# Patient Record
Sex: Female | Born: 1935 | Race: White | Hispanic: No | Smoking: Former smoker
Health system: Southern US, Community
[De-identification: ages and names within clinical notes are randomized; demographics above are authoritative.]

## PROBLEM LIST (undated history)

## (undated) DIAGNOSIS — C569 Malignant neoplasm of unspecified ovary: Secondary | ICD-10-CM

## (undated) DIAGNOSIS — Z87442 Personal history of urinary calculi: Secondary | ICD-10-CM

## (undated) DIAGNOSIS — R011 Cardiac murmur, unspecified: Secondary | ICD-10-CM

## (undated) DIAGNOSIS — I2699 Other pulmonary embolism without acute cor pulmonale: Secondary | ICD-10-CM

## (undated) DIAGNOSIS — F419 Anxiety disorder, unspecified: Secondary | ICD-10-CM

## (undated) DIAGNOSIS — Z950 Presence of cardiac pacemaker: Secondary | ICD-10-CM

## (undated) DIAGNOSIS — Z952 Presence of prosthetic heart valve: Secondary | ICD-10-CM

## (undated) DIAGNOSIS — M1990 Unspecified osteoarthritis, unspecified site: Secondary | ICD-10-CM

## (undated) DIAGNOSIS — I050 Rheumatic mitral stenosis: Secondary | ICD-10-CM

## (undated) DIAGNOSIS — Z9221 Personal history of antineoplastic chemotherapy: Secondary | ICD-10-CM

## (undated) DIAGNOSIS — I10 Essential (primary) hypertension: Secondary | ICD-10-CM

## (undated) DIAGNOSIS — M4800 Spinal stenosis, site unspecified: Secondary | ICD-10-CM

## (undated) DIAGNOSIS — I350 Nonrheumatic aortic (valve) stenosis: Secondary | ICD-10-CM

## (undated) DIAGNOSIS — C50919 Malignant neoplasm of unspecified site of unspecified female breast: Secondary | ICD-10-CM

## (undated) DIAGNOSIS — R0600 Dyspnea, unspecified: Secondary | ICD-10-CM

## (undated) HISTORY — PX: LITHOTRIPSY: SUR834

## (undated) HISTORY — PX: CARDIAC CATHETERIZATION: SHX172

## (undated) HISTORY — DX: Unspecified osteoarthritis, unspecified site: M19.90

## (undated) HISTORY — PX: INSERT / REPLACE / REMOVE PACEMAKER: SUR710

## (undated) HISTORY — PX: TONSILLECTOMY: SUR1361

## (undated) HISTORY — DX: Essential (primary) hypertension: I10

## (undated) HISTORY — DX: Rheumatic mitral stenosis: I05.0

## (undated) HISTORY — DX: Malignant neoplasm of unspecified site of unspecified female breast: C50.919

## (undated) HISTORY — DX: Nonrheumatic aortic (valve) stenosis: I35.0

---

## 1998-12-24 ENCOUNTER — Encounter: Admission: RE | Admit: 1998-12-24 | Discharge: 1998-12-24 | Payer: Self-pay | Admitting: *Deleted

## 1998-12-24 ENCOUNTER — Encounter: Payer: Self-pay | Admitting: *Deleted

## 1999-12-26 ENCOUNTER — Encounter: Admission: RE | Admit: 1999-12-26 | Discharge: 1999-12-26 | Payer: Self-pay | Admitting: *Deleted

## 1999-12-26 ENCOUNTER — Encounter: Payer: Self-pay | Admitting: *Deleted

## 2000-12-28 ENCOUNTER — Encounter: Payer: Self-pay | Admitting: *Deleted

## 2000-12-28 ENCOUNTER — Encounter: Admission: RE | Admit: 2000-12-28 | Discharge: 2000-12-28 | Payer: Self-pay | Admitting: *Deleted

## 2002-01-07 ENCOUNTER — Encounter: Admission: RE | Admit: 2002-01-07 | Discharge: 2002-01-07 | Payer: Self-pay | Admitting: *Deleted

## 2002-01-07 ENCOUNTER — Encounter: Payer: Self-pay | Admitting: *Deleted

## 2003-03-25 ENCOUNTER — Encounter: Admission: RE | Admit: 2003-03-25 | Discharge: 2003-03-25 | Payer: Self-pay | Admitting: *Deleted

## 2003-12-18 ENCOUNTER — Ambulatory Visit (HOSPITAL_BASED_OUTPATIENT_CLINIC_OR_DEPARTMENT_OTHER): Admission: RE | Admit: 2003-12-18 | Discharge: 2003-12-18 | Payer: Self-pay | Admitting: Urology

## 2004-01-11 ENCOUNTER — Encounter: Admission: RE | Admit: 2004-01-11 | Discharge: 2004-02-10 | Payer: Self-pay | Admitting: Internal Medicine

## 2004-01-21 ENCOUNTER — Other Ambulatory Visit: Admission: RE | Admit: 2004-01-21 | Discharge: 2004-01-21 | Payer: Self-pay | Admitting: *Deleted

## 2004-01-25 ENCOUNTER — Encounter: Admission: RE | Admit: 2004-01-25 | Discharge: 2004-01-25 | Payer: Self-pay | Admitting: *Deleted

## 2004-01-25 ENCOUNTER — Encounter (INDEPENDENT_AMBULATORY_CARE_PROVIDER_SITE_OTHER): Payer: Self-pay | Admitting: *Deleted

## 2004-02-04 ENCOUNTER — Encounter: Admission: RE | Admit: 2004-02-04 | Discharge: 2004-02-04 | Payer: Self-pay | Admitting: *Deleted

## 2004-02-05 ENCOUNTER — Encounter: Admission: RE | Admit: 2004-02-05 | Discharge: 2004-02-05 | Payer: Self-pay | Admitting: General Surgery

## 2004-02-18 ENCOUNTER — Encounter: Admission: RE | Admit: 2004-02-18 | Discharge: 2004-02-18 | Payer: Self-pay | Admitting: General Surgery

## 2004-03-06 HISTORY — PX: BREAST SURGERY: SHX581

## 2004-03-22 ENCOUNTER — Encounter: Admission: RE | Admit: 2004-03-22 | Discharge: 2004-03-22 | Payer: Self-pay | Admitting: General Surgery

## 2004-03-24 ENCOUNTER — Ambulatory Visit (HOSPITAL_COMMUNITY): Admission: RE | Admit: 2004-03-24 | Discharge: 2004-03-24 | Payer: Self-pay | Admitting: General Surgery

## 2004-03-24 ENCOUNTER — Encounter (INDEPENDENT_AMBULATORY_CARE_PROVIDER_SITE_OTHER): Payer: Self-pay | Admitting: *Deleted

## 2004-03-24 ENCOUNTER — Ambulatory Visit (HOSPITAL_BASED_OUTPATIENT_CLINIC_OR_DEPARTMENT_OTHER): Admission: RE | Admit: 2004-03-24 | Discharge: 2004-03-24 | Payer: Self-pay | Admitting: General Surgery

## 2004-04-04 ENCOUNTER — Ambulatory Visit: Payer: Self-pay | Admitting: Oncology

## 2004-04-14 ENCOUNTER — Ambulatory Visit (HOSPITAL_COMMUNITY): Admission: RE | Admit: 2004-04-14 | Discharge: 2004-04-14 | Payer: Self-pay | Admitting: Oncology

## 2004-05-03 ENCOUNTER — Ambulatory Visit (HOSPITAL_COMMUNITY): Admission: RE | Admit: 2004-05-03 | Discharge: 2004-05-03 | Payer: Self-pay | Admitting: Gastroenterology

## 2004-05-03 ENCOUNTER — Encounter (INDEPENDENT_AMBULATORY_CARE_PROVIDER_SITE_OTHER): Payer: Self-pay | Admitting: *Deleted

## 2004-05-03 ENCOUNTER — Ambulatory Visit: Admission: RE | Admit: 2004-05-03 | Discharge: 2004-07-03 | Payer: Self-pay | Admitting: Radiation Oncology

## 2004-05-05 ENCOUNTER — Encounter: Admission: RE | Admit: 2004-05-05 | Discharge: 2004-08-03 | Payer: Self-pay | Admitting: Internal Medicine

## 2004-05-12 ENCOUNTER — Ambulatory Visit (HOSPITAL_COMMUNITY): Admission: RE | Admit: 2004-05-12 | Discharge: 2004-05-12 | Payer: Self-pay | Admitting: Urology

## 2004-06-23 ENCOUNTER — Ambulatory Visit: Payer: Self-pay | Admitting: Oncology

## 2004-06-30 ENCOUNTER — Ambulatory Visit (HOSPITAL_COMMUNITY): Admission: RE | Admit: 2004-06-30 | Discharge: 2004-06-30 | Payer: Self-pay | Admitting: Urology

## 2004-07-22 ENCOUNTER — Ambulatory Visit (HOSPITAL_COMMUNITY): Admission: RE | Admit: 2004-07-22 | Discharge: 2004-07-22 | Payer: Self-pay | Admitting: Urology

## 2004-07-22 ENCOUNTER — Ambulatory Visit (HOSPITAL_BASED_OUTPATIENT_CLINIC_OR_DEPARTMENT_OTHER): Admission: RE | Admit: 2004-07-22 | Discharge: 2004-07-22 | Payer: Self-pay | Admitting: Urology

## 2004-07-27 ENCOUNTER — Ambulatory Visit: Admission: RE | Admit: 2004-07-27 | Discharge: 2004-07-27 | Payer: Self-pay | Admitting: Radiation Oncology

## 2004-09-16 ENCOUNTER — Ambulatory Visit: Payer: Self-pay | Admitting: Oncology

## 2004-10-03 ENCOUNTER — Ambulatory Visit (HOSPITAL_COMMUNITY): Admission: RE | Admit: 2004-10-03 | Discharge: 2004-10-03 | Payer: Self-pay | Admitting: Urology

## 2004-10-03 ENCOUNTER — Ambulatory Visit (HOSPITAL_BASED_OUTPATIENT_CLINIC_OR_DEPARTMENT_OTHER): Admission: RE | Admit: 2004-10-03 | Discharge: 2004-10-03 | Payer: Self-pay | Admitting: Urology

## 2004-12-13 ENCOUNTER — Encounter: Admission: RE | Admit: 2004-12-13 | Discharge: 2004-12-13 | Payer: Self-pay | Admitting: Oncology

## 2005-01-13 ENCOUNTER — Ambulatory Visit: Payer: Self-pay | Admitting: Oncology

## 2005-02-23 ENCOUNTER — Other Ambulatory Visit: Admission: RE | Admit: 2005-02-23 | Discharge: 2005-02-23 | Payer: Self-pay | Admitting: *Deleted

## 2005-05-17 ENCOUNTER — Ambulatory Visit: Payer: Self-pay | Admitting: Oncology

## 2005-06-10 ENCOUNTER — Emergency Department (HOSPITAL_COMMUNITY): Admission: EM | Admit: 2005-06-10 | Discharge: 2005-06-10 | Payer: Self-pay | Admitting: Emergency Medicine

## 2005-07-04 ENCOUNTER — Ambulatory Visit: Payer: Self-pay | Admitting: Oncology

## 2005-08-30 ENCOUNTER — Ambulatory Visit: Payer: Self-pay | Admitting: Oncology

## 2005-09-07 LAB — CBC WITH DIFFERENTIAL/PLATELET
Eosinophils Absolute: 0.2 10*3/uL (ref 0.0–0.5)
LYMPH%: 34.9 % (ref 14.0–48.0)
MCH: 31.1 pg (ref 26.0–34.0)
MCHC: 34.1 g/dL (ref 32.0–36.0)
MCV: 91 fL (ref 81.0–101.0)
MONO%: 10.9 % (ref 0.0–13.0)
NEUT#: 3 10*3/uL (ref 1.5–6.5)
Platelets: 220 10*3/uL (ref 145–400)
RBC: 4.19 10*6/uL (ref 3.70–5.32)

## 2005-09-07 LAB — COMPREHENSIVE METABOLIC PANEL
ALT: 21 U/L (ref 0–40)
BUN: 21 mg/dL (ref 6–23)
CO2: 23 mEq/L (ref 19–32)
Calcium: 9.3 mg/dL (ref 8.4–10.5)
Chloride: 107 mEq/L (ref 96–112)
Creatinine, Ser: 0.71 mg/dL (ref 0.40–1.20)
Total Bilirubin: 0.4 mg/dL (ref 0.3–1.2)

## 2005-09-07 LAB — CANCER ANTIGEN 27.29: CA 27.29: 23 U/mL (ref 0–39)

## 2005-12-14 ENCOUNTER — Encounter: Admission: RE | Admit: 2005-12-14 | Discharge: 2005-12-14 | Payer: Self-pay | Admitting: Oncology

## 2006-01-17 ENCOUNTER — Ambulatory Visit: Payer: Self-pay | Admitting: Oncology

## 2006-01-19 LAB — COMPREHENSIVE METABOLIC PANEL
ALT: 29 U/L (ref 0–35)
AST: 23 U/L (ref 0–37)
Albumin: 4.5 g/dL (ref 3.5–5.2)
Calcium: 9.6 mg/dL (ref 8.4–10.5)
Chloride: 105 mEq/L (ref 96–112)
Potassium: 3.8 mEq/L (ref 3.5–5.3)
Sodium: 140 mEq/L (ref 135–145)
Total Protein: 7 g/dL (ref 6.0–8.3)

## 2006-01-19 LAB — CBC WITH DIFFERENTIAL/PLATELET
BASO%: 0.4 % (ref 0.0–2.0)
EOS%: 3.5 % (ref 0.0–7.0)
HGB: 13.4 g/dL (ref 11.6–15.9)
MCH: 31.8 pg (ref 26.0–34.0)
MCHC: 34.5 g/dL (ref 32.0–36.0)
MONO#: 0.6 10*3/uL (ref 0.1–0.9)
RDW: 13.1 % (ref 11.3–14.5)
WBC: 5.6 10*3/uL (ref 3.9–10.0)
lymph#: 1.9 10*3/uL (ref 0.9–3.3)

## 2006-04-13 ENCOUNTER — Other Ambulatory Visit: Admission: RE | Admit: 2006-04-13 | Discharge: 2006-04-13 | Payer: Self-pay | Admitting: *Deleted

## 2006-08-01 ENCOUNTER — Ambulatory Visit: Payer: Self-pay | Admitting: Oncology

## 2006-08-03 LAB — COMPREHENSIVE METABOLIC PANEL
Albumin: 4.2 g/dL (ref 3.5–5.2)
Alkaline Phosphatase: 75 U/L (ref 39–117)
BUN: 18 mg/dL (ref 6–23)
CO2: 25 mEq/L (ref 19–32)
Calcium: 9.7 mg/dL (ref 8.4–10.5)
Chloride: 103 mEq/L (ref 96–112)
Glucose, Bld: 169 mg/dL — ABNORMAL HIGH (ref 70–99)
Potassium: 3.9 mEq/L (ref 3.5–5.3)
Sodium: 141 mEq/L (ref 135–145)
Total Protein: 6.8 g/dL (ref 6.0–8.3)

## 2006-08-03 LAB — CBC WITH DIFFERENTIAL/PLATELET
Basophils Absolute: 0 10*3/uL (ref 0.0–0.1)
Eosinophils Absolute: 0.2 10*3/uL (ref 0.0–0.5)
HGB: 13.4 g/dL (ref 11.6–15.9)
MCV: 91.4 fL (ref 81.0–101.0)
MONO#: 0.8 10*3/uL (ref 0.1–0.9)
MONO%: 10.9 % (ref 0.0–13.0)
NEUT#: 3.8 10*3/uL (ref 1.5–6.5)
RBC: 4.19 10*6/uL (ref 3.70–5.32)
RDW: 13.5 % (ref 11.3–14.5)
WBC: 7.3 10*3/uL (ref 3.9–10.0)
lymph#: 2.4 10*3/uL (ref 0.9–3.3)

## 2006-08-03 LAB — LACTATE DEHYDROGENASE: LDH: 209 U/L (ref 94–250)

## 2006-12-17 ENCOUNTER — Encounter: Admission: RE | Admit: 2006-12-17 | Discharge: 2006-12-17 | Payer: Self-pay | Admitting: Oncology

## 2006-12-21 ENCOUNTER — Encounter: Admission: RE | Admit: 2006-12-21 | Discharge: 2006-12-21 | Payer: Self-pay | Admitting: Oncology

## 2007-01-28 ENCOUNTER — Ambulatory Visit: Payer: Self-pay | Admitting: Oncology

## 2007-02-01 LAB — COMPREHENSIVE METABOLIC PANEL
Albumin: 4.1 g/dL (ref 3.5–5.2)
CO2: 25 mEq/L (ref 19–32)
Chloride: 106 mEq/L (ref 96–112)
Glucose, Bld: 202 mg/dL — ABNORMAL HIGH (ref 70–99)
Potassium: 4.4 mEq/L (ref 3.5–5.3)
Sodium: 142 mEq/L (ref 135–145)
Total Protein: 6.3 g/dL (ref 6.0–8.3)

## 2007-02-01 LAB — CBC WITH DIFFERENTIAL/PLATELET
Eosinophils Absolute: 0.4 10*3/uL (ref 0.0–0.5)
LYMPH%: 32.8 % (ref 14.0–48.0)
MONO#: 1 10*3/uL — ABNORMAL HIGH (ref 0.1–0.9)
NEUT#: 3.8 10*3/uL (ref 1.5–6.5)
Platelets: 238 10*3/uL (ref 145–400)
RBC: 4.18 10*6/uL (ref 3.70–5.32)
RDW: 13.1 % (ref 11.3–14.5)
WBC: 7.7 10*3/uL (ref 3.9–10.0)
lymph#: 2.5 10*3/uL (ref 0.9–3.3)

## 2007-02-01 LAB — CANCER ANTIGEN 27.29: CA 27.29: 15 U/mL (ref 0–39)

## 2007-02-01 LAB — LACTATE DEHYDROGENASE: LDH: 198 U/L (ref 94–250)

## 2007-04-18 ENCOUNTER — Other Ambulatory Visit: Admission: RE | Admit: 2007-04-18 | Discharge: 2007-04-18 | Payer: Self-pay | Admitting: Family Medicine

## 2007-07-31 ENCOUNTER — Ambulatory Visit: Payer: Self-pay | Admitting: Oncology

## 2007-12-18 ENCOUNTER — Encounter: Admission: RE | Admit: 2007-12-18 | Discharge: 2007-12-18 | Payer: Self-pay | Admitting: Oncology

## 2007-12-28 ENCOUNTER — Observation Stay (HOSPITAL_COMMUNITY): Admission: EM | Admit: 2007-12-28 | Discharge: 2007-12-29 | Payer: Self-pay | Admitting: Emergency Medicine

## 2008-01-15 ENCOUNTER — Ambulatory Visit (HOSPITAL_COMMUNITY): Admission: RE | Admit: 2008-01-15 | Discharge: 2008-01-16 | Payer: Self-pay | Admitting: Urology

## 2008-01-27 ENCOUNTER — Ambulatory Visit (HOSPITAL_COMMUNITY): Admission: RE | Admit: 2008-01-27 | Discharge: 2008-01-27 | Payer: Self-pay | Admitting: Urology

## 2008-02-12 ENCOUNTER — Ambulatory Visit (HOSPITAL_COMMUNITY): Admission: RE | Admit: 2008-02-12 | Discharge: 2008-02-13 | Payer: Self-pay | Admitting: Urology

## 2008-02-14 ENCOUNTER — Ambulatory Visit: Payer: Self-pay | Admitting: Oncology

## 2008-02-18 LAB — CBC WITH DIFFERENTIAL/PLATELET
Basophils Absolute: 0 10*3/uL (ref 0.0–0.1)
EOS%: 6.9 % (ref 0.0–7.0)
HCT: 32.5 % — ABNORMAL LOW (ref 34.8–46.6)
HGB: 11.1 g/dL — ABNORMAL LOW (ref 11.6–15.9)
LYMPH%: 34.6 % (ref 14.0–48.0)
MCH: 30.4 pg (ref 26.0–34.0)
MCV: 89.2 fL (ref 81.0–101.0)
MONO%: 11.7 % (ref 0.0–13.0)
NEUT%: 46.1 % (ref 39.6–76.8)
Platelets: 271 10*3/uL (ref 145–400)
RDW: 13.6 % (ref 11.3–14.5)

## 2008-02-19 LAB — CANCER ANTIGEN 27.29: CA 27.29: 14 U/mL (ref 0–39)

## 2008-02-19 LAB — COMPREHENSIVE METABOLIC PANEL
AST: 17 U/L (ref 0–37)
Alkaline Phosphatase: 61 U/L (ref 39–117)
BUN: 13 mg/dL (ref 6–23)
Creatinine, Ser: 0.81 mg/dL (ref 0.40–1.20)
Total Bilirubin: 0.9 mg/dL (ref 0.3–1.2)

## 2008-02-19 LAB — VITAMIN D 25 HYDROXY (VIT D DEFICIENCY, FRACTURES): Vit D, 25-Hydroxy: 32 ng/mL (ref 30–89)

## 2008-03-09 ENCOUNTER — Ambulatory Visit (HOSPITAL_COMMUNITY): Admission: RE | Admit: 2008-03-09 | Discharge: 2008-03-09 | Payer: Self-pay | Admitting: Urology

## 2008-09-15 ENCOUNTER — Ambulatory Visit: Payer: Self-pay | Admitting: Oncology

## 2008-09-17 LAB — CBC WITH DIFFERENTIAL/PLATELET
BASO%: 0.5 % (ref 0.0–2.0)
EOS%: 3.2 % (ref 0.0–7.0)
HCT: 38.3 % (ref 34.8–46.6)
MCH: 32.2 pg (ref 25.1–34.0)
MCHC: 34.8 g/dL (ref 31.5–36.0)
NEUT%: 43.1 % (ref 38.4–76.8)
RBC: 4.14 10*6/uL (ref 3.70–5.45)
lymph#: 2.8 10*3/uL (ref 0.9–3.3)

## 2008-09-18 LAB — COMPREHENSIVE METABOLIC PANEL
AST: 38 U/L — ABNORMAL HIGH (ref 0–37)
Albumin: 4.2 g/dL (ref 3.5–5.2)
Alkaline Phosphatase: 84 U/L (ref 39–117)
BUN: 13 mg/dL (ref 6–23)
Creatinine, Ser: 0.77 mg/dL (ref 0.40–1.20)
Potassium: 4 mEq/L (ref 3.5–5.3)

## 2008-12-18 ENCOUNTER — Encounter: Admission: RE | Admit: 2008-12-18 | Discharge: 2008-12-18 | Payer: Self-pay | Admitting: Oncology

## 2009-03-19 ENCOUNTER — Ambulatory Visit: Payer: Self-pay | Admitting: Oncology

## 2009-03-23 LAB — CBC WITH DIFFERENTIAL/PLATELET
Basophils Absolute: 0 10*3/uL (ref 0.0–0.1)
EOS%: 2.8 % (ref 0.0–7.0)
Eosinophils Absolute: 0.2 10*3/uL (ref 0.0–0.5)
HCT: 41.7 % (ref 34.8–46.6)
HGB: 14.2 g/dL (ref 11.6–15.9)
MCH: 31.9 pg (ref 25.1–34.0)
MCHC: 34 g/dL (ref 31.5–36.0)
MCV: 93.8 fL (ref 79.5–101.0)
MONO%: 11.2 % (ref 0.0–14.0)
RBC: 4.45 10*6/uL (ref 3.70–5.45)
RDW: 13.5 % (ref 11.2–14.5)

## 2009-03-23 LAB — VITAMIN D 25 HYDROXY (VIT D DEFICIENCY, FRACTURES): Vit D, 25-Hydroxy: 36 ng/mL (ref 30–89)

## 2009-03-23 LAB — COMPREHENSIVE METABOLIC PANEL
Albumin: 4.6 g/dL (ref 3.5–5.2)
Alkaline Phosphatase: 90 U/L (ref 39–117)
BUN: 17 mg/dL (ref 6–23)
Calcium: 10.1 mg/dL (ref 8.4–10.5)
Creatinine, Ser: 0.89 mg/dL (ref 0.40–1.20)
Total Bilirubin: 0.7 mg/dL (ref 0.3–1.2)
Total Protein: 7.3 g/dL (ref 6.0–8.3)

## 2009-05-20 ENCOUNTER — Other Ambulatory Visit: Admission: RE | Admit: 2009-05-20 | Discharge: 2009-05-20 | Payer: Self-pay | Admitting: Obstetrics and Gynecology

## 2009-11-29 ENCOUNTER — Encounter
Admission: RE | Admit: 2009-11-29 | Discharge: 2010-02-27 | Payer: Self-pay | Source: Home / Self Care | Attending: Family Medicine | Admitting: Family Medicine

## 2009-12-20 ENCOUNTER — Encounter: Admission: RE | Admit: 2009-12-20 | Discharge: 2009-12-20 | Payer: Self-pay | Admitting: Oncology

## 2010-03-21 ENCOUNTER — Ambulatory Visit: Payer: Self-pay | Admitting: Oncology

## 2010-03-23 LAB — CBC WITH DIFFERENTIAL/PLATELET
BASO%: 0.3 % (ref 0.0–2.0)
Basophils Absolute: 0 10*3/uL (ref 0.0–0.1)
EOS%: 4 % (ref 0.0–7.0)
Eosinophils Absolute: 0.3 10*3/uL (ref 0.0–0.5)
HCT: 39.5 % (ref 34.8–46.6)
HGB: 13.5 g/dL (ref 11.6–15.9)
LYMPH%: 36.5 % (ref 14.0–49.7)
MCH: 32.1 pg (ref 25.1–34.0)
MCHC: 34.2 g/dL (ref 31.5–36.0)
MCV: 93.9 fL (ref 79.5–101.0)
MONO#: 0.9 10*3/uL (ref 0.1–0.9)
MONO%: 11.5 % (ref 0.0–14.0)
NEUT#: 3.7 10*3/uL (ref 1.5–6.5)
NEUT%: 47.7 % (ref 38.4–76.8)
Platelets: 195 10*3/uL (ref 145–400)
RBC: 4.21 10*6/uL (ref 3.70–5.45)
RDW: 13.7 % (ref 11.2–14.5)
WBC: 7.7 10*3/uL (ref 3.9–10.3)
lymph#: 2.8 10*3/uL (ref 0.9–3.3)

## 2010-03-24 LAB — COMPREHENSIVE METABOLIC PANEL
ALT: 29 U/L (ref 0–35)
AST: 28 U/L (ref 0–37)
Albumin: 4.3 g/dL (ref 3.5–5.2)
Alkaline Phosphatase: 79 U/L (ref 39–117)
BUN: 15 mg/dL (ref 6–23)
CO2: 27 mEq/L (ref 19–32)
Calcium: 9.5 mg/dL (ref 8.4–10.5)
Chloride: 106 mEq/L (ref 96–112)
Creatinine, Ser: 0.8 mg/dL (ref 0.40–1.20)
Glucose, Bld: 134 mg/dL — ABNORMAL HIGH (ref 70–99)
Potassium: 3.9 mEq/L (ref 3.5–5.3)
Sodium: 143 mEq/L (ref 135–145)
Total Bilirubin: 0.7 mg/dL (ref 0.3–1.2)
Total Protein: 6.7 g/dL (ref 6.0–8.3)

## 2010-03-24 LAB — LACTATE DEHYDROGENASE: LDH: 189 U/L (ref 94–250)

## 2010-03-24 LAB — VITAMIN D 25 HYDROXY (VIT D DEFICIENCY, FRACTURES): Vit D, 25-Hydroxy: 38 ng/mL (ref 30–89)

## 2010-03-24 LAB — CANCER ANTIGEN 27.29: CA 27.29: 17 U/mL (ref 0–39)

## 2010-03-26 ENCOUNTER — Encounter: Payer: Self-pay | Admitting: *Deleted

## 2010-04-11 ENCOUNTER — Encounter (HOSPITAL_BASED_OUTPATIENT_CLINIC_OR_DEPARTMENT_OTHER): Payer: Self-pay | Admitting: Oncology

## 2010-04-11 ENCOUNTER — Other Ambulatory Visit: Payer: Self-pay | Admitting: Oncology

## 2010-04-11 DIAGNOSIS — E559 Vitamin D deficiency, unspecified: Secondary | ICD-10-CM

## 2010-04-11 DIAGNOSIS — Z170 Estrogen receptor positive status [ER+]: Secondary | ICD-10-CM

## 2010-04-11 DIAGNOSIS — C50219 Malignant neoplasm of upper-inner quadrant of unspecified female breast: Secondary | ICD-10-CM

## 2010-04-11 DIAGNOSIS — Z9889 Other specified postprocedural states: Secondary | ICD-10-CM

## 2010-04-11 DIAGNOSIS — Z853 Personal history of malignant neoplasm of breast: Secondary | ICD-10-CM

## 2010-06-20 LAB — GLUCOSE, CAPILLARY: Glucose-Capillary: 197 mg/dL — ABNORMAL HIGH (ref 70–99)

## 2010-07-19 NOTE — Op Note (Signed)
[name], [name]               ACCOUNT NO.:  [id number]   MEDICAL RECORD NO.:  [id number]          PATIENT TYPE:  OBV   LOCATION:  0098                         FACILITY:  [facility]   PHYSICIAN:  Excell Seltzer. Annabell Howells, M.D.    DATE OF BIRTH:  04/30/1935   DATE OF PROCEDURE:  12/28/2007  DATE OF DISCHARGE:                               OPERATIVE REPORT   PROCEDURE:  Cystoscopy, right retrograde pyelogram with interpretation,  insertion of right double-J stent.   PREOPERATIVE DIAGNOSIS:  Right ureteropelvic junction stone with urinary  tract infection.   POSTOPERATIVE DIAGNOSIS:  Right ureteropelvic junction stone with  urinary tract infection with pyelonephrosis.   ANESTHESIA:  General.   DRAINS:  6-French x 26 cm double-J stent.   COMPLICATIONS:  None.   INDICATIONS:  [name] is a 75 year old white female, patient of Dr.  Vernie Ammons, with a history of stones who presented to the emergency room  today with the onset yesterday of severe right flank pain.  CT scan in  the emergency room revealed a 2.3 cm right UPJ stone with some  obstruction and several additional stones in the right kidney that have  shown significant interval increase since November 2008.  There were  also two stones in the left kidney.  Her urine was nitrite positive  consistent with urinary tract infection and it was felt that cystoscopy  with stent placement was indicated to decompress the kidney.   FINDINGS AND PROCEDURE:  The patient was taken to the operating room  where a general anesthetic was induced.  She had been given Cipro in the  emergency room.  She was placed in the lithotomy position.  Her perineum  and genitalia were prepped with Betadine solution.  She was draped in  the usual sterile fashion.  Cystoscopy was performed using the 22-French  scope and 12 and 70 degrees lenses.  Examination revealed a normal  urethra.  The bladder wall had mild trabeculation.  The ureteral  orifices were unremarkable.   No tumors or stones were seen.  There was  some suggestion of chronic inflammation.   The right ureteral orifice was cannulated with a 5-French open-end  catheter and contrast was instilled.   The retrograde pyelogram demonstrated a normal ureter to the UPJ where  there was a large filling defect consistent with a stone seen on CT.  Contrast did flow into the kidney and exhibited mild hydronephrosis.     After completion of the retrograde pyelogram a censored dual core  guidewire was passed through the kidney through the open-end catheter  without difficulty.  The open-end catheter was removed and a 6-French 26  cm double-J stent was placed under fluoroscopic guidance to the kidney.  The wire was removed leaving a good coil in the kidney, a good coil in  the bladder.  Upon removal of the wire there was the efflux of bloody  turbid urine consistent  with infection from the stent.  The bladder was then drained, the  cystoscope was removed.  The patient was taken down from the lithotomy  position.  The anesthetic was  reversed.  She was moved to the recovery  room in stable condition.  There were no complications.      Excell Seltzer. Annabell Howells, M.D.  Electronically Signed     JJW/MEDQ  D:  12/28/2007  T:  12/28/2007  Job:  [id number]   cc:   Veverly Fells. Vernie Ammons, M.D.  Fax: [phone]   Dossie Der, MD  Fax: [phone]

## 2010-07-19 NOTE — Op Note (Signed)
[name], [name]                  ACCOUNT NO.:  [id number]   MEDICAL RECORD NO.:  [id number]          PATIENT TYPE:  OIB   LOCATION:  1419                         FACILITY:  [facility]   PHYSICIAN:  Mark C. Vernie Ammons, M.D.  DATE OF BIRTH:  02-09-1936   DATE OF PROCEDURE:  02/12/2008  DATE OF DISCHARGE:                               OPERATIVE REPORT   PREOPERATIVE DIAGNOSIS:  Right renal and ureteral calculi.   POSTOPERATIVE DIAGNOSIS:  Right renal and ureteral calculi.   PROCEDURES:  1. Cystoscopy with double-J stent removal.  2. Right nephroscopy.  3. Nephroscopic stone extraction.  4. Antegrade pyelogram with interpretation.  5. Right ureteroscopy.  6. Ureteroscopic stone extraction.  7. Right double-J stent placement (no string).   SURGEON:  Mark C. Vernie Ammons, M.D.   ANESTHESIA:  General.   SPECIMENS:  None.   BLOOD LOSS:  Approximately 100 mL.   DRAINS:  A 7-French 24-cm double-J stent in the right ureter and a 20-  French catheter as a nephrostomy tube.   COMPLICATIONS:  None.   INDICATIONS:  The patient is a 75 year old white female who had an  obstructing stone requiring a stent due to severe pain.  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  There was some stone in a parallel  calix and this was treated with lithotripsy, with excellent results, and  there were remaining fragments present within the collecting system, so  she is brought back to the operating room for repeat nephroscopy and  removal of those stones.  On her preop KUB I saw stones in the  collecting system as well as some stones along the course along the  stent in the ureter.   DESCRIPTION OF OPERATION:  After informed consent, the patient was taken  to the major OR and placed on the table, administered general anesthesia  and then moved to the prone position.  I removed her nephrostomy tube  and then prepped her flank.   Sterile drapes were applied and I then  performed nephroscopy.   The 17-French flexible cystoscope was used and passed down through the  nephrostomy tract into the kidney, where I was able to identify several  stones and grasped these with the nitinol basket and extracted each  individually.  I was able to then negotiate my way into what appeared to  be the renal pelvis or lower pole calix and found more stone.  As I  tried to grasp the stone fragments, they crushed and could not be easily  extracted.  Because they were so soft, I could not maintain them in the  basket.  I therefore fragmented the stone into sand grain size pieces by  grasping it with the nitinol basket and actually crushing the stone in  that fashion.  I then performed an antegrade nephrostogram.   Full-strength contrast was injected through the cystoscope and I noted  there was a slight amount of extravasation.  I could see the stent in  the lower portion of the renal  pelvis near the UPJ.   I removed the flexible scope and inserted the 22-French rigid cystoscope  and through that I was able to grasp more stone using a three-pronged  grasper.  After I had gotten all the large portions of stone completely  removed,  I removed that and inserted the 20-French Foley catheter under  direct fluoroscopy and filled the balloon with about 2 mL of contrast.  This was then taped into position and my feeling is it may need to be  left for some time in the recovery room in order to tamponade any  bleeding.   The patient was then moved back to the supine position and then moved to  dorsal lithotomy position and her genitalia was sterilely prepped and  draped.  I initially inserted a 22-French rigid scope and identified her  stent, grasped it with the alligator forceps and brought it out through  the urethral meatus.  Under direct fluoroscopy I then passed a guidewire  through the double-J stent and into the renal pelvis and  removed the  double-J stent, leaving the stent as a safety guidewire.  Next, the 6-  French rigid ureteroscope was then passed under direct vision into the  right orifice and alongside the guidewire up the ureter, where multiple  stone fragments were noted.  These were grasped and removed with a  nitinol basket.  I noted also there were multiple stone fragments seen  on the floor of the bladder as well.  I then removed the rigid  ureteroscope and as I did this, I passed a second guidewire and then  over the guidewire passed the flexible ureteroscope.  With the flexible  ureteroscope I could then inspect the kidney and I could not find any  large stones.  There was a lot of gravel-sized/sand grain-sized stone  too small to warrant attempted extraction.  Because there was still a  lot of sand-like material though after I removed a few more ureteral  fragments, I removed the flexible ureteroscope and elected to put in a  new double-J stent.   The safety guidewire was then back loaded on the cystoscope and the 7-  [place] double-J stent was passed over the guidewire into the area of the  renal pelvis, which was confirmed fluoroscopically.  I then removed the  guidewire, with good curl being noted in the renal pelvis and in the  bladder.  The bladder was then drained and the patient was awakened and  taken to the recovery room in stable satisfactory condition.  She  tolerated the procedure well.  There were no intraoperative  complications.  She will be monitored in the recovery room and I will  consider removal of her nephrostomy tube prior to her discharge.  She  will be maintained on Cipro and also has a prescription for pain  medicine and will return to my office in 2 weeks.      Mark C. Vernie Ammons, M.D.  Electronically Signed     MCO/MEDQ  D:  02/12/2008  T:  02/12/2008  Job:  [id number]

## 2010-07-19 NOTE — Consult Note (Signed)
[name], [name]               ACCOUNT NO.:  [id number]   MEDICAL RECORD NO.:  [id number]          PATIENT TYPE:  OBV   LOCATION:  0098                         FACILITY:  [facility]   PHYSICIAN:  Excell Seltzer. Annabell Howells, M.D.    DATE OF BIRTH:  January 05, 1936   DATE OF CONSULTATION:  DATE OF DISCHARGE:                                 CONSULTATION   Patient of Dr. Ihor Gully.   CHIEF COMPLAINT:  Right flank pain.   HISTORY:  [name] is a 75 year old white female patient of Dr. Ihor Gully with a history of stones who was last seen in our office in  November of 2008 at which point she had a single residual right mid  renal stone.  She had the onset last night of severe right flank pain  with nausea and hot flashes without documented fever.  Four to five  weeks ago she was noted to have an elevated glucose, a urine was  positive and she received antibiotics for a week.  She has no dysuria or  malodorous urine.  She denies hematuria or pain, is moderately relieved  with medications in the emergency room.  She had had no prior pain  episodes before last night but had been anxious for the past week.  In  the emergency room a CT scan revealed marked increase in the number of  renal stones with a 2.3 cm UPJ stone and evidence of a forming staghorn  calculus.  There are also some left renal stones noted as well.  Her  urinalysis is nitrite positive with too numerous to count white cells,  zero to two red cells and few bacteria.  White count is 8000 with a left  shift.  Chemistries are unremarkable.   On review of her past history, SHE HAS NO DRUG ALLERGIES.   CURRENT MEDICATIONS:  1. Benicar 40 mg daily.  2. Lipitor 20 mg daily.  3. Metformin 1500 mg in the morning and 1000 mg the evening.  4. Metoprolol 25 mg b.i.d.  5. Arimidex 1 mg q.a.m.  6. Fluoxetine 40 mg q.a.m.  7. Ambien 5 mg as needed.  8. Bayer aspirin 81 mg q.a.m.  9. Potassium chloride that daily.   MEDICAL HISTORY:  1.  Diabetes.  2. Hypertension.  3. History of breast cancer, treated with lumpectomy and radiation.  4. Arthritis.  5. Depression.  6. History of stones.   SURGICAL HISTORY:  1. Lumpectomy.  2. Cystoscopy with ureteroscopy on the right.  3. Lithotripsy x2.   SOCIAL HISTORY:  Negative for tobacco or alcohol.   FAMILY HISTORY:  Noncontributory.   A complete consistent review of systems was performed.  She denies fever  or chills but does have hot flushing.  She denies chest pain but did  have some shortness of breath with the pain.  She denies edema.  She  denies diarrhea or constipation.  She has had some mixed incontinence  with primary urge component.  She has chronic sinus congestion with  headache.  She denies weight loss.  She does have a history of diabetic  peripheral neuropathy with some decreased sensation in her fingers and  feet.  She has joint pain with her arthritis.  She is slightly  diaphoretic today.   EXAM:  Her blood pressure is 161/72, heart rate 95, respirations 20,  temperature 99.4.  GENERAL:  She is a well-developed, well-nourished, obese white female in  no acute distress, alert and oriented x3.  HEAD AND FACE:  Normocephalic/atraumatic.  NECK:  Supple without masses, she does have a buffalo hump.  LUNGS:  Clear with normal effort.  HEART:  Regular rate and rhythm.  ABDOMEN:  Soft, obese, with right CVA tenderness and right upper  quadrant tenderness without mass or hepatosplenomegaly, no hernias are  noted.  She has no inguinal adenopathy.  GU/RECTAL:  Deferred.  EXTREMITIES:  Have full range of motion without edema.  SKIN:  Warm and moist, she is sweating.  NEUROLOGICALLY:  She has no focal deficits.   I have reviewed her CT films and report, I have also reviewed her ER  labs as noted above.  She does have a gallstone.  I have reviewed her  notes and prior KUBs from my office records from November 2008.   IMPRESSION:  A 2.4 cm obstructing right  ureteropelvic junction stone  with marked increase in stone volume bilaterally and an active urinary  tract infection.   PLAN:  At this point I am going to admit her for IV antibiotics and  placement of a right ureteral stent.  She will eventually need probable  percutaneous nephrolithotomy but since she is on aspirin I do not feel  we can safely pass the perc tube today.  She has received a dose of  Cipro and a urine culture was done in the emergency room.  The risks of  the cystoscopy and stent insertion were explained in detail.      Excell Seltzer. Annabell Howells, M.D.  Electronically Signed     JJW/MEDQ  D:  12/28/2007  T:  12/28/2007  Job:  [id number]   cc:   Juluis Rainier, MD

## 2010-07-19 NOTE — Op Note (Signed)
[name], [name]                  ACCOUNT NO.:  [id number]   MEDICAL RECORD NO.:  [id number]          PATIENT TYPE:  AMB   LOCATION:  DAY                          FACILITY:  [facility]   PHYSICIAN:  Mark C. Vernie Ammons, M.D.  DATE OF BIRTH:  01-09-1936   DATE OF PROCEDURE:  01/15/2008  DATE OF DISCHARGE:                               OPERATIVE REPORT   PREOPERATIVE DIAGNOSIS:  Right staghorn calculus (>7 cm. total).   POSTOPERATIVE DIAGNOSIS:  Right staghorn calculus (>7 cm. total).   PROCEDURE:  Right percutaneous nephrostolithotomy (>2.5 cm.).   SURGEON:  Mark C. Vernie Ammons, M.D.   RADIOLOGIST:  Art A. Hoss, M.D.   ANESTHESIA:  General.   SPECIMENS:  Stone fragment to outside lab.   DRAINS:  A 20-French nephrostomy tube and a 60-French Foley catheter.   BLOOD LOSS:  Approximately 200 mL.   COMPLICATIONS:  None.   INDICATIONS:  The patient is a 75 year old white female who was recently  admitted to the hospital with severe right flank pain and a 2.4 cm.  obstructing right UPJ stone.  In addition, she was found to have a  partial staghorn calculus occupying most of the upper pole measuring  several cm., as well as a large fragment in the renal pelvis and small  fragment in the lower pole.  She has a known history of stones in the  past however, this stone had developed quite abruptly.  She did have  culture proven UTI due to Proteus that was sensitive to ceftriaxone and  Cipro.  She has been on Cipro continuously since her admission.  We  discussed percutaneous nephrostolithotomy, as well as its risks,  complications, alternatives and limitations.  She understands and has  elected to proceed.   DESCRIPTION OF OPERATION:  After informed consent, the patient was  brought to the major OR, placed on the table, administered general  anesthesia then moved to the prone position.  Her flank was sterilely  prepped and draped including the nephrostomy tube that had been placed  by the  radiologist earlier in the day.  He an official time-out was then  performed and Dr. Bonnielee Haff obtained access through the percutaneous  nephrostomy tube, dilated that and placed a access sheath into the  kidney, all under direct fluoroscopy and all dictated by him separately.   Through the nephrostomy sheath a 28-French rigid nephroscope was then  passed I was able to visualize the stone in the renal pelvis.  The stone  was very soft and I attempted to grasp it, but it broke up.  I was able  to retrieve a piece and then I used the Swiss lithoclast to fragment the  stone and extract it simultaneously.  It turned out I just needed to use  the ultrasound and not the hydraulic lithoclast portion of the device  because the stone was so soft.  I was able to completely rid the entire  renal pelvis of stone, as well as what appeared to be the lower pole.  I  then was able to direct the rigid scope cephalad and  could see a portion  of the stone that was in the upper pole, but was unable to grasp it.  I  therefore used the ultrasound to fragment as much of this as I could  gain access to.   Dr. Bonnielee Haff felt that he could obtain access to the upper pole through a  upper pole calix and attempted this.  He was unfortunately unsuccessful  due to what was felt to be some scarring of the infundibulum and the  near complete filling of the upper pole collecting system with stone.  I  therefore directed my attention back to the previous nephrostomy tract  and under direct visualization noted the stent in place which I left  indwelling and removed the safety guidewire.  Then over the working  guidewire and through the nephrostomy sheath a 20-French Councill-tip  catheter was passed into the renal pelvis.  This was confirmed by  injecting half-strength contrast under direct fluoroscopy.  I then  partially filled the balloon in the renal pelvis and left this in place  while removing the nephrostomy sheath and  cutting it along its length to  remove it from the Foley catheter.  The catheter was secured to the skin  with a figure-of-eight 2-0 silk suture and a sterile occlusive dressing  was applied to the skin, as well as a Tegaderm.  The patient was  awakened and taken to the recovery room in stable satisfactory  condition.  She tolerated the procedure well and there were no  intraoperative complications.   She will be observed overnight and maintained on Cipro and Rocephin and  be discharged home in the morning with the nephrostomy tube in.  She  will remain on antibiotics and it would appear that lithotripsy would be  the best way to address the remaining stone especially, out in the  peripheral upper pole calix.  A second nephrostomy will probably be  required as well.      Mark C. Vernie Ammons, M.D.  Electronically Signed     MCO/MEDQ  D:  01/15/2008  T:  01/15/2008  Job:  [id number]

## 2010-07-22 NOTE — Op Note (Signed)
[name], [name]               ACCOUNT NO.:  [id number]   MEDICAL RECORD NO.:  [id number]          PATIENT TYPE:  AMB   LOCATION:  [facility]                         [facility]ACILITY:  [facility]   PHYSICIAN:  Mark C. Vernie Ammons, M.D.  DATE OF BIRTH:  November 26, 1935   DATE OF PROCEDURE:  07/22/2004  DATE OF DISCHARGE:                                 OPERATIVE REPORT   PREOPERATIVE DIAGNOSES:  Left ureteral calculus.   POSTOPERATIVE DIAGNOSES:  Left ureteral calculus.   PROCEDURE:  Cystoscopy, left ureteroscopy, in situ  laser lithotripsy with  stone extraction and double-J stent placement.   SURGEON:  Mark C. Vernie Ammons, M.D.   ANESTHESIA:  General.   DRAINS:  6-French 24 cm double-J stent in the left ureter (no string).   SPECIMENS:  Stone given to the patient.   BLOOD LOSS:  None.   COMPLICATIONS:  None.   INDICATIONS:  This patient is a 75 year old white female who was found on CT  scan to have incidental left hydronephrosis with a stone in the distal  ureter that was previously seen in the left kidney. She never has had any  significant pain. We have elected to proceed with lithotripsy and after the  first treatment there appeared to be some fragmentation, but the  fragmentation appeared incomplete. A repeat lithotripsy was performed still  without significant fragmentation. She therefore is brought to the OR for a  laser lithotripsy. The risks, complications and alternatives have been  discussed. She understands and elects to proceed.   DESCRIPTION OF OPERATION:  After informed consent, the patient was brought  to the major OR, placed on the table, administered general anesthesia and  then moved to the dorsal lithotomy position. Her genitalia was sterilely  prepped and draped and initially a 6-French short rigid ureteroscope was  introduced in the bladder. The left orifice was identified and the scope was  easily passed into the left orifice and the stone was visualized and  photographed in  the distal ureter. A 350 micron laser fiber was then passed  through the scope and used to fragment the stone. I then passed a nitinol  basket through the scope, grasped the stone fragments and extracted these.  . This required multiple passes until the ureter was essentially free of  stone.   A 0.038 inch floppy- tip guidewire was then passed through the ureteroscope  under direct visualization using fluoroscopic control as well and into the  area left renal pelvis. I then removed the ureteroscope, back loaded the  cystoscope and passed a 6-French stent over the guidewire into the renal  pelvis removing the guidewire with good curl being noted in the area of the  renal pelvis and in the bladder. I then fully inspected the bladder.   Cystoscopy revealed the bladder to be free of any tumor, stones or  inflammatory lesions. There were some stone fragments on the floor of the  bladder that were flushed out. I then drained the bladder, removed the  cystoscope and the patient received a B&O suppository as well as intravenous  Toradol and was awakened and taken  to recovery room. She tolerated the  procedure well. There were no intraoperative complications.   She will be given a prescription for Vicodin HP #24 and peridium plus number  36 and follow-up in  my office next week for stent removal.      MCO/MEDQ  D:  07/22/2004  T:  07/22/2004  Job:  [id number]   cc:   Pierce Crane, M.D.  [address] [facility]  [place]  [place] [address]  Fax: [phone]

## 2010-07-22 NOTE — Op Note (Signed)
[name], [name]                  ACCOUNT NO.:  [id number]   MEDICAL RECORD NO.:  [id number]          PATIENT TYPE:  AMB   LOCATION:  ENDO                         FACILITY:  [facility]   PHYSICIAN:  James L. Malon Kindle., M.D.DATE OF BIRTH:  1935/08/21   DATE OF PROCEDURE:  05/03/2004  DATE OF DISCHARGE:                                 OPERATIVE REPORT   PROCEDURE:  Colonoscopy with polypectomy.   MEDICATIONS:  Fentanyl 75 mcg, Versed 7 mg IV.   INDICATIONS FOR PROCEDURE:  Patient with strong family history of colon  cancer.  Sister died of colon cancer at age 79.  The patient herself has  breast cancer and is due to begin chemotherapy following a lumpectomy.   DESCRIPTION OF PROCEDURE:  The procedure had been explained to the patient  and consent obtained.  With the patient in the left lateral decubitus  position, the Olympus scope was inserted and advanced.  The prep was  excellent.  We were able to reach the cecum without difficulty.  The  ileocecal valve and appendiceal orifice were seen.  The scope was withdrawn  in the cecum.  The ascending colon, transverse colon, splenic flexure, and  descending colon were seen well.  In the mid-descending colon, a 0.5-cm  sessile polyp was removed with the snare and sucked through the scope.  No  other polyps were seen in the descending or sigmoid colon.  The scope was  withdrawn.  The patient tolerated the procedure well.  The rectum and  sigmoid were free of polyps, diverticulosis, or any other lesions.   ASSESSMENT:  1.  Descending colon polyp, removed (211.3).  2.  Strong family history of colon cancer (B16.0).   PLAN:  Routine post-polypectomy instructions.  Recommend repeating  colonoscopy in five years with yearly Hemoccults.      JLE/MEDQ  D:  05/03/2004  T:  05/03/2004  Job:  [id number]   cc:   Al Decant. Janey Greaser, MD  [address]  [place]  [place] [address]  Fax: [phone]   Pierce Crane, M.D.  [address] [facility]  [place]  [place] [address]  Fax: [phone]

## 2010-07-22 NOTE — Op Note (Signed)
NAMESUTTON, PLAKE                  ACCOUNT NO.:  [id number]   MEDICAL RECORD NO.:  [id number]          PATIENT TYPE:  AMB   LOCATION:  NESC                         FACILITY:  [facility]   PHYSICIAN:  Mark C. Vernie Ammons, M.D.  DATE OF BIRTH:  08/30/1935   DATE OF PROCEDURE:  12/18/2003  DATE OF DISCHARGE:                                 OPERATIVE REPORT   PREOPERATIVE DIAGNOSIS:  Left distal ureteral calculus.   POSTOPERATIVE DIAGNOSIS:  Same.   PROCEDURE:  Cystoscopy, left ureteroscopy, with laser in situ lithotripsy,  and double-J stent placement.   SURGEON:  Mark C. Vernie Ammons, M.D.   Threasa HeadsVear Clock.   ANESTHESIA:  General.   DRAINS:  A 6 French 24 cm double-J stent in the left ureter (no string).   BLOOD LOSS:  Les than 5 cc.   SPECIMENS:  Stone, given to patient.   COMPLICATIONS:  None.   INDICATIONS:  The patient is a 75 year old white female whom I saw yesterday  in the office with severe left flank pain that had just developed several  days before and persisted.  She had a lot of irritative voiding symptoms.  She was found to have a large left distal ureteral calculus right near the  ureterovesical junction.  It appeared to be too large to pass, and she is  therefore brought to the O.R. today for surgical therapy.  I discussed the  risks, complications, alternatives, and limitations with her.   DESCRIPTION OF OPERATION:  After informed consent, the patient was brought  to the major O.R. and placed on the table, administered general anesthesia,  and moved to the dorsal lithotomy position.  Her genitalia were sterilely  prepped and draped, and initially the cystoscope was introduced into the  bladder.  The bladder was noted to be free of any tumor, stones, or  inflammatory lesions.  There was rather extensive squamous trigonal  metaplasia extending near the right ureteral orifice of no consequence.  Otherwise, there were no tumors or stones within the bladder.  The  left  orifice was noted to be markedly edematous and bulging, likely secondary to  the underlying stone.   The 0.038 inch floppy tip guide wire was then passed into the left ureter  and up the left ureter under fluoroscopic control.  Next the 6 French rigid  ureteroscope was passed into the bladder and into the left orifice.  The  stone was easily visualized, but I thought that I could possibly extract it  with dilatation of the ureteral orifice, so I removed the ureteroscope and  placed a dilating balloon over the guide wire and dilated the ureteral  orifice.  I then reinserted the ureteroscope and attempted to engage the  stone in a basket to extract it, but it was too large.   Using the 350 micron Holmium laser fiber, the stone was then fragmented, and  all the portions of the stone were extracted.  The small bits of stone  remaining in the bladder were removed using the Toomey syringe, and the  cystoscope backloaded over the guide wire.  The double-J stent was then  passed into the area of the renal pelvis and the guide wire removed, with  good curl being noted in the renal pelvis and the bladder.  The bladder was  then drained.  The cystoscope was removed.  The patient received a B & O  suppository, and she was taken to the recovery room in stable satisfactory  condition.  She tolerated the procedure well, with no intraoperative  complications.   She will be given a prescription for 36 Vicodin ES, 24 Pyridium Plus, and  has Enablex 15 mg at home for bladder irritation.  She will follow up in my  office in six to seven days for stent removal.  At that time, she will bring  her stone with her so it can be sent for stone analysis.      MCO/MEDQ  D:  12/18/2003  T:  12/18/2003  Job:  [id number]

## 2010-07-22 NOTE — Op Note (Signed)
[name], [name]                  ACCOUNT NO.:  [id number]   MEDICAL RECORD NO.:  [id number]          PATIENT TYPE:  AMB   LOCATION:  DSC                          FACILITY:  [facility]   PHYSICIAN:  Adolph Pollack, M.D.DATE OF BIRTH:  April 16, 1935   DATE OF PROCEDURE:  03/24/2004  DATE OF DISCHARGE:                                 OPERATIVE REPORT   PREOPERATIVE DIAGNOSIS:  Left breast cancer.   POSTOPERATIVE DIAGNOSIS:  Left breast cancer.   PROCEDURES:  1.  Lymphatic mapping and injection of blue dye into periareolar area of      left breast.  2.  Left axillary sentinel lymph node biopsy (three lymph nodes).  3.  Left partial mastectomy.   SURGEON:  Adolph Pollack, M.D.   ANESTHESIA:  General.   INDICATIONS FOR PROCEDURE:  This 75 year old female was noted to have a left  breast mass by Dr. Janey Greaser on physical examination.  A large core needle  biopsy demonstrated invasive mammary carcinoma.  She now presents for the  above procedure.   DESCRIPTION OF PROCEDURE:  She was seen in the holding area and the left  chest wall marked with my initials.  She was then brought to the operating  room, placed supine on the operating table and a general anesthetic was  administered.  The periareolar area was cleaned with alcohol and then 1.5 mL  of blue dye were injected into the 1 o'clock, 3 o'clock, 6 o'clock and 9  o'clock areas.  This was massaged for five minutes.  Using the Neoprobe, an  area of increased counts was noted in the left axilla and marked.  The left  breast and axilla were then sterilely prepped and draped.   A left axillary incision was made through the skin and subcutaneous tissue  and the axillary area was entered.  The Neoprobe was used to identify an  area of increased counts and a lymph node was identified with I counts.  It  was excised.  The Neoprobe continued to show high counts in the axillary  area and another lymph node was identified.  This node was a  little blue and  it was excised as well.  When evaluating the axillary area with the  Neoprobe, it still demonstrated high counts in a certain region and a third  lymph node was noted and removed.  Following this the counts were baseline.  These three lymph nodes were sent to pathology and Touch prep was negative  for metastatic disease.   This wound was then closed with an interrupted 3-0 Vicryl suture for the  subcutaneous tissue after hemostasis was adequate and a running 4-0 Monocryl  subcuticular stitch.   Following this a curvilinear incision was made in the upper inner quadrant  of the left breast and then turned into an elliptical incision so skin could  be used as an anterior margin.  Flaps were raised in all directions with  electrocautery.  The dissection was taken down to the chest wall in all  directions and the area was excised.  The margins were  marked and sent to  pathology and Touch prep margins were negative.   Following this the breast wound was explored.  Hemostasis was obtained with  cautery and interrupted Vicryl sutures.  The wound was irrigated.  Once  hemostasis was adequate, the subcutaneous tissue was loosely approximated  with interrupted 3-0 Vicryl sutures and the skin was closed with a running 4-  0 Monocryl subcuticular stitch.  Steri-Strips were then applied to both  wounds followed by sterile dressings.   She tolerated the procedure well without any apparent complications and was  taken to the recovery room in satisfactory condition.  She will be given  discharge instructions and an oral analgesic prescription.      TJR/MEDQ  D:  03/24/2004  T:  03/24/2004  Job:  [id number]   cc:   Al Decant. Janey Greaser, MD  [address]  [place]  [place] [address]  Fax: [phone]

## 2010-07-22 NOTE — Op Note (Signed)
[name], [name]               ACCOUNT NO.:  [id number]   MEDICAL RECORD NO.:  [id number]          PATIENT TYPE:  AMB   LOCATION:  NESC                         FACILITY:  [facility]   PHYSICIAN:  Mark C. Vernie Ammons, M.D.  DATE OF BIRTH:  Sep 14, 1935   DATE OF PROCEDURE:  10/03/2004  DATE OF DISCHARGE:                                 OPERATIVE REPORT   PREOPERATIVE DIAGNOSIS:  Right ureteral calculus.   POSTOPERATIVE DIAGNOSIS:  Right ureteral calculus.   PROCEDURE:  Cystoscopy, right retrograde pyelogram with interpretation, and  right ureteroscopy.   SURGEON:  [name]   ANESTHESIA:  General.   BLOOD LOSS:  Zero   SPECIMENS:  None.   DRAINS:  None.   COMPLICATIONS:  None.   INDICATIONS:  The patient is a 75 year old white female, who had a distal  right ureteral calculus.  I had previously treated a left ureteral calculus  on her and had difficulty treating that stone because it was so hard.  She  underwent lithotripsy without success, and I ended up treating it  ureteroscopically.  Because of the difficulty we had, the patient elected to  proceed with ureteroscopy and extraction of her right distal ureteral  calculus.  She has been having some pain recently.  The preop KUB did not  definitively identify the stone that I had previously seen, but I thought it  could be overlying bone due to the angle that the film was taken.  The  patient had not seen the stone pass.  She therefore has elected to proceed  with further evaluation.   DESCRIPTION OF OPERATION:  After informed consent, the patient brought to  the major OR, placed on the table, administered general anesthesia, then  moved to the dorsal lithotomy position.  Genitalia was sterilely prepped and  draped and initially, a 21-French cystoscope was introduced in the bladder.  The bladder was noted to be free of any tumor, stones, or inflammatory  lesions.  Ureteral orifices normal in configuration and position,  however,  the right orifice appeared slightly irritated with some slight increased  erythema and some mild edema.   A 6-French open-ended ureteral catheter was then placed in the opening of  the right ureteral orifice, and a retrograde pyelogram was performed under  direct fluoroscopy.  This revealed a normal caliber ureter throughout, no  hydronephrosis.  No filling defects were seen, although there was an area of  slight narrowing in the distal ureter.  It was not pathologic but appeared  that there could have been a stone previously located there.  The remainder  of the system was noted to be normal.   A 6-French rigid ureteroscope was then easily passed into the right ureteral  orifice and up the right ureter.  I did find a very small submillimeter  stone in the ureter, but no ureteral calculi were identified up to  approximately the mid ureteral region.  I therefore revisualized the ureter  as I removed the ureteroscope; no stone was seen, but some slight irritation  at the ureteral orifice was again noted.  I therefore  removed the  ureteroscope.  The patient was awakened and taken to recovery room in stable  satisfactory condition.  She tolerated the procedure well.  There no  intraoperative complications.   She will follow up with me in the office for a KUB in 6 months and sooner  should she have any difficulty.       MCO/MEDQ  D:  10/03/2004  T:  10/03/2004  Job:  [id number]

## 2010-07-27 ENCOUNTER — Encounter (INDEPENDENT_AMBULATORY_CARE_PROVIDER_SITE_OTHER): Payer: Self-pay | Admitting: General Surgery

## 2010-10-05 ENCOUNTER — Other Ambulatory Visit: Payer: Self-pay | Admitting: Gastroenterology

## 2010-11-28 ENCOUNTER — Other Ambulatory Visit: Payer: Self-pay | Admitting: Obstetrics and Gynecology

## 2010-12-05 LAB — URINE MICROSCOPIC-ADD ON

## 2010-12-05 LAB — URINE CULTURE: Colony Count: >=100000

## 2010-12-05 LAB — BASIC METABOLIC PANEL
BUN: 11
Calcium: 8.8
GFR calc non Af Amer: >60
Glucose, Bld: 210 — ABNORMAL HIGH
Potassium: 4.3
Sodium: 140

## 2010-12-05 LAB — URINALYSIS, ROUTINE W REFLEX MICROSCOPIC
Bilirubin Urine: NEGATIVE
Nitrite: POSITIVE — AB
Specific Gravity, Urine: 1.018
pH: 6.5

## 2010-12-05 LAB — POCT I-STAT, CHEM 8
Chloride: 105
HCT: 40
Hemoglobin: 13.6
Potassium: 3.3 — ABNORMAL LOW
Sodium: 141

## 2010-12-05 LAB — CBC
MCHC: 33.2
Platelets: 172
RBC: 4.36
RDW: 13.4

## 2010-12-05 LAB — DIFFERENTIAL
Basophils Absolute: 0
Basophils Relative: 1
Eosinophils Absolute: 0.1
Neutro Abs: 7
Neutrophils Relative %: 87 — ABNORMAL HIGH

## 2010-12-05 LAB — GLUCOSE, CAPILLARY
Glucose-Capillary: 144 — ABNORMAL HIGH
Glucose-Capillary: 168 — ABNORMAL HIGH
Glucose-Capillary: 240 — ABNORMAL HIGH

## 2010-12-05 LAB — COMPREHENSIVE METABOLIC PANEL
ALT: 28
CO2: 29
Calcium: 8.7
Creatinine, Ser: 0.86
GFR calc Af Amer: >60
GFR calc non Af Amer: >60
Glucose, Bld: 212 — ABNORMAL HIGH
Sodium: 140
Total Protein: 6.1

## 2010-12-05 LAB — PTH, INTACT AND CALCIUM: PTH: 26.8

## 2010-12-06 LAB — BASIC METABOLIC PANEL
BUN: 12
CO2: 28
Calcium: 9.5
Chloride: 109
Creatinine, Ser: 0.77
GFR calc Af Amer: >60
GFR calc non Af Amer: >60
Glucose, Bld: 134 — ABNORMAL HIGH
Potassium: 3.3 — ABNORMAL LOW
Sodium: 146 — ABNORMAL HIGH

## 2010-12-06 LAB — APTT: aPTT: 35

## 2010-12-06 LAB — CBC
HCT: 38.3
Hemoglobin: 13
MCHC: 34
MCV: 93.4
Platelets: 258
RBC: 4.09
RDW: 13.2
WBC: 6.6

## 2010-12-06 LAB — GLUCOSE, CAPILLARY
Glucose-Capillary: 133 — ABNORMAL HIGH
Glucose-Capillary: 137 — ABNORMAL HIGH
Glucose-Capillary: 154 — ABNORMAL HIGH
Glucose-Capillary: 158 — ABNORMAL HIGH
Glucose-Capillary: 167 — ABNORMAL HIGH
Glucose-Capillary: 219 — ABNORMAL HIGH

## 2010-12-06 LAB — PROTIME-INR
INR: 1.2
Prothrombin Time: 15.5 — ABNORMAL HIGH

## 2010-12-06 LAB — HEMOGLOBIN AND HEMATOCRIT, BLOOD
HCT: 32.5 — ABNORMAL LOW
Hemoglobin: 11 — ABNORMAL LOW

## 2010-12-09 LAB — HEMOGLOBIN AND HEMATOCRIT, BLOOD
HCT: 38.8 % (ref 36.0–46.0)
Hemoglobin: 12.7 g/dL (ref 12.0–15.0)

## 2010-12-09 LAB — BASIC METABOLIC PANEL
BUN: 10 mg/dL (ref 6–23)
BUN: 11 mg/dL (ref 6–23)
CO2: 25 mEq/L (ref 19–32)
CO2: 27 mEq/L (ref 19–32)
Calcium: 8.3 mg/dL — ABNORMAL LOW (ref 8.4–10.5)
Calcium: 9.2 mg/dL (ref 8.4–10.5)
Chloride: 107 mEq/L (ref 96–112)
Chloride: 111 mEq/L (ref 96–112)
Creatinine, Ser: 0.74 mg/dL (ref 0.4–1.2)
Creatinine, Ser: 0.75 mg/dL (ref 0.4–1.2)
GFR calc Af Amer: >60 mL/min (ref >60)
GFR calc Af Amer: >60 mL/min (ref >60)
GFR calc non Af Amer: >60 mL/min (ref >60)
GFR calc non Af Amer: >60 mL/min (ref >60)
Glucose, Bld: 152 mg/dL — ABNORMAL HIGH (ref 70–99)
Glucose, Bld: 183 mg/dL — ABNORMAL HIGH (ref 70–99)
Potassium: 3.4 mEq/L — ABNORMAL LOW (ref 3.5–5.1)
Potassium: 3.6 mEq/L (ref 3.5–5.1)
Sodium: 142 mEq/L (ref 135–145)
Sodium: 143 mEq/L (ref 135–145)

## 2010-12-09 LAB — GLUCOSE, CAPILLARY
Glucose-Capillary: 144 mg/dL — ABNORMAL HIGH (ref 70–99)
Glucose-Capillary: 174 mg/dL — ABNORMAL HIGH (ref 70–99)
Glucose-Capillary: 187 mg/dL — ABNORMAL HIGH (ref 70–99)
Glucose-Capillary: 227 mg/dL — ABNORMAL HIGH (ref 70–99)

## 2010-12-09 LAB — GENTAMICIN LEVEL, RANDOM: Gentamicin Rm: 2.3 ug/mL

## 2010-12-23 ENCOUNTER — Encounter (INDEPENDENT_AMBULATORY_CARE_PROVIDER_SITE_OTHER): Payer: Self-pay

## 2010-12-26 ENCOUNTER — Ambulatory Visit
Admission: RE | Admit: 2010-12-26 | Discharge: 2010-12-26 | Disposition: A | Discharge status: Home / Self Care | Payer: Medicare Other | Source: Ambulatory Visit | Attending: Oncology | Admitting: Oncology

## 2010-12-26 DIAGNOSIS — Z853 Personal history of malignant neoplasm of breast: Secondary | ICD-10-CM

## 2010-12-27 ENCOUNTER — Encounter (INDEPENDENT_AMBULATORY_CARE_PROVIDER_SITE_OTHER): Payer: Self-pay | Admitting: General Surgery

## 2010-12-27 ENCOUNTER — Ambulatory Visit (INDEPENDENT_AMBULATORY_CARE_PROVIDER_SITE_OTHER): Payer: Medicare Other | Admitting: General Surgery

## 2010-12-27 VITALS — BP 136/70 | HR 64 | Temp 96.7°F | Resp 20 | Ht 67.5 in | Wt 295.0 lb

## 2010-12-27 DIAGNOSIS — Z853 Personal history of malignant neoplasm of breast: Secondary | ICD-10-CM | POA: Insufficient documentation

## 2010-12-27 DIAGNOSIS — C50919 Malignant neoplasm of unspecified site of unspecified female breast: Secondary | ICD-10-CM

## 2010-12-27 NOTE — Progress Notes (Signed)
Operation: Left partial mastectomy and sentinel lymph node biopsy  Date: January 2006  Stage:  T2N0  Hormone receptor status:  Positive.  HPI:  [name] is here for long-term followup of her left breast cancer. She is doing well overall. She denies any new masses in her breasts. No nipple discharge or adenopathy. Mammogram was done yesterday demonstrates no radiographic evidence for malignancy.  PE: General-old white female no acute distress.  Right breast-soft, no dominant mass or suspicious skin change or nipple discharge present.  Left breast-well-healed superior scar was some indentation. No palpable mass.  Lymph nodes-no palpable axillary, supraclavicular, or cervical adenopathy.  Assessment: Left breast cancer-no clinical or radiographic evidence of recurrent  Plan: Term long-term followup over to Dr. Donnie Coffin and see her back p.r.n.

## 2011-03-07 DIAGNOSIS — I2699 Other pulmonary embolism without acute cor pulmonale: Secondary | ICD-10-CM

## 2011-03-07 HISTORY — DX: Other pulmonary embolism without acute cor pulmonale: I26.99

## 2011-03-07 HISTORY — PX: ABDOMINAL HYSTERECTOMY: SHX81

## 2011-03-09 DIAGNOSIS — Z5111 Encounter for antineoplastic chemotherapy: Secondary | ICD-10-CM | POA: Diagnosis not present

## 2011-03-09 DIAGNOSIS — C569 Malignant neoplasm of unspecified ovary: Secondary | ICD-10-CM | POA: Diagnosis not present

## 2011-03-16 DIAGNOSIS — Z794 Long term (current) use of insulin: Secondary | ICD-10-CM | POA: Diagnosis not present

## 2011-03-16 DIAGNOSIS — Z806 Family history of leukemia: Secondary | ICD-10-CM | POA: Diagnosis not present

## 2011-03-16 DIAGNOSIS — Z87891 Personal history of nicotine dependence: Secondary | ICD-10-CM | POA: Diagnosis not present

## 2011-03-16 DIAGNOSIS — Z8052 Family history of malignant neoplasm of bladder: Secondary | ICD-10-CM | POA: Diagnosis not present

## 2011-03-16 DIAGNOSIS — E785 Hyperlipidemia, unspecified: Secondary | ICD-10-CM | POA: Diagnosis not present

## 2011-03-16 DIAGNOSIS — F39 Unspecified mood [affective] disorder: Secondary | ICD-10-CM | POA: Diagnosis not present

## 2011-03-16 DIAGNOSIS — E1142 Type 2 diabetes mellitus with diabetic polyneuropathy: Secondary | ICD-10-CM | POA: Diagnosis not present

## 2011-03-16 DIAGNOSIS — Z7982 Long term (current) use of aspirin: Secondary | ICD-10-CM | POA: Diagnosis not present

## 2011-03-16 DIAGNOSIS — Z800 Family history of malignant neoplasm of digestive organs: Secondary | ICD-10-CM | POA: Diagnosis not present

## 2011-03-16 DIAGNOSIS — C569 Malignant neoplasm of unspecified ovary: Secondary | ICD-10-CM | POA: Diagnosis not present

## 2011-03-16 DIAGNOSIS — E1149 Type 2 diabetes mellitus with other diabetic neurological complication: Secondary | ICD-10-CM | POA: Diagnosis not present

## 2011-03-21 ENCOUNTER — Encounter: Payer: Self-pay | Admitting: Oncology

## 2011-03-23 DIAGNOSIS — E785 Hyperlipidemia, unspecified: Secondary | ICD-10-CM | POA: Diagnosis not present

## 2011-03-23 DIAGNOSIS — Z8052 Family history of malignant neoplasm of bladder: Secondary | ICD-10-CM | POA: Diagnosis not present

## 2011-03-23 DIAGNOSIS — C569 Malignant neoplasm of unspecified ovary: Secondary | ICD-10-CM | POA: Diagnosis not present

## 2011-03-23 DIAGNOSIS — E1149 Type 2 diabetes mellitus with other diabetic neurological complication: Secondary | ICD-10-CM | POA: Diagnosis not present

## 2011-03-23 DIAGNOSIS — F39 Unspecified mood [affective] disorder: Secondary | ICD-10-CM | POA: Diagnosis not present

## 2011-03-23 DIAGNOSIS — E1142 Type 2 diabetes mellitus with diabetic polyneuropathy: Secondary | ICD-10-CM | POA: Diagnosis not present

## 2011-03-25 ENCOUNTER — Telehealth: Payer: Self-pay | Admitting: Oncology

## 2011-03-25 NOTE — Telephone Encounter (Signed)
called pt and provided appts for feb2013 °

## 2011-04-03 ENCOUNTER — Other Ambulatory Visit: Payer: Medicare Other | Admitting: Lab

## 2011-04-06 DIAGNOSIS — Z8052 Family history of malignant neoplasm of bladder: Secondary | ICD-10-CM | POA: Diagnosis not present

## 2011-04-06 DIAGNOSIS — Z806 Family history of leukemia: Secondary | ICD-10-CM | POA: Diagnosis not present

## 2011-04-06 DIAGNOSIS — Z800 Family history of malignant neoplasm of digestive organs: Secondary | ICD-10-CM | POA: Diagnosis not present

## 2011-04-06 DIAGNOSIS — M79609 Pain in unspecified limb: Secondary | ICD-10-CM | POA: Diagnosis not present

## 2011-04-06 DIAGNOSIS — F39 Unspecified mood [affective] disorder: Secondary | ICD-10-CM | POA: Diagnosis not present

## 2011-04-06 DIAGNOSIS — E1142 Type 2 diabetes mellitus with diabetic polyneuropathy: Secondary | ICD-10-CM | POA: Diagnosis not present

## 2011-04-06 DIAGNOSIS — I824Z9 Acute embolism and thrombosis of unspecified deep veins of unspecified distal lower extremity: Secondary | ICD-10-CM | POA: Diagnosis not present

## 2011-04-06 DIAGNOSIS — M7989 Other specified soft tissue disorders: Secondary | ICD-10-CM | POA: Diagnosis not present

## 2011-04-06 DIAGNOSIS — E785 Hyperlipidemia, unspecified: Secondary | ICD-10-CM | POA: Diagnosis not present

## 2011-04-06 DIAGNOSIS — C569 Malignant neoplasm of unspecified ovary: Secondary | ICD-10-CM | POA: Diagnosis not present

## 2011-04-06 DIAGNOSIS — E1149 Type 2 diabetes mellitus with other diabetic neurological complication: Secondary | ICD-10-CM | POA: Diagnosis not present

## 2011-04-07 DIAGNOSIS — Z8052 Family history of malignant neoplasm of bladder: Secondary | ICD-10-CM | POA: Diagnosis not present

## 2011-04-07 DIAGNOSIS — E1142 Type 2 diabetes mellitus with diabetic polyneuropathy: Secondary | ICD-10-CM | POA: Diagnosis not present

## 2011-04-07 DIAGNOSIS — F39 Unspecified mood [affective] disorder: Secondary | ICD-10-CM | POA: Diagnosis not present

## 2011-04-07 DIAGNOSIS — E785 Hyperlipidemia, unspecified: Secondary | ICD-10-CM | POA: Diagnosis not present

## 2011-04-07 DIAGNOSIS — C569 Malignant neoplasm of unspecified ovary: Secondary | ICD-10-CM | POA: Diagnosis not present

## 2011-04-07 DIAGNOSIS — E1149 Type 2 diabetes mellitus with other diabetic neurological complication: Secondary | ICD-10-CM | POA: Diagnosis not present

## 2011-04-10 DIAGNOSIS — E1142 Type 2 diabetes mellitus with diabetic polyneuropathy: Secondary | ICD-10-CM | POA: Diagnosis not present

## 2011-04-10 DIAGNOSIS — Z8052 Family history of malignant neoplasm of bladder: Secondary | ICD-10-CM | POA: Diagnosis not present

## 2011-04-10 DIAGNOSIS — C569 Malignant neoplasm of unspecified ovary: Secondary | ICD-10-CM | POA: Diagnosis not present

## 2011-04-10 DIAGNOSIS — F39 Unspecified mood [affective] disorder: Secondary | ICD-10-CM | POA: Diagnosis not present

## 2011-04-10 DIAGNOSIS — E785 Hyperlipidemia, unspecified: Secondary | ICD-10-CM | POA: Diagnosis not present

## 2011-04-10 DIAGNOSIS — E1149 Type 2 diabetes mellitus with other diabetic neurological complication: Secondary | ICD-10-CM | POA: Diagnosis not present

## 2011-04-11 ENCOUNTER — Other Ambulatory Visit (HOSPITAL_BASED_OUTPATIENT_CLINIC_OR_DEPARTMENT_OTHER): Payer: Medicare Other | Admitting: Lab

## 2011-04-11 DIAGNOSIS — E119 Type 2 diabetes mellitus without complications: Secondary | ICD-10-CM

## 2011-04-11 DIAGNOSIS — Z170 Estrogen receptor positive status [ER+]: Secondary | ICD-10-CM

## 2011-04-11 DIAGNOSIS — C50919 Malignant neoplasm of unspecified site of unspecified female breast: Secondary | ICD-10-CM | POA: Diagnosis not present

## 2011-04-11 DIAGNOSIS — E559 Vitamin D deficiency, unspecified: Secondary | ICD-10-CM | POA: Diagnosis not present

## 2011-04-11 LAB — CBC WITH DIFFERENTIAL/PLATELET
BASO%: 0.6 % (ref 0.0–2.0)
EOS%: 2.9 % (ref 0.0–7.0)
HCT: 33.6 % — ABNORMAL LOW (ref 34.8–46.6)
LYMPH%: 57.1 % — ABNORMAL HIGH (ref 14.0–49.7)
MCH: 31.8 pg (ref 25.1–34.0)
MCHC: 33.9 g/dL (ref 31.5–36.0)
MCV: 93.7 fL (ref 79.5–101.0)
MONO#: 0.3 10*3/uL (ref 0.1–0.9)
MONO%: 6.1 % (ref 0.0–14.0)
NEUT%: 33.3 % — ABNORMAL LOW (ref 38.4–76.8)
Platelets: 201 10*3/uL (ref 145–400)
RBC: 3.59 10*6/uL — ABNORMAL LOW (ref 3.70–5.45)
WBC: 4.3 10*3/uL (ref 3.9–10.3)

## 2011-04-12 LAB — VITAMIN D 25 HYDROXY (VIT D DEFICIENCY, FRACTURES): Vit D, 25-Hydroxy: 34 ng/mL (ref 30–89)

## 2011-04-12 LAB — COMPREHENSIVE METABOLIC PANEL
ALT: 32 U/L (ref 0–35)
Alkaline Phosphatase: 59 U/L (ref 39–117)
CO2: 31 mEq/L (ref 19–32)
Creatinine, Ser: 0.64 mg/dL (ref 0.50–1.10)
Sodium: 141 mEq/L (ref 135–145)
Total Bilirubin: 0.7 mg/dL (ref 0.3–1.2)
Total Protein: 6.5 g/dL (ref 6.0–8.3)

## 2011-04-13 ENCOUNTER — Ambulatory Visit: Payer: Medicare Other | Admitting: Oncology

## 2011-04-13 DIAGNOSIS — Z8052 Family history of malignant neoplasm of bladder: Secondary | ICD-10-CM | POA: Diagnosis not present

## 2011-04-13 DIAGNOSIS — E1149 Type 2 diabetes mellitus with other diabetic neurological complication: Secondary | ICD-10-CM | POA: Diagnosis not present

## 2011-04-13 DIAGNOSIS — E785 Hyperlipidemia, unspecified: Secondary | ICD-10-CM | POA: Diagnosis not present

## 2011-04-13 DIAGNOSIS — E1142 Type 2 diabetes mellitus with diabetic polyneuropathy: Secondary | ICD-10-CM | POA: Diagnosis not present

## 2011-04-13 DIAGNOSIS — F39 Unspecified mood [affective] disorder: Secondary | ICD-10-CM | POA: Diagnosis not present

## 2011-04-13 DIAGNOSIS — C569 Malignant neoplasm of unspecified ovary: Secondary | ICD-10-CM | POA: Diagnosis not present

## 2011-04-17 DIAGNOSIS — Z87891 Personal history of nicotine dependence: Secondary | ICD-10-CM | POA: Diagnosis not present

## 2011-04-17 DIAGNOSIS — Z79899 Other long term (current) drug therapy: Secondary | ICD-10-CM | POA: Diagnosis not present

## 2011-04-17 DIAGNOSIS — Z8052 Family history of malignant neoplasm of bladder: Secondary | ICD-10-CM | POA: Diagnosis not present

## 2011-04-17 DIAGNOSIS — Z806 Family history of leukemia: Secondary | ICD-10-CM | POA: Diagnosis not present

## 2011-04-17 DIAGNOSIS — I82409 Acute embolism and thrombosis of unspecified deep veins of unspecified lower extremity: Secondary | ICD-10-CM | POA: Diagnosis not present

## 2011-04-17 DIAGNOSIS — Z7901 Long term (current) use of anticoagulants: Secondary | ICD-10-CM | POA: Diagnosis not present

## 2011-04-17 DIAGNOSIS — C569 Malignant neoplasm of unspecified ovary: Secondary | ICD-10-CM | POA: Diagnosis not present

## 2011-04-17 DIAGNOSIS — G609 Hereditary and idiopathic neuropathy, unspecified: Secondary | ICD-10-CM | POA: Diagnosis not present

## 2011-04-18 ENCOUNTER — Ambulatory Visit (HOSPITAL_BASED_OUTPATIENT_CLINIC_OR_DEPARTMENT_OTHER): Payer: Medicare Other | Admitting: Oncology

## 2011-04-18 ENCOUNTER — Telehealth: Payer: Self-pay | Admitting: Oncology

## 2011-04-18 DIAGNOSIS — C50919 Malignant neoplasm of unspecified site of unspecified female breast: Secondary | ICD-10-CM

## 2011-04-18 DIAGNOSIS — E559 Vitamin D deficiency, unspecified: Secondary | ICD-10-CM | POA: Diagnosis not present

## 2011-04-18 DIAGNOSIS — C569 Malignant neoplasm of unspecified ovary: Secondary | ICD-10-CM

## 2011-04-18 NOTE — Patient Instructions (Signed)
   Please send me a copy of the pathology report and operative notes to [address] [place], [place] [address]  C/o me  Or fax to [phone]

## 2011-04-18 NOTE — Progress Notes (Signed)
Hematology and Oncology Follow Up Visit  [name] [facility] [id number] September 05, 1935 76 y.o. 04/18/2011 10:59 PM PCP  Principle Diagnosis: T1 N0 breast cancer, status post radiation therapy completed on 06/22/2004 on Arimidex previously on Femara. 2.  History of bladder calculi. 3.  History of obesity. 4.  History of type 2 diabetes. 5.  History of hyperlipidemia. 6.  History of depression.   Interim History:  There have been no intercurrent illness, hospitalizations or medication changes.[name] was diagnosed with stage IIb ovarian cancer. She was noted to have an ovarian mass on pelvic exam and was referred to [facility]. During laporotomy  , frozen section did not reveal malignancy.  Final pathology  Did however show malignant cells with clear cell features. She is now receiving adjuvant chemotherapy with carboplatin/taxol, she is tolerating it well. She has developed an intercurrent DVT and is on coumadin. Medications: I have reviewed the patient's current medications.  Allergies: No Known Allergies  Past Medical History, Surgical history, Social history, and Family History were reviewed and updated.  Review of Systems: Constitutional:  Negative for fever, chills, night sweats, anorexia, weight loss, pain. Cardiovascular: no chest pain or dyspnea on exertion Respiratory: no cough, shortness of breath, or wheezing Neurological: negative Dermatological: negative ENT: negative Skin Gastrointestinal: no abdominal pain, change in bowel habits, or black or bloody stools Genito-Urinary: negative Hematological and Lymphatic: negative Breast: negative Musculoskeletal: negative Remaining ROS negative.  Physical Exam: Blood pressure 116/65, pulse 99, temperature 98.3 F (36.8 C), temperature source Oral, weight 283 lb 9.6 oz (128.64 kg). ECOG: 0, c/o fatigue General appearance: alert, cooperative and appears stated age Head: Normocephalic, without obvious abnormality, atraumatic Neck: no  adenopathy, no carotid bruit, no JVD, supple, symmetrical, trachea midline and thyroid not enlarged, symmetric, no tenderness/mass/nodules Lymph nodes: Cervical, supraclavicular, and axillary nodes normal. Cardiac : regular rate and rhythm, no murmurs or gallops Pulmonary:clear to auscultation bilaterally and normal percussion bilaterally. Port is in place. Breasts: inspection negative, no nipple discharge or bleeding, no masses or nodularity palpable Abdomen:soft, non-tender; bowel sounds normal; no masses,  no organomegaly Extremities negative Neuro: alert, oriented, normal speech, no focal findings or movement disorder noted  Lab Results: Lab Results  Component Value Date   WBC 4.3 04/11/2011   HGB 11.4* 04/11/2011   HCT 33.6* 04/11/2011   MCV 93.7 04/11/2011   PLT 201 04/11/2011     Chemistry      Component Value Date/Time   NA 141 04/11/2011 1308   K 3.9 04/11/2011 1308   CL 101 04/11/2011 1308   CO2 31 04/11/2011 1308   BUN 12 04/11/2011 1308   CREATININE 0.64 04/11/2011 1308      Component Value Date/Time   CALCIUM 9.6 04/11/2011 1308   CALCIUM 8.4 12/29/2007 0528   ALKPHOS 59 04/11/2011 1308   AST 30 04/11/2011 1308   ALT 32 04/11/2011 1308   BILITOT 0.7 04/11/2011 1308      .pathology. Radiological Studies: chest X-ray n/a Mammogram n/a Bone density n/a  Impression and Plan: [name] is doing well given her recent diagnosis of ovarian cancer.. I did explain the need for genetic testing despite the lack of any other family members with breast and ovarian cancer. I will make arrangements for this and see her in 6 months.  More than 50% of the visit was spent in patient-related counselling   Pierce Crane, MD 2/12/201310:59 PM

## 2011-04-18 NOTE — Telephone Encounter (Signed)
gve the pt her feb 2014 appt calendar. Pt is aware she will be contacted by lisa for the genetics appt.

## 2011-04-20 DIAGNOSIS — Z87891 Personal history of nicotine dependence: Secondary | ICD-10-CM | POA: Diagnosis not present

## 2011-04-20 DIAGNOSIS — Z7901 Long term (current) use of anticoagulants: Secondary | ICD-10-CM | POA: Diagnosis not present

## 2011-04-20 DIAGNOSIS — Z79899 Other long term (current) drug therapy: Secondary | ICD-10-CM | POA: Diagnosis not present

## 2011-04-20 DIAGNOSIS — C569 Malignant neoplasm of unspecified ovary: Secondary | ICD-10-CM | POA: Diagnosis not present

## 2011-04-20 DIAGNOSIS — G609 Hereditary and idiopathic neuropathy, unspecified: Secondary | ICD-10-CM | POA: Diagnosis not present

## 2011-04-20 DIAGNOSIS — I82409 Acute embolism and thrombosis of unspecified deep veins of unspecified lower extremity: Secondary | ICD-10-CM | POA: Diagnosis not present

## 2011-04-24 DIAGNOSIS — Z7901 Long term (current) use of anticoagulants: Secondary | ICD-10-CM | POA: Diagnosis not present

## 2011-04-24 DIAGNOSIS — C569 Malignant neoplasm of unspecified ovary: Secondary | ICD-10-CM | POA: Diagnosis not present

## 2011-04-24 DIAGNOSIS — G609 Hereditary and idiopathic neuropathy, unspecified: Secondary | ICD-10-CM | POA: Diagnosis not present

## 2011-04-24 DIAGNOSIS — Z79899 Other long term (current) drug therapy: Secondary | ICD-10-CM | POA: Diagnosis not present

## 2011-04-24 DIAGNOSIS — Z87891 Personal history of nicotine dependence: Secondary | ICD-10-CM | POA: Diagnosis not present

## 2011-04-24 DIAGNOSIS — I82409 Acute embolism and thrombosis of unspecified deep veins of unspecified lower extremity: Secondary | ICD-10-CM | POA: Diagnosis not present

## 2011-04-26 ENCOUNTER — Telehealth: Payer: Self-pay | Admitting: *Deleted

## 2011-04-26 NOTE — Telephone Encounter (Signed)
Confirmed 05/09/11 genetics appt w/ pt.

## 2011-04-27 DIAGNOSIS — Z79899 Other long term (current) drug therapy: Secondary | ICD-10-CM | POA: Diagnosis not present

## 2011-04-27 DIAGNOSIS — Z87891 Personal history of nicotine dependence: Secondary | ICD-10-CM | POA: Diagnosis not present

## 2011-04-27 DIAGNOSIS — C569 Malignant neoplasm of unspecified ovary: Secondary | ICD-10-CM | POA: Diagnosis not present

## 2011-04-27 DIAGNOSIS — G609 Hereditary and idiopathic neuropathy, unspecified: Secondary | ICD-10-CM | POA: Diagnosis not present

## 2011-04-27 DIAGNOSIS — Z7901 Long term (current) use of anticoagulants: Secondary | ICD-10-CM | POA: Diagnosis not present

## 2011-04-27 DIAGNOSIS — I82409 Acute embolism and thrombosis of unspecified deep veins of unspecified lower extremity: Secondary | ICD-10-CM | POA: Diagnosis not present

## 2011-05-01 DIAGNOSIS — C569 Malignant neoplasm of unspecified ovary: Secondary | ICD-10-CM | POA: Diagnosis not present

## 2011-05-01 DIAGNOSIS — I82409 Acute embolism and thrombosis of unspecified deep veins of unspecified lower extremity: Secondary | ICD-10-CM | POA: Diagnosis not present

## 2011-05-01 DIAGNOSIS — Z79899 Other long term (current) drug therapy: Secondary | ICD-10-CM | POA: Diagnosis not present

## 2011-05-01 DIAGNOSIS — G609 Hereditary and idiopathic neuropathy, unspecified: Secondary | ICD-10-CM | POA: Diagnosis not present

## 2011-05-01 DIAGNOSIS — Z7901 Long term (current) use of anticoagulants: Secondary | ICD-10-CM | POA: Diagnosis not present

## 2011-05-01 DIAGNOSIS — Z87891 Personal history of nicotine dependence: Secondary | ICD-10-CM | POA: Diagnosis not present

## 2011-05-04 DIAGNOSIS — Z8052 Family history of malignant neoplasm of bladder: Secondary | ICD-10-CM | POA: Diagnosis not present

## 2011-05-04 DIAGNOSIS — Z7901 Long term (current) use of anticoagulants: Secondary | ICD-10-CM | POA: Diagnosis not present

## 2011-05-04 DIAGNOSIS — Z79899 Other long term (current) drug therapy: Secondary | ICD-10-CM | POA: Diagnosis not present

## 2011-05-04 DIAGNOSIS — G609 Hereditary and idiopathic neuropathy, unspecified: Secondary | ICD-10-CM | POA: Diagnosis not present

## 2011-05-04 DIAGNOSIS — I82409 Acute embolism and thrombosis of unspecified deep veins of unspecified lower extremity: Secondary | ICD-10-CM | POA: Diagnosis not present

## 2011-05-04 DIAGNOSIS — Z806 Family history of leukemia: Secondary | ICD-10-CM | POA: Diagnosis not present

## 2011-05-04 DIAGNOSIS — C569 Malignant neoplasm of unspecified ovary: Secondary | ICD-10-CM | POA: Diagnosis not present

## 2011-05-04 DIAGNOSIS — Z87891 Personal history of nicotine dependence: Secondary | ICD-10-CM | POA: Diagnosis not present

## 2011-05-08 ENCOUNTER — Ambulatory Visit: Payer: Medicare Other | Admitting: Lab

## 2011-05-08 ENCOUNTER — Ambulatory Visit (HOSPITAL_BASED_OUTPATIENT_CLINIC_OR_DEPARTMENT_OTHER): Payer: Medicare Other | Admitting: Genetic Counselor

## 2011-05-08 DIAGNOSIS — Z853 Personal history of malignant neoplasm of breast: Secondary | ICD-10-CM | POA: Diagnosis not present

## 2011-05-08 DIAGNOSIS — Z800 Family history of malignant neoplasm of digestive organs: Secondary | ICD-10-CM | POA: Diagnosis not present

## 2011-05-08 DIAGNOSIS — C50919 Malignant neoplasm of unspecified site of unspecified female breast: Secondary | ICD-10-CM | POA: Diagnosis not present

## 2011-05-08 DIAGNOSIS — C569 Malignant neoplasm of unspecified ovary: Secondary | ICD-10-CM | POA: Diagnosis not present

## 2011-05-08 NOTE — Progress Notes (Signed)
Dr. Donnie Coffin requested a consultation for genetic counseling and risk assessment for [name], a 76 y.o. female, for discussion of her personal history of breast and ovarian cancer. She presents to clinic today to discuss the possibility of a genetic predisposition to cancer, and to further clarify her risks, as well as her family members' risks for cancer.   HISTORY OF PRESENT ILLNESS: In 2005, at the age of 89, [name] was diagnosed with breast cancer.  In 2013, at the age of 25, [name] was diagnosed with ovarian cancer. This was treated with surgery and chemotherapy.    Past Medical History  Diagnosis Date  . Hypertension   . Diabetes mellitus   . Arthritis   . Cancer     CANCER    Past Surgical History  Procedure Date  . Breast surgery 03-2004    lumpectomy     History  Substance Use Topics  . Smoking status: Former Games developer  . Smokeless tobacco: Never Used  . Alcohol Use: No    REPRODUCTIVE HISTORY AND PERSONAL RISK ASSESSMENT FACTORS: Postmenopausal Uterus Intact: no Ovaries Intact: no G0P0A0 , first live birth at age never had children   FAMILY HISTORY:  We obtained a detailed, 4-generation family history.  Significant diagnoses are listed below: Family History  Problem Relation Age of Onset  . Cancer Father     BLADDER  . Cancer Brother   . Cancer Sister   Ms. Finkler has 5 siblings.  A sister was diagnosed with colon cancer at age 45 and died at age 51.  Her brother was diagnosed with leukemia at age 82 and died at age 67. Her mother died of old age at 69.  Her mother had 4 siblings.  One sister had colon cancer in her 42s and that sister's daughter had colon cancer in her 58s.  Her maternal grandparents both died of unknown cancers.  Ms. Pinder father was diagnosed with bladder cancer at age 66 and died at age 40.  He had 5 sisters and one brother - none of whom had cancer.  Her paternal grandmother died of old age at age 64 and her grandfather  died of kidney failure.  Patient's maternal ancestors are of [place] descent, and paternal ancestors are of [place] descent. There is no reported Ashkenazi Jewish ancestry. There is no  known consanguinity.  GENETIC COUNSELING RISK ASSESSMENT, DISCUSSION, AND SUGGESTED FOLLOW UP: We reviewed the natural history and genetic etiology of sporadic, familial and hereditary cancer syndromes.  We discussed that her personal history is suggestive of hereditary breast and ovarian cancer as a result of BRCA1 or BRCA2 mutations.  However, her family history is suggestive of a possible colon cancer syndrome called Lynch syndrome.  We discussed genetic testing for BRCA1 and BRCA2 mutations as well as a cancer panel from [facility] that would look at up to 22 genes that could look at colon cancer syndromes, such as Lynch Syndrome, as well as other breast cancer genes.  The patient's personal history of breast and ovarian cancer is suggestive of the following possible diagnosis: BRCA1 and BRCA2 mutations The patients personal and family history of breast, ovarian, colon and bladder cancer is suggestive of Lynch syndrome.  We discussed that identification of a hereditary cancer syndrome may help her care providers tailor the patients medical management. If a mutation indicating hereditary breast and ovarian cancer is detected in this case, the patient will be referred back to the referring provider  and to any additional appropriate care providers to discuss the relevant options. If a mutation in a colon cancer gene is detected the patient may be referred to GI to talk with the needed surveillance for colon cancer.  If a mutation is not found in the patient, this will decrease the likelihood of BRCA1, BRCA2 or other gene mutations as the explanation for her breast and/or ovarian cancer. Cancer surveillance options would be discussed for the patient according to the appropriate standard National Comprehensive Cancer  Network and American Cancer Society guidelines, with consideration of their personal and family history risk factors. In this case, the patient will be referred back to their care providers for discussions of management.    After considering the risks, benefits, and limitations, the patient provided informed consent for  the following  testing:  BRCAnalysis through [facility] and, if negative, Cancer Next through [employer]   Per the patient's request, we will contact her by telephone to discuss these results. A follow up genetic counseling visit will be scheduled if indicated.  The patient was seen for a total of 90 minutes, greater than 50% of which was spent face-to-face counseling.  This plan is being carried out per Dr. Renelda Loma recommendations.  This note will also be sent to the referring provider via the electronic medical record. The patient will be supplied with a summary of this genetic counseling discussion as well as educational information on the discussed hereditary cancer syndromes following the conclusion of their visit.   Patient was discussed with Dr. Drue Second.  EPIC CC: Dr. Donnie Coffin  EDUCATIONAL INFORMATION SUPPLIED TO PATIENT AT ENCOUNTER:  Hereditary Breast and Ovarian Cancer brochure from [facility]   _______________________________________________________________________ For Office Staff:  Number of people involved in session: 2 Was an Intern/ student involved with case: not applicable }

## 2011-05-11 DIAGNOSIS — C569 Malignant neoplasm of unspecified ovary: Secondary | ICD-10-CM | POA: Diagnosis not present

## 2011-05-11 DIAGNOSIS — Z87891 Personal history of nicotine dependence: Secondary | ICD-10-CM | POA: Diagnosis not present

## 2011-05-11 DIAGNOSIS — I82409 Acute embolism and thrombosis of unspecified deep veins of unspecified lower extremity: Secondary | ICD-10-CM | POA: Diagnosis not present

## 2011-05-11 DIAGNOSIS — Z79899 Other long term (current) drug therapy: Secondary | ICD-10-CM | POA: Diagnosis not present

## 2011-05-11 DIAGNOSIS — Z7901 Long term (current) use of anticoagulants: Secondary | ICD-10-CM | POA: Diagnosis not present

## 2011-05-11 DIAGNOSIS — G609 Hereditary and idiopathic neuropathy, unspecified: Secondary | ICD-10-CM | POA: Diagnosis not present

## 2011-05-12 DIAGNOSIS — C569 Malignant neoplasm of unspecified ovary: Secondary | ICD-10-CM | POA: Diagnosis not present

## 2011-05-12 DIAGNOSIS — E119 Type 2 diabetes mellitus without complications: Secondary | ICD-10-CM | POA: Diagnosis not present

## 2011-05-15 ENCOUNTER — Encounter: Payer: Self-pay | Admitting: Genetic Counselor

## 2011-05-15 ENCOUNTER — Telehealth: Payer: Self-pay | Admitting: Genetic Counselor

## 2011-05-15 NOTE — Telephone Encounter (Signed)
Called patient with negative BRCA and BART test results.  We will proceed with the [facility] next panel.

## 2011-05-16 DIAGNOSIS — C569 Malignant neoplasm of unspecified ovary: Secondary | ICD-10-CM | POA: Diagnosis not present

## 2011-05-16 DIAGNOSIS — I82409 Acute embolism and thrombosis of unspecified deep veins of unspecified lower extremity: Secondary | ICD-10-CM | POA: Diagnosis not present

## 2011-05-18 DIAGNOSIS — E785 Hyperlipidemia, unspecified: Secondary | ICD-10-CM | POA: Diagnosis not present

## 2011-05-18 DIAGNOSIS — D126 Benign neoplasm of colon, unspecified: Secondary | ICD-10-CM | POA: Diagnosis not present

## 2011-05-18 DIAGNOSIS — Z8052 Family history of malignant neoplasm of bladder: Secondary | ICD-10-CM | POA: Diagnosis not present

## 2011-05-18 DIAGNOSIS — I82409 Acute embolism and thrombosis of unspecified deep veins of unspecified lower extremity: Secondary | ICD-10-CM | POA: Diagnosis not present

## 2011-05-18 DIAGNOSIS — E119 Type 2 diabetes mellitus without complications: Secondary | ICD-10-CM | POA: Diagnosis not present

## 2011-05-18 DIAGNOSIS — G609 Hereditary and idiopathic neuropathy, unspecified: Secondary | ICD-10-CM | POA: Diagnosis not present

## 2011-05-18 DIAGNOSIS — N200 Calculus of kidney: Secondary | ICD-10-CM | POA: Diagnosis not present

## 2011-05-18 DIAGNOSIS — Z800 Family history of malignant neoplasm of digestive organs: Secondary | ICD-10-CM | POA: Diagnosis not present

## 2011-05-18 DIAGNOSIS — C569 Malignant neoplasm of unspecified ovary: Secondary | ICD-10-CM | POA: Diagnosis not present

## 2011-05-18 DIAGNOSIS — Z9889 Other specified postprocedural states: Secondary | ICD-10-CM | POA: Diagnosis not present

## 2011-05-18 DIAGNOSIS — Z806 Family history of leukemia: Secondary | ICD-10-CM | POA: Diagnosis not present

## 2011-05-24 ENCOUNTER — Encounter: Payer: Self-pay | Admitting: Oncology

## 2011-06-01 DIAGNOSIS — E119 Type 2 diabetes mellitus without complications: Secondary | ICD-10-CM | POA: Diagnosis not present

## 2011-06-01 DIAGNOSIS — C569 Malignant neoplasm of unspecified ovary: Secondary | ICD-10-CM | POA: Diagnosis not present

## 2011-06-01 DIAGNOSIS — E785 Hyperlipidemia, unspecified: Secondary | ICD-10-CM | POA: Diagnosis not present

## 2011-06-01 DIAGNOSIS — N200 Calculus of kidney: Secondary | ICD-10-CM | POA: Diagnosis not present

## 2011-06-01 DIAGNOSIS — I82409 Acute embolism and thrombosis of unspecified deep veins of unspecified lower extremity: Secondary | ICD-10-CM | POA: Diagnosis not present

## 2011-06-01 DIAGNOSIS — G609 Hereditary and idiopathic neuropathy, unspecified: Secondary | ICD-10-CM | POA: Diagnosis not present

## 2011-06-01 DIAGNOSIS — Z9889 Other specified postprocedural states: Secondary | ICD-10-CM | POA: Diagnosis not present

## 2011-06-01 DIAGNOSIS — D126 Benign neoplasm of colon, unspecified: Secondary | ICD-10-CM | POA: Diagnosis not present

## 2011-06-03 DIAGNOSIS — I82409 Acute embolism and thrombosis of unspecified deep veins of unspecified lower extremity: Secondary | ICD-10-CM | POA: Diagnosis not present

## 2011-06-03 DIAGNOSIS — Z79899 Other long term (current) drug therapy: Secondary | ICD-10-CM | POA: Diagnosis not present

## 2011-06-05 DIAGNOSIS — N200 Calculus of kidney: Secondary | ICD-10-CM | POA: Diagnosis not present

## 2011-06-05 DIAGNOSIS — D126 Benign neoplasm of colon, unspecified: Secondary | ICD-10-CM | POA: Diagnosis not present

## 2011-06-05 DIAGNOSIS — E785 Hyperlipidemia, unspecified: Secondary | ICD-10-CM | POA: Diagnosis not present

## 2011-06-05 DIAGNOSIS — C569 Malignant neoplasm of unspecified ovary: Secondary | ICD-10-CM | POA: Diagnosis not present

## 2011-06-05 DIAGNOSIS — E119 Type 2 diabetes mellitus without complications: Secondary | ICD-10-CM | POA: Diagnosis not present

## 2011-06-05 DIAGNOSIS — G609 Hereditary and idiopathic neuropathy, unspecified: Secondary | ICD-10-CM | POA: Diagnosis not present

## 2011-06-08 DIAGNOSIS — D126 Benign neoplasm of colon, unspecified: Secondary | ICD-10-CM | POA: Diagnosis not present

## 2011-06-08 DIAGNOSIS — G609 Hereditary and idiopathic neuropathy, unspecified: Secondary | ICD-10-CM | POA: Diagnosis not present

## 2011-06-08 DIAGNOSIS — C569 Malignant neoplasm of unspecified ovary: Secondary | ICD-10-CM | POA: Diagnosis not present

## 2011-06-08 DIAGNOSIS — N200 Calculus of kidney: Secondary | ICD-10-CM | POA: Diagnosis not present

## 2011-06-08 DIAGNOSIS — E119 Type 2 diabetes mellitus without complications: Secondary | ICD-10-CM | POA: Diagnosis not present

## 2011-06-08 DIAGNOSIS — E785 Hyperlipidemia, unspecified: Secondary | ICD-10-CM | POA: Diagnosis not present

## 2011-06-12 DIAGNOSIS — G609 Hereditary and idiopathic neuropathy, unspecified: Secondary | ICD-10-CM | POA: Diagnosis not present

## 2011-06-12 DIAGNOSIS — E119 Type 2 diabetes mellitus without complications: Secondary | ICD-10-CM | POA: Diagnosis not present

## 2011-06-12 DIAGNOSIS — E785 Hyperlipidemia, unspecified: Secondary | ICD-10-CM | POA: Diagnosis not present

## 2011-06-12 DIAGNOSIS — C569 Malignant neoplasm of unspecified ovary: Secondary | ICD-10-CM | POA: Diagnosis not present

## 2011-06-12 DIAGNOSIS — D126 Benign neoplasm of colon, unspecified: Secondary | ICD-10-CM | POA: Diagnosis not present

## 2011-06-12 DIAGNOSIS — N200 Calculus of kidney: Secondary | ICD-10-CM | POA: Diagnosis not present

## 2011-06-13 DIAGNOSIS — C569 Malignant neoplasm of unspecified ovary: Secondary | ICD-10-CM | POA: Diagnosis not present

## 2011-06-13 DIAGNOSIS — N200 Calculus of kidney: Secondary | ICD-10-CM | POA: Diagnosis not present

## 2011-06-13 DIAGNOSIS — E119 Type 2 diabetes mellitus without complications: Secondary | ICD-10-CM | POA: Diagnosis not present

## 2011-06-13 DIAGNOSIS — D126 Benign neoplasm of colon, unspecified: Secondary | ICD-10-CM | POA: Diagnosis not present

## 2011-06-13 DIAGNOSIS — G609 Hereditary and idiopathic neuropathy, unspecified: Secondary | ICD-10-CM | POA: Diagnosis not present

## 2011-06-13 DIAGNOSIS — E785 Hyperlipidemia, unspecified: Secondary | ICD-10-CM | POA: Diagnosis not present

## 2011-06-15 DIAGNOSIS — C569 Malignant neoplasm of unspecified ovary: Secondary | ICD-10-CM | POA: Diagnosis not present

## 2011-06-15 DIAGNOSIS — D126 Benign neoplasm of colon, unspecified: Secondary | ICD-10-CM | POA: Diagnosis not present

## 2011-06-15 DIAGNOSIS — N200 Calculus of kidney: Secondary | ICD-10-CM | POA: Diagnosis not present

## 2011-06-15 DIAGNOSIS — E785 Hyperlipidemia, unspecified: Secondary | ICD-10-CM | POA: Diagnosis not present

## 2011-06-15 DIAGNOSIS — G609 Hereditary and idiopathic neuropathy, unspecified: Secondary | ICD-10-CM | POA: Diagnosis not present

## 2011-06-15 DIAGNOSIS — E119 Type 2 diabetes mellitus without complications: Secondary | ICD-10-CM | POA: Diagnosis not present

## 2011-06-19 DIAGNOSIS — Z7901 Long term (current) use of anticoagulants: Secondary | ICD-10-CM | POA: Diagnosis not present

## 2011-06-19 DIAGNOSIS — Z79899 Other long term (current) drug therapy: Secondary | ICD-10-CM | POA: Diagnosis not present

## 2011-06-22 DIAGNOSIS — R1900 Intra-abdominal and pelvic swelling, mass and lump, unspecified site: Secondary | ICD-10-CM | POA: Diagnosis not present

## 2011-06-22 DIAGNOSIS — Z8543 Personal history of malignant neoplasm of ovary: Secondary | ICD-10-CM | POA: Diagnosis not present

## 2011-06-22 DIAGNOSIS — Z79899 Other long term (current) drug therapy: Secondary | ICD-10-CM | POA: Diagnosis not present

## 2011-06-22 DIAGNOSIS — R142 Eructation: Secondary | ICD-10-CM | POA: Diagnosis not present

## 2011-06-22 DIAGNOSIS — R109 Unspecified abdominal pain: Secondary | ICD-10-CM | POA: Diagnosis not present

## 2011-06-22 DIAGNOSIS — C569 Malignant neoplasm of unspecified ovary: Secondary | ICD-10-CM | POA: Diagnosis not present

## 2011-06-22 DIAGNOSIS — Z90710 Acquired absence of both cervix and uterus: Secondary | ICD-10-CM | POA: Diagnosis not present

## 2011-06-22 DIAGNOSIS — Z808 Family history of malignant neoplasm of other organs or systems: Secondary | ICD-10-CM | POA: Diagnosis not present

## 2011-06-22 DIAGNOSIS — E119 Type 2 diabetes mellitus without complications: Secondary | ICD-10-CM | POA: Diagnosis not present

## 2011-06-22 DIAGNOSIS — Z7901 Long term (current) use of anticoagulants: Secondary | ICD-10-CM | POA: Diagnosis not present

## 2011-06-22 DIAGNOSIS — R141 Gas pain: Secondary | ICD-10-CM | POA: Diagnosis not present

## 2011-06-22 DIAGNOSIS — I82409 Acute embolism and thrombosis of unspecified deep veins of unspecified lower extremity: Secondary | ICD-10-CM | POA: Diagnosis not present

## 2011-06-22 DIAGNOSIS — R0602 Shortness of breath: Secondary | ICD-10-CM | POA: Diagnosis not present

## 2011-06-22 DIAGNOSIS — R1909 Other intra-abdominal and pelvic swelling, mass and lump: Secondary | ICD-10-CM | POA: Diagnosis not present

## 2011-06-22 DIAGNOSIS — Z86010 Personal history of colonic polyps: Secondary | ICD-10-CM | POA: Diagnosis not present

## 2011-06-22 DIAGNOSIS — R143 Flatulence: Secondary | ICD-10-CM | POA: Diagnosis not present

## 2011-06-22 DIAGNOSIS — E785 Hyperlipidemia, unspecified: Secondary | ICD-10-CM | POA: Diagnosis not present

## 2011-06-22 DIAGNOSIS — Z87891 Personal history of nicotine dependence: Secondary | ICD-10-CM | POA: Diagnosis not present

## 2011-06-22 DIAGNOSIS — Z9079 Acquired absence of other genital organ(s): Secondary | ICD-10-CM | POA: Diagnosis not present

## 2011-06-26 DIAGNOSIS — R609 Edema, unspecified: Secondary | ICD-10-CM | POA: Diagnosis not present

## 2011-06-26 DIAGNOSIS — Z7901 Long term (current) use of anticoagulants: Secondary | ICD-10-CM | POA: Diagnosis not present

## 2011-06-27 DIAGNOSIS — Z7901 Long term (current) use of anticoagulants: Secondary | ICD-10-CM | POA: Diagnosis not present

## 2011-06-29 DIAGNOSIS — Z7901 Long term (current) use of anticoagulants: Secondary | ICD-10-CM | POA: Diagnosis not present

## 2011-06-29 DIAGNOSIS — R0602 Shortness of breath: Secondary | ICD-10-CM | POA: Diagnosis not present

## 2011-06-29 DIAGNOSIS — I10 Essential (primary) hypertension: Secondary | ICD-10-CM | POA: Diagnosis not present

## 2011-06-29 DIAGNOSIS — R011 Cardiac murmur, unspecified: Secondary | ICD-10-CM | POA: Diagnosis not present

## 2011-06-29 DIAGNOSIS — I82409 Acute embolism and thrombosis of unspecified deep veins of unspecified lower extremity: Secondary | ICD-10-CM | POA: Diagnosis not present

## 2011-06-30 DIAGNOSIS — R011 Cardiac murmur, unspecified: Secondary | ICD-10-CM | POA: Diagnosis not present

## 2011-06-30 DIAGNOSIS — R0602 Shortness of breath: Secondary | ICD-10-CM | POA: Diagnosis not present

## 2011-06-30 DIAGNOSIS — I10 Essential (primary) hypertension: Secondary | ICD-10-CM | POA: Diagnosis not present

## 2011-07-03 DIAGNOSIS — Z7901 Long term (current) use of anticoagulants: Secondary | ICD-10-CM | POA: Diagnosis not present

## 2011-07-06 DIAGNOSIS — E119 Type 2 diabetes mellitus without complications: Secondary | ICD-10-CM | POA: Diagnosis not present

## 2011-07-06 DIAGNOSIS — I82409 Acute embolism and thrombosis of unspecified deep veins of unspecified lower extremity: Secondary | ICD-10-CM | POA: Diagnosis not present

## 2011-07-06 DIAGNOSIS — R0602 Shortness of breath: Secondary | ICD-10-CM | POA: Diagnosis not present

## 2011-07-06 DIAGNOSIS — R109 Unspecified abdominal pain: Secondary | ICD-10-CM | POA: Diagnosis not present

## 2011-07-06 DIAGNOSIS — C569 Malignant neoplasm of unspecified ovary: Secondary | ICD-10-CM | POA: Diagnosis not present

## 2011-07-06 DIAGNOSIS — R142 Eructation: Secondary | ICD-10-CM | POA: Diagnosis not present

## 2011-07-07 DIAGNOSIS — R109 Unspecified abdominal pain: Secondary | ICD-10-CM | POA: Diagnosis not present

## 2011-07-07 DIAGNOSIS — C569 Malignant neoplasm of unspecified ovary: Secondary | ICD-10-CM | POA: Diagnosis not present

## 2011-07-07 DIAGNOSIS — R0602 Shortness of breath: Secondary | ICD-10-CM | POA: Diagnosis not present

## 2011-07-07 DIAGNOSIS — R141 Gas pain: Secondary | ICD-10-CM | POA: Diagnosis not present

## 2011-07-07 DIAGNOSIS — E119 Type 2 diabetes mellitus without complications: Secondary | ICD-10-CM | POA: Diagnosis not present

## 2011-07-07 DIAGNOSIS — I82409 Acute embolism and thrombosis of unspecified deep veins of unspecified lower extremity: Secondary | ICD-10-CM | POA: Diagnosis not present

## 2011-07-07 DIAGNOSIS — Z7901 Long term (current) use of anticoagulants: Secondary | ICD-10-CM | POA: Diagnosis not present

## 2011-07-07 DIAGNOSIS — R143 Flatulence: Secondary | ICD-10-CM | POA: Diagnosis not present

## 2011-07-11 DIAGNOSIS — Z7901 Long term (current) use of anticoagulants: Secondary | ICD-10-CM | POA: Diagnosis not present

## 2011-07-18 DIAGNOSIS — Z7901 Long term (current) use of anticoagulants: Secondary | ICD-10-CM | POA: Diagnosis not present

## 2011-08-01 DIAGNOSIS — Z7901 Long term (current) use of anticoagulants: Secondary | ICD-10-CM | POA: Diagnosis not present

## 2011-08-03 DIAGNOSIS — C569 Malignant neoplasm of unspecified ovary: Secondary | ICD-10-CM | POA: Diagnosis not present

## 2011-08-03 DIAGNOSIS — Z86718 Personal history of other venous thrombosis and embolism: Secondary | ICD-10-CM | POA: Diagnosis not present

## 2011-08-04 DIAGNOSIS — I10 Essential (primary) hypertension: Secondary | ICD-10-CM | POA: Diagnosis not present

## 2011-08-04 DIAGNOSIS — R0602 Shortness of breath: Secondary | ICD-10-CM | POA: Diagnosis not present

## 2011-08-18 DIAGNOSIS — Z7901 Long term (current) use of anticoagulants: Secondary | ICD-10-CM | POA: Diagnosis not present

## 2011-08-18 DIAGNOSIS — C569 Malignant neoplasm of unspecified ovary: Secondary | ICD-10-CM | POA: Diagnosis not present

## 2011-08-18 DIAGNOSIS — I82409 Acute embolism and thrombosis of unspecified deep veins of unspecified lower extremity: Secondary | ICD-10-CM | POA: Diagnosis not present

## 2011-08-22 DIAGNOSIS — M545 Low back pain: Secondary | ICD-10-CM | POA: Diagnosis not present

## 2011-08-22 DIAGNOSIS — E782 Mixed hyperlipidemia: Secondary | ICD-10-CM | POA: Diagnosis not present

## 2011-08-22 DIAGNOSIS — Z7901 Long term (current) use of anticoagulants: Secondary | ICD-10-CM | POA: Diagnosis not present

## 2011-08-22 DIAGNOSIS — E119 Type 2 diabetes mellitus without complications: Secondary | ICD-10-CM | POA: Diagnosis not present

## 2011-08-22 DIAGNOSIS — R0989 Other specified symptoms and signs involving the circulatory and respiratory systems: Secondary | ICD-10-CM | POA: Diagnosis not present

## 2011-08-22 DIAGNOSIS — I10 Essential (primary) hypertension: Secondary | ICD-10-CM | POA: Diagnosis not present

## 2011-08-29 DIAGNOSIS — M545 Low back pain: Secondary | ICD-10-CM | POA: Diagnosis not present

## 2011-08-29 DIAGNOSIS — M2550 Pain in unspecified joint: Secondary | ICD-10-CM | POA: Diagnosis not present

## 2011-08-30 ENCOUNTER — Encounter: Payer: Self-pay | Admitting: Oncology

## 2011-09-04 ENCOUNTER — Other Ambulatory Visit: Payer: Self-pay | Admitting: Physical Medicine and Rehabilitation

## 2011-09-04 DIAGNOSIS — M545 Low back pain: Secondary | ICD-10-CM

## 2011-09-05 DIAGNOSIS — Z7901 Long term (current) use of anticoagulants: Secondary | ICD-10-CM | POA: Diagnosis not present

## 2011-09-06 DIAGNOSIS — Z7901 Long term (current) use of anticoagulants: Secondary | ICD-10-CM | POA: Diagnosis not present

## 2011-09-08 ENCOUNTER — Telehealth: Payer: Self-pay | Admitting: Genetic Counselor

## 2011-09-08 ENCOUNTER — Encounter: Payer: Self-pay | Admitting: Genetic Counselor

## 2011-09-08 NOTE — Telephone Encounter (Signed)
Revealed Negative CancerNext test results.

## 2011-09-12 DIAGNOSIS — Z7901 Long term (current) use of anticoagulants: Secondary | ICD-10-CM | POA: Diagnosis not present

## 2011-09-13 ENCOUNTER — Ambulatory Visit
Admission: RE | Admit: 2011-09-13 | Discharge: 2011-09-13 | Disposition: A | Discharge status: Home / Self Care | Payer: Medicare Other | Source: Ambulatory Visit | Attending: Physical Medicine and Rehabilitation | Admitting: Physical Medicine and Rehabilitation

## 2011-09-13 DIAGNOSIS — M5126 Other intervertebral disc displacement, lumbar region: Secondary | ICD-10-CM | POA: Diagnosis not present

## 2011-09-13 DIAGNOSIS — M545 Low back pain: Secondary | ICD-10-CM

## 2011-09-13 DIAGNOSIS — R209 Unspecified disturbances of skin sensation: Secondary | ICD-10-CM | POA: Diagnosis not present

## 2011-09-13 MED ORDER — GADOBENATE DIMEGLUMINE 529 MG/ML IV SOLN
20.0000 mL | Freq: Once | INTRAVENOUS | Status: AC | PRN
  Administered 2011-09-13: 20 mL via INTRAVENOUS

## 2011-09-21 DIAGNOSIS — Z452 Encounter for adjustment and management of vascular access device: Secondary | ICD-10-CM | POA: Diagnosis not present

## 2011-09-25 DIAGNOSIS — M48061 Spinal stenosis, lumbar region without neurogenic claudication: Secondary | ICD-10-CM | POA: Diagnosis not present

## 2011-09-25 DIAGNOSIS — IMO0002 Reserved for concepts with insufficient information to code with codable children: Secondary | ICD-10-CM | POA: Diagnosis not present

## 2011-09-26 DIAGNOSIS — Z7901 Long term (current) use of anticoagulants: Secondary | ICD-10-CM | POA: Diagnosis not present

## 2011-09-26 DIAGNOSIS — Z7189 Other specified counseling: Secondary | ICD-10-CM | POA: Diagnosis not present

## 2011-10-03 DIAGNOSIS — M48061 Spinal stenosis, lumbar region without neurogenic claudication: Secondary | ICD-10-CM | POA: Diagnosis not present

## 2011-10-03 DIAGNOSIS — IMO0002 Reserved for concepts with insufficient information to code with codable children: Secondary | ICD-10-CM | POA: Diagnosis not present

## 2011-10-09 DIAGNOSIS — IMO0002 Reserved for concepts with insufficient information to code with codable children: Secondary | ICD-10-CM | POA: Diagnosis not present

## 2011-10-09 DIAGNOSIS — M545 Low back pain: Secondary | ICD-10-CM | POA: Diagnosis not present

## 2011-10-09 DIAGNOSIS — M48061 Spinal stenosis, lumbar region without neurogenic claudication: Secondary | ICD-10-CM | POA: Diagnosis not present

## 2011-10-12 DIAGNOSIS — M545 Low back pain: Secondary | ICD-10-CM | POA: Diagnosis not present

## 2011-10-12 DIAGNOSIS — IMO0002 Reserved for concepts with insufficient information to code with codable children: Secondary | ICD-10-CM | POA: Diagnosis not present

## 2011-10-12 DIAGNOSIS — M48061 Spinal stenosis, lumbar region without neurogenic claudication: Secondary | ICD-10-CM | POA: Diagnosis not present

## 2011-10-16 DIAGNOSIS — IMO0002 Reserved for concepts with insufficient information to code with codable children: Secondary | ICD-10-CM | POA: Diagnosis not present

## 2011-10-16 DIAGNOSIS — M545 Low back pain: Secondary | ICD-10-CM | POA: Diagnosis not present

## 2011-10-16 DIAGNOSIS — M48061 Spinal stenosis, lumbar region without neurogenic claudication: Secondary | ICD-10-CM | POA: Diagnosis not present

## 2011-10-17 DIAGNOSIS — Z7901 Long term (current) use of anticoagulants: Secondary | ICD-10-CM | POA: Diagnosis not present

## 2011-10-19 DIAGNOSIS — M48061 Spinal stenosis, lumbar region without neurogenic claudication: Secondary | ICD-10-CM | POA: Diagnosis not present

## 2011-10-19 DIAGNOSIS — IMO0002 Reserved for concepts with insufficient information to code with codable children: Secondary | ICD-10-CM | POA: Diagnosis not present

## 2011-10-19 DIAGNOSIS — M545 Low back pain: Secondary | ICD-10-CM | POA: Diagnosis not present

## 2011-10-23 DIAGNOSIS — M545 Low back pain: Secondary | ICD-10-CM | POA: Diagnosis not present

## 2011-10-23 DIAGNOSIS — M48061 Spinal stenosis, lumbar region without neurogenic claudication: Secondary | ICD-10-CM | POA: Diagnosis not present

## 2011-10-23 DIAGNOSIS — IMO0002 Reserved for concepts with insufficient information to code with codable children: Secondary | ICD-10-CM | POA: Diagnosis not present

## 2011-10-25 DIAGNOSIS — M48061 Spinal stenosis, lumbar region without neurogenic claudication: Secondary | ICD-10-CM | POA: Diagnosis not present

## 2011-10-25 DIAGNOSIS — IMO0002 Reserved for concepts with insufficient information to code with codable children: Secondary | ICD-10-CM | POA: Diagnosis not present

## 2011-10-25 DIAGNOSIS — M545 Low back pain: Secondary | ICD-10-CM | POA: Diagnosis not present

## 2011-10-26 DIAGNOSIS — Z86718 Personal history of other venous thrombosis and embolism: Secondary | ICD-10-CM | POA: Diagnosis not present

## 2011-10-26 DIAGNOSIS — C569 Malignant neoplasm of unspecified ovary: Secondary | ICD-10-CM | POA: Diagnosis not present

## 2011-10-30 DIAGNOSIS — M48061 Spinal stenosis, lumbar region without neurogenic claudication: Secondary | ICD-10-CM | POA: Diagnosis not present

## 2011-10-30 DIAGNOSIS — IMO0002 Reserved for concepts with insufficient information to code with codable children: Secondary | ICD-10-CM | POA: Diagnosis not present

## 2011-10-30 DIAGNOSIS — M545 Low back pain: Secondary | ICD-10-CM | POA: Diagnosis not present

## 2011-11-02 DIAGNOSIS — M545 Low back pain: Secondary | ICD-10-CM | POA: Diagnosis not present

## 2011-11-02 DIAGNOSIS — IMO0002 Reserved for concepts with insufficient information to code with codable children: Secondary | ICD-10-CM | POA: Diagnosis not present

## 2011-11-02 DIAGNOSIS — M48061 Spinal stenosis, lumbar region without neurogenic claudication: Secondary | ICD-10-CM | POA: Diagnosis not present

## 2011-11-03 DIAGNOSIS — I959 Hypotension, unspecified: Secondary | ICD-10-CM | POA: Diagnosis not present

## 2011-11-03 DIAGNOSIS — I82409 Acute embolism and thrombosis of unspecified deep veins of unspecified lower extremity: Secondary | ICD-10-CM | POA: Diagnosis not present

## 2011-11-03 DIAGNOSIS — I359 Nonrheumatic aortic valve disorder, unspecified: Secondary | ICD-10-CM | POA: Diagnosis not present

## 2011-11-03 DIAGNOSIS — R0989 Other specified symptoms and signs involving the circulatory and respiratory systems: Secondary | ICD-10-CM | POA: Diagnosis not present

## 2011-11-03 DIAGNOSIS — R0602 Shortness of breath: Secondary | ICD-10-CM | POA: Diagnosis not present

## 2011-11-03 DIAGNOSIS — E119 Type 2 diabetes mellitus without complications: Secondary | ICD-10-CM | POA: Diagnosis not present

## 2011-11-07 DIAGNOSIS — M48061 Spinal stenosis, lumbar region without neurogenic claudication: Secondary | ICD-10-CM | POA: Diagnosis not present

## 2011-11-07 DIAGNOSIS — M545 Low back pain: Secondary | ICD-10-CM | POA: Diagnosis not present

## 2011-11-07 DIAGNOSIS — IMO0002 Reserved for concepts with insufficient information to code with codable children: Secondary | ICD-10-CM | POA: Diagnosis not present

## 2011-11-09 DIAGNOSIS — M545 Low back pain: Secondary | ICD-10-CM | POA: Diagnosis not present

## 2011-11-09 DIAGNOSIS — IMO0002 Reserved for concepts with insufficient information to code with codable children: Secondary | ICD-10-CM | POA: Diagnosis not present

## 2011-11-09 DIAGNOSIS — M48061 Spinal stenosis, lumbar region without neurogenic claudication: Secondary | ICD-10-CM | POA: Diagnosis not present

## 2011-11-13 DIAGNOSIS — M545 Low back pain: Secondary | ICD-10-CM | POA: Diagnosis not present

## 2011-11-13 DIAGNOSIS — M48061 Spinal stenosis, lumbar region without neurogenic claudication: Secondary | ICD-10-CM | POA: Diagnosis not present

## 2011-11-13 DIAGNOSIS — IMO0002 Reserved for concepts with insufficient information to code with codable children: Secondary | ICD-10-CM | POA: Diagnosis not present

## 2011-11-15 DIAGNOSIS — R609 Edema, unspecified: Secondary | ICD-10-CM | POA: Diagnosis not present

## 2011-11-15 DIAGNOSIS — Z7901 Long term (current) use of anticoagulants: Secondary | ICD-10-CM | POA: Diagnosis not present

## 2011-11-15 DIAGNOSIS — Z23 Encounter for immunization: Secondary | ICD-10-CM | POA: Diagnosis not present

## 2011-11-15 DIAGNOSIS — C569 Malignant neoplasm of unspecified ovary: Secondary | ICD-10-CM | POA: Diagnosis not present

## 2011-11-15 DIAGNOSIS — M549 Dorsalgia, unspecified: Secondary | ICD-10-CM | POA: Diagnosis not present

## 2011-11-16 DIAGNOSIS — IMO0002 Reserved for concepts with insufficient information to code with codable children: Secondary | ICD-10-CM | POA: Diagnosis not present

## 2011-11-16 DIAGNOSIS — M48061 Spinal stenosis, lumbar region without neurogenic claudication: Secondary | ICD-10-CM | POA: Diagnosis not present

## 2011-11-16 DIAGNOSIS — M545 Low back pain: Secondary | ICD-10-CM | POA: Diagnosis not present

## 2011-11-20 DIAGNOSIS — I82409 Acute embolism and thrombosis of unspecified deep veins of unspecified lower extremity: Secondary | ICD-10-CM | POA: Diagnosis not present

## 2011-11-20 DIAGNOSIS — I959 Hypotension, unspecified: Secondary | ICD-10-CM | POA: Diagnosis not present

## 2011-11-20 DIAGNOSIS — I359 Nonrheumatic aortic valve disorder, unspecified: Secondary | ICD-10-CM | POA: Diagnosis not present

## 2011-11-20 DIAGNOSIS — I10 Essential (primary) hypertension: Secondary | ICD-10-CM | POA: Diagnosis not present

## 2011-11-20 DIAGNOSIS — R42 Dizziness and giddiness: Secondary | ICD-10-CM | POA: Diagnosis not present

## 2011-11-21 DIAGNOSIS — H2510 Age-related nuclear cataract, unspecified eye: Secondary | ICD-10-CM | POA: Diagnosis not present

## 2011-11-21 DIAGNOSIS — H35379 Puckering of macula, unspecified eye: Secondary | ICD-10-CM | POA: Diagnosis not present

## 2011-11-21 DIAGNOSIS — H524 Presbyopia: Secondary | ICD-10-CM | POA: Diagnosis not present

## 2011-11-22 ENCOUNTER — Other Ambulatory Visit: Payer: Self-pay | Admitting: Oncology

## 2011-11-22 DIAGNOSIS — Z853 Personal history of malignant neoplasm of breast: Secondary | ICD-10-CM

## 2011-11-22 DIAGNOSIS — M545 Low back pain: Secondary | ICD-10-CM | POA: Diagnosis not present

## 2011-11-22 DIAGNOSIS — IMO0002 Reserved for concepts with insufficient information to code with codable children: Secondary | ICD-10-CM | POA: Diagnosis not present

## 2011-11-22 DIAGNOSIS — M48061 Spinal stenosis, lumbar region without neurogenic claudication: Secondary | ICD-10-CM | POA: Diagnosis not present

## 2011-11-24 DIAGNOSIS — M545 Low back pain: Secondary | ICD-10-CM | POA: Diagnosis not present

## 2011-11-24 DIAGNOSIS — M48061 Spinal stenosis, lumbar region without neurogenic claudication: Secondary | ICD-10-CM | POA: Diagnosis not present

## 2011-11-24 DIAGNOSIS — IMO0002 Reserved for concepts with insufficient information to code with codable children: Secondary | ICD-10-CM | POA: Diagnosis not present

## 2011-11-27 DIAGNOSIS — M545 Low back pain: Secondary | ICD-10-CM | POA: Diagnosis not present

## 2011-11-27 DIAGNOSIS — IMO0002 Reserved for concepts with insufficient information to code with codable children: Secondary | ICD-10-CM | POA: Diagnosis not present

## 2011-11-27 DIAGNOSIS — M48061 Spinal stenosis, lumbar region without neurogenic claudication: Secondary | ICD-10-CM | POA: Diagnosis not present

## 2011-11-29 DIAGNOSIS — M545 Low back pain: Secondary | ICD-10-CM | POA: Diagnosis not present

## 2011-11-29 DIAGNOSIS — IMO0002 Reserved for concepts with insufficient information to code with codable children: Secondary | ICD-10-CM | POA: Diagnosis not present

## 2011-11-29 DIAGNOSIS — M48061 Spinal stenosis, lumbar region without neurogenic claudication: Secondary | ICD-10-CM | POA: Diagnosis not present

## 2011-12-04 DIAGNOSIS — IMO0002 Reserved for concepts with insufficient information to code with codable children: Secondary | ICD-10-CM | POA: Diagnosis not present

## 2011-12-04 DIAGNOSIS — M545 Low back pain: Secondary | ICD-10-CM | POA: Diagnosis not present

## 2011-12-04 DIAGNOSIS — M48061 Spinal stenosis, lumbar region without neurogenic claudication: Secondary | ICD-10-CM | POA: Diagnosis not present

## 2011-12-05 DIAGNOSIS — IMO0002 Reserved for concepts with insufficient information to code with codable children: Secondary | ICD-10-CM | POA: Diagnosis not present

## 2011-12-05 DIAGNOSIS — M48061 Spinal stenosis, lumbar region without neurogenic claudication: Secondary | ICD-10-CM | POA: Diagnosis not present

## 2011-12-06 DIAGNOSIS — M48061 Spinal stenosis, lumbar region without neurogenic claudication: Secondary | ICD-10-CM | POA: Diagnosis not present

## 2011-12-07 DIAGNOSIS — Z9079 Acquired absence of other genital organ(s): Secondary | ICD-10-CM | POA: Diagnosis not present

## 2011-12-07 DIAGNOSIS — Z87891 Personal history of nicotine dependence: Secondary | ICD-10-CM | POA: Diagnosis not present

## 2011-12-07 DIAGNOSIS — Z09 Encounter for follow-up examination after completed treatment for conditions other than malignant neoplasm: Secondary | ICD-10-CM | POA: Diagnosis not present

## 2011-12-07 DIAGNOSIS — Z90710 Acquired absence of both cervix and uterus: Secondary | ICD-10-CM | POA: Diagnosis not present

## 2011-12-07 DIAGNOSIS — Z7901 Long term (current) use of anticoagulants: Secondary | ICD-10-CM | POA: Diagnosis not present

## 2011-12-07 DIAGNOSIS — Z8543 Personal history of malignant neoplasm of ovary: Secondary | ICD-10-CM | POA: Diagnosis not present

## 2011-12-07 DIAGNOSIS — E119 Type 2 diabetes mellitus without complications: Secondary | ICD-10-CM | POA: Diagnosis not present

## 2011-12-07 DIAGNOSIS — Z808 Family history of malignant neoplasm of other organs or systems: Secondary | ICD-10-CM | POA: Diagnosis not present

## 2011-12-07 DIAGNOSIS — I82409 Acute embolism and thrombosis of unspecified deep veins of unspecified lower extremity: Secondary | ICD-10-CM | POA: Diagnosis not present

## 2011-12-07 DIAGNOSIS — E785 Hyperlipidemia, unspecified: Secondary | ICD-10-CM | POA: Diagnosis not present

## 2011-12-21 DIAGNOSIS — IMO0002 Reserved for concepts with insufficient information to code with codable children: Secondary | ICD-10-CM | POA: Diagnosis not present

## 2011-12-21 DIAGNOSIS — M48061 Spinal stenosis, lumbar region without neurogenic claudication: Secondary | ICD-10-CM | POA: Diagnosis not present

## 2011-12-27 ENCOUNTER — Ambulatory Visit
Admission: RE | Admit: 2011-12-27 | Discharge: 2011-12-27 | Disposition: A | Discharge status: Home / Self Care | Payer: Medicare Other | Source: Ambulatory Visit | Attending: Oncology | Admitting: Oncology

## 2011-12-27 DIAGNOSIS — Z853 Personal history of malignant neoplasm of breast: Secondary | ICD-10-CM | POA: Diagnosis not present

## 2011-12-28 DIAGNOSIS — M48061 Spinal stenosis, lumbar region without neurogenic claudication: Secondary | ICD-10-CM | POA: Diagnosis not present

## 2011-12-28 DIAGNOSIS — IMO0002 Reserved for concepts with insufficient information to code with codable children: Secondary | ICD-10-CM | POA: Diagnosis not present

## 2012-01-16 DIAGNOSIS — M48061 Spinal stenosis, lumbar region without neurogenic claudication: Secondary | ICD-10-CM | POA: Diagnosis not present

## 2012-01-16 DIAGNOSIS — IMO0002 Reserved for concepts with insufficient information to code with codable children: Secondary | ICD-10-CM | POA: Diagnosis not present

## 2012-01-18 DIAGNOSIS — I82409 Acute embolism and thrombosis of unspecified deep veins of unspecified lower extremity: Secondary | ICD-10-CM | POA: Diagnosis not present

## 2012-01-18 DIAGNOSIS — G589 Mononeuropathy, unspecified: Secondary | ICD-10-CM | POA: Diagnosis not present

## 2012-01-18 DIAGNOSIS — Z87442 Personal history of urinary calculi: Secondary | ICD-10-CM | POA: Diagnosis not present

## 2012-01-18 DIAGNOSIS — E785 Hyperlipidemia, unspecified: Secondary | ICD-10-CM | POA: Diagnosis not present

## 2012-01-18 DIAGNOSIS — R232 Flushing: Secondary | ICD-10-CM | POA: Diagnosis not present

## 2012-01-18 DIAGNOSIS — Z86010 Personal history of colonic polyps: Secondary | ICD-10-CM | POA: Diagnosis not present

## 2012-01-18 DIAGNOSIS — R142 Eructation: Secondary | ICD-10-CM | POA: Diagnosis not present

## 2012-01-18 DIAGNOSIS — Z9079 Acquired absence of other genital organ(s): Secondary | ICD-10-CM | POA: Diagnosis not present

## 2012-01-18 DIAGNOSIS — Z90710 Acquired absence of both cervix and uterus: Secondary | ICD-10-CM | POA: Diagnosis not present

## 2012-01-18 DIAGNOSIS — Z09 Encounter for follow-up examination after completed treatment for conditions other than malignant neoplasm: Secondary | ICD-10-CM | POA: Diagnosis not present

## 2012-01-18 DIAGNOSIS — Z800 Family history of malignant neoplasm of digestive organs: Secondary | ICD-10-CM | POA: Diagnosis not present

## 2012-01-18 DIAGNOSIS — R011 Cardiac murmur, unspecified: Secondary | ICD-10-CM | POA: Diagnosis not present

## 2012-01-18 DIAGNOSIS — Z87898 Personal history of other specified conditions: Secondary | ICD-10-CM | POA: Diagnosis not present

## 2012-01-18 DIAGNOSIS — Z9221 Personal history of antineoplastic chemotherapy: Secondary | ICD-10-CM | POA: Diagnosis not present

## 2012-01-18 DIAGNOSIS — Z8052 Family history of malignant neoplasm of bladder: Secondary | ICD-10-CM | POA: Diagnosis not present

## 2012-01-18 DIAGNOSIS — R609 Edema, unspecified: Secondary | ICD-10-CM | POA: Diagnosis not present

## 2012-01-18 DIAGNOSIS — Z7982 Long term (current) use of aspirin: Secondary | ICD-10-CM | POA: Diagnosis not present

## 2012-01-18 DIAGNOSIS — Z806 Family history of leukemia: Secondary | ICD-10-CM | POA: Diagnosis not present

## 2012-01-18 DIAGNOSIS — Z79899 Other long term (current) drug therapy: Secondary | ICD-10-CM | POA: Diagnosis not present

## 2012-01-18 DIAGNOSIS — E119 Type 2 diabetes mellitus without complications: Secondary | ICD-10-CM | POA: Diagnosis not present

## 2012-01-18 DIAGNOSIS — Z8543 Personal history of malignant neoplasm of ovary: Secondary | ICD-10-CM | POA: Diagnosis not present

## 2012-01-18 DIAGNOSIS — Z7901 Long term (current) use of anticoagulants: Secondary | ICD-10-CM | POA: Diagnosis not present

## 2012-02-09 DIAGNOSIS — E782 Mixed hyperlipidemia: Secondary | ICD-10-CM | POA: Diagnosis not present

## 2012-02-14 DIAGNOSIS — E119 Type 2 diabetes mellitus without complications: Secondary | ICD-10-CM | POA: Diagnosis not present

## 2012-02-14 DIAGNOSIS — E7800 Pure hypercholesterolemia, unspecified: Secondary | ICD-10-CM | POA: Diagnosis not present

## 2012-02-14 DIAGNOSIS — Z1331 Encounter for screening for depression: Secondary | ICD-10-CM | POA: Diagnosis not present

## 2012-02-14 DIAGNOSIS — I10 Essential (primary) hypertension: Secondary | ICD-10-CM | POA: Diagnosis not present

## 2012-02-29 DIAGNOSIS — Z452 Encounter for adjustment and management of vascular access device: Secondary | ICD-10-CM | POA: Diagnosis not present

## 2012-02-29 DIAGNOSIS — C569 Malignant neoplasm of unspecified ovary: Secondary | ICD-10-CM | POA: Diagnosis not present

## 2012-03-02 DIAGNOSIS — J019 Acute sinusitis, unspecified: Secondary | ICD-10-CM | POA: Diagnosis not present

## 2012-04-04 ENCOUNTER — Telehealth: Payer: Self-pay | Admitting: *Deleted

## 2012-04-04 NOTE — Telephone Encounter (Signed)
Left message for a return call to reschedule appt.

## 2012-04-10 ENCOUNTER — Telehealth: Payer: Self-pay | Admitting: *Deleted

## 2012-04-10 NOTE — Telephone Encounter (Signed)
Called and spoke with patient to reschedule her appt. Patient states that she has recently had ovarian cancer and is seeing a MD at [facility] and they are referring her to one of their breast medical oncologist, so she will be transferring her care there.  Wished patient well and informed her that if we could do anything for her to let us know.

## 2012-04-10 NOTE — Telephone Encounter (Signed)
Left message for a return phone call to reschedule patient's appt. 

## 2012-04-19 ENCOUNTER — Other Ambulatory Visit: Payer: Medicare Other | Admitting: Lab

## 2012-04-26 ENCOUNTER — Ambulatory Visit: Payer: Medicare Other | Admitting: Oncology

## 2012-05-02 DIAGNOSIS — Z9079 Acquired absence of other genital organ(s): Secondary | ICD-10-CM | POA: Diagnosis not present

## 2012-05-02 DIAGNOSIS — Z9221 Personal history of antineoplastic chemotherapy: Secondary | ICD-10-CM | POA: Diagnosis not present

## 2012-05-02 DIAGNOSIS — Z806 Family history of leukemia: Secondary | ICD-10-CM | POA: Diagnosis not present

## 2012-05-02 DIAGNOSIS — Z90710 Acquired absence of both cervix and uterus: Secondary | ICD-10-CM | POA: Diagnosis not present

## 2012-05-02 DIAGNOSIS — Z800 Family history of malignant neoplasm of digestive organs: Secondary | ICD-10-CM | POA: Diagnosis not present

## 2012-05-02 DIAGNOSIS — Z452 Encounter for adjustment and management of vascular access device: Secondary | ICD-10-CM | POA: Diagnosis not present

## 2012-05-02 DIAGNOSIS — Z09 Encounter for follow-up examination after completed treatment for conditions other than malignant neoplasm: Secondary | ICD-10-CM | POA: Diagnosis not present

## 2012-05-02 DIAGNOSIS — Z8052 Family history of malignant neoplasm of bladder: Secondary | ICD-10-CM | POA: Diagnosis not present

## 2012-05-02 DIAGNOSIS — Z86718 Personal history of other venous thrombosis and embolism: Secondary | ICD-10-CM | POA: Diagnosis not present

## 2012-05-02 DIAGNOSIS — Z7901 Long term (current) use of anticoagulants: Secondary | ICD-10-CM | POA: Diagnosis not present

## 2012-05-02 DIAGNOSIS — Z87891 Personal history of nicotine dependence: Secondary | ICD-10-CM | POA: Diagnosis not present

## 2012-05-02 DIAGNOSIS — Z8543 Personal history of malignant neoplasm of ovary: Secondary | ICD-10-CM | POA: Diagnosis not present

## 2012-05-09 DIAGNOSIS — Z452 Encounter for adjustment and management of vascular access device: Secondary | ICD-10-CM | POA: Diagnosis not present

## 2012-05-09 DIAGNOSIS — Z7901 Long term (current) use of anticoagulants: Secondary | ICD-10-CM | POA: Diagnosis not present

## 2012-05-09 DIAGNOSIS — Z8543 Personal history of malignant neoplasm of ovary: Secondary | ICD-10-CM | POA: Diagnosis not present

## 2012-05-09 DIAGNOSIS — Z09 Encounter for follow-up examination after completed treatment for conditions other than malignant neoplasm: Secondary | ICD-10-CM | POA: Diagnosis not present

## 2012-05-09 DIAGNOSIS — Z9079 Acquired absence of other genital organ(s): Secondary | ICD-10-CM | POA: Diagnosis not present

## 2012-05-09 DIAGNOSIS — Z9221 Personal history of antineoplastic chemotherapy: Secondary | ICD-10-CM | POA: Diagnosis not present

## 2012-05-09 DIAGNOSIS — Z90710 Acquired absence of both cervix and uterus: Secondary | ICD-10-CM | POA: Diagnosis not present

## 2012-05-09 DIAGNOSIS — Z87891 Personal history of nicotine dependence: Secondary | ICD-10-CM | POA: Diagnosis not present

## 2012-05-25 ENCOUNTER — Telehealth: Payer: Self-pay | Admitting: Oncology

## 2012-05-25 NOTE — Telephone Encounter (Signed)
PR pt. S/w pt today re f/u appt w/new provider and per pt she has already made other arrangements and is being followed elsewhere. Pt also states she has informed our office of this.

## 2012-06-13 DIAGNOSIS — C569 Malignant neoplasm of unspecified ovary: Secondary | ICD-10-CM | POA: Diagnosis not present

## 2012-06-13 DIAGNOSIS — Z452 Encounter for adjustment and management of vascular access device: Secondary | ICD-10-CM | POA: Diagnosis not present

## 2012-08-01 DIAGNOSIS — Z9221 Personal history of antineoplastic chemotherapy: Secondary | ICD-10-CM | POA: Diagnosis not present

## 2012-08-01 DIAGNOSIS — Z7982 Long term (current) use of aspirin: Secondary | ICD-10-CM | POA: Diagnosis not present

## 2012-08-01 DIAGNOSIS — Z9079 Acquired absence of other genital organ(s): Secondary | ICD-10-CM | POA: Diagnosis not present

## 2012-08-01 DIAGNOSIS — Z87442 Personal history of urinary calculi: Secondary | ICD-10-CM | POA: Diagnosis not present

## 2012-08-01 DIAGNOSIS — Z800 Family history of malignant neoplasm of digestive organs: Secondary | ICD-10-CM | POA: Diagnosis not present

## 2012-08-01 DIAGNOSIS — Z806 Family history of leukemia: Secondary | ICD-10-CM | POA: Diagnosis not present

## 2012-08-01 DIAGNOSIS — Z7901 Long term (current) use of anticoagulants: Secondary | ICD-10-CM | POA: Diagnosis not present

## 2012-08-01 DIAGNOSIS — C569 Malignant neoplasm of unspecified ovary: Secondary | ICD-10-CM | POA: Diagnosis not present

## 2012-08-01 DIAGNOSIS — Z86718 Personal history of other venous thrombosis and embolism: Secondary | ICD-10-CM | POA: Diagnosis not present

## 2012-08-01 DIAGNOSIS — Z853 Personal history of malignant neoplasm of breast: Secondary | ICD-10-CM | POA: Diagnosis not present

## 2012-08-01 DIAGNOSIS — Z90710 Acquired absence of both cervix and uterus: Secondary | ICD-10-CM | POA: Diagnosis not present

## 2012-08-01 DIAGNOSIS — Z8052 Family history of malignant neoplasm of bladder: Secondary | ICD-10-CM | POA: Diagnosis not present

## 2012-08-01 DIAGNOSIS — Z9010 Acquired absence of unspecified breast and nipple: Secondary | ICD-10-CM | POA: Diagnosis not present

## 2012-08-07 DIAGNOSIS — C569 Malignant neoplasm of unspecified ovary: Secondary | ICD-10-CM | POA: Diagnosis not present

## 2012-08-07 DIAGNOSIS — C50919 Malignant neoplasm of unspecified site of unspecified female breast: Secondary | ICD-10-CM | POA: Diagnosis not present

## 2012-08-07 DIAGNOSIS — D3910 Neoplasm of uncertain behavior of unspecified ovary: Secondary | ICD-10-CM | POA: Diagnosis not present

## 2012-08-07 DIAGNOSIS — Z452 Encounter for adjustment and management of vascular access device: Secondary | ICD-10-CM | POA: Diagnosis not present

## 2012-08-13 DIAGNOSIS — I10 Essential (primary) hypertension: Secondary | ICD-10-CM | POA: Diagnosis not present

## 2012-08-13 DIAGNOSIS — C569 Malignant neoplasm of unspecified ovary: Secondary | ICD-10-CM | POA: Diagnosis not present

## 2012-08-13 DIAGNOSIS — C50919 Malignant neoplasm of unspecified site of unspecified female breast: Secondary | ICD-10-CM | POA: Diagnosis not present

## 2012-08-13 DIAGNOSIS — E559 Vitamin D deficiency, unspecified: Secondary | ICD-10-CM | POA: Diagnosis not present

## 2012-08-13 DIAGNOSIS — F329 Major depressive disorder, single episode, unspecified: Secondary | ICD-10-CM | POA: Diagnosis not present

## 2012-08-13 DIAGNOSIS — E119 Type 2 diabetes mellitus without complications: Secondary | ICD-10-CM | POA: Diagnosis not present

## 2012-08-13 DIAGNOSIS — E7800 Pure hypercholesterolemia, unspecified: Secondary | ICD-10-CM | POA: Diagnosis not present

## 2012-10-08 DIAGNOSIS — C569 Malignant neoplasm of unspecified ovary: Secondary | ICD-10-CM | POA: Diagnosis not present

## 2012-10-08 DIAGNOSIS — Z86010 Personal history of colonic polyps: Secondary | ICD-10-CM | POA: Diagnosis not present

## 2012-10-08 DIAGNOSIS — I82409 Acute embolism and thrombosis of unspecified deep veins of unspecified lower extremity: Secondary | ICD-10-CM | POA: Diagnosis not present

## 2012-10-31 DIAGNOSIS — Z87891 Personal history of nicotine dependence: Secondary | ICD-10-CM | POA: Diagnosis not present

## 2012-10-31 DIAGNOSIS — Z86718 Personal history of other venous thrombosis and embolism: Secondary | ICD-10-CM | POA: Diagnosis not present

## 2012-10-31 DIAGNOSIS — Z86010 Personal history of colonic polyps: Secondary | ICD-10-CM | POA: Diagnosis not present

## 2012-10-31 DIAGNOSIS — Z87442 Personal history of urinary calculi: Secondary | ICD-10-CM | POA: Diagnosis not present

## 2012-10-31 DIAGNOSIS — C569 Malignant neoplasm of unspecified ovary: Secondary | ICD-10-CM | POA: Diagnosis not present

## 2012-10-31 DIAGNOSIS — E119 Type 2 diabetes mellitus without complications: Secondary | ICD-10-CM | POA: Diagnosis not present

## 2012-11-22 DIAGNOSIS — H5200 Hypermetropia, unspecified eye: Secondary | ICD-10-CM | POA: Diagnosis not present

## 2012-11-22 DIAGNOSIS — H35039 Hypertensive retinopathy, unspecified eye: Secondary | ICD-10-CM | POA: Diagnosis not present

## 2012-11-22 DIAGNOSIS — H2510 Age-related nuclear cataract, unspecified eye: Secondary | ICD-10-CM | POA: Diagnosis not present

## 2012-12-17 ENCOUNTER — Other Ambulatory Visit: Payer: Self-pay

## 2012-12-17 DIAGNOSIS — Z1231 Encounter for screening mammogram for malignant neoplasm of breast: Secondary | ICD-10-CM

## 2012-12-27 DIAGNOSIS — E7800 Pure hypercholesterolemia, unspecified: Secondary | ICD-10-CM | POA: Diagnosis not present

## 2012-12-27 DIAGNOSIS — C569 Malignant neoplasm of unspecified ovary: Secondary | ICD-10-CM | POA: Diagnosis not present

## 2012-12-27 DIAGNOSIS — E119 Type 2 diabetes mellitus without complications: Secondary | ICD-10-CM | POA: Diagnosis not present

## 2012-12-27 DIAGNOSIS — C50919 Malignant neoplasm of unspecified site of unspecified female breast: Secondary | ICD-10-CM | POA: Diagnosis not present

## 2012-12-27 DIAGNOSIS — Z23 Encounter for immunization: Secondary | ICD-10-CM | POA: Diagnosis not present

## 2012-12-27 DIAGNOSIS — I10 Essential (primary) hypertension: Secondary | ICD-10-CM | POA: Diagnosis not present

## 2012-12-27 DIAGNOSIS — E559 Vitamin D deficiency, unspecified: Secondary | ICD-10-CM | POA: Diagnosis not present

## 2012-12-30 ENCOUNTER — Other Ambulatory Visit: Payer: Self-pay | Admitting: Gastroenterology

## 2012-12-30 DIAGNOSIS — Z86010 Personal history of colonic polyps: Secondary | ICD-10-CM | POA: Diagnosis not present

## 2012-12-30 DIAGNOSIS — D126 Benign neoplasm of colon, unspecified: Secondary | ICD-10-CM | POA: Diagnosis not present

## 2012-12-30 DIAGNOSIS — K648 Other hemorrhoids: Secondary | ICD-10-CM | POA: Diagnosis not present

## 2012-12-30 DIAGNOSIS — Z09 Encounter for follow-up examination after completed treatment for conditions other than malignant neoplasm: Secondary | ICD-10-CM | POA: Diagnosis not present

## 2013-01-10 ENCOUNTER — Ambulatory Visit
Admission: RE | Admit: 2013-01-10 | Discharge: 2013-01-10 | Disposition: A | Discharge status: Home / Self Care | Payer: Medicare Other | Source: Ambulatory Visit

## 2013-01-10 DIAGNOSIS — Z1231 Encounter for screening mammogram for malignant neoplasm of breast: Secondary | ICD-10-CM

## 2013-01-10 DIAGNOSIS — Z853 Personal history of malignant neoplasm of breast: Secondary | ICD-10-CM | POA: Diagnosis not present

## 2013-01-23 DIAGNOSIS — Z800 Family history of malignant neoplasm of digestive organs: Secondary | ICD-10-CM | POA: Diagnosis not present

## 2013-01-23 DIAGNOSIS — Z0000 Encounter for general adult medical examination without abnormal findings: Secondary | ICD-10-CM | POA: Diagnosis not present

## 2013-01-23 DIAGNOSIS — R197 Diarrhea, unspecified: Secondary | ICD-10-CM | POA: Diagnosis not present

## 2013-01-23 DIAGNOSIS — M549 Dorsalgia, unspecified: Secondary | ICD-10-CM | POA: Diagnosis not present

## 2013-01-23 DIAGNOSIS — Z9079 Acquired absence of other genital organ(s): Secondary | ICD-10-CM | POA: Diagnosis not present

## 2013-01-23 DIAGNOSIS — D126 Benign neoplasm of colon, unspecified: Secondary | ICD-10-CM | POA: Diagnosis not present

## 2013-01-23 DIAGNOSIS — Z806 Family history of leukemia: Secondary | ICD-10-CM | POA: Diagnosis not present

## 2013-01-23 DIAGNOSIS — E785 Hyperlipidemia, unspecified: Secondary | ICD-10-CM | POA: Diagnosis not present

## 2013-01-23 DIAGNOSIS — G620 Drug-induced polyneuropathy: Secondary | ICD-10-CM | POA: Diagnosis not present

## 2013-01-23 DIAGNOSIS — R198 Other specified symptoms and signs involving the digestive system and abdomen: Secondary | ICD-10-CM | POA: Diagnosis not present

## 2013-01-23 DIAGNOSIS — Z79899 Other long term (current) drug therapy: Secondary | ICD-10-CM | POA: Diagnosis not present

## 2013-01-23 DIAGNOSIS — C569 Malignant neoplasm of unspecified ovary: Secondary | ICD-10-CM | POA: Diagnosis not present

## 2013-01-23 DIAGNOSIS — Z853 Personal history of malignant neoplasm of breast: Secondary | ICD-10-CM | POA: Diagnosis not present

## 2013-01-23 DIAGNOSIS — N200 Calculus of kidney: Secondary | ICD-10-CM | POA: Diagnosis not present

## 2013-01-23 DIAGNOSIS — E119 Type 2 diabetes mellitus without complications: Secondary | ICD-10-CM | POA: Diagnosis not present

## 2013-01-23 DIAGNOSIS — Z90710 Acquired absence of both cervix and uterus: Secondary | ICD-10-CM | POA: Diagnosis not present

## 2013-01-23 DIAGNOSIS — Z8052 Family history of malignant neoplasm of bladder: Secondary | ICD-10-CM | POA: Diagnosis not present

## 2013-03-24 DIAGNOSIS — E7800 Pure hypercholesterolemia, unspecified: Secondary | ICD-10-CM | POA: Diagnosis not present

## 2013-03-24 DIAGNOSIS — E119 Type 2 diabetes mellitus without complications: Secondary | ICD-10-CM | POA: Diagnosis not present

## 2013-03-24 DIAGNOSIS — E559 Vitamin D deficiency, unspecified: Secondary | ICD-10-CM | POA: Diagnosis not present

## 2013-03-24 DIAGNOSIS — I10 Essential (primary) hypertension: Secondary | ICD-10-CM | POA: Diagnosis not present

## 2013-03-28 DIAGNOSIS — F3289 Other specified depressive episodes: Secondary | ICD-10-CM | POA: Diagnosis not present

## 2013-03-28 DIAGNOSIS — IMO0001 Reserved for inherently not codable concepts without codable children: Secondary | ICD-10-CM | POA: Diagnosis not present

## 2013-03-28 DIAGNOSIS — I10 Essential (primary) hypertension: Secondary | ICD-10-CM | POA: Diagnosis not present

## 2013-03-28 DIAGNOSIS — Z1331 Encounter for screening for depression: Secondary | ICD-10-CM | POA: Diagnosis not present

## 2013-03-28 DIAGNOSIS — E7800 Pure hypercholesterolemia, unspecified: Secondary | ICD-10-CM | POA: Diagnosis not present

## 2013-03-28 DIAGNOSIS — F329 Major depressive disorder, single episode, unspecified: Secondary | ICD-10-CM | POA: Diagnosis not present

## 2013-04-24 DIAGNOSIS — Z9079 Acquired absence of other genital organ(s): Secondary | ICD-10-CM | POA: Diagnosis not present

## 2013-04-24 DIAGNOSIS — Z9221 Personal history of antineoplastic chemotherapy: Secondary | ICD-10-CM | POA: Diagnosis not present

## 2013-04-24 DIAGNOSIS — Z794 Long term (current) use of insulin: Secondary | ICD-10-CM | POA: Diagnosis not present

## 2013-04-24 DIAGNOSIS — Z8543 Personal history of malignant neoplasm of ovary: Secondary | ICD-10-CM | POA: Diagnosis not present

## 2013-04-24 DIAGNOSIS — Z800 Family history of malignant neoplasm of digestive organs: Secondary | ICD-10-CM | POA: Diagnosis not present

## 2013-04-24 DIAGNOSIS — M549 Dorsalgia, unspecified: Secondary | ICD-10-CM | POA: Diagnosis not present

## 2013-04-24 DIAGNOSIS — Z87891 Personal history of nicotine dependence: Secondary | ICD-10-CM | POA: Diagnosis not present

## 2013-04-24 DIAGNOSIS — Z90710 Acquired absence of both cervix and uterus: Secondary | ICD-10-CM | POA: Diagnosis not present

## 2013-04-24 DIAGNOSIS — Z806 Family history of leukemia: Secondary | ICD-10-CM | POA: Diagnosis not present

## 2013-04-24 DIAGNOSIS — C569 Malignant neoplasm of unspecified ovary: Secondary | ICD-10-CM | POA: Diagnosis not present

## 2013-04-24 DIAGNOSIS — Z8052 Family history of malignant neoplasm of bladder: Secondary | ICD-10-CM | POA: Diagnosis not present

## 2013-04-24 DIAGNOSIS — Z09 Encounter for follow-up examination after completed treatment for conditions other than malignant neoplasm: Secondary | ICD-10-CM | POA: Diagnosis not present

## 2013-05-15 DIAGNOSIS — Z9079 Acquired absence of other genital organ(s): Secondary | ICD-10-CM | POA: Diagnosis not present

## 2013-05-15 DIAGNOSIS — C50919 Malignant neoplasm of unspecified site of unspecified female breast: Secondary | ICD-10-CM | POA: Diagnosis not present

## 2013-05-15 DIAGNOSIS — Z8543 Personal history of malignant neoplasm of ovary: Secondary | ICD-10-CM | POA: Diagnosis not present

## 2013-05-15 DIAGNOSIS — F411 Generalized anxiety disorder: Secondary | ICD-10-CM | POA: Diagnosis not present

## 2013-05-15 DIAGNOSIS — C569 Malignant neoplasm of unspecified ovary: Secondary | ICD-10-CM | POA: Diagnosis not present

## 2013-05-15 DIAGNOSIS — Z853 Personal history of malignant neoplasm of breast: Secondary | ICD-10-CM | POA: Diagnosis not present

## 2013-05-15 DIAGNOSIS — Z79899 Other long term (current) drug therapy: Secondary | ICD-10-CM | POA: Diagnosis not present

## 2013-05-15 DIAGNOSIS — K635 Polyp of colon: Secondary | ICD-10-CM | POA: Insufficient documentation

## 2013-05-15 DIAGNOSIS — Z86718 Personal history of other venous thrombosis and embolism: Secondary | ICD-10-CM | POA: Diagnosis not present

## 2013-05-15 DIAGNOSIS — F419 Anxiety disorder, unspecified: Secondary | ICD-10-CM | POA: Insufficient documentation

## 2013-05-15 DIAGNOSIS — M4800 Spinal stenosis, site unspecified: Secondary | ICD-10-CM | POA: Insufficient documentation

## 2013-05-15 DIAGNOSIS — E119 Type 2 diabetes mellitus without complications: Secondary | ICD-10-CM | POA: Diagnosis not present

## 2013-07-24 DIAGNOSIS — Z87891 Personal history of nicotine dependence: Secondary | ICD-10-CM | POA: Diagnosis not present

## 2013-07-24 DIAGNOSIS — M549 Dorsalgia, unspecified: Secondary | ICD-10-CM | POA: Diagnosis not present

## 2013-07-24 DIAGNOSIS — D126 Benign neoplasm of colon, unspecified: Secondary | ICD-10-CM | POA: Diagnosis not present

## 2013-07-24 DIAGNOSIS — Z806 Family history of leukemia: Secondary | ICD-10-CM | POA: Diagnosis not present

## 2013-07-24 DIAGNOSIS — G620 Drug-induced polyneuropathy: Secondary | ICD-10-CM | POA: Diagnosis not present

## 2013-07-24 DIAGNOSIS — E119 Type 2 diabetes mellitus without complications: Secondary | ICD-10-CM | POA: Diagnosis not present

## 2013-07-24 DIAGNOSIS — E785 Hyperlipidemia, unspecified: Secondary | ICD-10-CM | POA: Diagnosis not present

## 2013-07-24 DIAGNOSIS — Z9079 Acquired absence of other genital organ(s): Secondary | ICD-10-CM | POA: Diagnosis not present

## 2013-07-24 DIAGNOSIS — C569 Malignant neoplasm of unspecified ovary: Secondary | ICD-10-CM | POA: Diagnosis not present

## 2013-07-24 DIAGNOSIS — Z800 Family history of malignant neoplasm of digestive organs: Secondary | ICD-10-CM | POA: Diagnosis not present

## 2013-07-24 DIAGNOSIS — Z86718 Personal history of other venous thrombosis and embolism: Secondary | ICD-10-CM | POA: Diagnosis not present

## 2013-07-24 DIAGNOSIS — Z8052 Family history of malignant neoplasm of bladder: Secondary | ICD-10-CM | POA: Diagnosis not present

## 2013-07-24 DIAGNOSIS — Z87442 Personal history of urinary calculi: Secondary | ICD-10-CM | POA: Diagnosis not present

## 2013-07-24 DIAGNOSIS — Z79899 Other long term (current) drug therapy: Secondary | ICD-10-CM | POA: Diagnosis not present

## 2013-09-18 DIAGNOSIS — E7800 Pure hypercholesterolemia, unspecified: Secondary | ICD-10-CM | POA: Diagnosis not present

## 2013-09-18 DIAGNOSIS — IMO0001 Reserved for inherently not codable concepts without codable children: Secondary | ICD-10-CM | POA: Diagnosis not present

## 2013-09-25 DIAGNOSIS — Z8543 Personal history of malignant neoplasm of ovary: Secondary | ICD-10-CM | POA: Diagnosis not present

## 2013-09-25 DIAGNOSIS — E7800 Pure hypercholesterolemia, unspecified: Secondary | ICD-10-CM | POA: Diagnosis not present

## 2013-09-25 DIAGNOSIS — Z86718 Personal history of other venous thrombosis and embolism: Secondary | ICD-10-CM | POA: Diagnosis not present

## 2013-09-25 DIAGNOSIS — I10 Essential (primary) hypertension: Secondary | ICD-10-CM | POA: Diagnosis not present

## 2013-09-25 DIAGNOSIS — E119 Type 2 diabetes mellitus without complications: Secondary | ICD-10-CM | POA: Diagnosis not present

## 2013-09-25 DIAGNOSIS — Z23 Encounter for immunization: Secondary | ICD-10-CM | POA: Diagnosis not present

## 2013-09-25 DIAGNOSIS — Z853 Personal history of malignant neoplasm of breast: Secondary | ICD-10-CM | POA: Diagnosis not present

## 2013-09-30 ENCOUNTER — Ambulatory Visit
Admission: RE | Admit: 2013-09-30 | Discharge: 2013-09-30 | Disposition: A | Discharge status: Home / Self Care | Payer: Medicare Other | Source: Ambulatory Visit | Attending: Family Medicine | Admitting: Family Medicine

## 2013-09-30 ENCOUNTER — Other Ambulatory Visit: Payer: Self-pay | Admitting: Family Medicine

## 2013-09-30 DIAGNOSIS — M545 Low back pain, unspecified: Secondary | ICD-10-CM

## 2013-09-30 DIAGNOSIS — M5137 Other intervertebral disc degeneration, lumbosacral region: Secondary | ICD-10-CM | POA: Diagnosis not present

## 2013-10-15 DIAGNOSIS — M545 Low back pain, unspecified: Secondary | ICD-10-CM | POA: Diagnosis not present

## 2013-10-20 DIAGNOSIS — M545 Low back pain, unspecified: Secondary | ICD-10-CM | POA: Diagnosis not present

## 2013-10-20 DIAGNOSIS — IMO0002 Reserved for concepts with insufficient information to code with codable children: Secondary | ICD-10-CM | POA: Diagnosis not present

## 2013-10-23 DIAGNOSIS — IMO0002 Reserved for concepts with insufficient information to code with codable children: Secondary | ICD-10-CM | POA: Diagnosis not present

## 2013-10-23 DIAGNOSIS — M545 Low back pain, unspecified: Secondary | ICD-10-CM | POA: Diagnosis not present

## 2013-10-29 DIAGNOSIS — M545 Low back pain, unspecified: Secondary | ICD-10-CM | POA: Diagnosis not present

## 2013-10-30 DIAGNOSIS — M545 Low back pain, unspecified: Secondary | ICD-10-CM | POA: Diagnosis not present

## 2013-10-30 DIAGNOSIS — IMO0002 Reserved for concepts with insufficient information to code with codable children: Secondary | ICD-10-CM | POA: Diagnosis not present

## 2013-10-31 ENCOUNTER — Other Ambulatory Visit: Payer: Self-pay | Admitting: Orthopaedic Surgery

## 2013-10-31 DIAGNOSIS — M545 Low back pain, unspecified: Secondary | ICD-10-CM

## 2013-11-03 DIAGNOSIS — M545 Low back pain, unspecified: Secondary | ICD-10-CM | POA: Diagnosis not present

## 2013-11-03 DIAGNOSIS — IMO0002 Reserved for concepts with insufficient information to code with codable children: Secondary | ICD-10-CM | POA: Diagnosis not present

## 2013-11-05 DIAGNOSIS — M545 Low back pain, unspecified: Secondary | ICD-10-CM | POA: Diagnosis not present

## 2013-11-05 DIAGNOSIS — IMO0002 Reserved for concepts with insufficient information to code with codable children: Secondary | ICD-10-CM | POA: Diagnosis not present

## 2013-11-07 ENCOUNTER — Ambulatory Visit
Admission: RE | Admit: 2013-11-07 | Discharge: 2013-11-07 | Disposition: A | Discharge status: Home / Self Care | Payer: Medicare Other | Source: Ambulatory Visit | Attending: Orthopaedic Surgery | Admitting: Orthopaedic Surgery

## 2013-11-07 DIAGNOSIS — M545 Low back pain, unspecified: Secondary | ICD-10-CM

## 2013-11-07 DIAGNOSIS — M5137 Other intervertebral disc degeneration, lumbosacral region: Secondary | ICD-10-CM | POA: Diagnosis not present

## 2013-11-07 DIAGNOSIS — M5126 Other intervertebral disc displacement, lumbar region: Secondary | ICD-10-CM | POA: Diagnosis not present

## 2013-11-07 DIAGNOSIS — M47817 Spondylosis without myelopathy or radiculopathy, lumbosacral region: Secondary | ICD-10-CM | POA: Diagnosis not present

## 2013-11-07 DIAGNOSIS — Z8543 Personal history of malignant neoplasm of ovary: Secondary | ICD-10-CM | POA: Diagnosis not present

## 2013-11-11 DIAGNOSIS — IMO0002 Reserved for concepts with insufficient information to code with codable children: Secondary | ICD-10-CM | POA: Diagnosis not present

## 2013-11-11 DIAGNOSIS — M545 Low back pain, unspecified: Secondary | ICD-10-CM | POA: Diagnosis not present

## 2013-11-12 DIAGNOSIS — M48061 Spinal stenosis, lumbar region without neurogenic claudication: Secondary | ICD-10-CM | POA: Diagnosis not present

## 2013-11-13 DIAGNOSIS — IMO0002 Reserved for concepts with insufficient information to code with codable children: Secondary | ICD-10-CM | POA: Diagnosis not present

## 2013-11-13 DIAGNOSIS — M545 Low back pain, unspecified: Secondary | ICD-10-CM | POA: Diagnosis not present

## 2013-11-18 DIAGNOSIS — M545 Low back pain, unspecified: Secondary | ICD-10-CM | POA: Diagnosis not present

## 2013-11-18 DIAGNOSIS — IMO0002 Reserved for concepts with insufficient information to code with codable children: Secondary | ICD-10-CM | POA: Diagnosis not present

## 2013-11-20 DIAGNOSIS — IMO0002 Reserved for concepts with insufficient information to code with codable children: Secondary | ICD-10-CM | POA: Diagnosis not present

## 2013-11-20 DIAGNOSIS — M545 Low back pain, unspecified: Secondary | ICD-10-CM | POA: Diagnosis not present

## 2013-11-24 DIAGNOSIS — M545 Low back pain, unspecified: Secondary | ICD-10-CM | POA: Diagnosis not present

## 2013-11-24 DIAGNOSIS — IMO0002 Reserved for concepts with insufficient information to code with codable children: Secondary | ICD-10-CM | POA: Diagnosis not present

## 2013-11-27 DIAGNOSIS — IMO0002 Reserved for concepts with insufficient information to code with codable children: Secondary | ICD-10-CM | POA: Diagnosis not present

## 2013-11-27 DIAGNOSIS — M545 Low back pain, unspecified: Secondary | ICD-10-CM | POA: Diagnosis not present

## 2013-12-02 DIAGNOSIS — M545 Low back pain, unspecified: Secondary | ICD-10-CM | POA: Diagnosis not present

## 2013-12-02 DIAGNOSIS — IMO0002 Reserved for concepts with insufficient information to code with codable children: Secondary | ICD-10-CM | POA: Diagnosis not present

## 2013-12-04 DIAGNOSIS — M9953 Intervertebral disc stenosis of neural canal of lumbar region: Secondary | ICD-10-CM | POA: Diagnosis not present

## 2013-12-09 DIAGNOSIS — M4806 Spinal stenosis, lumbar region: Secondary | ICD-10-CM | POA: Diagnosis not present

## 2013-12-09 DIAGNOSIS — M9953 Intervertebral disc stenosis of neural canal of lumbar region: Secondary | ICD-10-CM | POA: Diagnosis not present

## 2013-12-11 DIAGNOSIS — M9953 Intervertebral disc stenosis of neural canal of lumbar region: Secondary | ICD-10-CM | POA: Diagnosis not present

## 2013-12-12 ENCOUNTER — Other Ambulatory Visit: Payer: Self-pay

## 2013-12-12 DIAGNOSIS — Z1231 Encounter for screening mammogram for malignant neoplasm of breast: Secondary | ICD-10-CM

## 2013-12-16 DIAGNOSIS — M9953 Intervertebral disc stenosis of neural canal of lumbar region: Secondary | ICD-10-CM | POA: Diagnosis not present

## 2013-12-18 DIAGNOSIS — M9953 Intervertebral disc stenosis of neural canal of lumbar region: Secondary | ICD-10-CM | POA: Diagnosis not present

## 2013-12-19 DIAGNOSIS — E119 Type 2 diabetes mellitus without complications: Secondary | ICD-10-CM | POA: Diagnosis not present

## 2013-12-23 DIAGNOSIS — M9953 Intervertebral disc stenosis of neural canal of lumbar region: Secondary | ICD-10-CM | POA: Diagnosis not present

## 2013-12-25 DIAGNOSIS — M9953 Intervertebral disc stenosis of neural canal of lumbar region: Secondary | ICD-10-CM | POA: Diagnosis not present

## 2013-12-29 DIAGNOSIS — H2513 Age-related nuclear cataract, bilateral: Secondary | ICD-10-CM | POA: Diagnosis not present

## 2013-12-29 DIAGNOSIS — H5203 Hypermetropia, bilateral: Secondary | ICD-10-CM | POA: Diagnosis not present

## 2013-12-29 DIAGNOSIS — I10 Essential (primary) hypertension: Secondary | ICD-10-CM | POA: Diagnosis not present

## 2013-12-29 DIAGNOSIS — H35033 Hypertensive retinopathy, bilateral: Secondary | ICD-10-CM | POA: Diagnosis not present

## 2013-12-30 DIAGNOSIS — M9953 Intervertebral disc stenosis of neural canal of lumbar region: Secondary | ICD-10-CM | POA: Diagnosis not present

## 2013-12-31 DIAGNOSIS — R809 Proteinuria, unspecified: Secondary | ICD-10-CM | POA: Diagnosis not present

## 2013-12-31 DIAGNOSIS — E780 Pure hypercholesterolemia: Secondary | ICD-10-CM | POA: Diagnosis not present

## 2013-12-31 DIAGNOSIS — E119 Type 2 diabetes mellitus without complications: Secondary | ICD-10-CM | POA: Diagnosis not present

## 2013-12-31 DIAGNOSIS — I10 Essential (primary) hypertension: Secondary | ICD-10-CM | POA: Diagnosis not present

## 2014-01-01 DIAGNOSIS — M9953 Intervertebral disc stenosis of neural canal of lumbar region: Secondary | ICD-10-CM | POA: Diagnosis not present

## 2014-01-02 DIAGNOSIS — M4806 Spinal stenosis, lumbar region: Secondary | ICD-10-CM | POA: Diagnosis not present

## 2014-01-06 DIAGNOSIS — M9953 Intervertebral disc stenosis of neural canal of lumbar region: Secondary | ICD-10-CM | POA: Diagnosis not present

## 2014-01-08 DIAGNOSIS — M9953 Intervertebral disc stenosis of neural canal of lumbar region: Secondary | ICD-10-CM | POA: Diagnosis not present

## 2014-01-12 ENCOUNTER — Encounter (INDEPENDENT_AMBULATORY_CARE_PROVIDER_SITE_OTHER): Payer: Self-pay

## 2014-01-12 ENCOUNTER — Ambulatory Visit
Admission: RE | Admit: 2014-01-12 | Discharge: 2014-01-12 | Disposition: A | Discharge status: Home / Self Care | Payer: Medicare Other | Source: Ambulatory Visit

## 2014-01-12 DIAGNOSIS — Z1231 Encounter for screening mammogram for malignant neoplasm of breast: Secondary | ICD-10-CM | POA: Diagnosis not present

## 2014-01-13 DIAGNOSIS — M9953 Intervertebral disc stenosis of neural canal of lumbar region: Secondary | ICD-10-CM | POA: Diagnosis not present

## 2014-01-15 DIAGNOSIS — M9953 Intervertebral disc stenosis of neural canal of lumbar region: Secondary | ICD-10-CM | POA: Diagnosis not present

## 2014-01-20 DIAGNOSIS — M9953 Intervertebral disc stenosis of neural canal of lumbar region: Secondary | ICD-10-CM | POA: Diagnosis not present

## 2014-01-22 DIAGNOSIS — Z9221 Personal history of antineoplastic chemotherapy: Secondary | ICD-10-CM | POA: Diagnosis not present

## 2014-01-22 DIAGNOSIS — C569 Malignant neoplasm of unspecified ovary: Secondary | ICD-10-CM | POA: Diagnosis not present

## 2014-01-22 DIAGNOSIS — Z90710 Acquired absence of both cervix and uterus: Secondary | ICD-10-CM | POA: Diagnosis not present

## 2014-01-22 DIAGNOSIS — Z08 Encounter for follow-up examination after completed treatment for malignant neoplasm: Secondary | ICD-10-CM | POA: Diagnosis not present

## 2014-01-22 DIAGNOSIS — M549 Dorsalgia, unspecified: Secondary | ICD-10-CM | POA: Diagnosis not present

## 2014-01-22 DIAGNOSIS — Z86718 Personal history of other venous thrombosis and embolism: Secondary | ICD-10-CM | POA: Diagnosis not present

## 2014-01-22 DIAGNOSIS — E119 Type 2 diabetes mellitus without complications: Secondary | ICD-10-CM | POA: Diagnosis not present

## 2014-01-22 DIAGNOSIS — Z794 Long term (current) use of insulin: Secondary | ICD-10-CM | POA: Diagnosis not present

## 2014-01-22 DIAGNOSIS — E785 Hyperlipidemia, unspecified: Secondary | ICD-10-CM | POA: Diagnosis not present

## 2014-01-22 DIAGNOSIS — Z853 Personal history of malignant neoplasm of breast: Secondary | ICD-10-CM | POA: Diagnosis not present

## 2014-01-22 DIAGNOSIS — Z8543 Personal history of malignant neoplasm of ovary: Secondary | ICD-10-CM | POA: Diagnosis not present

## 2014-01-22 DIAGNOSIS — Z79899 Other long term (current) drug therapy: Secondary | ICD-10-CM | POA: Diagnosis not present

## 2014-01-22 DIAGNOSIS — Z90722 Acquired absence of ovaries, bilateral: Secondary | ICD-10-CM | POA: Diagnosis not present

## 2014-01-27 DIAGNOSIS — M9953 Intervertebral disc stenosis of neural canal of lumbar region: Secondary | ICD-10-CM | POA: Diagnosis not present

## 2014-02-03 DIAGNOSIS — M9953 Intervertebral disc stenosis of neural canal of lumbar region: Secondary | ICD-10-CM | POA: Diagnosis not present

## 2014-02-10 DIAGNOSIS — M9953 Intervertebral disc stenosis of neural canal of lumbar region: Secondary | ICD-10-CM | POA: Diagnosis not present

## 2014-03-10 DIAGNOSIS — I10 Essential (primary) hypertension: Secondary | ICD-10-CM | POA: Diagnosis not present

## 2014-03-10 DIAGNOSIS — H2512 Age-related nuclear cataract, left eye: Secondary | ICD-10-CM | POA: Diagnosis not present

## 2014-03-10 DIAGNOSIS — H18411 Arcus senilis, right eye: Secondary | ICD-10-CM | POA: Diagnosis not present

## 2014-03-10 DIAGNOSIS — H2511 Age-related nuclear cataract, right eye: Secondary | ICD-10-CM | POA: Diagnosis not present

## 2014-04-02 DIAGNOSIS — L309 Dermatitis, unspecified: Secondary | ICD-10-CM | POA: Diagnosis not present

## 2014-04-02 DIAGNOSIS — I10 Essential (primary) hypertension: Secondary | ICD-10-CM | POA: Diagnosis not present

## 2014-04-02 DIAGNOSIS — R809 Proteinuria, unspecified: Secondary | ICD-10-CM | POA: Diagnosis not present

## 2014-04-02 DIAGNOSIS — E780 Pure hypercholesterolemia: Secondary | ICD-10-CM | POA: Diagnosis not present

## 2014-04-02 DIAGNOSIS — E119 Type 2 diabetes mellitus without complications: Secondary | ICD-10-CM | POA: Diagnosis not present

## 2014-04-20 DIAGNOSIS — H25811 Combined forms of age-related cataract, right eye: Secondary | ICD-10-CM | POA: Diagnosis not present

## 2014-04-20 DIAGNOSIS — H2511 Age-related nuclear cataract, right eye: Secondary | ICD-10-CM | POA: Diagnosis not present

## 2014-04-21 DIAGNOSIS — H2512 Age-related nuclear cataract, left eye: Secondary | ICD-10-CM | POA: Diagnosis not present

## 2014-05-11 DIAGNOSIS — H2512 Age-related nuclear cataract, left eye: Secondary | ICD-10-CM | POA: Diagnosis not present

## 2014-05-11 DIAGNOSIS — H25812 Combined forms of age-related cataract, left eye: Secondary | ICD-10-CM | POA: Diagnosis not present

## 2014-05-21 DIAGNOSIS — Z8543 Personal history of malignant neoplasm of ovary: Secondary | ICD-10-CM | POA: Diagnosis not present

## 2014-05-21 DIAGNOSIS — Z853 Personal history of malignant neoplasm of breast: Secondary | ICD-10-CM | POA: Diagnosis not present

## 2014-05-21 DIAGNOSIS — Z08 Encounter for follow-up examination after completed treatment for malignant neoplasm: Secondary | ICD-10-CM | POA: Diagnosis not present

## 2014-05-21 DIAGNOSIS — C50912 Malignant neoplasm of unspecified site of left female breast: Secondary | ICD-10-CM | POA: Diagnosis not present

## 2014-06-25 DIAGNOSIS — E1165 Type 2 diabetes mellitus with hyperglycemia: Secondary | ICD-10-CM | POA: Diagnosis not present

## 2014-06-25 DIAGNOSIS — E119 Type 2 diabetes mellitus without complications: Secondary | ICD-10-CM | POA: Diagnosis not present

## 2014-06-25 DIAGNOSIS — R809 Proteinuria, unspecified: Secondary | ICD-10-CM | POA: Diagnosis not present

## 2014-07-02 DIAGNOSIS — E780 Pure hypercholesterolemia: Secondary | ICD-10-CM | POA: Diagnosis not present

## 2014-07-02 DIAGNOSIS — R809 Proteinuria, unspecified: Secondary | ICD-10-CM | POA: Diagnosis not present

## 2014-07-02 DIAGNOSIS — I10 Essential (primary) hypertension: Secondary | ICD-10-CM | POA: Diagnosis not present

## 2014-07-02 DIAGNOSIS — E119 Type 2 diabetes mellitus without complications: Secondary | ICD-10-CM | POA: Diagnosis not present

## 2014-07-02 DIAGNOSIS — E559 Vitamin D deficiency, unspecified: Secondary | ICD-10-CM | POA: Diagnosis not present

## 2014-07-07 DIAGNOSIS — M4806 Spinal stenosis, lumbar region: Secondary | ICD-10-CM | POA: Diagnosis not present

## 2014-07-30 DIAGNOSIS — E669 Obesity, unspecified: Secondary | ICD-10-CM | POA: Diagnosis not present

## 2014-07-30 DIAGNOSIS — Z8543 Personal history of malignant neoplasm of ovary: Secondary | ICD-10-CM | POA: Diagnosis not present

## 2014-07-30 DIAGNOSIS — Z6841 Body Mass Index (BMI) 40.0 and over, adult: Secondary | ICD-10-CM | POA: Diagnosis not present

## 2014-07-30 DIAGNOSIS — M549 Dorsalgia, unspecified: Secondary | ICD-10-CM | POA: Diagnosis not present

## 2014-07-30 DIAGNOSIS — E119 Type 2 diabetes mellitus without complications: Secondary | ICD-10-CM | POA: Diagnosis not present

## 2014-07-30 DIAGNOSIS — Z87891 Personal history of nicotine dependence: Secondary | ICD-10-CM | POA: Diagnosis not present

## 2014-07-30 DIAGNOSIS — C569 Malignant neoplasm of unspecified ovary: Secondary | ICD-10-CM | POA: Diagnosis not present

## 2014-08-20 DIAGNOSIS — E119 Type 2 diabetes mellitus without complications: Secondary | ICD-10-CM | POA: Diagnosis not present

## 2014-08-20 DIAGNOSIS — F509 Eating disorder, unspecified: Secondary | ICD-10-CM | POA: Diagnosis not present

## 2014-08-20 DIAGNOSIS — M4806 Spinal stenosis, lumbar region: Secondary | ICD-10-CM | POA: Diagnosis not present

## 2014-09-14 DIAGNOSIS — F329 Major depressive disorder, single episode, unspecified: Secondary | ICD-10-CM | POA: Diagnosis not present

## 2014-09-14 DIAGNOSIS — F419 Anxiety disorder, unspecified: Secondary | ICD-10-CM | POA: Diagnosis not present

## 2014-09-14 DIAGNOSIS — F54 Psychological and behavioral factors associated with disorders or diseases classified elsewhere: Secondary | ICD-10-CM | POA: Diagnosis not present

## 2014-10-01 DIAGNOSIS — F329 Major depressive disorder, single episode, unspecified: Secondary | ICD-10-CM | POA: Diagnosis not present

## 2014-10-01 DIAGNOSIS — E119 Type 2 diabetes mellitus without complications: Secondary | ICD-10-CM | POA: Diagnosis not present

## 2014-10-01 DIAGNOSIS — R809 Proteinuria, unspecified: Secondary | ICD-10-CM | POA: Diagnosis not present

## 2014-10-01 DIAGNOSIS — Z794 Long term (current) use of insulin: Secondary | ICD-10-CM | POA: Diagnosis not present

## 2014-10-01 DIAGNOSIS — I10 Essential (primary) hypertension: Secondary | ICD-10-CM | POA: Diagnosis not present

## 2014-10-01 DIAGNOSIS — E559 Vitamin D deficiency, unspecified: Secondary | ICD-10-CM | POA: Diagnosis not present

## 2014-10-01 DIAGNOSIS — Z853 Personal history of malignant neoplasm of breast: Secondary | ICD-10-CM | POA: Diagnosis not present

## 2014-10-01 DIAGNOSIS — Z6841 Body Mass Index (BMI) 40.0 and over, adult: Secondary | ICD-10-CM | POA: Diagnosis not present

## 2014-10-01 DIAGNOSIS — E780 Pure hypercholesterolemia: Secondary | ICD-10-CM | POA: Diagnosis not present

## 2014-10-20 DIAGNOSIS — M4806 Spinal stenosis, lumbar region: Secondary | ICD-10-CM | POA: Diagnosis not present

## 2014-12-09 ENCOUNTER — Other Ambulatory Visit: Payer: Self-pay

## 2014-12-09 DIAGNOSIS — Z1231 Encounter for screening mammogram for malignant neoplasm of breast: Secondary | ICD-10-CM

## 2015-01-04 DIAGNOSIS — Z23 Encounter for immunization: Secondary | ICD-10-CM | POA: Diagnosis not present

## 2015-01-04 DIAGNOSIS — E559 Vitamin D deficiency, unspecified: Secondary | ICD-10-CM | POA: Diagnosis not present

## 2015-01-04 DIAGNOSIS — I10 Essential (primary) hypertension: Secondary | ICD-10-CM | POA: Diagnosis not present

## 2015-01-04 DIAGNOSIS — R809 Proteinuria, unspecified: Secondary | ICD-10-CM | POA: Diagnosis not present

## 2015-01-04 DIAGNOSIS — Z853 Personal history of malignant neoplasm of breast: Secondary | ICD-10-CM | POA: Diagnosis not present

## 2015-01-04 DIAGNOSIS — E7800 Pure hypercholesterolemia, unspecified: Secondary | ICD-10-CM | POA: Diagnosis not present

## 2015-01-04 DIAGNOSIS — Z8543 Personal history of malignant neoplasm of ovary: Secondary | ICD-10-CM | POA: Diagnosis not present

## 2015-01-04 DIAGNOSIS — Z6841 Body Mass Index (BMI) 40.0 and over, adult: Secondary | ICD-10-CM | POA: Diagnosis not present

## 2015-01-04 DIAGNOSIS — Z794 Long term (current) use of insulin: Secondary | ICD-10-CM | POA: Diagnosis not present

## 2015-01-04 DIAGNOSIS — F339 Major depressive disorder, recurrent, unspecified: Secondary | ICD-10-CM | POA: Diagnosis not present

## 2015-01-04 DIAGNOSIS — E119 Type 2 diabetes mellitus without complications: Secondary | ICD-10-CM | POA: Diagnosis not present

## 2015-01-15 ENCOUNTER — Ambulatory Visit
Admission: RE | Admit: 2015-01-15 | Discharge: 2015-01-15 | Disposition: A | Discharge status: Home / Self Care | Payer: Medicare Other | Source: Ambulatory Visit

## 2015-01-15 DIAGNOSIS — Z1231 Encounter for screening mammogram for malignant neoplasm of breast: Secondary | ICD-10-CM | POA: Diagnosis not present

## 2015-01-21 DIAGNOSIS — Z8052 Family history of malignant neoplasm of bladder: Secondary | ICD-10-CM | POA: Diagnosis not present

## 2015-01-21 DIAGNOSIS — Z800 Family history of malignant neoplasm of digestive organs: Secondary | ICD-10-CM | POA: Diagnosis not present

## 2015-01-21 DIAGNOSIS — Z87891 Personal history of nicotine dependence: Secondary | ICD-10-CM | POA: Diagnosis not present

## 2015-01-21 DIAGNOSIS — Z08 Encounter for follow-up examination after completed treatment for malignant neoplasm: Secondary | ICD-10-CM | POA: Diagnosis not present

## 2015-01-21 DIAGNOSIS — Z8543 Personal history of malignant neoplasm of ovary: Secondary | ICD-10-CM | POA: Diagnosis not present

## 2015-01-21 DIAGNOSIS — N9089 Other specified noninflammatory disorders of vulva and perineum: Secondary | ICD-10-CM | POA: Diagnosis not present

## 2015-01-21 DIAGNOSIS — Z90722 Acquired absence of ovaries, bilateral: Secondary | ICD-10-CM | POA: Diagnosis not present

## 2015-01-21 DIAGNOSIS — Z90710 Acquired absence of both cervix and uterus: Secondary | ICD-10-CM | POA: Diagnosis not present

## 2015-01-21 DIAGNOSIS — Z806 Family history of leukemia: Secondary | ICD-10-CM | POA: Diagnosis not present

## 2015-01-21 DIAGNOSIS — Z6841 Body Mass Index (BMI) 40.0 and over, adult: Secondary | ICD-10-CM | POA: Diagnosis not present

## 2015-02-02 DIAGNOSIS — M4806 Spinal stenosis, lumbar region: Secondary | ICD-10-CM | POA: Diagnosis not present

## 2015-02-16 DIAGNOSIS — M1990 Unspecified osteoarthritis, unspecified site: Secondary | ICD-10-CM | POA: Diagnosis not present

## 2015-02-16 DIAGNOSIS — E119 Type 2 diabetes mellitus without complications: Secondary | ICD-10-CM | POA: Diagnosis not present

## 2015-02-16 DIAGNOSIS — Z7982 Long term (current) use of aspirin: Secondary | ICD-10-CM | POA: Diagnosis not present

## 2015-02-16 DIAGNOSIS — F329 Major depressive disorder, single episode, unspecified: Secondary | ICD-10-CM | POA: Diagnosis not present

## 2015-02-16 DIAGNOSIS — L859 Epidermal thickening, unspecified: Secondary | ICD-10-CM | POA: Diagnosis not present

## 2015-02-16 DIAGNOSIS — I10 Essential (primary) hypertension: Secondary | ICD-10-CM | POA: Diagnosis not present

## 2015-02-16 DIAGNOSIS — Z7984 Long term (current) use of oral hypoglycemic drugs: Secondary | ICD-10-CM | POA: Diagnosis not present

## 2015-02-16 DIAGNOSIS — N909 Noninflammatory disorder of vulva and perineum, unspecified: Secondary | ICD-10-CM | POA: Diagnosis not present

## 2015-02-16 DIAGNOSIS — E785 Hyperlipidemia, unspecified: Secondary | ICD-10-CM | POA: Diagnosis not present

## 2015-02-16 DIAGNOSIS — Z79899 Other long term (current) drug therapy: Secondary | ICD-10-CM | POA: Diagnosis not present

## 2015-02-16 DIAGNOSIS — F419 Anxiety disorder, unspecified: Secondary | ICD-10-CM | POA: Diagnosis not present

## 2015-04-05 DIAGNOSIS — R809 Proteinuria, unspecified: Secondary | ICD-10-CM | POA: Diagnosis not present

## 2015-04-05 DIAGNOSIS — E7800 Pure hypercholesterolemia, unspecified: Secondary | ICD-10-CM | POA: Diagnosis not present

## 2015-04-05 DIAGNOSIS — M4800 Spinal stenosis, site unspecified: Secondary | ICD-10-CM | POA: Diagnosis not present

## 2015-04-05 DIAGNOSIS — I10 Essential (primary) hypertension: Secondary | ICD-10-CM | POA: Diagnosis not present

## 2015-04-05 DIAGNOSIS — Z7984 Long term (current) use of oral hypoglycemic drugs: Secondary | ICD-10-CM | POA: Diagnosis not present

## 2015-04-05 DIAGNOSIS — F339 Major depressive disorder, recurrent, unspecified: Secondary | ICD-10-CM | POA: Diagnosis not present

## 2015-04-05 DIAGNOSIS — E119 Type 2 diabetes mellitus without complications: Secondary | ICD-10-CM | POA: Diagnosis not present

## 2015-04-05 DIAGNOSIS — Z1389 Encounter for screening for other disorder: Secondary | ICD-10-CM | POA: Diagnosis not present

## 2015-04-05 DIAGNOSIS — E559 Vitamin D deficiency, unspecified: Secondary | ICD-10-CM | POA: Diagnosis not present

## 2015-05-13 DIAGNOSIS — R609 Edema, unspecified: Secondary | ICD-10-CM | POA: Diagnosis not present

## 2015-05-13 DIAGNOSIS — L308 Other specified dermatitis: Secondary | ICD-10-CM | POA: Diagnosis not present

## 2015-05-17 DIAGNOSIS — R609 Edema, unspecified: Secondary | ICD-10-CM | POA: Diagnosis not present

## 2015-05-17 DIAGNOSIS — M7989 Other specified soft tissue disorders: Secondary | ICD-10-CM | POA: Diagnosis not present

## 2015-05-17 DIAGNOSIS — M79604 Pain in right leg: Secondary | ICD-10-CM | POA: Diagnosis not present

## 2015-05-17 DIAGNOSIS — M79605 Pain in left leg: Secondary | ICD-10-CM | POA: Diagnosis not present

## 2015-05-17 DIAGNOSIS — Z86718 Personal history of other venous thrombosis and embolism: Secondary | ICD-10-CM | POA: Diagnosis not present

## 2015-05-25 DIAGNOSIS — Z90722 Acquired absence of ovaries, bilateral: Secondary | ICD-10-CM | POA: Diagnosis not present

## 2015-05-25 DIAGNOSIS — Z853 Personal history of malignant neoplasm of breast: Secondary | ICD-10-CM | POA: Diagnosis not present

## 2015-05-25 DIAGNOSIS — Z8542 Personal history of malignant neoplasm of other parts of uterus: Secondary | ICD-10-CM | POA: Diagnosis not present

## 2015-05-25 DIAGNOSIS — C569 Malignant neoplasm of unspecified ovary: Secondary | ICD-10-CM | POA: Diagnosis not present

## 2015-05-25 DIAGNOSIS — C50212 Malignant neoplasm of upper-inner quadrant of left female breast: Secondary | ICD-10-CM | POA: Diagnosis not present

## 2015-05-25 DIAGNOSIS — Z90710 Acquired absence of both cervix and uterus: Secondary | ICD-10-CM | POA: Diagnosis not present

## 2015-05-25 DIAGNOSIS — Z9079 Acquired absence of other genital organ(s): Secondary | ICD-10-CM | POA: Diagnosis not present

## 2015-05-25 DIAGNOSIS — Z9889 Other specified postprocedural states: Secondary | ICD-10-CM | POA: Diagnosis not present

## 2015-05-25 DIAGNOSIS — Z08 Encounter for follow-up examination after completed treatment for malignant neoplasm: Secondary | ICD-10-CM | POA: Diagnosis not present

## 2015-07-22 DIAGNOSIS — Z8543 Personal history of malignant neoplasm of ovary: Secondary | ICD-10-CM | POA: Diagnosis not present

## 2015-07-22 DIAGNOSIS — Z9079 Acquired absence of other genital organ(s): Secondary | ICD-10-CM | POA: Diagnosis not present

## 2015-07-22 DIAGNOSIS — E669 Obesity, unspecified: Secondary | ICD-10-CM | POA: Diagnosis not present

## 2015-07-22 DIAGNOSIS — Z08 Encounter for follow-up examination after completed treatment for malignant neoplasm: Secondary | ICD-10-CM | POA: Diagnosis not present

## 2015-07-22 DIAGNOSIS — Z90710 Acquired absence of both cervix and uterus: Secondary | ICD-10-CM | POA: Diagnosis not present

## 2015-07-22 DIAGNOSIS — Z6841 Body Mass Index (BMI) 40.0 and over, adult: Secondary | ICD-10-CM | POA: Diagnosis not present

## 2015-07-22 DIAGNOSIS — Z87891 Personal history of nicotine dependence: Secondary | ICD-10-CM | POA: Diagnosis not present

## 2015-07-22 DIAGNOSIS — Z90722 Acquired absence of ovaries, bilateral: Secondary | ICD-10-CM | POA: Diagnosis not present

## 2015-07-22 DIAGNOSIS — L03119 Cellulitis of unspecified part of limb: Secondary | ICD-10-CM | POA: Diagnosis not present

## 2015-07-22 DIAGNOSIS — Z8052 Family history of malignant neoplasm of bladder: Secondary | ICD-10-CM | POA: Diagnosis not present

## 2015-07-22 DIAGNOSIS — C569 Malignant neoplasm of unspecified ovary: Secondary | ICD-10-CM | POA: Diagnosis not present

## 2015-07-26 DIAGNOSIS — I872 Venous insufficiency (chronic) (peripheral): Secondary | ICD-10-CM | POA: Diagnosis not present

## 2015-08-19 DIAGNOSIS — L821 Other seborrheic keratosis: Secondary | ICD-10-CM | POA: Diagnosis not present

## 2015-08-19 DIAGNOSIS — I872 Venous insufficiency (chronic) (peripheral): Secondary | ICD-10-CM | POA: Diagnosis not present

## 2015-10-05 DIAGNOSIS — R809 Proteinuria, unspecified: Secondary | ICD-10-CM | POA: Diagnosis not present

## 2015-10-05 DIAGNOSIS — I10 Essential (primary) hypertension: Secondary | ICD-10-CM | POA: Diagnosis not present

## 2015-10-05 DIAGNOSIS — E7800 Pure hypercholesterolemia, unspecified: Secondary | ICD-10-CM | POA: Diagnosis not present

## 2015-10-05 DIAGNOSIS — E559 Vitamin D deficiency, unspecified: Secondary | ICD-10-CM | POA: Diagnosis not present

## 2015-10-05 DIAGNOSIS — E119 Type 2 diabetes mellitus without complications: Secondary | ICD-10-CM | POA: Diagnosis not present

## 2015-10-05 DIAGNOSIS — Z794 Long term (current) use of insulin: Secondary | ICD-10-CM | POA: Diagnosis not present

## 2015-12-15 ENCOUNTER — Other Ambulatory Visit: Payer: Self-pay | Admitting: Family Medicine

## 2015-12-15 DIAGNOSIS — Z1231 Encounter for screening mammogram for malignant neoplasm of breast: Secondary | ICD-10-CM

## 2015-12-16 DIAGNOSIS — Z23 Encounter for immunization: Secondary | ICD-10-CM | POA: Diagnosis not present

## 2015-12-29 DIAGNOSIS — M545 Low back pain: Secondary | ICD-10-CM | POA: Diagnosis not present

## 2015-12-29 DIAGNOSIS — M1711 Unilateral primary osteoarthritis, right knee: Secondary | ICD-10-CM | POA: Diagnosis not present

## 2016-01-17 ENCOUNTER — Ambulatory Visit
Admission: RE | Admit: 2016-01-17 | Discharge: 2016-01-17 | Disposition: A | Discharge status: Home / Self Care | Payer: Medicare Other | Source: Ambulatory Visit | Attending: Family Medicine | Admitting: Family Medicine

## 2016-01-17 DIAGNOSIS — Z1231 Encounter for screening mammogram for malignant neoplasm of breast: Secondary | ICD-10-CM

## 2016-01-18 DIAGNOSIS — M1611 Unilateral primary osteoarthritis, right hip: Secondary | ICD-10-CM | POA: Diagnosis not present

## 2016-01-26 DIAGNOSIS — K640 First degree hemorrhoids: Secondary | ICD-10-CM | POA: Diagnosis not present

## 2016-01-26 DIAGNOSIS — K635 Polyp of colon: Secondary | ICD-10-CM | POA: Diagnosis not present

## 2016-01-26 DIAGNOSIS — D175 Benign lipomatous neoplasm of intra-abdominal organs: Secondary | ICD-10-CM | POA: Diagnosis not present

## 2016-01-26 DIAGNOSIS — K5730 Diverticulosis of large intestine without perforation or abscess without bleeding: Secondary | ICD-10-CM | POA: Diagnosis not present

## 2016-01-26 DIAGNOSIS — Z86010 Personal history of colonic polyps: Secondary | ICD-10-CM | POA: Diagnosis not present

## 2016-01-31 DIAGNOSIS — M1611 Unilateral primary osteoarthritis, right hip: Secondary | ICD-10-CM | POA: Diagnosis not present

## 2016-02-01 DIAGNOSIS — K635 Polyp of colon: Secondary | ICD-10-CM | POA: Diagnosis not present

## 2016-02-03 DIAGNOSIS — Z90722 Acquired absence of ovaries, bilateral: Secondary | ICD-10-CM | POA: Diagnosis not present

## 2016-02-03 DIAGNOSIS — Z87891 Personal history of nicotine dependence: Secondary | ICD-10-CM | POA: Diagnosis not present

## 2016-02-03 DIAGNOSIS — Z08 Encounter for follow-up examination after completed treatment for malignant neoplasm: Secondary | ICD-10-CM | POA: Diagnosis not present

## 2016-02-03 DIAGNOSIS — Z9079 Acquired absence of other genital organ(s): Secondary | ICD-10-CM | POA: Diagnosis not present

## 2016-02-03 DIAGNOSIS — Z90710 Acquired absence of both cervix and uterus: Secondary | ICD-10-CM | POA: Diagnosis not present

## 2016-02-03 DIAGNOSIS — Z853 Personal history of malignant neoplasm of breast: Secondary | ICD-10-CM | POA: Diagnosis not present

## 2016-02-03 DIAGNOSIS — Z9889 Other specified postprocedural states: Secondary | ICD-10-CM | POA: Diagnosis not present

## 2016-02-03 DIAGNOSIS — Z8543 Personal history of malignant neoplasm of ovary: Secondary | ICD-10-CM | POA: Diagnosis not present

## 2016-02-03 DIAGNOSIS — Z6841 Body Mass Index (BMI) 40.0 and over, adult: Secondary | ICD-10-CM | POA: Diagnosis not present

## 2016-02-03 DIAGNOSIS — E669 Obesity, unspecified: Secondary | ICD-10-CM | POA: Diagnosis not present

## 2016-02-03 DIAGNOSIS — M549 Dorsalgia, unspecified: Secondary | ICD-10-CM | POA: Diagnosis not present

## 2016-02-03 DIAGNOSIS — M25569 Pain in unspecified knee: Secondary | ICD-10-CM | POA: Diagnosis not present

## 2016-02-03 DIAGNOSIS — E119 Type 2 diabetes mellitus without complications: Secondary | ICD-10-CM | POA: Diagnosis not present

## 2016-02-03 DIAGNOSIS — C569 Malignant neoplasm of unspecified ovary: Secondary | ICD-10-CM | POA: Diagnosis not present

## 2016-02-03 DIAGNOSIS — E785 Hyperlipidemia, unspecified: Secondary | ICD-10-CM | POA: Diagnosis not present

## 2016-02-07 DIAGNOSIS — M1711 Unilateral primary osteoarthritis, right knee: Secondary | ICD-10-CM | POA: Diagnosis not present

## 2016-02-08 DIAGNOSIS — H52223 Regular astigmatism, bilateral: Secondary | ICD-10-CM | POA: Diagnosis not present

## 2016-02-08 DIAGNOSIS — Z961 Presence of intraocular lens: Secondary | ICD-10-CM | POA: Diagnosis not present

## 2016-02-08 DIAGNOSIS — E119 Type 2 diabetes mellitus without complications: Secondary | ICD-10-CM | POA: Diagnosis not present

## 2016-02-08 DIAGNOSIS — H5203 Hypermetropia, bilateral: Secondary | ICD-10-CM | POA: Diagnosis not present

## 2016-02-14 DIAGNOSIS — M1711 Unilateral primary osteoarthritis, right knee: Secondary | ICD-10-CM | POA: Diagnosis not present

## 2016-02-21 DIAGNOSIS — M1711 Unilateral primary osteoarthritis, right knee: Secondary | ICD-10-CM | POA: Diagnosis not present

## 2016-02-29 DIAGNOSIS — M1711 Unilateral primary osteoarthritis, right knee: Secondary | ICD-10-CM | POA: Diagnosis not present

## 2016-03-07 DIAGNOSIS — H02839 Dermatochalasis of unspecified eye, unspecified eyelid: Secondary | ICD-10-CM | POA: Diagnosis not present

## 2016-03-07 DIAGNOSIS — I10 Essential (primary) hypertension: Secondary | ICD-10-CM | POA: Diagnosis not present

## 2016-03-07 DIAGNOSIS — E119 Type 2 diabetes mellitus without complications: Secondary | ICD-10-CM | POA: Diagnosis not present

## 2016-03-07 DIAGNOSIS — H26491 Other secondary cataract, right eye: Secondary | ICD-10-CM | POA: Diagnosis not present

## 2016-03-07 DIAGNOSIS — Z961 Presence of intraocular lens: Secondary | ICD-10-CM | POA: Diagnosis not present

## 2016-03-08 DIAGNOSIS — M1711 Unilateral primary osteoarthritis, right knee: Secondary | ICD-10-CM | POA: Diagnosis not present

## 2016-04-11 DIAGNOSIS — I350 Nonrheumatic aortic (valve) stenosis: Secondary | ICD-10-CM | POA: Diagnosis not present

## 2016-04-11 DIAGNOSIS — I519 Heart disease, unspecified: Secondary | ICD-10-CM | POA: Diagnosis not present

## 2016-04-11 DIAGNOSIS — E559 Vitamin D deficiency, unspecified: Secondary | ICD-10-CM | POA: Diagnosis not present

## 2016-04-11 DIAGNOSIS — E7800 Pure hypercholesterolemia, unspecified: Secondary | ICD-10-CM | POA: Diagnosis not present

## 2016-04-11 DIAGNOSIS — E119 Type 2 diabetes mellitus without complications: Secondary | ICD-10-CM | POA: Diagnosis not present

## 2016-04-11 DIAGNOSIS — M4800 Spinal stenosis, site unspecified: Secondary | ICD-10-CM | POA: Diagnosis not present

## 2016-04-11 DIAGNOSIS — M2550 Pain in unspecified joint: Secondary | ICD-10-CM | POA: Diagnosis not present

## 2016-04-11 DIAGNOSIS — Z86718 Personal history of other venous thrombosis and embolism: Secondary | ICD-10-CM | POA: Diagnosis not present

## 2016-04-11 DIAGNOSIS — I10 Essential (primary) hypertension: Secondary | ICD-10-CM | POA: Diagnosis not present

## 2016-04-11 DIAGNOSIS — R0609 Other forms of dyspnea: Secondary | ICD-10-CM | POA: Diagnosis not present

## 2016-04-11 DIAGNOSIS — N183 Chronic kidney disease, stage 3 (moderate): Secondary | ICD-10-CM | POA: Diagnosis not present

## 2016-04-11 DIAGNOSIS — R809 Proteinuria, unspecified: Secondary | ICD-10-CM | POA: Diagnosis not present

## 2016-04-12 ENCOUNTER — Encounter (HOSPITAL_COMMUNITY): Payer: Self-pay | Admitting: Emergency Medicine

## 2016-04-12 ENCOUNTER — Emergency Department (HOSPITAL_COMMUNITY): Payer: Medicare Other

## 2016-04-12 ENCOUNTER — Telehealth: Payer: Self-pay

## 2016-04-12 ENCOUNTER — Emergency Department (HOSPITAL_COMMUNITY)
Admission: EM | Admit: 2016-04-12 | Discharge: 2016-04-12 | Disposition: A | Discharge status: Home / Self Care | Payer: Medicare Other | Attending: Emergency Medicine | Admitting: Emergency Medicine

## 2016-04-12 DIAGNOSIS — Z79899 Other long term (current) drug therapy: Secondary | ICD-10-CM | POA: Insufficient documentation

## 2016-04-12 DIAGNOSIS — Z87891 Personal history of nicotine dependence: Secondary | ICD-10-CM | POA: Insufficient documentation

## 2016-04-12 DIAGNOSIS — I10 Essential (primary) hypertension: Secondary | ICD-10-CM | POA: Diagnosis not present

## 2016-04-12 DIAGNOSIS — R600 Localized edema: Secondary | ICD-10-CM | POA: Insufficient documentation

## 2016-04-12 DIAGNOSIS — Z7982 Long term (current) use of aspirin: Secondary | ICD-10-CM | POA: Diagnosis not present

## 2016-04-12 DIAGNOSIS — E119 Type 2 diabetes mellitus without complications: Secondary | ICD-10-CM | POA: Diagnosis not present

## 2016-04-12 DIAGNOSIS — Z8543 Personal history of malignant neoplasm of ovary: Secondary | ICD-10-CM | POA: Insufficient documentation

## 2016-04-12 DIAGNOSIS — Z853 Personal history of malignant neoplasm of breast: Secondary | ICD-10-CM | POA: Diagnosis not present

## 2016-04-12 DIAGNOSIS — R0602 Shortness of breath: Secondary | ICD-10-CM | POA: Insufficient documentation

## 2016-04-12 DIAGNOSIS — Z794 Long term (current) use of insulin: Secondary | ICD-10-CM | POA: Diagnosis not present

## 2016-04-12 DIAGNOSIS — R609 Edema, unspecified: Secondary | ICD-10-CM

## 2016-04-12 HISTORY — DX: Malignant neoplasm of unspecified ovary: C56.9

## 2016-04-12 HISTORY — DX: Spinal stenosis, site unspecified: M48.00

## 2016-04-12 LAB — COMPREHENSIVE METABOLIC PANEL
ALT: 23 U/L (ref 14–54)
AST: 26 U/L (ref 15–41)
Albumin: 4.1 g/dL (ref 3.5–5.0)
Alkaline Phosphatase: 53 U/L (ref 38–126)
Anion gap: 11 (ref 5–15)
BILIRUBIN TOTAL: 0.8 mg/dL (ref 0.3–1.2)
BUN: 20 mg/dL (ref 6–20)
CO2: 23 mmol/L (ref 22–32)
CREATININE: 1.14 mg/dL — AB (ref 0.44–1.00)
Calcium: 9.6 mg/dL (ref 8.9–10.3)
Chloride: 106 mmol/L (ref 101–111)
GFR, EST AFRICAN AMERICAN: 52 mL/min — AB (ref >60)
GFR, EST NON AFRICAN AMERICAN: 45 mL/min — AB (ref >60)
Glucose, Bld: 149 mg/dL — ABNORMAL HIGH (ref 65–99)
POTASSIUM: 4.1 mmol/L (ref 3.5–5.1)
Sodium: 140 mmol/L (ref 135–145)
TOTAL PROTEIN: 7.1 g/dL (ref 6.5–8.1)

## 2016-04-12 LAB — BRAIN NATRIURETIC PEPTIDE: B NATRIURETIC PEPTIDE 5: 225.2 pg/mL — AB (ref 0.0–100.0)

## 2016-04-12 LAB — CBC WITH DIFFERENTIAL/PLATELET
BASOS ABS: 0 10*3/uL (ref 0.0–0.1)
Basophils Relative: 1 %
Eosinophils Absolute: 0.4 10*3/uL (ref 0.0–0.7)
Eosinophils Relative: 6 %
HEMATOCRIT: 36 % (ref 36.0–46.0)
Hemoglobin: 11.7 g/dL — ABNORMAL LOW (ref 12.0–15.0)
LYMPHS ABS: 2.2 10*3/uL (ref 0.7–4.0)
LYMPHS PCT: 33 %
MCH: 30.5 pg (ref 26.0–34.0)
MCHC: 32.5 g/dL (ref 30.0–36.0)
MCV: 93.8 fL (ref 78.0–100.0)
Monocytes Absolute: 0.7 10*3/uL (ref 0.1–1.0)
Monocytes Relative: 10 %
NEUTROS ABS: 3.4 10*3/uL (ref 1.7–7.7)
Neutrophils Relative %: 50 %
Platelets: 189 10*3/uL (ref 150–400)
RBC: 3.84 MIL/uL — ABNORMAL LOW (ref 3.87–5.11)
RDW: 14.6 % (ref 11.5–15.5)
WBC: 6.6 10*3/uL (ref 4.0–10.5)

## 2016-04-12 LAB — I-STAT TROPONIN, ED: Troponin i, poc: 0.01 ng/mL (ref 0.00–0.08)

## 2016-04-12 MED ORDER — SODIUM CHLORIDE 0.9 % IJ SOLN
INTRAMUSCULAR | Status: AC
  Filled 2016-04-12: qty 50

## 2016-04-12 MED ORDER — IOPAMIDOL (ISOVUE-370) INJECTION 76%
100.0000 mL | Freq: Once | INTRAVENOUS | Status: AC | PRN
  Administered 2016-04-12: 80 mL via INTRAVENOUS

## 2016-04-12 MED ORDER — IOPAMIDOL (ISOVUE-370) INJECTION 76%
INTRAVENOUS | Status: AC
  Filled 2016-04-12: qty 100

## 2016-04-12 NOTE — ED Provider Notes (Signed)
[facility] DEPT Provider Note   CSN: [id number] Arrival date & time: 04/12/16  0715     History   Chief Complaint Chief Complaint  Patient presents with  . Evaluation for PE    HPI [name] is a 81 y.o. female.  The history is provided by the patient.  Shortness of Breath  This is a new problem. The average episode lasts 2 months. The problem occurs frequently.The problem has been gradually worsening. Associated symptoms include leg swelling. Pertinent negatives include no fever. She has tried nothing for the symptoms. Associated medical issues include DVT.   BLE edema for 1 yr with 20 lbs weight gain in the last 3 months.  Seen by PCP and had elevated DIMER. Sent here for CT.  Past Medical History:  Diagnosis Date  . Arthritis   . Arthritis   . Cancer [facility])    CANCER  . Diabetes mellitus   . Hypertension   . Ovarian cancer ([place])   . Spinal stenosis     Patient Active Problem List   Diagnosis Date Noted  . Breast cancer, 2006 12/27/2010    Past Surgical History:  Procedure Laterality Date  . ABDOMINAL HYSTERECTOMY    . BREAST SURGERY  03-2004   lumpectomy     OB History    No data available       Home Medications    Prior to Admission medications   Medication Sig Start Date End Date Taking? Authorizing Provider  aspirin 81 MG tablet Take 81 mg by mouth daily.     Yes Historical Provider, MD  atorvastatin (LIPITOR) 20 MG tablet Take 20 mg by mouth every evening.     Yes Historical Provider, MD  Calcium Citrate-Vitamin D (CALCIUM CITRATE + PO) Take by mouth 2 (two) times daily.    Yes Historical Provider, MD  Cholecalciferol (VITAMIN D) 2000 UNITS tablet Take 3,000 Units by mouth daily. 2,000 IU in am, and 1,000 IU in the pm.    Yes Historical Provider, MD  clobetasol cream (TEMOVATE) [id number] % Apply 1 application topically 2 (two) times daily as needed for rash. irritations 03/20/16  Yes Historical Provider, MD  fish oil-omega-3 fatty acids 1000 MG  capsule Take 1 g by mouth daily.    Yes Historical Provider, MD  FLUoxetine (PROZAC) 40 MG capsule Take 40 mg by mouth every morning.     Yes Historical Provider, MD  furosemide (LASIX) 40 MG tablet Take 40 mg by mouth daily.   Yes Historical Provider, MD  insulin detemir (LEVEMIR) 100 UNIT/ML injection Inject 70 Units into the skin 2 (two) times daily. Once in a.m. - once in p.m.    Yes Historical Provider, MD  Magnesium 400 MG CAPS Take 400 mg by mouth every evening.    Yes Historical Provider, MD  meloxicam (MOBIC) 7.5 MG tablet Take 7.5 mg by mouth daily as needed for pain. 04/04/16  Yes Historical Provider, MD  metFORMIN (GLUCOPHAGE) 1000 MG tablet Take 1,000 mg by mouth 2 (two) times daily with a meal.    Yes Historical Provider, MD  metoprolol (LOPRESSOR) 50 MG tablet Take 25 mg by mouth 2 (two) times daily.    Yes Historical Provider, MD  Multiple Vitamin (MULTIVITAMIN) tablet Take 1 tablet by mouth daily.     Yes Historical Provider, MD  olmesartan (BENICAR) 20 MG tablet Take 10 mg by mouth every evening.   Yes Historical Provider, MD    Family History Family History  Problem Relation  Age of Onset  . Cancer Father     BLADDER  . Cancer Brother   . Cancer Sister     Social History Social History  Substance Use Topics  . Smoking status: Former Research scientist (life sciences)  . Smokeless tobacco: Never Used  . Alcohol use No     Allergies   Other   Review of Systems Review of Systems  Constitutional: Negative for fever.  Respiratory: Positive for shortness of breath.   Cardiovascular: Positive for leg swelling.  Ten systems are reviewed and are negative for acute change except as noted in the HPI    Physical Exam Updated Vital Signs BP 128/62 (BP Location: Left Arm)   Pulse 66   Temp 97.6 F (36.4 C) (Oral)   Resp 16   Wt 300 lb (136.1 kg)   SpO2 93%   BMI 46.29 kg/m   Physical Exam  Constitutional: She is oriented to person, place, and time. She appears well-developed and  well-nourished. No distress.  HENT:  Head: Normocephalic and atraumatic.  Nose: Nose normal.  Eyes: Conjunctivae and EOM are normal. Pupils are equal, round, and reactive to light. Right eye exhibits no discharge. Left eye exhibits no discharge. No scleral icterus.  Neck: Normal range of motion. Neck supple.  Cardiovascular: Normal rate and regular rhythm.  Exam reveals no gallop and no friction rub.   Murmur heard.  Systolic (over right sternal border) murmur is present with a grade of 4/6  Pulmonary/Chest: Effort normal and breath sounds normal. No stridor. No respiratory distress. She has no rales.  Abdominal: Soft. She exhibits no distension. There is no tenderness.  Musculoskeletal: She exhibits no edema or tenderness.  BLE edema  Neurological: She is alert and oriented to person, place, and time.  Skin: Skin is warm and dry. No rash noted. She is not diaphoretic. No erythema.  Psychiatric: She has a normal mood and affect.  Vitals reviewed.    ED Treatments / Results  Labs (all labs ordered are listed, but only abnormal results are displayed) Labs Reviewed  CBC WITH DIFFERENTIAL/PLATELET - Abnormal; Notable for the following:       Result Value   RBC 3.84 (*)    Hemoglobin 11.7 (*)    All other components within normal limits  COMPREHENSIVE METABOLIC PANEL - Abnormal; Notable for the following:    Glucose, Bld 149 (*)    Creatinine, Ser 1.14 (*)    GFR calc non Af Amer 45 (*)    GFR calc Af Amer 52 (*)    All other components within normal limits  BRAIN NATRIURETIC PEPTIDE - Abnormal; Notable for the following:    B Natriuretic Peptide 225.2 (*)    All other components within normal limits  I-STAT TROPOININ, ED    EKG  EKG Interpretation  Date/Time:  Wednesday April 12 2016 08:50:58 EST Ventricular Rate:  63 PR Interval:    QRS Duration: 139 QT Interval:  459 QTC Calculation: 470 R Axis:   80 Text Interpretation:  Sinus rhythm Right bundle branch block ,  new from prior Otherwise no significant change Confirmed by [facility] MD, Eleanora Guinyard ([id number]) on 04/12/2016 11:51:43 AM       Radiology Dg Chest 2 View  Result Date: 04/12/2016 CLINICAL DATA:  Shortness of breath. EXAM: CHEST  2 VIEW COMPARISON:  02/12/2008 FINDINGS: Heart is at the upper limits of normal in size. Pulmonary vascularity is at the upper limits of normal. No infiltrates or effusions. No acute bone abnormality. Arthritic changes  of both shoulders. IMPRESSION: Borderline cardiomegaly. Electronically Signed   By: Lorriane Shire M.D.   On: 04/12/2016 07:59   Ct Angio Chest Pe W Or Wo Contrast  Result Date: 04/12/2016 CLINICAL DATA:  Shortness of Breath EXAM: CT ANGIOGRAPHY CHEST WITH CONTRAST TECHNIQUE: Multidetector CT imaging of the chest was performed using the standard protocol during bolus administration of intravenous contrast. Multiplanar CT image reconstructions and MIPs were obtained to evaluate the vascular anatomy. CONTRAST:  80 mL Isovue 370. COMPARISON:  04/12/2016, 04/14/2004 FINDINGS: Cardiovascular: Thoracic aorta demonstrates atherosclerotic calcifications without aneurysmal dilatation or dissection. Aortic valve calcifications are seen. Coronary calcifications are noted as are mitral valve calcifications. Pulmonary artery is well visualize without filling defect to suggest pulmonary embolism. Mild cardiac enlargement is noted. No pericardial effusion is seen. Mediastinum/Nodes: Thoracic inlet again demonstrates calcified left thyroid nodules. These are stable from prior exam. The largest of these measures approximately 1 cm. 18 mm short axis lymph node is noted adjacent to the carina best seen on image number are 97 of series 7. Scattered smaller mediastinal lymph nodes are seen. No significant hilar adenopathy is noted. Lungs/Pleura: The lungs are well aerated bilaterally. No focal infiltrate or sizable effusion is seen. No parenchymal nodules are noted. Upper Abdomen: Left renal cystic  changes are noted. A small hiatal hernia is noted as well. Musculoskeletal: Degenerative changes of the thoracic spine are noted. Review of the MIP images confirms the above findings. IMPRESSION: No evidence of pulmonary emboli. Prominent lymph node in the right paratracheal/precarinal region as described. This is of uncertain significance as no other significant adenopathy is seen. Short-term follow-up examination may be helpful to assess for stability or resolution. Electronically Signed   By: Inez Catalina M.D.   On: 04/12/2016 11:15    Procedures Procedures (including critical care time)  Medications Ordered in ED Medications  iopamidol (ISOVUE-370) 76 % injection 100 mL (80 mLs Intravenous Contrast Given 04/12/16 1042)     Initial Impression / Assessment and Plan / ED Course  I have reviewed the triage vital signs and the nursing notes.  Pertinent labs & imaging results that were available during my care of the patient were reviewed by me and considered in my medical decision making (see chart for details).     Workup without evidence of pulmonary embolism, pulmonary edema, pneumonia. Patient with volume overload already on Lasix. Instructed to follow-up with primary care provider for continued management and further workup during an echo.  The patient is safe for discharge with strict return precautions.   Final Clinical Impressions(s) / ED Diagnoses   Final diagnoses:  SOB (shortness of breath)  Peripheral edema   Disposition: Discharge  Condition: Good  I have discussed the results, Dx and Tx plan with the patient who expressed understanding and agree(s) with the plan. Discharge instructions discussed at great length. The patient was given strict return precautions who verbalized understanding of the instructions. No further questions at time of discharge.    Discharge Medication List as of 04/12/2016  1:07 PM      Follow Up: Leighton Ruff, MD [place] [id number] [phone]  Schedule an appointment as soon as possible for a visit in 2 days for further work up including ECHOCARDIOGRAM to assess heart function      Fatima Blank, MD 04/12/16 1733

## 2016-04-12 NOTE — ED Triage Notes (Signed)
Pt sent by PCP after having an abnormal lab result; pt was seen by PCP yesterday and had episode of SOB and diaphoresis; hx of blood clots 3 years ago; pt unsure what abnormal lab value was; pt states she has more difficulty walking as of late and has gained 20 pounds since Christmas; NAD noted at present

## 2016-04-12 NOTE — Telephone Encounter (Signed)
SENT NOTE TO SCHEDULING 

## 2016-04-12 NOTE — ED Notes (Signed)
Pt has completed CT, waiting on results

## 2016-04-19 ENCOUNTER — Telehealth: Payer: Self-pay | Admitting: Cardiology

## 2016-04-19 NOTE — Telephone Encounter (Signed)
Received records from Christine for appointment on 04/20/16 with Kerin Ransom, [place].  Records put with Luke's schedule for 04/20/16. lp

## 2016-04-20 ENCOUNTER — Ambulatory Visit (INDEPENDENT_AMBULATORY_CARE_PROVIDER_SITE_OTHER): Payer: Medicare Other | Admitting: Cardiology

## 2016-04-20 ENCOUNTER — Encounter (HOSPITAL_COMMUNITY): Payer: Self-pay

## 2016-04-20 ENCOUNTER — Emergency Department (HOSPITAL_COMMUNITY): Payer: Medicare Other

## 2016-04-20 ENCOUNTER — Inpatient Hospital Stay (HOSPITAL_COMMUNITY)
Admission: EM | Admit: 2016-04-20 | Discharge: 2016-04-22 | DRG: 243 | Disposition: A | Discharge status: Home / Self Care | Payer: Medicare Other | Attending: Cardiovascular Disease | Admitting: Cardiovascular Disease

## 2016-04-20 ENCOUNTER — Encounter: Payer: Self-pay | Admitting: Cardiology

## 2016-04-20 VITALS — Ht 67.5 in | Wt 275.0 lb

## 2016-04-20 DIAGNOSIS — M1990 Unspecified osteoarthritis, unspecified site: Secondary | ICD-10-CM | POA: Diagnosis present

## 2016-04-20 DIAGNOSIS — Z7984 Long term (current) use of oral hypoglycemic drugs: Secondary | ICD-10-CM

## 2016-04-20 DIAGNOSIS — R0602 Shortness of breath: Secondary | ICD-10-CM

## 2016-04-20 DIAGNOSIS — Z79899 Other long term (current) drug therapy: Secondary | ICD-10-CM

## 2016-04-20 DIAGNOSIS — Z794 Long term (current) use of insulin: Secondary | ICD-10-CM | POA: Diagnosis not present

## 2016-04-20 DIAGNOSIS — E119 Type 2 diabetes mellitus without complications: Secondary | ICD-10-CM | POA: Diagnosis present

## 2016-04-20 DIAGNOSIS — I350 Nonrheumatic aortic (valve) stenosis: Secondary | ICD-10-CM

## 2016-04-20 DIAGNOSIS — I10 Essential (primary) hypertension: Secondary | ICD-10-CM | POA: Diagnosis present

## 2016-04-20 DIAGNOSIS — Z6841 Body Mass Index (BMI) 40.0 and over, adult: Secondary | ICD-10-CM

## 2016-04-20 DIAGNOSIS — I442 Atrioventricular block, complete: Principal | ICD-10-CM | POA: Diagnosis present

## 2016-04-20 DIAGNOSIS — N179 Acute kidney failure, unspecified: Secondary | ICD-10-CM | POA: Diagnosis present

## 2016-04-20 DIAGNOSIS — Z7982 Long term (current) use of aspirin: Secondary | ICD-10-CM | POA: Diagnosis not present

## 2016-04-20 DIAGNOSIS — M4800 Spinal stenosis, site unspecified: Secondary | ICD-10-CM

## 2016-04-20 DIAGNOSIS — Z87891 Personal history of nicotine dependence: Secondary | ICD-10-CM

## 2016-04-20 DIAGNOSIS — Z8543 Personal history of malignant neoplasm of ovary: Secondary | ICD-10-CM

## 2016-04-20 DIAGNOSIS — Z853 Personal history of malignant neoplasm of breast: Secondary | ICD-10-CM | POA: Diagnosis not present

## 2016-04-20 DIAGNOSIS — R42 Dizziness and giddiness: Secondary | ICD-10-CM | POA: Diagnosis not present

## 2016-04-20 DIAGNOSIS — IMO0001 Reserved for inherently not codable concepts without codable children: Secondary | ICD-10-CM

## 2016-04-20 DIAGNOSIS — R001 Bradycardia, unspecified: Secondary | ICD-10-CM | POA: Insufficient documentation

## 2016-04-20 DIAGNOSIS — Z923 Personal history of irradiation: Secondary | ICD-10-CM | POA: Diagnosis not present

## 2016-04-20 DIAGNOSIS — Z90710 Acquired absence of both cervix and uterus: Secondary | ICD-10-CM

## 2016-04-20 DIAGNOSIS — Z95818 Presence of other cardiac implants and grafts: Secondary | ICD-10-CM

## 2016-04-20 DIAGNOSIS — Z4682 Encounter for fitting and adjustment of non-vascular catheter: Secondary | ICD-10-CM | POA: Diagnosis not present

## 2016-04-20 DIAGNOSIS — E669 Obesity, unspecified: Secondary | ICD-10-CM | POA: Insufficient documentation

## 2016-04-20 HISTORY — DX: Morbid (severe) obesity due to excess calories: E66.01

## 2016-04-20 HISTORY — DX: Other pulmonary embolism without acute cor pulmonale: I26.99

## 2016-04-20 LAB — TSH: TSH: 0.802 u[IU]/mL (ref 0.350–4.500)

## 2016-04-20 LAB — BASIC METABOLIC PANEL
Anion gap: 12 (ref 5–15)
BUN: 34 mg/dL — AB (ref 6–20)
CALCIUM: 10.4 mg/dL — AB (ref 8.9–10.3)
CHLORIDE: 106 mmol/L (ref 101–111)
CO2: 22 mmol/L (ref 22–32)
CREATININE: 1.49 mg/dL — AB (ref 0.44–1.00)
GFR calc non Af Amer: 32 mL/min — ABNORMAL LOW (ref >60)
GFR, EST AFRICAN AMERICAN: 38 mL/min — AB (ref >60)
GLUCOSE: 163 mg/dL — AB (ref 65–99)
Potassium: 4.3 mmol/L (ref 3.5–5.1)
Sodium: 140 mmol/L (ref 135–145)

## 2016-04-20 LAB — CBC WITH DIFFERENTIAL/PLATELET
BASOS PCT: 0 %
Basophils Absolute: 0 10*3/uL (ref 0.0–0.1)
EOS ABS: 0 10*3/uL (ref 0.0–0.7)
EOS PCT: 1 %
HCT: 36 % (ref 36.0–46.0)
HEMOGLOBIN: 11.7 g/dL — AB (ref 12.0–15.0)
LYMPHS ABS: 1.7 10*3/uL (ref 0.7–4.0)
Lymphocytes Relative: 20 %
MCH: 30.9 pg (ref 26.0–34.0)
MCHC: 32.5 g/dL (ref 30.0–36.0)
MCV: 95 fL (ref 78.0–100.0)
MONO ABS: 0.7 10*3/uL (ref 0.1–1.0)
MONOS PCT: 8 %
NEUTROS PCT: 71 %
Neutro Abs: 6 10*3/uL (ref 1.7–7.7)
Platelets: 179 10*3/uL (ref 150–400)
RBC: 3.79 MIL/uL — ABNORMAL LOW (ref 3.87–5.11)
RDW: 14.6 % (ref 11.5–15.5)
WBC: 8.4 10*3/uL (ref 4.0–10.5)

## 2016-04-20 LAB — GLUCOSE, CAPILLARY
GLUCOSE-CAPILLARY: 142 mg/dL — AB (ref 65–99)
GLUCOSE-CAPILLARY: 136 mg/dL — AB (ref 65–99)

## 2016-04-20 LAB — I-STAT TROPONIN, ED: TROPONIN I, POC: 0.02 ng/mL (ref 0.00–0.08)

## 2016-04-20 LAB — TROPONIN I

## 2016-04-20 LAB — MAGNESIUM: Magnesium: 1.8 mg/dL (ref 1.7–2.4)

## 2016-04-20 LAB — BRAIN NATRIURETIC PEPTIDE: B NATRIURETIC PEPTIDE 5: 612.7 pg/mL — AB (ref 0.0–100.0)

## 2016-04-20 LAB — MRSA PCR SCREENING: MRSA BY PCR: NEGATIVE

## 2016-04-20 MED ORDER — FLUOXETINE HCL 20 MG PO CAPS
40.0000 mg | ORAL_CAPSULE | ORAL | Status: DC
  Administered 2016-04-22: 40 mg via ORAL
  Filled 2016-04-20 (×2): qty 2

## 2016-04-20 MED ORDER — ASPIRIN EC 81 MG PO TBEC: 81.0000 mg | DELAYED_RELEASE_TABLET | Freq: Every day | ORAL | Status: DC

## 2016-04-20 MED ORDER — SODIUM CHLORIDE 0.9 % IV SOLN: 250.0000 mL | INTRAVENOUS | Status: DC | PRN

## 2016-04-20 MED ORDER — INSULIN ASPART 100 UNIT/ML ~~LOC~~ SOLN: 0.0000 [IU] | Freq: Three times a day (TID) | SUBCUTANEOUS | Status: DC

## 2016-04-20 MED ORDER — HEPARIN SODIUM (PORCINE) 5000 UNIT/ML IJ SOLN: 5000.0000 [IU] | Freq: Three times a day (TID) | INTRAMUSCULAR | Status: DC

## 2016-04-20 MED ORDER — ACETAMINOPHEN 325 MG PO TABS: 650.0000 mg | ORAL_TABLET | ORAL | Status: DC | PRN

## 2016-04-20 MED ORDER — SODIUM CHLORIDE 0.9% FLUSH: 3.0000 mL | INTRAVENOUS | Status: DC | PRN

## 2016-04-20 MED ORDER — SODIUM CHLORIDE 0.9 % IV BOLUS (SEPSIS): 500.0000 mL | Freq: Once | INTRAVENOUS | Status: DC

## 2016-04-20 MED ORDER — SODIUM CHLORIDE 0.9% FLUSH: 3.0000 mL | Freq: Two times a day (BID) | INTRAVENOUS | Status: DC

## 2016-04-20 MED ORDER — ATROPINE SULFATE 1 MG/10ML IJ SOSY
0.5000 mg | PREFILLED_SYRINGE | Freq: Once | INTRAMUSCULAR | Status: DC
  Filled 2016-04-20: qty 10

## 2016-04-20 MED ORDER — MELOXICAM 7.5 MG PO TABS
7.5000 mg | ORAL_TABLET | Freq: Every day | ORAL | Status: DC | PRN
  Filled 2016-04-20: qty 1

## 2016-04-20 MED ORDER — ORAL CARE MOUTH RINSE
15.0000 mL | Freq: Two times a day (BID) | OROMUCOSAL | Status: DC
  Administered 2016-04-20 – 2016-04-21 (×3): 15 mL via OROMUCOSAL

## 2016-04-20 MED ORDER — ATORVASTATIN CALCIUM 20 MG PO TABS: 20.0000 mg | ORAL_TABLET | Freq: Every evening | ORAL | Status: DC

## 2016-04-20 MED ORDER — NITROGLYCERIN 0.4 MG SL SUBL: 0.4000 mg | SUBLINGUAL_TABLET | SUBLINGUAL | Status: DC | PRN

## 2016-04-20 MED ORDER — INSULIN ASPART 100 UNIT/ML ~~LOC~~ SOLN: 0.0000 [IU] | Freq: Every day | SUBCUTANEOUS | Status: DC

## 2016-04-20 MED ORDER — INSULIN DETEMIR 100 UNIT/ML ~~LOC~~ SOLN: 70.0000 [IU] | Freq: Two times a day (BID) | SUBCUTANEOUS | Status: DC

## 2016-04-20 MED ORDER — ONDANSETRON HCL 4 MG/2ML IJ SOLN: 4.0000 mg | Freq: Four times a day (QID) | INTRAMUSCULAR | Status: DC | PRN

## 2016-04-20 NOTE — ED Notes (Signed)
Pt given water per Ruth(RN) 

## 2016-04-20 NOTE — ED Triage Notes (Signed)
Pt from doctor with dizzy feeling and bradycardia in the 30's

## 2016-04-20 NOTE — Assessment & Plan Note (Signed)
2006-lumpectomy and radiation

## 2016-04-20 NOTE — Assessment & Plan Note (Signed)
AS murmur on exam

## 2016-04-20 NOTE — Progress Notes (Signed)
04/20/2016 [name]   11-18-1935  [id number]  Primary Physician Gerrit Heck, MD Primary Cardiologist: New  HPI:  81 y/o morbidly obese female referred to [place] for evaluation of weakness and LE edema. She was recently seen by her PCP and sent to the ED after a D-dimer came back elevated. CTA was negative for PE. It was suggested she come here for an echo. This morning the pt and her husband say she was so weak she almost "went out". She did become diaphoretic at one point. In the office her HR is 32 and she has an AS murmur that sounds at least moderate on exam. She is awake and alert. She denies any chest pain. She has dyspnea with any exertion but she has attributed this to DJD and obesity. She admits to increasing fatigue and LE edema for the past few months. She says she had a remote echo and stress test in the past and was told she had a valve issue but it wasn't a problem at that time.    Current Outpatient Prescriptions  Medication Sig Dispense Refill  . aspirin 81 MG tablet Take 81 mg by mouth daily.      [name] Kitchen atorvastatin (LIPITOR) 20 MG tablet Take 20 mg by mouth every evening.      . Calcium Citrate-Vitamin D (CALCIUM CITRATE + PO) Take by mouth 2 (two) times daily.     . Cholecalciferol (VITAMIN D) 2000 UNITS tablet Take 3,000 Units by mouth daily. 2,000 IU in am, and 1,000 IU in the pm.     . clobetasol cream (TEMOVATE) [id number] % Apply 1 application topically 2 (two) times daily as needed for rash. irritations    . fish oil-omega-3 fatty acids 1000 MG capsule Take 1 g by mouth daily.     [name] Kitchen FLUoxetine (PROZAC) 40 MG capsule Take 40 mg by mouth every morning.      . furosemide (LASIX) 40 MG tablet Take 40 mg by mouth daily.    . insulin detemir (LEVEMIR) 100 UNIT/ML injection Inject 70 Units into the skin 2 (two) times daily. Once in a.m. - once in p.m.     . Magnesium 400 MG CAPS Take 400 mg by mouth every evening.     . meloxicam (MOBIC) 7.5 MG tablet Take 7.5 mg by  mouth daily as needed for pain.    . metFORMIN (GLUCOPHAGE) 1000 MG tablet Take 1,000 mg by mouth 2 (two) times daily with a meal.     . metoprolol (LOPRESSOR) 50 MG tablet Take 25 mg by mouth 2 (two) times daily.     . Multiple Vitamin (MULTIVITAMIN) tablet Take 1 tablet by mouth daily.      [name] Kitchen olmesartan (BENICAR) 20 MG tablet Take 10 mg by mouth every evening.     No current facility-administered medications for this visit.     Allergies  Allergen Reactions  . Other Rash    CLEAR PLASTIC TAPE   Past Medical History:  Diagnosis Date  . Arthritis   . Arthritis   . Cancer [facility])    CANCER  . Diabetes mellitus   . Hypertension   . Ovarian cancer ([place])   . Spinal stenosis    Social History   Social History  . Marital status: Married    Spouse name: N/A  . Number of children: N/A  . Years of education: N/A   Occupational History  . Not on file.   Social History Main Topics  .  Smoking status: Former Research scientist (life sciences)  . Smokeless tobacco: Never Used  . Alcohol use No  . Drug use: No  . Sexual activity: Not on file   Other Topics Concern  . Not on file   Social History Narrative  . No narrative on file    Family History  Problem Relation Age of Onset  . Cancer Father     BLADDER  . Cancer Brother   . Cancer Sister     Review of Systems: General: negative for chills, fever, night sweats or weight changes.  Cardiovascular: negative for chest pain, orthopnea, palpitations, paroxysmal nocturnal dyspnea  Dermatological: negative for rash Respiratory: negative for cough or wheezing Urologic: negative for hematuria-history of nephrolithiasis Abdominal: negative for nausea, vomiting, diarrhea, bright red blood per rectum, melena, or hematemesis Neurologic: negative for visual changes, syncope, or dizziness She has DJD and spinal stenosis She admits she snores, no history of sleep apnea work up.  All other systems reviewed and are otherwise negative except as noted  above.    Height 5' 7.5" (1.715 m), weight 275 lb (124.7 kg).  General appearance: alert, cooperative, no distress, morbidly obese and pale Neck: no JVD and tarnsmitted murmur Lungs: clear to auscultation bilaterally Heart: regular rate and rhythm and regular rate, [id number] holsytolic murmur and decreasd S2 Abdomen: obese, non tender Extremities: 2+ edema Pulses: 2+ and symmetric Skin: pale, cool, dry Neurologic: Grossly normal  EKG Reviewed with Dr Rulon Sera to be second degree AVB type 1-effective rate 32  ASSESSMENT AND PLAN:   Bradycardia Pt seen in the office for weakness- "I'm here for an echo". HR 32  Morbid obesity (HCC) BMI 42  Essential hypertension Unable to obtain B/O but palpable pulse  Insulin dependent diabetes mellitus ([place]) On Glucophge  History of breast cancer 2006-lumpectomy and radiation  History of ovarian cancer 2013  Aortic stenosis AS murmur on exam  Spinal stenosis Spinal stenosis and DJD   PLAN  Pt seen by Dr Oval Linsey and myself in the office. Transport to [facility] ED. Stop beta blocker, check echo, further recommendations pending the above.   Kerin Ransom PA-C 04/20/2016 10:06 AM

## 2016-04-20 NOTE — ED Provider Notes (Signed)
[facility] DEPT Provider Note   CSN: [id number] Arrival date & time: 04/20/16  1100     History   Chief Complaint Chief Complaint  Patient presents with  . Bradycardia    HPI [name] is a 81 y.o. female.  HPI  81 year old female with a history of diabetes and obesity presents with dizziness and bradycardia. Has been having progressive shortness of breath and swelling and weight gain several months. Can no longer lay flat. Saw PCP and was referred to cardiology for echo and evaluation for CHF. Was at the cardiology office today and noticed to be in a bradycardic rhythm in the 30s. Patient states that since this morning she has been dizzy whenever standing and may be passed out briefly. At rest she does not feel lightheaded. Short of breath is worse today when exerting herself. No chest pain. Takes metoprolol, most recently took this morning.  Past Medical History:  Diagnosis Date  . Arthritis   . Arthritis   . Cancer [facility])    CANCER  . Diabetes mellitus   . Hypertension   . Ovarian cancer ([place])   . Spinal stenosis     Patient Active Problem List   Diagnosis Date Noted  . Bradycardia 04/20/2016  . Morbid obesity ([place]) 04/20/2016  . Essential hypertension 04/20/2016  . Insulin dependent diabetes mellitus ([place]) 04/20/2016  . History of ovarian cancer 04/20/2016  . Aortic stenosis 04/20/2016  . Spinal stenosis 04/20/2016  . History of breast cancer 12/27/2010    Past Surgical History:  Procedure Laterality Date  . ABDOMINAL HYSTERECTOMY    . BREAST SURGERY  03-2004   lumpectomy     OB History    No data available       Home Medications    Prior to Admission medications   Medication Sig Start Date End Date Taking? Authorizing Provider  aspirin 81 MG tablet Take 81 mg by mouth daily.     Yes Historical Provider, MD  atorvastatin (LIPITOR) 20 MG tablet Take 20 mg by mouth every evening.     Yes Historical Provider, MD  Calcium Citrate-Vitamin D  (CALCIUM CITRATE + PO) Take by mouth 2 (two) times daily.    Yes Historical Provider, MD  Cholecalciferol (VITAMIN D) 2000 UNITS tablet Take 3,000 Units by mouth daily. 2,000 IU in am, and 1,000 IU in the pm.    Yes Historical Provider, MD  clobetasol cream (TEMOVATE) [id number] % Apply 1 application topically 2 (two) times daily as needed for rash. irritations 03/20/16  Yes Historical Provider, MD  fish oil-omega-3 fatty acids 1000 MG capsule Take 1 g by mouth daily.    Yes Historical Provider, MD  FLUoxetine (PROZAC) 40 MG capsule Take 40 mg by mouth every morning.     Yes Historical Provider, MD  furosemide (LASIX) 40 MG tablet Take 40 mg by mouth daily.   Yes Historical Provider, MD  insulin detemir (LEVEMIR) 100 UNIT/ML injection Inject 70 Units into the skin 2 (two) times daily. Once in a.m. - once in p.m.    Yes Historical Provider, MD  Magnesium 400 MG CAPS Take 400 mg by mouth every evening.    Yes Historical Provider, MD  meloxicam (MOBIC) 7.5 MG tablet Take 7.5 mg by mouth daily as needed for pain. 04/04/16  Yes Historical Provider, MD  metFORMIN (GLUCOPHAGE) 1000 MG tablet Take 1,000 mg by mouth 2 (two) times daily with a meal.    Yes Historical Provider, MD  metoprolol (LOPRESSOR) 50 MG  tablet Take 25 mg by mouth 2 (two) times daily.    Yes Historical Provider, MD  Multiple Vitamin (MULTIVITAMIN) tablet Take 1 tablet by mouth daily.     Yes Historical Provider, MD  olmesartan (BENICAR) 20 MG tablet Take 10 mg by mouth every evening.   Yes Historical Provider, MD    Family History Family History  Problem Relation Age of Onset  . Cancer Father     BLADDER  . Cancer Brother   . Cancer Sister     Social History Social History  Substance Use Topics  . Smoking status: Former Research scientist (life sciences)  . Smokeless tobacco: Never Used  . Alcohol use No     Allergies   Other   Review of Systems Review of Systems  Respiratory: Positive for shortness of breath.   Cardiovascular: Positive for leg  swelling. Negative for chest pain.  Neurological: Positive for syncope, weakness and light-headedness.  All other systems reviewed and are negative.    Physical Exam Updated Vital Signs BP (!) 108/42   Pulse (!) 28   Resp 19   SpO2 100%   Physical Exam  Constitutional: She is oriented to person, place, and time. She appears well-developed and well-nourished. No distress.  Morbidly obese Resting comfortably, no distress  HENT:  Head: Normocephalic and atraumatic.  Right Ear: External ear normal.  Left Ear: External ear normal.  Nose: Nose normal.  Eyes: Right eye exhibits no discharge. Left eye exhibits no discharge.  Cardiovascular: Regular rhythm and normal heart sounds.  Bradycardia present.   Pulses:      Radial pulses are 2+ on the right side, and 2+ on the left side.  Pulmonary/Chest: Effort normal. She has decreased breath sounds in the right lower field and the left lower field.  Abdominal: Soft. There is no tenderness.  Musculoskeletal: She exhibits edema (BLE edema).  Neurological: She is alert and oriented to person, place, and time.  Skin: Skin is warm and dry. She is not diaphoretic.  Nursing note and vitals reviewed.    ED Treatments / Results  Labs (all labs ordered are listed, but only abnormal results are displayed) Labs Reviewed  BASIC METABOLIC PANEL  CBC WITH DIFFERENTIAL/PLATELET  TROPONIN I  MAGNESIUM  BRAIN NATRIURETIC PEPTIDE  I-STAT TROPOININ, ED    EKG  EKG Interpretation  Date/Time:  Thursday April 20 2016 11:10:31 EST Ventricular Rate:  33 PR Interval:    QRS Duration: 133 QT Interval:  636 QTC Calculation: 472 R Axis:   125 Text Interpretation:  AV block, complete (third degree) Nonspecific intraventricular conduction delay Lateral infarct, age indeterminate Anteroseptal infarct, old Third degree heart block Confirmed by Harriett Azar MD, Cristopher Ciccarelli [phone]) on 04/20/2016 11:56:19 AM       Radiology Dg Chest Portable 1 View  Result  Date: 04/20/2016 CLINICAL DATA:  Heart block, shortness of breath EXAM: PORTABLE CHEST 1 VIEW COMPARISON:  04/12/2016 FINDINGS: Cardiomediastinal silhouette is stable. No infiltrate or pleural effusion. No pulmonary edema. Degenerative changes bilateral shoulders again noted. IMPRESSION: No active disease. Electronically Signed   By: Lahoma Crocker M.D.   On: 04/20/2016 11:47    Procedures Procedures (including critical care time)  CRITICAL CARE Performed by: Sherwood Gambler T   Total critical care time: 30 minutes  Critical care time was exclusive of separately billable procedures and treating other patients.  Critical care was necessary to treat or prevent imminent or life-threatening deterioration.  Critical care was time spent personally by me on the following activities: development  of treatment plan with patient and/or surrogate as well as nursing, discussions with consultants, evaluation of patient's response to treatment, examination of patient, obtaining history from patient or surrogate, ordering and performing treatments and interventions, ordering and review of laboratory studies, ordering and review of radiographic studies, pulse oximetry and re-evaluation of patient's condition.   Medications Ordered in ED Medications  sodium chloride 0.9 % bolus 500 mL (not administered)     Initial Impression / Assessment and Plan / ED Course  I have reviewed the triage vital signs and the nursing notes.  Pertinent labs & imaging results that were available during my care of the patient were reviewed by me and considered in my medical decision making (see chart for details).     Patient presents with symptomatic complete heart block. BP in 80s-100s. Strong pulses. No signs/symptoms of poor perfusion at rest. Symptoms appear with movement/exertion. Will hold on atropine or pacing. Cards to admit. Has not taken any extra metoprolol. Doubt overdose.   Final Clinical Impressions(s) / ED  Diagnoses   Final diagnoses:  Complete heart block [facility])    New Prescriptions New Prescriptions   No medications on file     Sherwood Gambler, MD 04/20/16 1821

## 2016-04-20 NOTE — Assessment & Plan Note (Signed)
Pt seen in the office for weakness- "I'm here for an echo". HR 32

## 2016-04-20 NOTE — Progress Notes (Signed)
Office records from PCP received. Pt had seen Dr Marlou Porch in 2013. Echo then suggested moderate AS with preserved LVF. Myoview in 2009 was low risk.   Kerin Ransom PA-C 04/20/2016 1:38 PM

## 2016-04-20 NOTE — Assessment & Plan Note (Signed)
2013 

## 2016-04-20 NOTE — Addendum Note (Signed)
Addended by: Erlene Quan on: 04/20/2016 10:38 AM   Modules accepted: Orders

## 2016-04-20 NOTE — Assessment & Plan Note (Signed)
BMI 42 

## 2016-04-20 NOTE — Addendum Note (Signed)
Addended by: Erlene Quan on: 04/20/2016 10:41 AM   Modules accepted: Orders

## 2016-04-20 NOTE — ED Notes (Signed)
Pt placed on zoll pads due to low heart rate.

## 2016-04-20 NOTE — ED Notes (Signed)
Pt husbands phone numbers are:  Cell [phone] Home [phone]

## 2016-04-20 NOTE — H&P (Signed)
ELECTROPHYSIOLOGY HISTORY AND PHYSICAL     Patient ID: [name] MRN: [id number], DOB/AGE: 1935/05/12 81 y.o.  Admit date: 04/20/2016 Date of Consult: 04/20/2016  Primary Physician: Gerrit Heck, MD Primary Cardiologist: new to [facility] Referring MD: ER  Reason for Consultation: complete heart block   HPI:  [name] is a 81 y.o. female with a past medical history significant for hypertension, diabetes, ovarian cancer, breast cancer (s/p left lumpectomy, port on right), obesity, arthritis, and spinal stenosis. She has had worsening shortness of breath with exertion, lower extremity edema, and exercise intolerance for the last several weeks. She was seen by her PCP and referred for echocardiogram.  This morning while getting ready to go to the office, she had pre-syncope, diaphoresis, and severe weakness.  She presented to the office with complete heart block and was transferred to [facility] for further evaluation.   Last echo 2013 demonstrated normal LVEF, moderate AS. Myoview 2009 low risk.  Echo ordered this admission.  Currently in bed, she reports no chest pain, shortness of breath, LE edema, recent fevers, chills, nausea or vomiting.  She does not think she has had frank syncope.   She has been on Metoprolol tartrate at home for HTN with last dose this morning around 7:30AM.   Past Medical History:  Diagnosis Date  . Arthritis   . Arthritis   . Cancer [facility])    CANCER  . Diabetes mellitus   . Hypertension   . Ovarian cancer ([place])   . Spinal stenosis      Surgical History:  Past Surgical History:  Procedure Laterality Date  . ABDOMINAL HYSTERECTOMY    . BREAST SURGERY  03-2004   lumpectomy     No current facility-administered medications for this encounter.   Current Outpatient Prescriptions:  .  aspirin 81 MG tablet, Take 81 mg by mouth daily.  , Disp: , Rfl:  .  atorvastatin (LIPITOR) 20 MG tablet, Take 20 mg by mouth every evening.  , Disp: , Rfl:   .  Calcium Citrate-Vitamin D (CALCIUM CITRATE + PO), Take by mouth 2 (two) times daily. , Disp: , Rfl:  .  Cholecalciferol (VITAMIN D) 2000 UNITS tablet, Take 3,000 Units by mouth daily. 2,000 IU in am, and 1,000 IU in the pm. , Disp: , Rfl:  .  clobetasol cream (TEMOVATE) [id number] %, Apply 1 application topically 2 (two) times daily as needed for rash. irritations, Disp: , Rfl:  .  fish oil-omega-3 fatty acids 1000 MG capsule, Take 1 g by mouth daily. , Disp: , Rfl:  .  FLUoxetine (PROZAC) 40 MG capsule, Take 40 mg by mouth every morning.  , Disp: , Rfl:  .  furosemide (LASIX) 40 MG tablet, Take 40 mg by mouth daily., Disp: , Rfl:  .  insulin detemir (LEVEMIR) 100 UNIT/ML injection, Inject 70 Units into the skin 2 (two) times daily. Once in a.m. - once in p.m. , Disp: , Rfl:  .  Magnesium 400 MG CAPS, Take 400 mg by mouth every evening. , Disp: , Rfl:  .  meloxicam (MOBIC) 7.5 MG tablet, Take 7.5 mg by mouth daily as needed for pain., Disp: , Rfl:  .  metFORMIN (GLUCOPHAGE) 1000 MG tablet, Take 1,000 mg by mouth 2 (two) times daily with a meal. , Disp: , Rfl:  .  metoprolol (LOPRESSOR) 50 MG tablet, Take 25 mg by mouth 2 (two) times daily. , Disp: , Rfl:  .  Multiple Vitamin (MULTIVITAMIN) tablet, Take  1 tablet by mouth daily.  , Disp: , Rfl:  .  olmesartan (BENICAR) 20 MG tablet, Take 10 mg by mouth every evening., Disp: , Rfl:    Allergies:  Allergies  Allergen Reactions  . Other Rash    CLEAR PLASTIC TAPE    Social History   Social History  . Marital status: Married    Spouse name: N/A  . Number of children: N/A  . Years of education: N/A   Occupational History  . Not on file.   Social History Main Topics  . Smoking status: Former Research scientist (life sciences)  . Smokeless tobacco: Never Used  . Alcohol use No  . Drug use: No  . Sexual activity: Not on file   Other Topics Concern  . Not on file   Social History Narrative  . No narrative on file     Family History  Problem Relation Age of  Onset  . Cancer Father     BLADDER  . Cancer Brother   . Cancer Sister      Review of Systems: All other systems reviewed and are otherwise negative except as noted above.  Physical Exam: Vitals:   04/20/16 1200 04/20/16 1215 04/20/16 1230 04/20/16 1245  BP: (!) 94/46 (!) 114/48 (!) 113/51 (!) 105/43  Pulse: (!) 34 (!) 33 65 (!) 33  Resp: 17 14 24 20   SpO2: 97% 100% 100% 98%    GEN- The patient is obese appearing, alert and oriented x 3 today.   HEENT: normocephalic, atraumatic; sclera clear, conjunctiva pink; hearing intact; oropharynx clear; neck supple  Lungs- Clear to ausculation bilaterally, normal work of breathing.  No wheezes, rales, rhonchi Heart- Bradycardic regular rate and rhythm, 2/6 SEM GI- soft, non-tender, non-distended, bowel sounds present  Extremities- no clubbing, cyanosis, +dependent BLE edema MS- no significant deformity or atrophy Skin- warm and dry, no rash or lesion Psych- euthymic mood, full affect Neuro- strength and sensation are intact  Labs:   Lab Results  Component Value Date   WBC 8.4 04/20/2016   HGB 11.7 (L) 04/20/2016   HCT 36.0 04/20/2016   MCV 95.0 04/20/2016   PLT 179 04/20/2016    Recent Labs Lab 04/20/16 1126  NA 140  K 4.3  CL 106  CO2 22  BUN 34*  CREATININE 1.49*  CALCIUM 10.4*  GLUCOSE 163*      Radiology/Studies: Dg Chest 2 View Result Date: 04/12/2016 CLINICAL DATA:  Shortness of breath. EXAM: CHEST  2 VIEW COMPARISON:  02/12/2008 FINDINGS: Heart is at the upper limits of normal in size. Pulmonary vascularity is at the upper limits of normal. No infiltrates or effusions. No acute bone abnormality. Arthritic changes of both shoulders. IMPRESSION: Borderline cardiomegaly. Electronically Signed   By: Lorriane Shire M.D.   On: 04/12/2016 07:59   Ct Angio Chest Pe W Or Wo Contrast Result Date: 04/12/2016 CLINICAL DATA:  Shortness of Breath EXAM: CT ANGIOGRAPHY CHEST WITH CONTRAST TECHNIQUE: Multidetector CT imaging of the  chest was performed using the standard protocol during bolus administration of intravenous contrast. Multiplanar CT image reconstructions and MIPs were obtained to evaluate the vascular anatomy. CONTRAST:  80 mL Isovue 370. COMPARISON:  04/12/2016, 04/14/2004 FINDINGS: Cardiovascular: Thoracic aorta demonstrates atherosclerotic calcifications without aneurysmal dilatation or dissection. Aortic valve calcifications are seen. Coronary calcifications are noted as are mitral valve calcifications. Pulmonary artery is well visualize without filling defect to suggest pulmonary embolism. Mild cardiac enlargement is noted. No pericardial effusion is seen. Mediastinum/Nodes: Thoracic inlet again demonstrates calcified  left thyroid nodules. These are stable from prior exam. The largest of these measures approximately 1 cm. 18 mm short axis lymph node is noted adjacent to the carina best seen on image number are 97 of series 7. Scattered smaller mediastinal lymph nodes are seen. No significant hilar adenopathy is noted. Lungs/Pleura: The lungs are well aerated bilaterally. No focal infiltrate or sizable effusion is seen. No parenchymal nodules are noted. Upper Abdomen: Left renal cystic changes are noted. A small hiatal hernia is noted as well. Musculoskeletal: Degenerative changes of the thoracic spine are noted. Review of the MIP images confirms the above findings. IMPRESSION: No evidence of pulmonary emboli. Prominent lymph node in the right paratracheal/precarinal region as described. This is of uncertain significance as no other significant adenopathy is seen. Short-term follow-up examination may be helpful to assess for stability or resolution. Electronically Signed   By: Inez Catalina M.D.   On: 04/12/2016 11:15   Dg Chest Portable 1 View Result Date: 04/20/2016 CLINICAL DATA:  Heart block, shortness of breath EXAM: PORTABLE CHEST 1 VIEW COMPARISON:  04/12/2016 FINDINGS: Cardiomediastinal silhouette is stable. No  infiltrate or pleural effusion. No pulmonary edema. Degenerative changes bilateral shoulders again noted. IMPRESSION: No active disease. Electronically Signed   By: Lahoma Crocker M.D.   On: 04/20/2016 11:47    EKG: complete heart block, ventricular rate 32  TELEMETRY: complete heart block, V rates 30's  Assessment/Plan: 1.  Symptomatic complete heart block The patient has symptomatic complete heart block. She is on Metoprolol tartrate at home with last dose this morning. Hold BB for now. Will plan pacemaker tomorrow afternoon if no improvement in conduction. Risks, benefits reviewed with patient and husband who wishes to proceed. Update echo She has had prior breast cancer (s/p lumpectomy on left, port-a-cath on right) - would plan left sided pacemaker implant.  Admit to stepdown Would avoid temporary pacing if possible  2.  Obesity Weight loss advised   3.  HTN Stable No change required today  4.  Moderate AS by echo 2013 Update echo prior to pacemaker implant   5.  AKI Will follow Anticipate will improve w AV synchrony   Dr Rayann Heman to see later today  Signed, Chanetta Marshall, NP 04/20/2016 1:17 PM  I have seen, examined the patient, and reviewed the above assessment and plan.  On exam, bradycardic regular rhythm.   Changes to above are made where necessary.  The patient has symptomatic complete heart block.  Will allow metoprolol to washout overnight with close inpatient stepdown observation.  If conduction does not improve, would anticipate PPM implantation tomorrow by Dr Curt Bears.   Co Sign: Thompson Grayer, MD 04/20/2016 3:43 PM

## 2016-04-20 NOTE — Assessment & Plan Note (Signed)
On Glucophge

## 2016-04-20 NOTE — Assessment & Plan Note (Signed)
Spinal stenosis and DJD

## 2016-04-20 NOTE — Assessment & Plan Note (Signed)
Unable to obtain B/O but palpable pulse

## 2016-04-21 ENCOUNTER — Encounter (HOSPITAL_COMMUNITY): Payer: Self-pay | Admitting: Cardiology

## 2016-04-21 ENCOUNTER — Inpatient Hospital Stay (HOSPITAL_COMMUNITY): Payer: Medicare Other

## 2016-04-21 ENCOUNTER — Encounter (HOSPITAL_COMMUNITY)
Admission: EM | Disposition: A | Discharge status: Home / Self Care | Payer: Self-pay | Source: Home / Self Care | Attending: Cardiovascular Disease

## 2016-04-21 DIAGNOSIS — R001 Bradycardia, unspecified: Secondary | ICD-10-CM

## 2016-04-21 DIAGNOSIS — I442 Atrioventricular block, complete: Secondary | ICD-10-CM

## 2016-04-21 HISTORY — PX: PACEMAKER IMPLANT: EP1218

## 2016-04-21 LAB — CBC
HEMATOCRIT: 34.2 % — AB (ref 36.0–46.0)
Hemoglobin: 10.9 g/dL — ABNORMAL LOW (ref 12.0–15.0)
MCH: 30.4 pg (ref 26.0–34.0)
MCHC: 31.9 g/dL (ref 30.0–36.0)
MCV: 95.5 fL (ref 78.0–100.0)
Platelets: 183 10*3/uL (ref 150–400)
RBC: 3.58 MIL/uL — ABNORMAL LOW (ref 3.87–5.11)
RDW: 14.8 % (ref 11.5–15.5)
WBC: 8.3 10*3/uL (ref 4.0–10.5)

## 2016-04-21 LAB — BASIC METABOLIC PANEL
ANION GAP: 10 (ref 5–15)
BUN: 38 mg/dL — ABNORMAL HIGH (ref 6–20)
CALCIUM: 10 mg/dL (ref 8.9–10.3)
CO2: 24 mmol/L (ref 22–32)
Chloride: 107 mmol/L (ref 101–111)
Creatinine, Ser: 1.53 mg/dL — ABNORMAL HIGH (ref 0.44–1.00)
GFR calc Af Amer: 36 mL/min — ABNORMAL LOW (ref >60)
GFR, EST NON AFRICAN AMERICAN: 31 mL/min — AB (ref >60)
Glucose, Bld: 124 mg/dL — ABNORMAL HIGH (ref 65–99)
POTASSIUM: 4.7 mmol/L (ref 3.5–5.1)
Sodium: 141 mmol/L (ref 135–145)

## 2016-04-21 LAB — GLUCOSE, CAPILLARY
GLUCOSE-CAPILLARY: 93 mg/dL (ref 65–99)
GLUCOSE-CAPILLARY: 128 mg/dL — AB (ref 65–99)
Glucose-Capillary: 173 mg/dL — ABNORMAL HIGH (ref 65–99)
Glucose-Capillary: 130 mg/dL — ABNORMAL HIGH (ref 65–99)

## 2016-04-21 LAB — ECHOCARDIOGRAM COMPLETE
Height: 67 in
WEIGHTICAEL: 4673.6 [oz_av]

## 2016-04-21 LAB — SURGICAL PCR SCREEN
MRSA, PCR: NEGATIVE
Staphylococcus aureus: NEGATIVE

## 2016-04-21 SURGERY — PACEMAKER IMPLANT
Anesthesia: LOCAL

## 2016-04-21 MED ORDER — SODIUM CHLORIDE 0.9% FLUSH: 3.0000 mL | Freq: Two times a day (BID) | INTRAVENOUS | Status: DC

## 2016-04-21 MED ORDER — ACETAMINOPHEN 325 MG PO TABS: 325.0000 mg | ORAL_TABLET | ORAL | Status: DC | PRN

## 2016-04-21 MED ORDER — SODIUM CHLORIDE 0.9 % IV SOLN
INTRAVENOUS | Status: DC
  Administered 2016-04-21: 09:00:00 via INTRAVENOUS

## 2016-04-21 MED ORDER — FENTANYL CITRATE (PF) 100 MCG/2ML IJ SOLN
INTRAMUSCULAR | Status: DC | PRN
  Administered 2016-04-21 (×4): 25 ug via INTRAVENOUS

## 2016-04-21 MED ORDER — SODIUM CHLORIDE 0.9 % IV SOLN: 250.0000 mL | INTRAVENOUS | Status: DC

## 2016-04-21 MED ORDER — SODIUM CHLORIDE 0.9 % IR SOLN
80.0000 mg | Status: AC
  Administered 2016-04-21: 80 mg

## 2016-04-21 MED ORDER — CHLORHEXIDINE GLUCONATE 4 % EX LIQD
60.0000 mL | Freq: Once | CUTANEOUS | Status: AC
  Administered 2016-04-21: 4 via TOPICAL
  Filled 2016-04-21: qty 15

## 2016-04-21 MED ORDER — OFF THE BEAT BOOK
Freq: Once | Status: AC
  Administered 2016-04-21: 21:00:00
  Filled 2016-04-21: qty 1

## 2016-04-21 MED ORDER — LIDOCAINE HCL (PF) 1 % IJ SOLN
INTRAMUSCULAR | Status: AC
  Filled 2016-04-21: qty 60

## 2016-04-21 MED ORDER — DEXTROSE 5 % IV SOLN
3.0000 g | INTRAVENOUS | Status: AC
  Administered 2016-04-21: 3 g via INTRAVENOUS
  Filled 2016-04-21 (×2): qty 3000

## 2016-04-21 MED ORDER — YOU HAVE A PACEMAKER BOOK
Freq: Once | Status: AC
  Administered 2016-04-21: 21:00:00
  Filled 2016-04-21: qty 1

## 2016-04-21 MED ORDER — HEPARIN (PORCINE) IN NACL 2-0.9 UNIT/ML-% IJ SOLN
INTRAMUSCULAR | Status: DC | PRN
  Administered 2016-04-21: 500 mL

## 2016-04-21 MED ORDER — ONDANSETRON HCL 4 MG/2ML IJ SOLN: 4.0000 mg | Freq: Four times a day (QID) | INTRAMUSCULAR | Status: DC | PRN

## 2016-04-21 MED ORDER — FENTANYL CITRATE (PF) 100 MCG/2ML IJ SOLN
INTRAMUSCULAR | Status: AC
  Filled 2016-04-21: qty 2

## 2016-04-21 MED ORDER — CEFAZOLIN IN D5W 1 GM/50ML IV SOLN
1.0000 g | Freq: Four times a day (QID) | INTRAVENOUS | Status: AC
  Administered 2016-04-21 – 2016-04-22 (×3): 1 g via INTRAVENOUS
  Filled 2016-04-21 (×3): qty 50

## 2016-04-21 MED ORDER — HEPARIN (PORCINE) IN NACL 2-0.9 UNIT/ML-% IJ SOLN
INTRAMUSCULAR | Status: AC
  Filled 2016-04-21: qty 500

## 2016-04-21 MED ORDER — PERFLUTREN LIPID MICROSPHERE
1.0000 mL | INTRAVENOUS | Status: AC | PRN
  Administered 2016-04-21: 2 mL via INTRAVENOUS
  Filled 2016-04-21: qty 10

## 2016-04-21 MED ORDER — MIDAZOLAM HCL 5 MG/5ML IJ SOLN
INTRAMUSCULAR | Status: AC
  Filled 2016-04-21: qty 5

## 2016-04-21 MED ORDER — SODIUM CHLORIDE 0.9% FLUSH: 3.0000 mL | INTRAVENOUS | Status: DC | PRN

## 2016-04-21 MED ORDER — LIDOCAINE HCL (PF) 1 % IJ SOLN
INTRAMUSCULAR | Status: DC | PRN
  Administered 2016-04-21: 44 mL

## 2016-04-21 MED ORDER — GENTAMICIN SULFATE 40 MG/ML IJ SOLN
INTRAMUSCULAR | Status: AC
  Filled 2016-04-21: qty 2

## 2016-04-21 SURGICAL SUPPLY — 8 items
CABLE SURGICAL S-101-97-12 (CABLE) ×1 IMPLANT
HOVERMATT SINGLE USE (MISCELLANEOUS) ×1 IMPLANT
LEAD TENDRIL MRI 52CM LPA1200M (Lead) ×1 IMPLANT
LEAD TENDRIL MRI 58CM LPA1200M (Lead) ×1 IMPLANT
PACEMAKER ASSURITY DR-RF (Pacemaker) ×1 IMPLANT
PAD DEFIB LIFELINK (PAD) ×1 IMPLANT
SHEATH CLASSIC 8F (SHEATH) ×2 IMPLANT
TRAY PACEMAKER INSERTION (PACKS) ×1 IMPLANT

## 2016-04-21 NOTE — Progress Notes (Signed)
SUBJECTIVE: The patient is tired, otherwise doing well today.  At this time, she denies chest pain, shortness of breath, or any new concerns.  [name] Kitchen atorvastatin  20 mg Oral QPM  . FLUoxetine  40 mg Oral [id number]  . insulin aspart  0-15 Units Subcutaneous TID WC  . insulin aspart  0-5 Units Subcutaneous QHS  . mouth rinse  15 mL Mouth Rinse BID     OBJECTIVE: Physical Exam: Vitals:   04/21/16 0000 04/21/16 0100 04/21/16 0300 04/21/16 0500  BP:  (!) 113/39 (!) 121/45   Pulse:      Resp:      Temp:      TempSrc:   Oral   SpO2: 97% 98% 96%   Weight:    292 lb 1.6 oz (132.5 kg)  Height:        Intake/Output Summary (Last 24 hours) at 04/21/16 0802 Last data filed at 04/20/16 1800  Gross per 24 hour  Intake              240 ml  Output              500 ml  Net             -260 ml    Telemetry is reviewed by myself, CHB, V rate 30's  GEN- The patient is obese, well appearing, alert and oriented x 3 today.   Head- normocephalic, atraumatic Eyes-  Sclera clear, conjunctiva pink Ears- hearing intact Oropharynx- clear Neck- supple, no JVP Lungs- CTA b/l, normal work of breathing Heart- RRR, bradycardic, 1-2/6 SM, no rubs or gallops GI- soft, NT, ND Extremities- no clubbing, cyanosis, or edema Skin- no rash or lesion Psych- euthymic mood, full affect Neuro- no gross deficits appreciated  LABS: Basic Metabolic Panel:  Recent Labs  04/20/16 1126 04/21/16 0336  NA 140 141  K 4.3 4.7  CL 106 107  CO2 22 24  GLUCOSE 163* 124*  BUN 34* 38*  CREATININE 1.49* 1.53*  CALCIUM 10.4* 10.0  MG 1.8  --    Liver Function Tests: No results for input(s): AST, ALT, ALKPHOS, BILITOT, PROT, ALBUMIN in the last 72 hours. No results for input(s): LIPASE, AMYLASE in the last 72 hours. CBC:  Recent Labs  04/20/16 1126 04/21/16 0336  WBC 8.4 8.3  NEUTROABS 6.0  --   HGB 11.7* 10.9*  HCT 36.0 34.2*  MCV 95.0 95.5  PLT 179 183   Cardiac Enzymes:  Recent Labs   04/20/16 1126  TROPONINI <0.03   Thyroid Function Tests:  Recent Labs  04/20/16 1714  TSH 0.802    RADIOLOGY:  Dg Chest Portable 1 View Result Date: 04/20/2016 CLINICAL DATA:  Heart block, shortness of breath EXAM: PORTABLE CHEST 1 VIEW COMPARISON:  04/12/2016 FINDINGS: Cardiomediastinal silhouette is stable. No infiltrate or pleural effusion. No pulmonary edema. Degenerative changes bilateral shoulders again noted. IMPRESSION: No active disease. Electronically Signed   By: Lahoma Crocker M.D.   On: 04/20/2016 11:47    ASSESSMENT AND PLAN:   1. CHB persists this morning     Last dose of BB yesterday AM, there has been no improvement in HR, anticipate PPM this afternoon     BP stable     Creat trending up     Hx of left breast Ca, port was right sided, she did have radiation on L, plan for left sided implant     PPM procedure, risks, benefits were discussed with the patient by Dr.  Laderius Valbuena this morning, she is agreeable to proceed.  2. HTN     Stable, no changes at this time  3. Mod AS by echo in 2013     Echo ordered, pending   Tommye Standard, PA-C 04/21/2016 8:02 AM  I have seen and examined this patient with Tommye Standard.  Agree with above, note added to reflect my findings.  On exam, bradycardic, 2/6 systolic murmur at the base, lung clear. Continued complete AV block. Plan for pacemaker placement. Risks and benefits discussed. Risks include but not limited to bleeding, infection, tamponade, pneumothorax, among others. She understands the risks and has agreed to the procedure. Pearley Baranek obtain a TTE prior to pacemaker placement.    Mckaylin Bastien M. Climmie Cronce MD 04/21/2016 9:53 AM

## 2016-04-21 NOTE — Care Management Note (Signed)
Case Management Note  Patient Details  Name: [name] MRN: [id number] Date of Birth: 06-17-1935  Subjective/Objective:    Adm w complete heart block, for pacermaker insertion                Action/Plan: lives w husband, pcp dr Drema Dallas   Expected Discharge Date:  04/24/16               Expected Discharge Plan:  Home/Self Care  In-House Referral:     Discharge planning Services     Post Acute Care Choice:    Choice offered to:     DME Arranged:    DME Agency:     HH Arranged:    HH Agency:     Status of Service:  In process, will continue to follow  If discussed at Long Length of Stay Meetings, dates discussed:    Additional Comments: will moniter for dc needs.  Lacretia Leigh, RN 04/21/2016, 7:58 AM

## 2016-04-21 NOTE — Care Management Note (Signed)
Case Management Note  Patient Details  Name: [name] MRN: [id number] Date of Birth: 12-02-1935  Subjective/Objective:  S/p pace maker implant, NCM will cont to follow for dc needs.                   Action/Plan:   Expected Discharge Date:  04/24/16               Expected Discharge Plan:  Home/Self Care  In-House Referral:     Discharge planning Services     Post Acute Care Choice:    Choice offered to:     DME Arranged:    DME Agency:     HH Arranged:    HH Agency:     Status of Service:  In process, will continue to follow  If discussed at Long Length of Stay Meetings, dates discussed:    Additional Comments:  [name], RN 04/21/2016, 3:02 PM

## 2016-04-21 NOTE — Progress Notes (Signed)
EKG CRITICAL VALUE     12 lead EKG performed.  Critical value noted. Toni Arthurs, RN notified.   Mindi Akerson H, CCT 04/21/2016 7:07 AM

## 2016-04-21 NOTE — Discharge Instructions (Signed)
° ° °  Supplemental Discharge Instructions for  Pacemaker/Defibrillator Patients  Activity No heavy lifting or vigorous activity with your left/right arm for 6 to 8 weeks.  Do not raise your left/right arm above your head for one week.  Gradually raise your affected arm as drawn below.             04/25/16                     04/26/16                    04/27/16                   04/28/16 __  NO DRIVING for 1 week  ; you may begin driving on [id number]  .  WOUND CARE - Keep the wound area clean and dry.  Do not get this area wet, no showers until seen at your wound care visit  - The tape/steri-strips on your wound will fall off; do not pull them off.  No bandage is needed on the site.  DO  NOT apply any creams, oils, or ointments to the wound area. - If you notice any drainage or discharge from the wound, any swelling or bruising at the site, or you develop a fever > 101? F after you are discharged home, call the office at once.  Special Instructions - You are still able to use cellular telephones; use the ear opposite the side where you have your pacemaker/defibrillator.  Avoid carrying your cellular phone near your device. - When traveling through airports, show security personnel your identification card to avoid being screened in the metal detectors.  Ask the security personnel to use the hand wand. - Avoid arc welding equipment, MRI testing (magnetic resonance imaging), TENS units (transcutaneous nerve stimulators).  Call the office for questions about other devices. - Avoid electrical appliances that are in poor condition or are not properly grounded. - Microwave ovens are safe to be near or to operate.  Additional information for defibrillator patients should your device go off: - If your device goes off ONCE and you feel fine afterward, notify the device clinic nurses. - If your device goes off ONCE and you do not feel well afterward, call 911. - If your device goes off TWICE, call  911. - If your device goes off THREE times in one day, call 911.  DO NOT DRIVE YOURSELF OR A FAMILY MEMBER WITH A DEFIBRILLATOR TO THE HOSPITAL--CALL 911.

## 2016-04-21 NOTE — Progress Notes (Signed)
  Echocardiogram 2D Echocardiogram with Definity has been performed.  [name] 04/21/2016, 11:06 AM

## 2016-04-22 ENCOUNTER — Inpatient Hospital Stay (HOSPITAL_COMMUNITY): Payer: Medicare Other

## 2016-04-22 LAB — BASIC METABOLIC PANEL
ANION GAP: 10 (ref 5–15)
BUN: 29 mg/dL — ABNORMAL HIGH (ref 6–20)
CALCIUM: 9 mg/dL (ref 8.9–10.3)
CHLORIDE: 106 mmol/L (ref 101–111)
CO2: 24 mmol/L (ref 22–32)
Creatinine, Ser: 1.05 mg/dL — ABNORMAL HIGH (ref 0.44–1.00)
GFR calc Af Amer: 57 mL/min — ABNORMAL LOW (ref >60)
GFR calc non Af Amer: 50 mL/min — ABNORMAL LOW (ref >60)
Glucose, Bld: 131 mg/dL — ABNORMAL HIGH (ref 65–99)
Potassium: 4.2 mmol/L (ref 3.5–5.1)
Sodium: 140 mmol/L (ref 135–145)

## 2016-04-22 LAB — GLUCOSE, CAPILLARY: GLUCOSE-CAPILLARY: 129 mg/dL — AB (ref 65–99)

## 2016-04-22 NOTE — Discharge Summary (Signed)
Discharge Summary    Patient ID: [name]  MRN: [id number], DOB/AGE: 79-May-1937 79 y.o.  Admit Date: 04/20/2016 Discharge Date: 04/22/2016  Primary Care Provider: Gerrit Heck, MD Primary Cardiologist: New to [facility] - H&P by [facility]  Discharge Diagnoses    Active Problems:   Complete heart block (HCC)   Symptomatic bradycardia   Allergies Allergies  Allergen Reactions  . Other Rash    CLEAR PLASTIC TAPE     History of Present Illness     81 year old female with history of HTN, DM, ovarian cancer, breast cancer s/p left lumpectomy with port on the right, obesity, arthritis, and spinal stenosis who presented to [facility] 04/20/16 with worsening SOB with exertion, LE edema, and exercise intolerance for the past several weeks prior to her admission. Her PCP had referred her for an echocardiogram. On the morning of 2/15 she was getting ready to go to the office, she had an episode of pre-syncope, diaphoresis, and severe weakness. She presented to the office with complete heart block.   Patient had been on metoprolol tartrate at home for HTN with her last dose prior to admission on the morning of 04/20/16 at 7:30 AM.   Last echo from 2013 showed normal LVEF, moderate AS. Myoview in 2009 low-risk.   Hospital Course     Consultants: none   Patient was admitted on 04/20/16 for newly diagnosed complete heart block. Her beta blocker was held upon admission with planed PPM implantation if no improvement in her conduction. EKG upon admission showed complete heart block with ventricular rate in the 30's bpm. Echo on 04/21/2016 showed EF of 60-65%, no RWMA, GR1DD, moderately to severely calcified aortic annulus with severely thickened and calcified leaflets. There was moderate to severe aortic stenosis with trivial aortic insufficiency. The mitral valve annulus was severely calcified with severely calcified leaflets. Moderate mitral regurgitation. Left atrium was mildly dilated. Off her  beta blocker there was no improvement in her heart rate on 04/21/16. Because of this this she underwent successful implantation of dual-chamber SJM PPM (SN: [id number]) in the afternoon of 04/21/16 on the left side (patient with history of left breast cancer, right-sided port, radiation therapy on the left). She tolerated the procedure [name]. Post-procedure there were no complications and telemetry showed a paced rhythm in the 70's bpm. Post-procedure labs showed an improved renal function and potassium at goal. BP remained well controlled off beta blocker. She may need alternative antihypertensive in follow up if her BP starts to increase. She [name] need outpatient follow up regarding her valvular heart disease.   The patient's implantation site has been examined is healing well without issues at this time. The patient has been seen by Dr. Curt Bears and felt to be stable for discharge today. All follow up appointments have been made. Discharge medications are listed below. Prescriptions have been reviewed with the patient and sent in to their pharmacy as needed.  _____________  Discharge Vitals Blood pressure (!) 139/59, pulse 70, temperature 98.6 F (37 C), temperature source Oral, resp. rate 20, height 5\' 7"  (1.702 m), weight 295 lb 10.2 oz (134.1 kg), SpO2 95 %.  Filed Weights   04/20/16 1627 04/21/16 0500 04/22/16 0410  Weight: (!) 302 lb 0.5 oz (137 kg) 292 lb 1.6 oz (132.5 kg) 295 lb 10.2 oz (134.1 kg)    Labs & Radiologic Studies    CBC  Recent Labs  04/20/16 1126 04/21/16 0336  WBC 8.4 8.3  NEUTROABS 6.0  --  HGB 11.7* 10.9*  HCT 36.0 34.2*  MCV 95.0 95.5  PLT 179 [id number]   Basic Metabolic Panel  Recent Labs  04/20/16 1126 04/21/16 0336 04/22/16 0434  NA 140 141 140  K 4.3 4.7 4.2  CL 106 107 106  CO2 22 24 24   GLUCOSE 163* 124* 131*  BUN 34* 38* 29*  CREATININE 1.49* 1.53* 1.05*  CALCIUM 10.4* 10.0 9.0  MG 1.8  --   --    Liver Function Tests No results for input(s):  AST, ALT, ALKPHOS, BILITOT, PROT, ALBUMIN in the last 72 hours. No results for input(s): LIPASE, AMYLASE in the last 72 hours. Cardiac Enzymes  Recent Labs  04/20/16 1126  TROPONINI <0.03   BNP Invalid input(s): POCBNP D-Dimer No results for input(s): DDIMER in the last 72 hours. Hemoglobin A1C No results for input(s): HGBA1C in the last 72 hours. Fasting Lipid Panel No results for input(s): CHOL, HDL, LDLCALC, TRIG, CHOLHDL, LDLDIRECT in the last 72 hours. Thyroid Function Tests  Recent Labs  04/20/16 1714  TSH 0.802   _____________  Dg Chest 2 View  Result Date: 04/22/2016 CLINICAL DATA:  New pacer placement EXAM: CHEST  2 VIEW COMPARISON:  Two days ago FINDINGS: New dual-chamber pacer from the left with leads overlapping the apical right ventricle and right atrial appendage. Stable cardiopericardial enlargement. No pneumothorax or Kerley lines. Trace pleural fluid. No mediastinal widening. IMPRESSION: New dual-chamber pacer.  No unexpected finding. Electronically Signed   By: Monte Fantasia M.D.   On: 04/22/2016 07:26   Dg Chest 2 View  Result Date: 04/12/2016 CLINICAL DATA:  Shortness of breath. EXAM: CHEST  2 VIEW COMPARISON:  02/12/2008 FINDINGS: Heart is at the upper limits of normal in size. Pulmonary vascularity is at the upper limits of normal. No infiltrates or effusions. No acute bone abnormality. Arthritic changes of both shoulders. IMPRESSION: Borderline cardiomegaly. Electronically Signed   By: Lorriane Shire M.D.   On: 04/12/2016 07:59   Ct Angio Chest Pe W Or Wo Contrast  Result Date: 04/12/2016 CLINICAL DATA:  Shortness of Breath EXAM: CT ANGIOGRAPHY CHEST WITH CONTRAST TECHNIQUE: Multidetector CT imaging of the chest was performed using the standard protocol during bolus administration of intravenous contrast. Multiplanar CT image reconstructions and MIPs were obtained to evaluate the vascular anatomy. CONTRAST:  80 mL Isovue 370. COMPARISON:  04/12/2016,  04/14/2004 FINDINGS: Cardiovascular: Thoracic aorta demonstrates atherosclerotic calcifications without aneurysmal dilatation or dissection. Aortic valve calcifications are seen. Coronary calcifications are noted as are mitral valve calcifications. Pulmonary artery is well visualize without filling defect to suggest pulmonary embolism. Mild cardiac enlargement is noted. No pericardial effusion is seen. Mediastinum/Nodes: Thoracic inlet again demonstrates calcified left thyroid nodules. These are stable from prior exam. The largest of these measures approximately 1 cm. 18 mm short axis lymph node is noted adjacent to the carina best seen on image number are 97 of series 7. Scattered smaller mediastinal lymph nodes are seen. No significant hilar adenopathy is noted. Lungs/Pleura: The lungs are well aerated bilaterally. No focal infiltrate or sizable effusion is seen. No parenchymal nodules are noted. Upper Abdomen: Left renal cystic changes are noted. A small hiatal hernia is noted as well. Musculoskeletal: Degenerative changes of the thoracic spine are noted. Review of the MIP images confirms the above findings. IMPRESSION: No evidence of pulmonary emboli. Prominent lymph node in the right paratracheal/precarinal region as described. This is of uncertain significance as no other significant adenopathy is seen. Short-term follow-up examination may be helpful  to assess for stability or resolution. Electronically Signed   By: Inez Catalina M.D.   On: 04/12/2016 11:15   Dg Chest Portable 1 View  Result Date: 04/20/2016 CLINICAL DATA:  Heart block, shortness of breath EXAM: PORTABLE CHEST 1 VIEW COMPARISON:  04/12/2016 FINDINGS: Cardiomediastinal silhouette is stable. No infiltrate or pleural effusion. No pulmonary edema. Degenerative changes bilateral shoulders again noted. IMPRESSION: No active disease. Electronically Signed   By: Lahoma Crocker M.D.   On: 04/20/2016 11:47    Diagnostic Studies/Procedures   Echo  04/21/2016: Study Conclusions  - Left ventricle: The cavity size was normal. Wall thickness was   increased in a pattern of severe LVH. Systolic function was   normal. The estimated ejection fraction was in the range of 60%   to 65%. Wall motion was normal; there were no regional wall   motion abnormalities. Doppler parameters are consistent with   abnormal left ventricular relaxation (grade 1 diastolic   dysfunction). - Aortic valve: Moderately to severely calcified annulus. Severely   thickened, severely calcified leaflets. There was moderate to   severe stenosis. There was trivial regurgitation. - Mitral valve: Severely calcified annulus. Severely calcified   leaflets . The findings are consistent with moderate stenosis.   There was mild to moderate regurgitation. Valve area by pressure   half-time: 1.86 cm^2. - Left atrium: The atrium was mildly dilated.  SJM PPM implantation 04/21/2016: CONCLUSIONS:   1. Successful implantation of a St Jude Medical [employer]urity MRI  dual-chamber pacemaker for symptomatic bradycardia  2. No early apparent complications.  _____________  Disposition   Pt is being discharged home today in good condition.  Follow-up Plans & Appointments    Follow-up Information    [facility] Follow up on 05/04/2016.   Specialty:  Cardiology Why:  10:00AM, wound check Contact information: [address], [place] [phone]       [facility]s Meredith Leeds, MD Follow up on 07/24/2016.   Specialty:  Cardiology Why:  9:30AM Contact information: [address] [place] [address] [phone]        Skeet Latch, MD Follow up on 05/25/2016.   Specialty:  Cardiology Why:  10:00AM Contact information: [address] [place] [address] [phone]          Discharge Instructions    Diet - low sodium heart healthy    Complete by:  As directed    Increase activity  slowly    Complete by:  As directed       Discharge Medications   Current Discharge Medication List    CONTINUE these medications which have NOT CHANGED   Details  aspirin 81 MG tablet Take 81 mg by mouth daily.      atorvastatin (LIPITOR) 20 MG tablet Take 20 mg by mouth every evening.      Calcium Citrate-Vitamin D (CALCIUM CITRATE + PO) Take by mouth 2 (two) times daily.     Cholecalciferol (VITAMIN D) 2000 UNITS tablet Take 3,000 Units by mouth daily. 2,000 IU in am, and 1,000 IU in the pm.     clobetasol cream (TEMOVATE) [id number] % Apply 1 application topically 2 (two) times daily as needed for rash. irritations    fish oil-omega-3 fatty acids 1000 MG capsule Take 1 g by mouth daily.     FLUoxetine (PROZAC) 40 MG capsule Take 40 mg by mouth every morning.      furosemide (LASIX) 40  MG tablet Take 40 mg by mouth daily.    insulin detemir (LEVEMIR) 100 UNIT/ML injection Inject 70 Units into the skin 2 (two) times daily. Once in a.m. - once in p.m.     Magnesium 400 MG CAPS Take 400 mg by mouth every evening.     meloxicam (MOBIC) 7.5 MG tablet Take 7.5 mg by mouth daily as needed for pain.    metFORMIN (GLUCOPHAGE) 1000 MG tablet Take 1,000 mg by mouth 2 (two) times daily with a meal.     Multiple Vitamin (MULTIVITAMIN) tablet Take 1 tablet by mouth daily.      olmesartan (BENICAR) 20 MG tablet Take 10 mg by mouth every evening.      STOP taking these medications     metoprolol (LOPRESSOR) 50 MG tablet          Aspirin prescribed at discharge? Yes High Intensity Statin Prescribed? (Lipitor 40-80mg  or Crestor 20-40mg ): Yes Beta Blocker Prescribed? No: CHB For EF <40%, was ACEI/ARB Prescribed? No: EF > 40% ADP Receptor Inhibitor Prescribed? (i.e. Plavix etc.-Includes Medically Managed Patients): No:  For EF <40%, Aldosterone Inhibitor Prescribed? No: EF > 40% Was EF assessed during THIS hospitalization? Yes Was Cardiac Rehab II ordered? (Included Medically managed  Patients): No   Outstanding Labs/Studies   none  Duration of Discharge Encounter   Greater than 30 minutes including physician time.  Signed, Rise Mu, PA-C [place] Pager: [phone] 04/22/2016, 9:17 AM  I have seen and examined this patient with Christell Faith.  Agree with above, note added to reflect my findings.  On exam, regular rhythm, 2/6 systolic murmur at the base, lungs clear.   Presented to the hospital with fatigue and shortness of breath found to be in complete AV block. TTE showed normal EF with moderate to severe aortic stenosis. Her metoprolol was held for possibly 30 hours, but the AV block continued. She had a St. Jude dual-chamber pacemaker placed without issue. Chest x-ray without pneumothorax, and interrogation shows stable leads. Plan for discharge today.  Marylee Belzer M. Jadence Kinlaw MD 04/22/2016 10:22 AM

## 2016-05-04 ENCOUNTER — Ambulatory Visit (INDEPENDENT_AMBULATORY_CARE_PROVIDER_SITE_OTHER): Payer: Medicare Other | Admitting: *Deleted

## 2016-05-04 DIAGNOSIS — I442 Atrioventricular block, complete: Secondary | ICD-10-CM

## 2016-05-04 LAB — CUP PACEART INCLINIC DEVICE CHECK
Battery Voltage: 3.1 V
Brady Statistic RA Percent Paced: 0.66 %
Brady Statistic RV Percent Paced: 44 %
Implantable Lead Implant Date: 20180216
Implantable Pulse Generator Implant Date: 20180216
Lead Channel Impedance Value: 450 Ohm
Lead Channel Impedance Value: 537.5 Ohm
Lead Channel Pacing Threshold Amplitude: 0.5 V
Lead Channel Pacing Threshold Pulse Width: 0.5 ms
Lead Channel Sensing Intrinsic Amplitude: 5 mV
MDC IDC LEAD IMPLANT DT: 20180216
MDC IDC LEAD LOCATION: 753859
MDC IDC LEAD LOCATION: 753860
MDC IDC MSMT LEADCHNL RV PACING THRESHOLD AMPLITUDE: 0.75 V
MDC IDC MSMT LEADCHNL RV PACING THRESHOLD PULSEWIDTH: 0.5 ms
MDC IDC MSMT LEADCHNL RV SENSING INTR AMPL: 7.6 mV
MDC IDC SESS DTM: 20180301101210
MDC IDC SET LEADCHNL RA PACING AMPLITUDE: 3.5 V
MDC IDC SET LEADCHNL RV PACING AMPLITUDE: 0.875
MDC IDC SET LEADCHNL RV PACING PULSEWIDTH: 0.5 ms
MDC IDC SET LEADCHNL RV SENSING SENSITIVITY: 2.5 mV
Pulse Gen Model: 2272
Pulse Gen Serial Number: 7998131

## 2016-05-04 NOTE — Progress Notes (Signed)
Wound check appointment. Steri-strips removed. Wound without redness or edema. Incision edges approximated, wound well healed. Normal device function. Thresholds, sensing, and impedances consistent with implant measurements. Device programmed at 3.5V RA, RV auto capture programmed on for extra safety margin until 3 month visit. Histogram distribution appropriate for patient and level of activity. 11 mode switches (<1%), Max duration 2 minutes, EGM appears AT. No high ventricular rates noted. Patient educated about wound care, arm mobility, lifting restrictions. ROV in 3 months with WC.

## 2016-05-08 ENCOUNTER — Ambulatory Visit: Payer: Medicare Other

## 2016-05-19 ENCOUNTER — Telehealth: Payer: Self-pay | Admitting: Cardiology

## 2016-05-19 NOTE — Telephone Encounter (Signed)
Patient had procedure for pacemaker implant on 04/21/16 with Dr. Curt Bears, will forward to him for advisement.

## 2016-05-19 NOTE — Telephone Encounter (Signed)
New Message:    Does pt need pre medication before her cleaning? Please let us know if she does or does not need it.Please fax to [phone]

## 2016-05-22 ENCOUNTER — Encounter: Payer: Self-pay | Admitting: *Deleted

## 2016-05-22 NOTE — Telephone Encounter (Addendum)
Verified w/ patient she has not had any valve repair/replacement surgery. Informed dental office that pt should wait 6 wks or longer after PPM implant for routine cleaning and that pt does not require pre medication prior to dental procedures. Will fax note to their office stating so - per their request. Faxed to [phone]

## 2016-05-25 ENCOUNTER — Encounter: Payer: Self-pay | Admitting: Cardiovascular Disease

## 2016-05-25 ENCOUNTER — Ambulatory Visit (INDEPENDENT_AMBULATORY_CARE_PROVIDER_SITE_OTHER): Payer: Medicare Other | Admitting: Cardiovascular Disease

## 2016-05-25 VITALS — BP 135/74 | HR 89 | Ht 67.0 in | Wt 281.0 lb

## 2016-05-25 DIAGNOSIS — I050 Rheumatic mitral stenosis: Secondary | ICD-10-CM | POA: Diagnosis not present

## 2016-05-25 DIAGNOSIS — I442 Atrioventricular block, complete: Secondary | ICD-10-CM | POA: Diagnosis not present

## 2016-05-25 DIAGNOSIS — I350 Nonrheumatic aortic (valve) stenosis: Secondary | ICD-10-CM

## 2016-05-25 DIAGNOSIS — R001 Bradycardia, unspecified: Secondary | ICD-10-CM | POA: Diagnosis not present

## 2016-05-25 DIAGNOSIS — I10 Essential (primary) hypertension: Secondary | ICD-10-CM

## 2016-05-25 MED ORDER — FUROSEMIDE 40 MG PO TABS: 40.0000 mg | ORAL_TABLET | Freq: Two times a day (BID) | ORAL | 1 refills | Status: DC

## 2016-05-25 NOTE — Patient Instructions (Signed)
Medication Instructions:  INCREASE YOUR FUROSEMIDE TO 40 MG TWICE A DY   Labwork: NONE  Testing/Procedures: NONE  Follow-Up: Your physician recommends that you schedule a follow-up appointment in: [place]  If you need a refill on your cardiac medications before your next appointment, please call your pharmacy.

## 2016-05-25 NOTE — Progress Notes (Signed)
Cardiology Office Note   Date:  05/25/2016   ID:  [name], DOB Feb 27, 1936, MRN [id number]  PCP:  Gerrit Heck, MD  Cardiologist:   Skeet Latch, MD   Chief Complaint  Patient presents with  . Follow-up    post hospital       History of Present Illness: [name] is a 81 y.o. female with hypertension, diabetes, prior PE, moderate aortic stenosis, complete heart block status post pacemaker, breast cancer status post left lumpectomy, and morbid obesity who presents for follow-up.  [name] was admitted to the hospital 04/2016 with symptomatic bradycardia. She was noted to be in complete heart block with a ventricular rate in the 30s.[name] Kitchen She was on a beta blocker which was initially held. However her heart rate did not improve significantly. Dr. Curt Bears implanted a St. Jude dual-chamber pacemaker implanted 04/21/16.She had an echocardiogram 04/21/16 that revealed LVEF 60-65% with grade 1 diastolic dysfunction. She also had moderate aortic stenosis with a mean gradient of 33 mmHg. She was noted to have moderate mitral stenosis due to mitral annular calcification as well. The mean gradient 7 mmHg with a valve area by pressure half-time of 1.86 cm.  BNP was 612.    Since leaving the hospital [name] has been well.  She Still has mild soreness at the pacemaker site.  She has experienced lower extremity edema for years in the left leg more so than the right. She previously had a DVT in that leg. She also endorses orthopnea, though this is also complicated by her back pain from spinal stenosis.  She has mild exertional dyspnea. She denies chest pain or pressure.  Her energy has been much better since having the pacemaker implanted and she hasn't noted any dizziness.   Past Medical History:  Diagnosis Date  . Aortic stenosis, moderate 2013  . Arthritis   . Cancer [facility])    CANCER  . Diabetes mellitus   . Hypertension   . Morbid obesity (HCC)    BMI > 40  . Ovarian  cancer ([place])   . Pulmonary embolism ([place]) 2013   post op  . Spinal stenosis     Past Surgical History:  Procedure Laterality Date  . ABDOMINAL HYSTERECTOMY  2013  . BREAST SURGERY  03-2004   lumpectomy   . PACEMAKER IMPLANT N/A 04/21/2016   Procedure: Pacemaker Implant;  Surgeon: Will Meredith Leeds, MD;  Location: [place] [facility];  Service: Cardiovascular;  Laterality: N/A;     Current Outpatient Prescriptions  Medication Sig Dispense Refill  . aspirin 81 MG tablet Take 81 mg by mouth daily.      [name] Kitchen atorvastatin (LIPITOR) 20 MG tablet Take 20 mg by mouth every evening.      . Calcium Citrate-Vitamin D (CALCIUM CITRATE + PO) Take by mouth 2 (two) times daily.     . Cholecalciferol (VITAMIN D) 2000 UNITS tablet Take 3,000 Units by mouth daily. 2,000 IU in am, and 1,000 IU in the pm.     . clobetasol cream (TEMOVATE) 7.25 % Apply 1 application topically 2 (two) times daily as needed for rash. irritations    . fish oil-omega-3 fatty acids 1000 MG capsule Take 1 g by mouth daily.     [name] Kitchen FLUoxetine (PROZAC) 40 MG capsule Take 40 mg by mouth every morning.      . furosemide (LASIX) 40 MG tablet Take 1 tablet (40 mg total) by mouth 2 (two) times daily. 180 tablet 1  .  LEVEMIR FLEXTOUCH 100 UNIT/ML Pen     . Magnesium 400 MG CAPS Take 400 mg by mouth every evening.     . meloxicam (MOBIC) 7.5 MG tablet Take 7.5 mg by mouth daily as needed for pain.    . metFORMIN (GLUCOPHAGE) 1000 MG tablet Take 1,000 mg by mouth 2 (two) times daily with a meal.     . Multiple Vitamin (MULTIVITAMIN) tablet Take 1 tablet by mouth daily.      [name] Kitchen olmesartan (BENICAR) 20 MG tablet Take 10 mg by mouth every evening.     No current facility-administered medications for this visit.     Allergies:   Other    Social History:  The patient  reports that she has quit smoking. She has never used smokeless tobacco. She reports that she does not drink alcohol or use drugs.   Family History:  The patient's family  history includes Cancer in her brother, father, and sister.    ROS:  Please see the history of present illness.   Otherwise, review of systems are positive for none.   All other systems are reviewed and negative.    PHYSICAL EXAM: VS:  BP 135/74   Pulse 89   Ht 5\' 7"  (1.702 m)   Wt 127.5 kg (281 lb)   SpO2 92%   BMI 44.01 kg/m  , BMI Body mass index is 44.01 kg/m. GENERAL:  Well appearing HEENT:  Pupils equal round and reactive, fundi not visualized, oral mucosa unremarkable NECK:  Difficult to assess JVD,  waveform within normal limits, carotid upstroke brisk and symmetric, no bruits, no thyromegaly LYMPHATICS:  No cervical adenopathy LUNGS:  Clear to auscultation bilaterally HEART:  RRR.  PMI not displaced or sustained,S1 and S2 within normal limits, no S3, no S4, no clicks, no rubs, no murmurs ABD:  Flat, positive bowel sounds normal in frequency in pitch, no bruits, no rebound, no guarding, no midline pulsatile mass, no hepatomegaly, no splenomegaly EXT:  2 plus pulses throughout, no edema, no cyanosis no clubbing SKIN:  No rashes no nodules NEURO:  Cranial nerves II through XII grossly intact, motor grossly intact throughout PSYCH:  Cognitively intact, oriented to person place and time   EKG:  EKG is not ordered today.   Echo 04/21/16: Study Conclusions  - Left ventricle: The cavity size was normal. Wall thickness was   increased in a pattern of severe LVH. Systolic function was   normal. The estimated ejection fraction was in the range of 60%   to 65%. Wall motion was normal; there were no regional wall   motion abnormalities. Doppler parameters are consistent with   abnormal left ventricular relaxation (grade 1 diastolic   dysfunction). - Aortic valve: Moderately to severely calcified annulus. Severely   thickened, severely calcified leaflets. There was moderate to   severe stenosis. There was trivial regurgitation. - Mitral valve: Severely calcified annulus.  Severely calcified   leaflets . The findings are consistent with moderate stenosis.   There was mild to moderate regurgitation. Valve area by pressure   half-time: 1.86 cm^2. - Left atrium: The atrium was mildly dilated.   Recent Labs: 04/12/2016: ALT 23 04/20/2016: B Natriuretic Peptide 612.7; Magnesium 1.8; TSH 0.802 04/21/2016: Hemoglobin 10.9; Platelets 183 04/22/2016: BUN 29; Creatinine, Ser 1.05; Potassium 4.2; Sodium 140    Lipid Panel No results found for: CHOL, TRIG, HDL, CHOLHDL, VLDL, LDLCALC, LDLDIRECT    Wt Readings from Last 3 Encounters:  05/25/16 127.5 kg (281 lb)  04/22/16  134.1 kg (295 lb 10.2 oz)  04/20/16 124.7 kg (275 lb)      ASSESSMENT AND PLAN:  # Complete heart block s/p PPM: [name] is doing well.  Managed by Dr. Curt Bears.   # Chronic diastolic heart failure:  # Moderate aortic stenosis: # Moderate mitral stenosis: [name] is volume overloaded.  BNP was elevated in the hospital.  We will increase lasix to 40 mg bid.  Repeat echo in 04/2017.  # Hypertension: Blood pressure slightly above goal. Increasing Lasix as above.  Continue olmesartan.   # Hyperlipidemia:  Continue atorvastatin. His recently checked by her PCP.    Current medicines are reviewed at length with the patient today.  The patient does not have concerns regarding medicines.  The following changes have been made:  no change  Labs/ tests ordered today include:  No orders of the defined types were placed in this encounter.    Disposition:   FU with [name] C. Oval Linsey, MD, [facility] in 1 month.    This note was written with the assistance of speech recognition software.  Please excuse any transcriptional errors.  Signed, Jacky Dross C. Oval Linsey, MD, [facility]  05/25/2016 4:41 PM    [facility]

## 2016-06-28 ENCOUNTER — Encounter: Payer: Self-pay | Admitting: Cardiovascular Disease

## 2016-06-28 ENCOUNTER — Ambulatory Visit (INDEPENDENT_AMBULATORY_CARE_PROVIDER_SITE_OTHER): Payer: Medicare Other | Admitting: Cardiovascular Disease

## 2016-06-28 VITALS — BP 117/61 | HR 74 | Ht 67.5 in | Wt 274.8 lb

## 2016-06-28 DIAGNOSIS — Z79899 Other long term (current) drug therapy: Secondary | ICD-10-CM | POA: Diagnosis not present

## 2016-06-28 DIAGNOSIS — I442 Atrioventricular block, complete: Secondary | ICD-10-CM

## 2016-06-28 DIAGNOSIS — I10 Essential (primary) hypertension: Secondary | ICD-10-CM

## 2016-06-28 DIAGNOSIS — R0602 Shortness of breath: Secondary | ICD-10-CM

## 2016-06-28 LAB — BASIC METABOLIC PANEL
BUN: 26 mg/dL — ABNORMAL HIGH (ref 7–25)
CALCIUM: 10.1 mg/dL (ref 8.6–10.4)
CO2: 25 mmol/L (ref 20–31)
Chloride: 101 mmol/L (ref 98–110)
Creat: 1.23 mg/dL — ABNORMAL HIGH (ref 0.60–0.93)
GLUCOSE: 102 mg/dL — AB (ref 65–99)
Potassium: 4.6 mmol/L (ref 3.5–5.3)
Sodium: 141 mmol/L (ref 135–146)

## 2016-06-28 NOTE — Patient Instructions (Signed)
Medication Instructions:  Your physician recommends that you continue on your current medications as directed. Please refer to the Current Medication list given to you today.  Labwork: BMET AT [facility] ON THE FIRST FLOOR  Testing/Procedures: NONE  Follow-Up: Your physician recommends that you schedule a follow-up appointment in: [place]  If you need a refill on your cardiac medications before your next appointment, please call your pharmacy.

## 2016-06-28 NOTE — Progress Notes (Signed)
Cardiology Office Note   Date:  06/30/2016   ID:  [name], DOB May 03, 1935, MRN [id number]  PCP:  Gerrit Heck, MD  Cardiologist:   Skeet Latch, MD   Chief Complaint  Patient presents with  . Follow-up    1 month;  [name] Kitchen Shortness of Breath    when exerting self.  . Edema    ankle and feet; but  is decreasing.       History of Present Illness: [name] is a 81 y.o. female with hypertension, diabetes, prior DVT/PE, moderate aortic stenosis, complete heart block status post pacemaker, breast cancer status post left lumpectomy, and morbid obesity who presents for follow-up.  [name] was admitted to the hospital 04/2016 with symptomatic bradycardia. She was noted to be in complete heart block with a ventricular rate in the 30s. Dr. Curt Bears implanted a St. Jude dual-chamber pacemaker implanted 04/21/16.She had an echocardiogram 04/21/16 that revealed LVEF 60-65% with grade 1 diastolic dysfunction. She also had moderate aortic stenosis with a mean gradient of 33 mmHg. She was noted to have moderate mitral stenosis due to mitral annular calcification as well. The mean gradient 7 mmHg with a valve area by pressure half-time of 1.86 cm.  BNP was 612.    At her last appointment [name] was noted to be volume overloaded and lasix was increased to 40 mg bid.  Since then she notices a significant loss in weight. She is down 7 pounds. Her legs feel better and her breathing is better. She continues to have mild orthopnea but denies PND. She has not experienced any chest pain, lightheadedness, or dizziness. She has been stressed lately because her husband is currently in the hospital. He had an oral cancer that required excision of part of his tongue and a salivary gland.   Past Medical History:  Diagnosis Date  . Aortic stenosis, moderate 2013  . Arthritis   . Cancer [facility])    CANCER  . Diabetes mellitus   . Hypertension   . Morbid obesity (HCC)    BMI > 40  .  Ovarian cancer ([place])   . Pulmonary embolism ([place]) 2013   post op  . Spinal stenosis     Past Surgical History:  Procedure Laterality Date  . ABDOMINAL HYSTERECTOMY  2013  . BREAST SURGERY  03-2004   lumpectomy   . PACEMAKER IMPLANT N/A 04/21/2016   Procedure: Pacemaker Implant;  Surgeon: Will Meredith Leeds, MD;  Location: [place] [facility];  Service: Cardiovascular;  Laterality: N/A;     Current Outpatient Prescriptions  Medication Sig Dispense Refill  . aspirin 81 MG tablet Take 81 mg by mouth daily.      [name] Kitchen atorvastatin (LIPITOR) 20 MG tablet Take 20 mg by mouth every evening.      . Calcium Citrate-Vitamin D (CALCIUM CITRATE + PO) Take by mouth 2 (two) times daily.     . Cholecalciferol (VITAMIN D) 2000 UNITS tablet Take 3,000 Units by mouth daily. 2,000 IU in am, and 1,000 IU in the pm.     . clobetasol cream (TEMOVATE) 7.59 % Apply 1 application topically 2 (two) times daily as needed for rash. irritations    . fish oil-omega-3 fatty acids 1000 MG capsule Take 1 g by mouth daily.     [name] Kitchen FLUoxetine (PROZAC) 40 MG capsule Take 40 mg by mouth every morning.      . furosemide (LASIX) 40 MG tablet Take 1 tablet (40 mg total) by  mouth 2 (two) times daily. 180 tablet 1  . LEVEMIR FLEXTOUCH 100 UNIT/ML Pen     . Magnesium 400 MG CAPS Take 400 mg by mouth every evening.     . meloxicam (MOBIC) 7.5 MG tablet Take 7.5 mg by mouth daily as needed for pain.    . metFORMIN (GLUCOPHAGE) 1000 MG tablet Take 1,000 mg by mouth 2 (two) times daily with a meal.     . Multiple Vitamin (MULTIVITAMIN) tablet Take 1 tablet by mouth daily.      [name] Kitchen olmesartan (BENICAR) 20 MG tablet Take 10 mg by mouth every evening.     No current facility-administered medications for this visit.     Allergies:   Other    Social History:  The patient  reports that she has quit smoking. She has never used smokeless tobacco. She reports that she does not drink alcohol or use drugs.   Family History:  The patient's  family history includes Cancer in her brother, father, and sister.    ROS:  Please see the history of present illness.   Otherwise, review of systems are positive for none.   All other systems are reviewed and negative.    PHYSICAL EXAM: VS:  BP 117/61   Pulse 74   Ht 5' 7.5" (1.715 m)   Wt 124.6 kg (274 lb 12.8 oz)   BMI 42.40 kg/m  , BMI Body mass index is 42.4 kg/m. GENERAL:  Well appearing HEENT:  Pupils equal round and reactive, fundi not visualized, oral mucosa unremarkable NECK:  Difficult to assess JVD,  waveform within normal limits, carotid upstroke brisk and symmetric, no bruits, no thyromegaly LYMPHATICS:  No cervical adenopathy LUNGS:  Clear to auscultation bilaterally HEART:  RRR.  PMI not displaced or sustained,S1 and S2 within normal limits, no S3, no S4, no clicks, no rubs, no murmurs ABD:  Flat, positive bowel sounds normal in frequency in pitch, no bruits, no rebound, no guarding, no midline pulsatile mass, no hepatomegaly, no splenomegaly EXT:  2 plus pulses throughout, no edema, no cyanosis no clubbing SKIN:  No rashes no nodules NEURO:  Cranial nerves II through XII grossly intact, motor grossly intact throughout PSYCH:  Cognitively intact, oriented to person place and time   EKG:  EKG is not ordered today.   Echo 04/21/16: Study Conclusions  - Left ventricle: The cavity size was normal. Wall thickness was   increased in a pattern of severe LVH. Systolic function was   normal. The estimated ejection fraction was in the range of 60%   to 65%. Wall motion was normal; there were no regional wall   motion abnormalities. Doppler parameters are consistent with   abnormal left ventricular relaxation (grade 1 diastolic   dysfunction). - Aortic valve: Moderately to severely calcified annulus. Severely   thickened, severely calcified leaflets. There was moderate to   severe stenosis. There was trivial regurgitation. - Mitral valve: Severely calcified annulus.  Severely calcified   leaflets . The findings are consistent with moderate stenosis.   There was mild to moderate regurgitation. Valve area by pressure   half-time: 1.86 cm^2. - Left atrium: The atrium was mildly dilated.   Recent Labs: 04/12/2016: ALT 23 04/20/2016: B Natriuretic Peptide 612.7; Magnesium 1.8; TSH 0.802 04/21/2016: Hemoglobin 10.9; Platelets 183 06/28/2016: BUN 26; Creat 1.23; Potassium 4.6; Sodium 141    Lipid Panel No results found for: CHOL, TRIG, HDL, CHOLHDL, VLDL, LDLCALC, LDLDIRECT    Wt Readings from Last 3 Encounters:  06/28/16 124.6 kg (274 lb 12.8 oz)  05/25/16 127.5 kg (281 lb)  04/22/16 134.1 kg (295 lb 10.2 oz)      ASSESSMENT AND PLAN:  # Complete heart block s/p PPM: [name] is doing well.  Managed by Dr. Curt Bears.   # Chronic diastolic heart failure:  # Moderate aortic stenosis: # Moderate mitral stenosis: Volume status has improved significantly with increased lasix.  We will check a BMP.  Repeat echo in 04/2017.  We discussed limiting fluids to 1.5L daily and salt to 2g.   # Hypertension: Blood pressure well-controlled.  Continue olmesartan.   # Hyperlipidemia:  Continue atorvastatin. Labs recently checked by her PCP.    Current medicines are reviewed at length with the patient today.  The patient does not have concerns regarding medicines.  The following changes have been made:  no change  Labs/ tests ordered today include:   Orders Placed This Encounter  Procedures  . Basic metabolic panel     Disposition:   FU with Skeeter Sheard C. Oval Linsey, MD, [facility] in 3 months.    This note was written with the assistance of speech recognition software.  Please excuse any transcriptional errors.  Signed, Lorik Guo C. Oval Linsey, MD, [facility]  06/30/2016 9:51 PM    [facility]

## 2016-07-24 ENCOUNTER — Encounter: Payer: Self-pay | Admitting: Cardiology

## 2016-07-24 ENCOUNTER — Ambulatory Visit (INDEPENDENT_AMBULATORY_CARE_PROVIDER_SITE_OTHER): Payer: Medicare Other | Admitting: Cardiology

## 2016-07-24 VITALS — BP 112/68 | HR 83 | Ht 67.5 in | Wt 276.4 lb

## 2016-07-24 DIAGNOSIS — I442 Atrioventricular block, complete: Secondary | ICD-10-CM

## 2016-07-24 DIAGNOSIS — Z950 Presence of cardiac pacemaker: Secondary | ICD-10-CM

## 2016-07-24 LAB — CUP PACEART INCLINIC DEVICE CHECK
Battery Voltage: 3.02 V
Brady Statistic RA Percent Paced: 0.27 %
Implantable Lead Implant Date: 20180216
Implantable Lead Location: 753859
Implantable Lead Location: 753860
Lead Channel Impedance Value: 425 Ohm
Lead Channel Pacing Threshold Amplitude: 0.5 V
Lead Channel Pacing Threshold Amplitude: 0.75 V
Lead Channel Pacing Threshold Pulse Width: 0.5 ms
Lead Channel Setting Pacing Amplitude: 0.875
Lead Channel Setting Pacing Amplitude: 2 V
Lead Channel Setting Pacing Pulse Width: 0.5 ms
MDC IDC LEAD IMPLANT DT: 20180216
MDC IDC MSMT LEADCHNL RA SENSING INTR AMPL: 4 mV
MDC IDC MSMT LEADCHNL RV IMPEDANCE VALUE: 487.5 Ohm
MDC IDC MSMT LEADCHNL RV PACING THRESHOLD PULSEWIDTH: 0.5 ms
MDC IDC MSMT LEADCHNL RV SENSING INTR AMPL: 9.1 mV
MDC IDC PG IMPLANT DT: 20180216
MDC IDC SESS DTM: 20180521101231
MDC IDC SET LEADCHNL RV SENSING SENSITIVITY: 2.5 mV
MDC IDC STAT BRADY RV PERCENT PACED: 12 %
Pulse Gen Serial Number: 7998131

## 2016-07-24 NOTE — Progress Notes (Signed)
Electrophysiology Office Note   Date:  07/24/2016   ID:  [name], [name] 21-Feb-1936, MRN [id number]  PCP:  Leighton Ruff, MD  Primary Electrophysiologist:  Charly Hunton Meredith Leeds, MD    Chief Complaint  Patient presents with  . Pacemaker Check    Complete heart block     History of Present Illness: [name] is a 81 y.o. female who is being seen today for the evaluation of heart block at the request of Leighton Ruff, MD. Presenting today for electrophysiology evaluation. She has history of diabetes, hypertension, ovarian cancer, breast cancer status post left lumpectomy with port on the right, obesity, arthritis, and spinal stenosis. She presented to [facility] on 04/20/16 with worsening shortness of breath lower extremity edema and exercise intolerance. She was found to be in complete heart block. She was admitted to the hospital and home metoprolol was held. Heart block continued and she had a St. Jude dual-chamber pacemaker placed on 04/21/16. She tolerated the procedure well.    Today, she denies symptoms of palpitations, chest pain, shortness of breath, orthopnea, PND, lower extremity edema, claudication, dizziness, presyncope, syncope, bleeding, or neurologic sequela. The patient is tolerating medications without difficulties.    Past Medical History:  Diagnosis Date  . Aortic stenosis, moderate 2013  . Arthritis   . Cancer [facility])    CANCER  . Diabetes mellitus   . Hypertension   . Morbid obesity (HCC)    BMI > 40  . Ovarian cancer ([place])   . Pulmonary embolism ([place]) 2013   post op  . Spinal stenosis    Past Surgical History:  Procedure Laterality Date  . ABDOMINAL HYSTERECTOMY  2013  . BREAST SURGERY  03-2004   lumpectomy   . PACEMAKER IMPLANT N/A 04/21/2016   Procedure: Pacemaker Implant;  Surgeon: Raechelle Sarti Meredith Leeds, MD;  Location: [place] [facility];  Service: Cardiovascular;  Laterality: N/A;     Current Outpatient Prescriptions  Medication Sig  Dispense Refill  . aspirin 81 MG tablet Take 81 mg by mouth daily.      [name] Kitchen atorvastatin (LIPITOR) 20 MG tablet Take 20 mg by mouth every evening.      . Calcium Citrate-Vitamin D (CALCIUM CITRATE + PO) Take by mouth 2 (two) times daily.     . Cholecalciferol (VITAMIN D) 2000 UNITS tablet Take 3,000 Units by mouth daily. 2,000 IU in am, and 1,000 IU in the pm.     . clobetasol cream (TEMOVATE) 4.12 % Apply 1 application topically 2 (two) times daily as needed for rash. irritations    . fish oil-omega-3 fatty acids 1000 MG capsule Take 1 g by mouth daily.     [name] Kitchen FLUoxetine (PROZAC) 40 MG capsule Take 40 mg by mouth every morning.      . furosemide (LASIX) 40 MG tablet Take 1 tablet (40 mg total) by mouth 2 (two) times daily. 180 tablet 1  . LEVEMIR FLEXTOUCH 100 UNIT/ML Pen     . Magnesium 400 MG CAPS Take 400 mg by mouth every evening.     . meloxicam (MOBIC) 7.5 MG tablet Take 7.5 mg by mouth daily as needed for pain.    . metFORMIN (GLUCOPHAGE) 1000 MG tablet Take 1,000 mg by mouth 2 (two) times daily with a meal.     . Multiple Vitamin (MULTIVITAMIN) tablet Take 1 tablet by mouth daily.      [name] Kitchen olmesartan (BENICAR) 20 MG tablet Take 10 mg by mouth every evening.  No current facility-administered medications for this visit.     Allergies:   Other   Social History:  The patient  reports that she has quit smoking. She has never used smokeless tobacco. She reports that she does not drink alcohol or use drugs.   Family History:  The patient's family history includes Cancer in her brother, father, and sister.    ROS:  Please see the history of present illness.   Otherwise, review of systems is positive for back pain.   All other systems are reviewed and negative.    PHYSICAL EXAM: VS:  BP 112/68   Pulse 83   Ht 5' 7.5" (1.715 m)   Wt 276 lb 6.4 oz (125.4 kg)   BMI 42.65 kg/m  , BMI Body mass index is 42.65 kg/m. GEN: Well nourished, well developed, in no acute distress  HEENT:  normal  Neck: no JVD, carotid bruits, or masses Cardiac: RRR; no murmurs, rubs, or gallops,no edema  Respiratory:  clear to auscultation bilaterally, normal work of breathing GI: soft, nontender, nondistended, + BS MS: no deformity or atrophy  Skin: warm and dry,  device pocket is well healed Neuro:  Strength and sensation are intact Psych: euthymic mood, full affect  EKG:  EKG is ordered today. Personal review of the ekg ordered shows sinus rhythm, RBBB, inferior TWI   Device interrogation is reviewed today in detail.  See PaceArt for details.   Recent Labs: 04/12/2016: ALT 23 04/20/2016: B Natriuretic Peptide 612.7; Magnesium 1.8; TSH 0.802 04/21/2016: Hemoglobin 10.9; Platelets 183 06/28/2016: BUN 26; Creat 1.23; Potassium 4.6; Sodium 141    Lipid Panel  No results found for: CHOL, TRIG, HDL, CHOLHDL, VLDL, LDLCALC, LDLDIRECT   Wt Readings from Last 3 Encounters:  07/24/16 276 lb 6.4 oz (125.4 kg)  06/28/16 274 lb 12.8 oz (124.6 kg)  05/25/16 281 lb (127.5 kg)      Other studies Reviewed: Additional studies/ records that were reviewed today include: TTE 04/21/16, hospital records  Review of the above records today demonstrates:  - Left ventricle: The cavity size was normal. Wall thickness was   increased in a pattern of severe LVH. Systolic function was   normal. The estimated ejection fraction was in the range of 60%   to 65%. Wall motion was normal; there were no regional wall   motion abnormalities. Doppler parameters are consistent with   abnormal left ventricular relaxation (grade 1 diastolic   dysfunction). - Aortic valve: Moderately to severely calcified annulus. Severely   thickened, severely calcified leaflets. There was moderate to   severe stenosis. There was trivial regurgitation. - Mitral valve: Severely calcified annulus. Severely calcified   leaflets . The findings are consistent with moderate stenosis.   There was mild to moderate regurgitation. Valve  area by pressure   half-time: 1.86 cm^2. - Left atrium: The atrium was mildly dilated.   ASSESSMENT AND PLAN:  1.  Complete AV block: St. Jude dual-chamber pacemaker placed 04/23/16. Patient without major complaints. Device interrogation shows normal function. Device adjusted for long-term management.  2. Hypertension: Well-controlled today. No changes necessary.  3. Moderate aortic stenosis: Currently asymptomatic. No intervention necessary at this time.  4. Atrial tachycardia: Seen incidentally on device interrogation. That was around the time of a very stressful point in her life during time of her husbands surgery. With asymptomatic. Continue current monitoring.  Current medicines are reviewed at length with the patient today.   The patient does not have concerns regarding her medicines.  The following changes were made today:  none  Labs/ tests ordered today include:  Orders Placed This Encounter  Procedures  . EKG 12-Lead     Disposition:   FU with Jessamy Torosyan 9 months  Signed, Osie Merkin Meredith Leeds, MD  07/24/2016 9:47 AM     [facility] [address] [place] [address] [phone] (office) [phone] (fax)

## 2016-07-24 NOTE — Patient Instructions (Addendum)
Medication Instructions:    Your physician recommends that you continue on your current medications as directed. Please refer to the Current Medication list given to you today.  --- If you need a refill on your cardiac medications before your next appointment, please call your pharmacy. ---  Labwork:  None ordered  Testing/Procedures:  None ordered  Follow-Up: Remote monitoring is used to monitor your Pacemaker of ICD from home. This monitoring reduces the number of office visits required to check your device to one time per year. It allows [place] to keep an eye on the functioning of your device to ensure it is working properly. You are scheduled for a device check from home on 10/23/2016. You may send your transmission at any time that day. If you have a wireless device, the transmission will be sent automatically. After your physician reviews your transmission, you will receive a postcard with your next transmission date.   Your physician wants you to follow-up in: 9 months with Dr. Curt Bears.  You will receive a reminder letter in the mail two months in advance. If you don't receive a letter, please call our office to schedule the follow-up appointment.  Thank you for choosing [facility]!!   Trinidad Curet, RN [phone]

## 2016-08-03 DIAGNOSIS — Z87891 Personal history of nicotine dependence: Secondary | ICD-10-CM | POA: Diagnosis not present

## 2016-08-03 DIAGNOSIS — E669 Obesity, unspecified: Secondary | ICD-10-CM | POA: Diagnosis not present

## 2016-08-03 DIAGNOSIS — Z6841 Body Mass Index (BMI) 40.0 and over, adult: Secondary | ICD-10-CM | POA: Diagnosis not present

## 2016-08-03 DIAGNOSIS — E119 Type 2 diabetes mellitus without complications: Secondary | ICD-10-CM | POA: Diagnosis not present

## 2016-08-03 DIAGNOSIS — Z853 Personal history of malignant neoplasm of breast: Secondary | ICD-10-CM | POA: Diagnosis not present

## 2016-08-03 DIAGNOSIS — Z9079 Acquired absence of other genital organ(s): Secondary | ICD-10-CM | POA: Diagnosis not present

## 2016-08-03 DIAGNOSIS — Z806 Family history of leukemia: Secondary | ICD-10-CM | POA: Diagnosis not present

## 2016-08-03 DIAGNOSIS — Z08 Encounter for follow-up examination after completed treatment for malignant neoplasm: Secondary | ICD-10-CM | POA: Diagnosis not present

## 2016-08-03 DIAGNOSIS — Z800 Family history of malignant neoplasm of digestive organs: Secondary | ICD-10-CM | POA: Diagnosis not present

## 2016-08-03 DIAGNOSIS — Z90722 Acquired absence of ovaries, bilateral: Secondary | ICD-10-CM | POA: Diagnosis not present

## 2016-08-03 DIAGNOSIS — E785 Hyperlipidemia, unspecified: Secondary | ICD-10-CM | POA: Diagnosis not present

## 2016-08-03 DIAGNOSIS — Z90710 Acquired absence of both cervix and uterus: Secondary | ICD-10-CM | POA: Diagnosis not present

## 2016-08-03 DIAGNOSIS — Z9221 Personal history of antineoplastic chemotherapy: Secondary | ICD-10-CM | POA: Diagnosis not present

## 2016-08-03 DIAGNOSIS — Z86718 Personal history of other venous thrombosis and embolism: Secondary | ICD-10-CM | POA: Diagnosis not present

## 2016-08-03 DIAGNOSIS — Z8543 Personal history of malignant neoplasm of ovary: Secondary | ICD-10-CM | POA: Diagnosis not present

## 2016-10-01 NOTE — Progress Notes (Signed)
Cardiology Office Note   Date:  10/02/2016   ID:  [name], [name] 1936/01/01, MRN [id number]  PCP:  Leighton Ruff, MD  Cardiologist:   Skeet Latch, MD  Electrophysiologist: Allegra Lai, MD  Chief Complaint  Patient presents with  . Follow-up  . Shortness of Breath    due to being over weigh.  . Edema    in ankles.       History of Present Illness: [name] is a 81 y.o. female with hypertension, diabetes, prior DVT/PE, moderate aortic stenosis, complete heart block status post pacemaker, breast cancer status post left lumpectomy, and morbid obesity who presents for follow-up.  [name] was admitted to the hospital 04/2016 with symptomatic bradycardia. She was noted to be in complete heart block with a ventricular rate in the 30s. [name] implanted a St. Jude dual-chamber pacemaker on 04/21/16.  She had an echocardiogram 04/21/16 that revealed LVEF 60-65% with grade 1 diastolic dysfunction. She also had moderate aortic stenosis with a mean gradient of 33 mmHg. She was noted to have moderate mitral stenosis due to mitral annular calcification as well. The mean gradient 7 mmHg with a valve area by pressure half-time of 1.86 cm.  BNP was 612.    At her last appointment [name] was doing well.  Since that time she followed up with [name]. Her device was interrogated 07/2016 and was functioning properly.  She was noted to have an episode of asymptomatic atrial tachycardia during the time of her husband's surgery.  Since then she has been busy taking her husband to radiation appointments.  She often doesn't take her afternoon dose of lasix because it is late when she gets home.  She denies chest pain or shortness of breath.  Her edema has been a little worse and she is up 4lb.  She has been feeling tired because of all this travel and because of being the primary caretaker.     Past Medical History:  Diagnosis Date  . Aortic stenosis, moderate 2013  . Arthritis     . Cancer [facility])    CANCER  . Diabetes mellitus   . Hypertension   . Mitral stenosis 10/02/2016  . Morbid obesity (HCC)    BMI > 40  . Ovarian cancer ([place])   . Pulmonary embolism ([place]) 2013   post op  . Spinal stenosis     Past Surgical History:  Procedure Laterality Date  . ABDOMINAL HYSTERECTOMY  2013  . BREAST SURGERY  03-2004   lumpectomy   . PACEMAKER IMPLANT N/A 04/21/2016   Procedure: Pacemaker Implant;  Surgeon: Will Meredith Leeds, MD;  Location: [place] [facility];  Service: Cardiovascular;  Laterality: N/A;     Current Outpatient Prescriptions  Medication Sig Dispense Refill  . aspirin 81 MG tablet Take 81 mg by mouth daily.      [name] Kitchen atorvastatin (LIPITOR) 20 MG tablet Take 20 mg by mouth every evening.      . Calcium Citrate-Vitamin D (CALCIUM CITRATE + PO) Take by mouth 2 (two) times daily.     . Cholecalciferol (VITAMIN D) 2000 UNITS tablet Take 2,000 Units by mouth daily. 1,000 IU in am, and 1,000 IU in the pm.    . clobetasol cream (TEMOVATE) 4.40 % Apply 1 application topically 2 (two) times daily as needed for rash. irritations    . fish oil-omega-3 fatty acids 1000 MG capsule Take 1 g by mouth daily.     [name] Kitchen  FLUoxetine (PROZAC) 40 MG capsule Take 40 mg by mouth every morning.      . furosemide (LASIX) 40 MG tablet Take 1 tablet (40 mg total) by mouth 2 (two) times daily. 180 tablet 1  . LEVEMIR FLEXTOUCH 100 UNIT/ML Pen 130 Units. 65 units AM 65 units PM    . Magnesium 400 MG CAPS Take 400 mg by mouth every evening.     . meloxicam (MOBIC) 7.5 MG tablet Take 7.5 mg by mouth daily as needed for pain.    . metFORMIN (GLUCOPHAGE) 1000 MG tablet Take 1,000 mg by mouth 2 (two) times daily with a meal.     . Multiple Vitamin (MULTIVITAMIN) tablet Take 1 tablet by mouth daily.      [name] Kitchen olmesartan (BENICAR) 20 MG tablet Take 10 mg by mouth every evening.     No current facility-administered medications for this visit.     Allergies:   Other    Social History:  The  patient  reports that she has quit smoking. She has never used smokeless tobacco. She reports that she does not drink alcohol or use drugs.   Family History:  The patient's family history includes Cancer in her brother, father, and sister.    ROS:  Please see the history of present illness.   Otherwise, review of systems are positive for none.   All other systems are reviewed and negative.    PHYSICAL EXAM: VS:  BP (!) 110/59   Pulse 78   Ht 5' 7.5" (1.715 m)   Wt 126.2 kg (278 lb 3.2 oz)   BMI 42.93 kg/m  , BMI Body mass index is 42.93 kg/m. GENERAL:  Well appearing. No acute distress.  HEENT:  Pupils equal round and reactive, fundi not visualized, oral mucosa unremarkable NECK:  Difficult to assess JVD,  waveform within normal limits, carotid upstroke brisk and symmetric, no bruits LUNGS:  Clear to auscultation bilaterally.  No crackles, rhonchi or wheezes HEART:  RRR.  PMI not displaced or sustained,S1 and S2 within normal limits, no S3, no S4, no clicks, no rubs, III/VI systolic murmur at the LUSB. ABD:  Flat, positive bowel sounds normal in frequency in pitch, no bruits, no rebound, no guarding, no midline pulsatile mass, no hepatomegaly, no splenomegaly EXT:  2 plus pulses throughout, n1+ tense edema to bilateral upper tibias.  No cyanosis no clubbing SKIN:  No rashes no nodules NEURO:  Cranial nerves II through XII grossly intact, motor grossly intact throughout PSYCH:  Cognitively intact, oriented to person place and time   EKG:  EKG is not ordered today.  Echo 04/21/16: Study Conclusions  - Left ventricle: The cavity size was normal. Wall thickness was   increased in a pattern of severe LVH. Systolic function was   normal. The estimated ejection fraction was in the range of 60%   to 65%. Wall motion was normal; there were no regional wall   motion abnormalities. Doppler parameters are consistent with   abnormal left ventricular relaxation (grade 1 diastolic    dysfunction). - Aortic valve: Moderately to severely calcified annulus. Severely   thickened, severely calcified leaflets. There was moderate to   severe stenosis. There was trivial regurgitation. - Mitral valve: Severely calcified annulus. Severely calcified   leaflets . The findings are consistent with moderate stenosis.   There was mild to moderate regurgitation. Valve area by pressure   half-time: 1.86 cm^2. - Left atrium: The atrium was mildly dilated.   Recent Labs: 04/12/2016: ALT  23 04/20/2016: B Natriuretic Peptide 612.7; Magnesium 1.8; TSH 0.802 04/21/2016: Hemoglobin 10.9; Platelets 183 06/28/2016: BUN 26; Creat 1.23; Potassium 4.6; Sodium 141    Lipid Panel No results found for: CHOL, TRIG, HDL, CHOLHDL, VLDL, LDLCALC, LDLDIRECT    Wt Readings from Last 3 Encounters:  10/02/16 126.2 kg (278 lb 3.2 oz)  07/24/16 125.4 kg (276 lb 6.4 oz)  06/28/16 124.6 kg (274 lb 12.8 oz)      ASSESSMENT AND PLAN:  # Complete heart block s/p PPM: [name] is doing well.  Managed by [name].   # Chronic diastolic heart failure:  # Moderate aortic stenosis: # Moderate mitral stenosis: [name] is a little volume overloaded.  Her weight is up 4lb.  I suspect that this is because she hasn't been taking her afternoon lasix.  She will start back taking it twice daily. If her rate is not back to her baseline of 242 pounds by this time next week, she will increase her Lasix to 80 mg of the morning and 40 mg in the afternoon for 2 days. Repeat echo 04/2017.  # Hypertension: Blood pressure well-controlled.  Continue olmesartan.   # Hyperlipidemia:  Continue atorvastati   Current medicines are reviewed at length with the patient today.  The patient does not have concerns regarding medicines.  The following changes have been made:  no change  Labs/ tests ordered today include:   Orders Placed This Encounter  Procedures  . ECHOCARDIOGRAM COMPLETE     Disposition:   FU with [name]  C. Oval Linsey, MD, [facility] in 7 months.    This note was written with the assistance of speech recognition software.  Please excuse any transcriptional errors.  Signed, Deion Forgue C. Oval Linsey, MD, [facility]  10/02/2016 12:04 PM    [place]

## 2016-10-02 ENCOUNTER — Encounter: Payer: Self-pay | Admitting: Cardiovascular Disease

## 2016-10-02 ENCOUNTER — Ambulatory Visit (INDEPENDENT_AMBULATORY_CARE_PROVIDER_SITE_OTHER): Payer: Medicare Other | Admitting: Cardiovascular Disease

## 2016-10-02 VITALS — BP 110/59 | HR 78 | Ht 67.5 in | Wt 278.2 lb

## 2016-10-02 DIAGNOSIS — I10 Essential (primary) hypertension: Secondary | ICD-10-CM | POA: Diagnosis not present

## 2016-10-02 DIAGNOSIS — I350 Nonrheumatic aortic (valve) stenosis: Secondary | ICD-10-CM | POA: Diagnosis not present

## 2016-10-02 DIAGNOSIS — I342 Nonrheumatic mitral (valve) stenosis: Secondary | ICD-10-CM | POA: Diagnosis not present

## 2016-10-02 DIAGNOSIS — I050 Rheumatic mitral stenosis: Secondary | ICD-10-CM

## 2016-10-02 HISTORY — DX: Rheumatic mitral stenosis: I05.0

## 2016-10-02 NOTE — Patient Instructions (Signed)
Medication Instructions:  RESUME YOUR FUROSEMIDE (LASIX) TWICE A DAY  IF YOUR WEIGHT IS NOT BACK DOWN TO 242 NEXT WEEK TAKE FUROSEMIDE 80 MG IN THE MORNING AND 40 MG IN THE EVENING FOR 2 DAYS ONLY   Labwork: NONE   Testing/Procedures: Your physician has requested that you have an echocardiogram. Echocardiography is a painless test that uses sound waves to create images of your heart. It provides your doctor with information about the size and shape of your heart and how well your heart's chambers and valves are working. This procedure takes approximately one hour. There are no restrictions for this procedure. IN February   Follow-Up: Your physician wants you to follow-up in:  AFTER ECHO IN February   You will receive a reminder letter in the mail two months in advance. If you don't receive a letter, please call our office to schedule the follow-up appointment.   Any Other Special Instructions Will Be Listed Below (If Applicable).     If you need a refill on your cardiac medications before your next appointment, please call your pharmacy.

## 2016-10-23 ENCOUNTER — Ambulatory Visit (INDEPENDENT_AMBULATORY_CARE_PROVIDER_SITE_OTHER): Payer: Medicare Other | Admitting: *Deleted

## 2016-10-23 DIAGNOSIS — I442 Atrioventricular block, complete: Secondary | ICD-10-CM | POA: Diagnosis not present

## 2016-10-24 NOTE — Progress Notes (Signed)
Remote pacemaker transmission.   

## 2016-10-26 LAB — CUP PACEART REMOTE DEVICE CHECK
Battery Voltage: 3.01 V
Brady Statistic AP VP Percent: 1 %
Brady Statistic AP VS Percent: 1 %
Brady Statistic AS VP Percent: 20 %
Brady Statistic AS VS Percent: 79 %
Date Time Interrogation Session: 20180820062358
Implantable Lead Implant Date: 20180216
Implantable Lead Implant Date: 20180216
Implantable Pulse Generator Implant Date: 20180216
Lead Channel Impedance Value: 510 Ohm
Lead Channel Pacing Threshold Amplitude: 0.5 V
Lead Channel Pacing Threshold Pulse Width: 0.5 ms
Lead Channel Setting Pacing Amplitude: 1 V
MDC IDC LEAD LOCATION: 753859
MDC IDC LEAD LOCATION: 753860
MDC IDC MSMT BATTERY REMAINING LONGEVITY: 124 mo
MDC IDC MSMT BATTERY REMAINING PERCENTAGE: 95.5 %
MDC IDC MSMT LEADCHNL RA IMPEDANCE VALUE: 430 Ohm
MDC IDC MSMT LEADCHNL RA PACING THRESHOLD PULSEWIDTH: 0.5 ms
MDC IDC MSMT LEADCHNL RA SENSING INTR AMPL: 4.1 mV
MDC IDC MSMT LEADCHNL RV PACING THRESHOLD AMPLITUDE: 0.75 V
MDC IDC MSMT LEADCHNL RV SENSING INTR AMPL: 10.1 mV
MDC IDC PG SERIAL: 7998131
MDC IDC SET LEADCHNL RA PACING AMPLITUDE: 2 V
MDC IDC SET LEADCHNL RV PACING PULSEWIDTH: 0.5 ms
MDC IDC SET LEADCHNL RV SENSING SENSITIVITY: 2.5 mV
MDC IDC STAT BRADY RA PERCENT PACED: 1 %
MDC IDC STAT BRADY RV PERCENT PACED: 20 %

## 2016-11-03 ENCOUNTER — Encounter: Payer: Self-pay | Admitting: Cardiology

## 2016-11-03 DIAGNOSIS — I350 Nonrheumatic aortic (valve) stenosis: Secondary | ICD-10-CM | POA: Diagnosis not present

## 2016-11-03 DIAGNOSIS — F419 Anxiety disorder, unspecified: Secondary | ICD-10-CM | POA: Diagnosis not present

## 2016-11-03 DIAGNOSIS — E119 Type 2 diabetes mellitus without complications: Secondary | ICD-10-CM | POA: Diagnosis not present

## 2016-11-03 DIAGNOSIS — N183 Chronic kidney disease, stage 3 (moderate): Secondary | ICD-10-CM | POA: Diagnosis not present

## 2016-11-03 DIAGNOSIS — Z950 Presence of cardiac pacemaker: Secondary | ICD-10-CM | POA: Diagnosis not present

## 2016-11-03 DIAGNOSIS — E559 Vitamin D deficiency, unspecified: Secondary | ICD-10-CM | POA: Diagnosis not present

## 2016-11-03 DIAGNOSIS — Z7984 Long term (current) use of oral hypoglycemic drugs: Secondary | ICD-10-CM | POA: Diagnosis not present

## 2016-11-03 DIAGNOSIS — F339 Major depressive disorder, recurrent, unspecified: Secondary | ICD-10-CM | POA: Diagnosis not present

## 2016-11-03 DIAGNOSIS — E7800 Pure hypercholesterolemia, unspecified: Secondary | ICD-10-CM | POA: Diagnosis not present

## 2016-11-03 DIAGNOSIS — Z794 Long term (current) use of insulin: Secondary | ICD-10-CM | POA: Diagnosis not present

## 2016-11-03 DIAGNOSIS — I10 Essential (primary) hypertension: Secondary | ICD-10-CM | POA: Diagnosis not present

## 2016-11-03 DIAGNOSIS — R809 Proteinuria, unspecified: Secondary | ICD-10-CM | POA: Diagnosis not present

## 2016-11-07 DIAGNOSIS — Z6841 Body Mass Index (BMI) 40.0 and over, adult: Secondary | ICD-10-CM | POA: Diagnosis not present

## 2016-11-07 DIAGNOSIS — Z794 Long term (current) use of insulin: Secondary | ICD-10-CM | POA: Diagnosis not present

## 2016-11-07 DIAGNOSIS — Z853 Personal history of malignant neoplasm of breast: Secondary | ICD-10-CM | POA: Diagnosis not present

## 2016-11-07 DIAGNOSIS — Z08 Encounter for follow-up examination after completed treatment for malignant neoplasm: Secondary | ICD-10-CM | POA: Diagnosis not present

## 2016-11-07 DIAGNOSIS — Z9079 Acquired absence of other genital organ(s): Secondary | ICD-10-CM | POA: Diagnosis not present

## 2016-11-07 DIAGNOSIS — E119 Type 2 diabetes mellitus without complications: Secondary | ICD-10-CM | POA: Diagnosis not present

## 2016-11-07 DIAGNOSIS — Z8543 Personal history of malignant neoplasm of ovary: Secondary | ICD-10-CM | POA: Diagnosis not present

## 2016-11-07 DIAGNOSIS — Z86718 Personal history of other venous thrombosis and embolism: Secondary | ICD-10-CM | POA: Diagnosis not present

## 2016-11-07 DIAGNOSIS — C50212 Malignant neoplasm of upper-inner quadrant of left female breast: Secondary | ICD-10-CM | POA: Diagnosis not present

## 2016-11-07 DIAGNOSIS — Z90710 Acquired absence of both cervix and uterus: Secondary | ICD-10-CM | POA: Diagnosis not present

## 2016-11-07 DIAGNOSIS — M4807 Spinal stenosis, lumbosacral region: Secondary | ICD-10-CM | POA: Diagnosis not present

## 2016-11-07 DIAGNOSIS — Z90722 Acquired absence of ovaries, bilateral: Secondary | ICD-10-CM | POA: Diagnosis not present

## 2016-11-07 DIAGNOSIS — Z9889 Other specified postprocedural states: Secondary | ICD-10-CM | POA: Diagnosis not present

## 2016-11-07 DIAGNOSIS — Z9189 Other specified personal risk factors, not elsewhere classified: Secondary | ICD-10-CM | POA: Diagnosis not present

## 2016-11-07 DIAGNOSIS — Z170 Estrogen receptor positive status [ER+]: Secondary | ICD-10-CM | POA: Diagnosis not present

## 2016-11-15 ENCOUNTER — Other Ambulatory Visit: Payer: Self-pay | Admitting: Cardiovascular Disease

## 2016-11-15 NOTE — Telephone Encounter (Signed)
Refill Request.  

## 2017-01-17 DIAGNOSIS — C50212 Malignant neoplasm of upper-inner quadrant of left female breast: Secondary | ICD-10-CM | POA: Diagnosis not present

## 2017-01-17 DIAGNOSIS — Z1231 Encounter for screening mammogram for malignant neoplasm of breast: Secondary | ICD-10-CM | POA: Diagnosis not present

## 2017-01-17 DIAGNOSIS — Z170 Estrogen receptor positive status [ER+]: Secondary | ICD-10-CM | POA: Diagnosis not present

## 2017-01-17 DIAGNOSIS — C562 Malignant neoplasm of left ovary: Secondary | ICD-10-CM | POA: Diagnosis not present

## 2017-01-22 ENCOUNTER — Ambulatory Visit (INDEPENDENT_AMBULATORY_CARE_PROVIDER_SITE_OTHER): Payer: Medicare Other | Admitting: *Deleted

## 2017-01-22 DIAGNOSIS — I442 Atrioventricular block, complete: Secondary | ICD-10-CM | POA: Diagnosis not present

## 2017-01-22 NOTE — Progress Notes (Signed)
Remote pacemaker transmission.   

## 2017-01-23 LAB — CUP PACEART REMOTE DEVICE CHECK
Battery Remaining Longevity: 119 mo
Battery Remaining Percentage: 95.5 %
Brady Statistic AS VP Percent: 54 %
Brady Statistic RA Percent Paced: 1.2 %
Date Time Interrogation Session: 20181119070015
Implantable Lead Implant Date: 20180216
Implantable Lead Location: 753860
Implantable Pulse Generator Implant Date: 20180216
Lead Channel Impedance Value: 430 Ohm
Lead Channel Pacing Threshold Amplitude: 0.75 V
Lead Channel Pacing Threshold Pulse Width: 0.5 ms
Lead Channel Sensing Intrinsic Amplitude: 4 mV
Lead Channel Setting Sensing Sensitivity: 2.5 mV
MDC IDC LEAD IMPLANT DT: 20180216
MDC IDC LEAD LOCATION: 753859
MDC IDC MSMT BATTERY VOLTAGE: 3.01 V
MDC IDC MSMT LEADCHNL RA PACING THRESHOLD AMPLITUDE: 0.5 V
MDC IDC MSMT LEADCHNL RV IMPEDANCE VALUE: 510 Ohm
MDC IDC MSMT LEADCHNL RV PACING THRESHOLD PULSEWIDTH: 0.5 ms
MDC IDC MSMT LEADCHNL RV SENSING INTR AMPL: 12 mV
MDC IDC SET LEADCHNL RA PACING AMPLITUDE: 2 V
MDC IDC SET LEADCHNL RV PACING AMPLITUDE: 1 V
MDC IDC SET LEADCHNL RV PACING PULSEWIDTH: 0.5 ms
MDC IDC STAT BRADY AP VP PERCENT: 1.2 %
MDC IDC STAT BRADY AP VS PERCENT: 1 %
MDC IDC STAT BRADY AS VS PERCENT: 44 %
MDC IDC STAT BRADY RV PERCENT PACED: 55 %
Pulse Gen Model: 2272
Pulse Gen Serial Number: 7998131

## 2017-02-01 ENCOUNTER — Encounter: Payer: Self-pay | Admitting: Cardiology

## 2017-02-11 ENCOUNTER — Other Ambulatory Visit: Payer: Self-pay | Admitting: Cardiovascular Disease

## 2017-02-12 NOTE — Telephone Encounter (Signed)
Called pharmacy to give verbal refill due to transmission failure.

## 2017-03-20 DIAGNOSIS — I10 Essential (primary) hypertension: Secondary | ICD-10-CM | POA: Diagnosis not present

## 2017-03-20 DIAGNOSIS — H524 Presbyopia: Secondary | ICD-10-CM | POA: Diagnosis not present

## 2017-03-20 DIAGNOSIS — H35033 Hypertensive retinopathy, bilateral: Secondary | ICD-10-CM | POA: Diagnosis not present

## 2017-04-16 ENCOUNTER — Other Ambulatory Visit (HOSPITAL_COMMUNITY): Payer: Medicare Other

## 2017-04-18 ENCOUNTER — Ambulatory Visit (HOSPITAL_COMMUNITY): Payer: Medicare Other | Attending: Cardiovascular Disease

## 2017-04-18 ENCOUNTER — Telehealth: Payer: Self-pay | Admitting: Cardiovascular Disease

## 2017-04-18 ENCOUNTER — Encounter (HOSPITAL_COMMUNITY): Payer: Self-pay | Admitting: *Deleted

## 2017-04-18 ENCOUNTER — Encounter (HOSPITAL_COMMUNITY): Payer: Self-pay

## 2017-04-18 DIAGNOSIS — I050 Rheumatic mitral stenosis: Secondary | ICD-10-CM

## 2017-04-18 DIAGNOSIS — I350 Nonrheumatic aortic (valve) stenosis: Secondary | ICD-10-CM

## 2017-04-18 NOTE — Telephone Encounter (Signed)
New message    Patient calling, states she was unable complete echo due to joint pain. She was in pain while trying to lay on exam table. Patient wants to know what other testing can be done. Please call

## 2017-04-18 NOTE — Telephone Encounter (Signed)
Spoke with patient and she started hurting within the first 10 minutes of Echo. She sleeps sitting up and does not ever lay on her side so she did not realize she had progressed to this point. She could have done test standing if that was doable. She would like to know if there is another test that could be done in it's place. Will forward to Dr Oval Linsey for review

## 2017-04-18 NOTE — Progress Notes (Unsigned)
Patient could not do Echocardiogram due to her legs cramping.

## 2017-04-19 NOTE — Telephone Encounter (Signed)
Spoke with patient and she has been given pain medications in the past and it did not help so she quit taking.

## 2017-04-19 NOTE — Telephone Encounter (Signed)
Unfortunately this test can't be don't standing and there is no other test that can be done to get the information.  Can she take pain medication prior to getting the test?

## 2017-04-23 ENCOUNTER — Ambulatory Visit (INDEPENDENT_AMBULATORY_CARE_PROVIDER_SITE_OTHER): Payer: Medicare Other | Admitting: *Deleted

## 2017-04-23 DIAGNOSIS — I442 Atrioventricular block, complete: Secondary | ICD-10-CM | POA: Diagnosis not present

## 2017-04-24 NOTE — Progress Notes (Signed)
Remote pacemaker transmission.   

## 2017-04-25 LAB — CUP PACEART REMOTE DEVICE CHECK
Battery Remaining Longevity: 116 mo
Battery Remaining Percentage: 95.5 %
Brady Statistic RA Percent Paced: 1.3 %
Date Time Interrogation Session: 20190218070016
Implantable Lead Implant Date: 20180216
Implantable Lead Location: 753860
Lead Channel Impedance Value: 430 Ohm
Lead Channel Pacing Threshold Amplitude: 0.5 V
Lead Channel Pacing Threshold Pulse Width: 0.5 ms
Lead Channel Pacing Threshold Pulse Width: 0.5 ms
Lead Channel Sensing Intrinsic Amplitude: 4 mV
Lead Channel Setting Pacing Amplitude: 1 V
MDC IDC LEAD IMPLANT DT: 20180216
MDC IDC LEAD LOCATION: 753859
MDC IDC MSMT BATTERY VOLTAGE: 2.99 V
MDC IDC MSMT LEADCHNL RV IMPEDANCE VALUE: 490 Ohm
MDC IDC MSMT LEADCHNL RV PACING THRESHOLD AMPLITUDE: 0.75 V
MDC IDC MSMT LEADCHNL RV SENSING INTR AMPL: 11.3 mV
MDC IDC PG IMPLANT DT: 20180216
MDC IDC SET LEADCHNL RA PACING AMPLITUDE: 2 V
MDC IDC SET LEADCHNL RV PACING PULSEWIDTH: 0.5 ms
MDC IDC SET LEADCHNL RV SENSING SENSITIVITY: 2.5 mV
MDC IDC STAT BRADY AP VP PERCENT: 1.4 %
MDC IDC STAT BRADY AP VS PERCENT: 1 %
MDC IDC STAT BRADY AS VP PERCENT: 68 %
MDC IDC STAT BRADY AS VS PERCENT: 29 %
MDC IDC STAT BRADY RV PERCENT PACED: 70 %
Pulse Gen Model: 2272
Pulse Gen Serial Number: 7998131

## 2017-04-26 ENCOUNTER — Telehealth: Payer: Self-pay

## 2017-04-26 ENCOUNTER — Encounter: Payer: Self-pay | Admitting: Cardiology

## 2017-04-26 NOTE — Telephone Encounter (Signed)
LVM - need to start Metoprolol per [facility] result note from 2/18

## 2017-04-26 NOTE — Progress Notes (Signed)
Letter  

## 2017-04-26 NOTE — Telephone Encounter (Signed)
-----   Message from Will Meredith Leeds, MD sent at 04/26/2017  1:50 PM EST ----- Abnormal device interrogation reviewed.  Lead parameters and battery status stable.  VT noted. Start metoprolol 25 mg BID

## 2017-04-27 MED ORDER — METOPROLOL TARTRATE 25 MG PO TABS: 25.0000 mg | ORAL_TABLET | Freq: Two times a day (BID) | ORAL | 3 refills | Status: DC

## 2017-04-27 NOTE — Telephone Encounter (Signed)
Spoke with pt regarding Dr. Curt Bears recommendations to start Metoprolol 25mg  twice daily. Pt voiced understanding

## 2017-04-27 NOTE — Telephone Encounter (Signed)
Pt returning call

## 2017-05-03 NOTE — Progress Notes (Signed)
Electrophysiology Office Note   Date:  05/04/2017   ID:  [name], DOB 1935/03/07, MRN [id number]  PCP:  Leighton Ruff, MD  Primary Electrophysiologist:  Will Meredith Leeds, MD    Chief Complaint  Patient presents with  . Pacemaker Check    Complete heart block     History of Present Illness: [name] is a 82 y.o. female who is being seen today for the evaluation of heart block at the request of Leighton Ruff, MD. Presenting today for electrophysiology evaluation. She has history of diabetes, hypertension, ovarian cancer, breast cancer status post left lumpectomy with port on the right, obesity, arthritis, and spinal stenosis. She presented to [facility] on 04/20/16 with worsening shortness of breath lower extremity edema and exercise intolerance. She was found to be in complete heart block. She was admitted to the hospital and home metoprolol was held. Heart block continued and she had a St. Jude dual-chamber pacemaker placed on 04/21/16.   Today, denies symptoms of palpitations, chest pain, shortness of breath, orthopnea, PND, lower extremity edema, claudication, dizziness, presyncope, syncope, bleeding, or neurologic sequela. The patient is tolerating medications without difficulties.  He is overall feeling well.  She has no major complaints.  She is unfortunately going through cancer treatment of her husband.  Her husband has throat cancer and is been on a feeding tube since April 2018.   Past Medical History:  Diagnosis Date  . Aortic stenosis, moderate 2013  . Arthritis   . Cancer [facility])    CANCER  . Diabetes mellitus   . Hypertension   . Mitral stenosis 10/02/2016  . Morbid obesity (HCC)    BMI > 40  . Ovarian cancer ([place])   . Pulmonary embolism ([place]) 2013   post op  . Spinal stenosis    Past Surgical History:  Procedure Laterality Date  . ABDOMINAL HYSTERECTOMY  2013  . BREAST SURGERY  03-2004   lumpectomy   . PACEMAKER IMPLANT N/A 04/21/2016   Procedure: Pacemaker Implant;  Surgeon: Will Meredith Leeds, MD;  Location: [place] [facility];  Service: Cardiovascular;  Laterality: N/A;     Current Outpatient Medications  Medication Sig Dispense Refill  . aspirin 81 MG tablet Take 81 mg by mouth daily.      [name] Kitchen atorvastatin (LIPITOR) 20 MG tablet Take 20 mg by mouth every evening.      . Calcium Citrate-Vitamin D (CALCIUM CITRATE + PO) Take by mouth 2 (two) times daily.     . Cholecalciferol (VITAMIN D) 2000 UNITS tablet Take 2,000 Units by mouth daily. 1,000 IU in am, and 1,000 IU in the pm.    . clobetasol cream (TEMOVATE) 1.76 % Apply 1 application topically 2 (two) times daily as needed for rash. irritations    . fish oil-omega-3 fatty acids 1000 MG capsule Take 1 g by mouth daily.     [name] Kitchen FLUoxetine (PROZAC) 40 MG capsule Take 40 mg by mouth every morning.      . furosemide (LASIX) 40 MG tablet Take 1 tablet (40 mg total) by mouth 2 (two) times daily. 180 tablet 2  . LEVEMIR FLEXTOUCH 100 UNIT/ML Pen 130 Units. 65 units AM 65 units PM    . Magnesium 400 MG CAPS Take 400 mg by mouth every evening.     . metFORMIN (GLUCOPHAGE) 1000 MG tablet Take 1,000 mg by mouth 2 (two) times daily with a meal.     . metoprolol tartrate (LOPRESSOR) 25 MG tablet Take  1 tablet (25 mg total) by mouth 2 (two) times daily. 180 tablet 3  . Multiple Vitamin (MULTIVITAMIN) tablet Take 1 tablet by mouth daily.      [name] Kitchen olmesartan (BENICAR) 20 MG tablet Take 10 mg by mouth every evening.     No current facility-administered medications for this visit.     Allergies:   Other   Social History:  The patient  reports that she has quit smoking. she has never used smokeless tobacco. She reports that she does not drink alcohol or use drugs.   Family History:  The patient's family history includes Cancer in her brother, father, and sister.   ROS:  Please see the history of present illness.   Otherwise, review of systems is positive for back pain, balance problems.    All other systems are reviewed and negative.   PHYSICAL EXAM: VS:  BP 130/78   Pulse 63   Ht 5' 7.5" (1.715 m)   Wt 274 lb (124.3 kg)   BMI 42.28 kg/m  , BMI Body mass index is 42.28 kg/m. GEN: Well nourished, well developed, in no acute distress  HEENT: normal  Neck: no JVD, carotid bruits, or masses Cardiac: RRR; 2 out of 6 systolic murmur at the left upper sternal border, no rubs, or gallops,no edema  Respiratory:  clear to auscultation bilaterally, normal work of breathing GI: soft, nontender, nondistended, + BS MS: no deformity or atrophy  Skin: warm and dry, device site well healed Neuro:  Strength and sensation are intact Psych: euthymic mood, full affect  EKG:  EKG is ordered today. Personal review of the ekg ordered shows sinus rhythm, V pacing  Personal review of the device interrogation today. Results in [place]: 06/28/2016: BUN 26; Creat 1.23; Potassium 4.6; Sodium 141    Lipid Panel  No results found for: CHOL, TRIG, HDL, CHOLHDL, VLDL, LDLCALC, LDLDIRECT   Wt Readings from Last 3 Encounters:  05/04/17 274 lb (124.3 kg)  10/02/16 278 lb 3.2 oz (126.2 kg)  07/24/16 276 lb 6.4 oz (125.4 kg)      Other studies Reviewed: Additional studies/ records that were reviewed today include: TTE 04/21/16, hospital records  Review of the above records today demonstrates:  - Left ventricle: The cavity size was normal. Wall thickness was   increased in a pattern of severe LVH. Systolic function was   normal. The estimated ejection fraction was in the range of 60%   to 65%. Wall motion was normal; there were no regional wall   motion abnormalities. Doppler parameters are consistent with   abnormal left ventricular relaxation (grade 1 diastolic   dysfunction). - Aortic valve: Moderately to severely calcified annulus. Severely   thickened, severely calcified leaflets. There was moderate to   severe stenosis. There was trivial regurgitation. - Mitral valve:  Severely calcified annulus. Severely calcified   leaflets . The findings are consistent with moderate stenosis.   There was mild to moderate regurgitation. Valve area by pressure   half-time: 1.86 cm^2. - Left atrium: The atrium was mildly dilated.   ASSESSMENT AND PLAN:  1.  Complete AV block: Jude dual-chamber pacemaker implanted 04/23/16.  Device functioning appropriately.  No changes.  2. Hypertension: Controlled today.  No changes.  3. Moderate aortic stenosis: Currently asymptomatic.  No changes.  4. Atrial tachycardia: Found on device interrogation.  The patient does have palpitations and was put on metoprolol.  This is greatly improved her symptoms.  Current medicines are reviewed at  length with the patient today.   The patient does not have concerns regarding her medicines.  The following changes were made today:  none  Labs/ tests ordered today include:  No orders of the defined types were placed in this encounter.    Disposition:   FU with Will Camnitz 12 months  Signed, Will Meredith Leeds, MD  05/04/2017 11:12 AM     [facility] [address] [place] [address] [phone] (office) [phone] (fax)

## 2017-05-04 ENCOUNTER — Ambulatory Visit (INDEPENDENT_AMBULATORY_CARE_PROVIDER_SITE_OTHER): Payer: Medicare Other | Admitting: Cardiology

## 2017-05-04 ENCOUNTER — Encounter: Payer: Self-pay | Admitting: Cardiology

## 2017-05-04 VITALS — BP 130/78 | HR 63 | Ht 67.5 in | Wt 274.0 lb

## 2017-05-04 DIAGNOSIS — I442 Atrioventricular block, complete: Secondary | ICD-10-CM | POA: Diagnosis not present

## 2017-05-04 DIAGNOSIS — I350 Nonrheumatic aortic (valve) stenosis: Secondary | ICD-10-CM | POA: Diagnosis not present

## 2017-05-04 DIAGNOSIS — I10 Essential (primary) hypertension: Secondary | ICD-10-CM | POA: Diagnosis not present

## 2017-05-04 DIAGNOSIS — I471 Supraventricular tachycardia: Secondary | ICD-10-CM | POA: Diagnosis not present

## 2017-05-04 LAB — CUP PACEART INCLINIC DEVICE CHECK
Brady Statistic RA Percent Paced: 2.1 %
Brady Statistic RV Percent Paced: 71 %
Date Time Interrogation Session: 20190301150750
Implantable Lead Implant Date: 20180216
Implantable Lead Location: 753860
Lead Channel Impedance Value: 412.5 Ohm
Lead Channel Impedance Value: 475 Ohm
Lead Channel Pacing Threshold Amplitude: 0.75 V
Lead Channel Pacing Threshold Pulse Width: 0.5 ms
Lead Channel Pacing Threshold Pulse Width: 0.5 ms
Lead Channel Sensing Intrinsic Amplitude: 3.8 mV
Lead Channel Setting Pacing Amplitude: 1 V
Lead Channel Setting Pacing Amplitude: 2 V
Lead Channel Setting Pacing Pulse Width: 0.5 ms
MDC IDC LEAD IMPLANT DT: 20180216
MDC IDC LEAD LOCATION: 753859
MDC IDC MSMT BATTERY REMAINING LONGEVITY: 118 mo
MDC IDC MSMT BATTERY VOLTAGE: 3.01 V
MDC IDC MSMT LEADCHNL RA PACING THRESHOLD AMPLITUDE: 0.75 V
MDC IDC MSMT LEADCHNL RV PACING THRESHOLD AMPLITUDE: 0.75 V
MDC IDC MSMT LEADCHNL RV PACING THRESHOLD PULSEWIDTH: 0.5 ms
MDC IDC MSMT LEADCHNL RV SENSING INTR AMPL: 8.8 mV
MDC IDC PG IMPLANT DT: 20180216
MDC IDC SET LEADCHNL RV SENSING SENSITIVITY: 2.5 mV
Pulse Gen Model: 2272
Pulse Gen Serial Number: 7998131

## 2017-05-04 NOTE — Patient Instructions (Addendum)
Medication Instructions:  Your physician recommends that you continue on your current medications as directed. Please refer to the Current Medication list given to you today.  *If you need a refill on your cardiac medications before your next appointment, please call your pharmacy*  Labwork: None ordered  Testing/Procedures: None ordered  Follow-Up: Remote monitoring is used to monitor your Pacemaker or ICD from home. This monitoring reduces the number of office visits required to check your device to one time per year. It allows [place] to keep an eye on the functioning of your device to ensure it is working properly. You are scheduled for a device check from home on 07/23/2017. You may send your transmission at any time that day. If you have a wireless device, the transmission will be sent automatically. After your physician reviews your transmission, you will receive a postcard with your next transmission date.  Your physician wants you to follow-up in: 1 year with Dr. Curt Bears.  You will receive a reminder letter in the mail two months in advance. If you don't receive a letter, please call our office to schedule the follow-up appointment.  Thank you for choosing [facility]!!   Trinidad Curet, RN [phone]

## 2017-05-04 NOTE — Telephone Encounter (Signed)
Spoke with Lorriane Shire regarding possibly doing Echo while patient sitting. She said they could attempt but patient may need definity for better pictures. Advised patient and scheduled for 05/14/17.

## 2017-05-07 DIAGNOSIS — E559 Vitamin D deficiency, unspecified: Secondary | ICD-10-CM | POA: Diagnosis not present

## 2017-05-07 DIAGNOSIS — F419 Anxiety disorder, unspecified: Secondary | ICD-10-CM | POA: Diagnosis not present

## 2017-05-07 DIAGNOSIS — N183 Chronic kidney disease, stage 3 (moderate): Secondary | ICD-10-CM | POA: Diagnosis not present

## 2017-05-07 DIAGNOSIS — R809 Proteinuria, unspecified: Secondary | ICD-10-CM | POA: Diagnosis not present

## 2017-05-07 DIAGNOSIS — F339 Major depressive disorder, recurrent, unspecified: Secondary | ICD-10-CM | POA: Diagnosis not present

## 2017-05-07 DIAGNOSIS — E7800 Pure hypercholesterolemia, unspecified: Secondary | ICD-10-CM | POA: Diagnosis not present

## 2017-05-07 DIAGNOSIS — I10 Essential (primary) hypertension: Secondary | ICD-10-CM | POA: Diagnosis not present

## 2017-05-07 DIAGNOSIS — I350 Nonrheumatic aortic (valve) stenosis: Secondary | ICD-10-CM | POA: Diagnosis not present

## 2017-05-07 DIAGNOSIS — E119 Type 2 diabetes mellitus without complications: Secondary | ICD-10-CM | POA: Diagnosis not present

## 2017-05-07 DIAGNOSIS — Z794 Long term (current) use of insulin: Secondary | ICD-10-CM | POA: Diagnosis not present

## 2017-05-14 ENCOUNTER — Ambulatory Visit (HOSPITAL_COMMUNITY): Payer: Medicare Other | Attending: Cardiovascular Disease

## 2017-05-14 ENCOUNTER — Other Ambulatory Visit: Payer: Self-pay

## 2017-05-14 DIAGNOSIS — E119 Type 2 diabetes mellitus without complications: Secondary | ICD-10-CM | POA: Insufficient documentation

## 2017-05-14 DIAGNOSIS — I350 Nonrheumatic aortic (valve) stenosis: Secondary | ICD-10-CM

## 2017-05-14 DIAGNOSIS — I050 Rheumatic mitral stenosis: Secondary | ICD-10-CM

## 2017-05-14 DIAGNOSIS — I10 Essential (primary) hypertension: Secondary | ICD-10-CM | POA: Diagnosis not present

## 2017-05-30 ENCOUNTER — Other Ambulatory Visit: Payer: Self-pay | Admitting: Cardiovascular Disease

## 2017-05-30 ENCOUNTER — Encounter: Payer: Self-pay | Admitting: Cardiovascular Disease

## 2017-05-30 ENCOUNTER — Ambulatory Visit (INDEPENDENT_AMBULATORY_CARE_PROVIDER_SITE_OTHER): Payer: Medicare Other | Admitting: Cardiovascular Disease

## 2017-05-30 VITALS — BP 112/60 | HR 60 | Ht 67.0 in | Wt 275.0 lb

## 2017-05-30 DIAGNOSIS — I350 Nonrheumatic aortic (valve) stenosis: Secondary | ICD-10-CM | POA: Diagnosis not present

## 2017-05-30 LAB — CBC WITH DIFFERENTIAL/PLATELET
BASOS ABS: 0.1 10*3/uL (ref 0.0–0.2)
Basos: 1 %
EOS (ABSOLUTE): 0.4 10*3/uL (ref 0.0–0.4)
Eos: 5 %
HEMOGLOBIN: 12 g/dL (ref 11.1–15.9)
Hematocrit: 36.8 % (ref 34.0–46.6)
Immature Grans (Abs): 0 10*3/uL (ref 0.0–0.1)
Immature Granulocytes: 0 %
LYMPHS ABS: 2.6 10*3/uL (ref 0.7–3.1)
Lymphs: 33 %
MCH: 29.9 pg (ref 26.6–33.0)
MCHC: 32.6 g/dL (ref 31.5–35.7)
MCV: 92 fL (ref 79–97)
MONOCYTES: 13 %
MONOS ABS: 1 10*3/uL — AB (ref 0.1–0.9)
Neutrophils Absolute: 3.7 10*3/uL (ref 1.4–7.0)
Neutrophils: 48 %
PLATELETS: 192 10*3/uL (ref 150–379)
RBC: 4.01 x10E6/uL (ref 3.77–5.28)
RDW: 14.9 % (ref 12.3–15.4)
WBC: 7.8 10*3/uL (ref 3.4–10.8)

## 2017-05-30 LAB — BASIC METABOLIC PANEL
BUN/Creatinine Ratio: 22 (ref 12–28)
BUN: 29 mg/dL — ABNORMAL HIGH (ref 8–27)
CHLORIDE: 100 mmol/L (ref 96–106)
CO2: 26 mmol/L (ref 20–29)
Calcium: 9.7 mg/dL (ref 8.7–10.3)
Creatinine, Ser: 1.33 mg/dL — ABNORMAL HIGH (ref 0.57–1.00)
GFR calc Af Amer: 44 mL/min/{1.73_m2} — ABNORMAL LOW (ref >59)
GFR calc non Af Amer: 38 mL/min/{1.73_m2} — ABNORMAL LOW (ref >59)
GLUCOSE: 123 mg/dL — AB (ref 65–99)
POTASSIUM: 4.2 mmol/L (ref 3.5–5.2)
SODIUM: 144 mmol/L (ref 134–144)

## 2017-05-30 LAB — PROTIME-INR
INR: 1.1 (ref 0.8–1.2)
PROTHROMBIN TIME: 11.1 s (ref 9.1–12.0)

## 2017-05-30 NOTE — Progress Notes (Signed)
Cardiology Office Note Date:  05/30/2017   ID:  [name], DOB [birth year], MRN [id number]  PCP:  Leighton Ruff, MD  Cardiologist:  Skeet Latch, MD  Chief Complaint  Patient presents with  . Shortness of Breath     History of Present Illness: [name] is a 82 y.o. female who presents for evaluation of severe aortic stenosis, referred by Dr Oval Linsey.  Her past medical history is pertinent for complete heart block s/p permanent pacemaker placement in February 2018, Type II DM, obesity, HTN, and arthritis.   The patient is here with her husband today. She admits to progressive shortness of breath over the past year. She ambulates with a walker, attributes limitation to shortness of breath and severe arthritis. She has bilateral knee pain and back pain. Able to walk half way to her mailbox but has to stop and rest because of her breathing. Denies orthopnea or PND, but has slept in a recliner for years. She admits to chronic edema. Also admits to chest pressure with walking and tingling in both hands. Symptoms resolve with rest. No lightheadedness or syncope. She previously had near syncope and was found to have complete heart block - felt the 'lights going on and off' - underwent PPM placement last year.  In 2006 she was diagnosed with breast cancer, treated with radiation and lumpectomy. In 2012 the patient underwent surgery and chemotherapy for ovarian cancer. She has a hx of DVT at that time and has experienced edema ever since that time.   Past Medical History:  Diagnosis Date  . Aortic stenosis, moderate 2013  . Arthritis   . Cancer [facility])    CANCER  . Diabetes mellitus   . Hypertension   . Mitral stenosis 10/02/2016  . Morbid obesity (HCC)    BMI > 40  . Ovarian cancer ([place])   . Pulmonary embolism ([place]) 2013   post op  . Spinal stenosis     Past Surgical History:  Procedure Laterality Date  . ABDOMINAL HYSTERECTOMY  2013  . BREAST SURGERY  03-2004   lumpectomy   . PACEMAKER IMPLANT N/A 04/21/2016   Procedure: Pacemaker Implant;  Surgeon: Will Meredith Leeds, MD;  Location: [facility];  Service: Cardiovascular;  Laterality: N/A;    Current Outpatient Medications  Medication Sig Dispense Refill  . aspirin 81 MG tablet Take 81 mg by mouth daily.      [name] Kitchen atorvastatin (LIPITOR) 20 MG tablet Take 20 mg by mouth every evening.      . Calcium Citrate-Vitamin D (CALCIUM CITRATE + PO) Take by mouth 2 (two) times daily.     . Cholecalciferol (VITAMIN D) 2000 UNITS tablet Take 2,000 Units by mouth daily. 1,000 IU in am, and 1,000 IU in the pm.    . clobetasol cream (TEMOVATE) 6.29 % Apply 1 application topically 2 (two) times daily as needed for rash. irritations    . fish oil-omega-3 fatty acids 1000 MG capsule Take 1 g by mouth daily.     [name] Kitchen FLUoxetine (PROZAC) 40 MG capsule Take 40 mg by mouth every morning.      . furosemide (LASIX) 40 MG tablet Take 1.5 tablets (60 mg) by mouth in the morning    . LEVEMIR FLEXTOUCH 100 UNIT/ML Pen 130 Units. 65 units AM 65 units PM    . Magnesium 400 MG CAPS Take 400 mg by mouth every evening.     . metFORMIN (GLUCOPHAGE) 1000 MG tablet Take 1,000 mg by  mouth 2 (two) times daily with a meal.     . metoprolol tartrate (LOPRESSOR) 25 MG tablet Take 25 mg by mouth 2 (two) times daily.    . metoprolol tartrate (LOPRESSOR) 25 MG tablet Take 0.5 tablet (12.5 mg) by mouth twice daily    . Multiple Vitamin (MULTIVITAMIN) tablet Take 1 tablet by mouth daily.      [name] Kitchen olmesartan (BENICAR) 20 MG tablet Take 10 mg by mouth every evening.     No current facility-administered medications for this visit.     Allergies:   Other   Social History:  The patient  reports that she has quit smoking. She has never used smokeless tobacco. She reports that she does not drink alcohol or use drugs.   Family History:  The patient's  family history includes Cancer in her brother, father, and sister.    ROS:  Please see the  history of present illness.  Otherwise, review of systems is positive for snoring, DOE, back pain, muscle pain.  All other systems are reviewed and negative.    PHYSICAL EXAM: VS:  BP 112/60   Pulse 60   Ht 5\' 7"  (1.702 m)   Wt 275 lb (124.7 kg)   SpO2 (!) 60%   BMI 43.07 kg/m  , BMI Body mass index is 43.07 kg/m. GEN: Well nourished, well developed obese woman, in no acute distress  HEENT: normal  Neck: no JVD, no masses. No carotid bruits Cardiac: RRR with 3/6 harsh late peaking systolic murmur at the RUSB              Respiratory:  clear to auscultation bilaterally, normal work of breathing GI: soft, nontender, nondistended, + BS MS: no deformity or atrophy  Ext: tace bilateral pretibial edema, pedal pulses 2+= bilaterally Skin: warm and dry, no rash Neuro:  Strength and sensation are intact Psych: euthymic mood, full affect  EKG:  EKG is not ordered today.  Recent Labs: 06/28/2016: BUN 26; Creat 1.23; Potassium 4.6; Sodium 141   Lipid Panel  No results found for: CHOL, TRIG, HDL, CHOLHDL, VLDL, LDLCALC, LDLDIRECT    Wt Readings from Last 3 Encounters:  05/30/17 275 lb (124.7 kg)  05/04/17 274 lb (124.3 kg)  10/02/16 278 lb 3.2 oz (126.2 kg)     Cardiac Studies Reviewed: 2D Echo 05-14-2017: Left ventricle:  The cavity size was normal. There was moderate concentric hypertrophy. Systolic function was normal. The estimated ejection fraction was in the range of 55% to 60%. Wall motion was normal; there were no regional wall motion abnormalities. Doppler parameters are consistent with abnormal left ventricular relaxation (grade 1 diastolic dysfunction). Doppler parameters are consistent with high ventricular filling pressure.  ------------------------------------------------------------------- Aortic valve:  Poorly visualized.  Probably trileaflet; severely thickened, severely calcified leaflets. Valve mobility was restricted.  Doppler:   There was critical stenosis.    There was no regurgitation.    VTI ratio of LVOT to aortic valve: 0.18. Valve area (VTI): 0.57 cm^2. Indexed valve area (VTI): 0.23 cm^2/m^2. Peak velocity ratio of LVOT to aortic valve: 0.21. Valve area (Vmax): 0.65 cm^2. Indexed valve area (Vmax): 0.26 cm^2/m^2. Mean velocity ratio of LVOT to aortic valve: 0.16. Valve area (Vmean): 0.51 cm^2. Indexed valve area (Vmean): 0.21 cm^2/m^2.    Mean gradient (S): 62 mm Hg. Peak gradient (S): 92 mm Hg.  ------------------------------------------------------------------- Aorta:  Aortic root: The aortic root was normal in size.  ------------------------------------------------------------------- Mitral valve:   Severely calcified annulus. Mobility was restricted.  Doppler:  The findings are consistent with mild stenosis.   There was mild regurgitation.    Valve area by pressure half-time: 1.93 cm^2. Indexed valve area by pressure half-time: 0.77 cm^2/m^2. Valve area by continuity equation (using LVOT flow): 1.43 cm^2. Indexed valve area by continuity equation (using LVOT flow): 0.57 cm^2/m^2.    Mean gradient (D): 4 mm Hg. Peak gradient (D): 7 mm Hg.  ------------------------------------------------------------------- Left atrium:  The atrium was severely dilated.  ------------------------------------------------------------------- Right ventricle:  The cavity size was normal. Wall thickness was normal. Systolic function was normal.  ------------------------------------------------------------------- Pulmonic valve:    Doppler:  Transvalvular velocity was within the normal range. There was no evidence for stenosis.  ------------------------------------------------------------------- Tricuspid valve:   Structurally normal valve.    Doppler: Transvalvular velocity was within the normal range. There was mild regurgitation.  ------------------------------------------------------------------- Pulmonary artery:   The main pulmonary  artery was normal-sized. Systolic pressure was mildly increased.  ------------------------------------------------------------------- Right atrium:  The atrium was normal in size.  ------------------------------------------------------------------- Pericardium:  There was no pericardial effusion.  ------------------------------------------------------------------- Systemic veins: Inferior vena cava: The vessel was normal in size. The respirophasic diameter changes were in the normal range (= 50%), consistent with normal central venous pressure. Diameter: 21 mm.  ------------------------------------------------------------------- Measurements   IVC                                      Value          Reference  ID                                       21    mm       ----------    Left ventricle                           Value          Reference  LV ID, ED, PLAX chordal                  49.4  mm       43 - 52  LV ID, ES, PLAX chordal                  36.4  mm       23 - 38  LV fx shortening, PLAX chordal   (L)     26    %        >=29  LV PW thickness, ED                      12.46 mm       ----------  IVS/LV PW ratio, ED                      1.12           <=1.3  Stroke volume, 2D                        80    ml       ----------  Stroke volume/bsa, 2D                    32    ml/m^2   ----------  LV e&', lateral                           5     cm/s     ----------  LV E/e&', lateral                         27.2           ----------  LV e&', medial                            5.22  cm/s     ----------  LV E/e&', medial                          26.05          ----------  LV e&', average                           5.11  cm/s     ----------  LV E/e&', average                         26.61          ----------    Ventricular septum                       Value          Reference  IVS thickness, ED                        13.97 mm       ----------    LVOT                                      Value          Reference  LVOT ID, S                               20    mm       ----------  LVOT area                                3.14  cm^2     ----------  LVOT peak velocity, S                    99    cm/s     ----------  LVOT mean velocity, S                    61.5  cm/s     ----------  LVOT VTI, S                              25.6  cm       ----------    Aortic valve                             Value  Reference  Aortic valve peak velocity, S            478   cm/s     ----------  Aortic valve mean velocity, S            376   cm/s     ----------  Aortic valve VTI, S                      140   cm       ----------  Aortic mean gradient, S                  62    mm Hg    ----------  Aortic peak gradient, S                  92    mm Hg    ----------  VTI ratio, LVOT/AV                       0.18           ----------  Aortic valve area, VTI                   0.57  cm^2     ----------  Aortic valve area/bsa, VTI               0.23  cm^2/m^2 ----------  Velocity ratio, peak, LVOT/AV            0.21           ----------  Aortic valve area, peak velocity         0.65  cm^2     ----------  Aortic valve area/bsa, peak              0.26  cm^2/m^2 ----------  velocity  Velocity ratio, mean, LVOT/AV            0.16           ----------  Aortic valve area, mean velocity         0.51  cm^2     ----------  Aortic valve area/bsa, mean              0.21  cm^2/m^2 ----------  velocity  Aortic regurg pressure half-time         751   ms       ----------    Aorta                                    Value          Reference  Aortic root ID, ED                       31    mm       ----------    Left atrium                              Value          Reference  LA ID, A-P, ES                           43    mm       ----------  LA ID/bsa, A-P  1.72  cm/m^2   <=2.2  LA volume, S                             119   ml       ----------  LA volume/bsa, S                          47.7  ml/m^2   ----------  LA volume, ES, 1-p A4C                   105   ml       ----------  LA volume/bsa, ES, 1-p A4C               42.1  ml/m^2   ----------  LA volume, ES, 1-p A2C                   136   ml       ----------  LA volume/bsa, ES, 1-p A2C               54.5  ml/m^2   ----------    Mitral valve                             Value          Reference  Mitral E-wave peak velocity              136   cm/s     ----------  Mitral A-wave peak velocity              153   cm/s     ----------  Mitral mean velocity, D                  89.2  cm/s     ----------  Mitral deceleration time         (H)     398   ms       150 - 230  Mitral pressure half-time                115   ms       ----------  Mitral mean gradient, D                  4     mm Hg    ----------  Mitral peak gradient, D                  7     mm Hg    ----------  Mitral E/A ratio, peak                   0.9            ----------  Mitral valve area, PHT, DP               1.93  cm^2     ----------  Mitral valve area/bsa, PHT, DP           0.77  cm^2/m^2 ----------  Mitral valve area, LVOT                  1.43  cm^2     ----------  continuity  Mitral valve area/bsa, LVOT              0.57  cm^2/m^2 ----------  continuity  Mitral annulus VTI, D                    56.1  cm       ----------  Mitral regurg VTI, PISA                  209   cm       ----------  Mitral ERO, PISA                         0.1   cm^2     ----------  Mitral regurg volume, PISA               21    ml       ----------    Pulmonary arteries                       Value          Reference  PA pressure, S, DP               (H)     44    mm Hg    <=30    Tricuspid valve                          Value          Reference  Tricuspid regurg peak velocity           300   cm/s     ----------  Tricuspid peak RV-RA gradient            36    mm Hg    ----------  Tricuspid maximal regurg                 300   cm/s     ----------  velocity, PISA    Right  atrium                             Value          Reference  RA ID, S-I, ES, A4C              (H)     57.3  mm       34 - 49  RA area, ES, A4C                 (H)     20.6  cm^2     8.3 - 19.5  RA volume, ES, A/L                       59.9  ml       ----------  RA volume/bsa, ES, A/L                   24    ml/m^2   ----------    Systemic veins                           Value          Reference  Estimated CVP                            8     mm Hg    ----------  Right ventricle                          Value          Reference  TAPSE                                    28.5  mm       ----------  RV pressure, S, DP               (H)     44    mm Hg    <=30  RV s&', lateral, S                        17.3  cm/s     ----------  STS RISK CALCULATOR: Risk of Mortality: 3.375% Renal Failure: 6.479% Permanent Stroke: 1.249% Prolonged Ventilation: 15.124% DSW Infection: 0.246% Reoperation: 3.071% Morbidity or Mortality: 20.523% Short Length of Stay: 16.692% Long Length of Stay: 12.265%  ASSESSMENT AND PLAN: 82 yo woman with severe, Stage D1, aortic stenosis. The patient has NYHA Functional Class 3 symptoms of chronic diastolic heart failure. Comorbid medical conditions include morbid obesity, Type II DM, and severe arthritis with spinal stenosis. Exam and echo findings are diagnostic of severe aortic stenosis. I have reviewed the natural history of aortic stenosis with the patient and their family members who are present today. We have discussed the limitations of medical therapy and the poor prognosis associated with symptomatic aortic stenosis. We have reviewed potential treatment options, including palliative medical therapy, conventional surgical aortic valve replacement, and transcatheter aortic valve replacement. We discussed treatment options in the context of the patient's specific comorbid medical conditions.   Considering her moderately increased STS-PROM based on advanced age,  morbid obesity, diabetes, and reduced functional capacity related to arthritis and obesity, TAVR might be a reasonable treatment option if her anatomy is suitable. We discussed next steps in her evaluation to include R/L heart catheterization, CT angiography of the heart and chest, abdomen, pelvis. Once her studies are completed she will be referred for cardiac surgical evaluation as part of a multidisciplinary review of her treatment options. I reviewed the TAVR procedure with the patient, discussion included potential risks, typical post-op course and recovery, and potential benefits.   I have reviewed the risks, indications, and alternatives to cardiac catheterization, possible angioplasty, and stenting with the patient. Risks include but are not limited to bleeding, infection, vascular injury, stroke, myocardial infection, arrhythmia, kidney injury, radiation-related injury in the case of prolonged fluoroscopy use, emergency cardiac surgery, and death. The patient understands the risks of serious complication is 1-2 in 9629 with diagnostic cardiac cath and 1-2% or less with angioplasty/stenting.   Current medicines are reviewed with the patient today.  The patient does not have concerns regarding medicines.  Labs/ tests ordered today include:   Orders Placed This Encounter  Procedures  . CT ANGIO ABDOMEN PELVIS  W &/OR WO CONTRAST  . CT CORONARY MORPH W/CTA COR W/SCORE W/CA W/CM &/OR WO/CM  . CT ANGIO CHEST AORTA W &/OR WO CONTRAST  . Basic metabolic panel  . CBC with Differential/Platelet  . INR/PT  . Pulmonary Function Test    Disposition:   FU pending further test results  Signed, Sherren Mocha, MD  05/30/2017 2:05 PM    [facility]  [address] [place], [place]  [address] Phone: [phone]; Fax: [phone]

## 2017-05-30 NOTE — H&P (View-Only) (Signed)
Cardiology Office Note Date:  05/30/2017   ID:  [name], DOB 05-21-1935, MRN [id number]  PCP:  Leighton Ruff, MD  Cardiologist:  Skeet Latch, MD  Chief Complaint  Patient presents with  . Shortness of Breath     History of Present Illness: [name] is a 82 y.o. female who presents for evaluation of severe aortic stenosis, referred by Dr Oval Linsey.  Her past medical history is pertinent for complete heart block s/p permanent pacemaker placement in February 2018, Type II DM, obesity, HTN, and arthritis.   The patient is here with her husband today. She admits to progressive shortness of breath over the past year. She ambulates with a walker, attributes limitation to shortness of breath and severe arthritis. She has bilateral knee pain and back pain. Able to walk half way to her mailbox but has to stop and rest because of her breathing. Denies orthopnea or PND, but has slept in a recliner for years. She admits to chronic edema. Also admits to chest pressure with walking and tingling in both hands. Symptoms resolve with rest. No lightheadedness or syncope. She previously had near syncope and was found to have complete heart block - felt the 'lights going on and off' - underwent PPM placement last year.  In 2006 she was diagnosed with breast cancer, treated with radiation and lumpectomy. In 2012 the patient underwent surgery and chemotherapy for ovarian cancer. She has a hx of DVT at that time and has experienced edema ever since that time.   Past Medical History:  Diagnosis Date  . Aortic stenosis, moderate 2013  . Arthritis   . Cancer [facility])    CANCER  . Diabetes mellitus   . Hypertension   . Mitral stenosis 10/02/2016  . Morbid obesity (HCC)    BMI > 40  . Ovarian cancer ([place])   . Pulmonary embolism ([place]) 2013   post op  . Spinal stenosis     Past Surgical History:  Procedure Laterality Date  . ABDOMINAL HYSTERECTOMY  2013  . BREAST SURGERY  03-2004   lumpectomy   . PACEMAKER IMPLANT N/A 04/21/2016   Procedure: Pacemaker Implant;  Surgeon: Will Meredith Leeds, MD;  Location: [facility];  Service: Cardiovascular;  Laterality: N/A;    Current Outpatient Medications  Medication Sig Dispense Refill  . aspirin 81 MG tablet Take 81 mg by mouth daily.      [name] Kitchen atorvastatin (LIPITOR) 20 MG tablet Take 20 mg by mouth every evening.      . Calcium Citrate-Vitamin D (CALCIUM CITRATE + PO) Take by mouth 2 (two) times daily.     . Cholecalciferol (VITAMIN D) 2000 UNITS tablet Take 2,000 Units by mouth daily. 1,000 IU in am, and 1,000 IU in the pm.    . clobetasol cream (TEMOVATE) 7.82 % Apply 1 application topically 2 (two) times daily as needed for rash. irritations    . fish oil-omega-3 fatty acids 1000 MG capsule Take 1 g by mouth daily.     [name] Kitchen FLUoxetine (PROZAC) 40 MG capsule Take 40 mg by mouth every morning.      . furosemide (LASIX) 40 MG tablet Take 1.5 tablets (60 mg) by mouth in the morning    . LEVEMIR FLEXTOUCH 100 UNIT/ML Pen 130 Units. 65 units AM 65 units PM    . Magnesium 400 MG CAPS Take 400 mg by mouth every evening.     . metFORMIN (GLUCOPHAGE) 1000 MG tablet Take 1,000 mg by  mouth 2 (two) times daily with a meal.     . metoprolol tartrate (LOPRESSOR) 25 MG tablet Take 25 mg by mouth 2 (two) times daily.    . metoprolol tartrate (LOPRESSOR) 25 MG tablet Take 0.5 tablet (12.5 mg) by mouth twice daily    . Multiple Vitamin (MULTIVITAMIN) tablet Take 1 tablet by mouth daily.      [name] Kitchen olmesartan (BENICAR) 20 MG tablet Take 10 mg by mouth every evening.     No current facility-administered medications for this visit.     Allergies:   Other   Social History:  The patient  reports that she has quit smoking. She has never used smokeless tobacco. She reports that she does not drink alcohol or use drugs.   Family History:  The patient's  family history includes Cancer in her brother, father, and sister.    ROS:  Please see the  history of present illness.  Otherwise, review of systems is positive for snoring, DOE, back pain, muscle pain.  All other systems are reviewed and negative.    PHYSICAL EXAM: VS:  BP 112/60   Pulse 60   Ht 5\' 7"  (1.702 m)   Wt 275 lb (124.7 kg)   SpO2 (!) 60%   BMI 43.07 kg/m  , BMI Body mass index is 43.07 kg/m. GEN: Well nourished, well developed obese woman, in no acute distress  HEENT: normal  Neck: no JVD, no masses. No carotid bruits Cardiac: RRR with 3/6 harsh late peaking systolic murmur at the RUSB              Respiratory:  clear to auscultation bilaterally, normal work of breathing GI: soft, nontender, nondistended, + BS MS: no deformity or atrophy  Ext: tace bilateral pretibial edema, pedal pulses 2+= bilaterally Skin: warm and dry, no rash Neuro:  Strength and sensation are intact Psych: euthymic mood, full affect  EKG:  EKG is not ordered today.  Recent Labs: 06/28/2016: BUN 26; Creat 1.23; Potassium 4.6; Sodium 141   Lipid Panel  No results found for: CHOL, TRIG, HDL, CHOLHDL, VLDL, LDLCALC, LDLDIRECT    Wt Readings from Last 3 Encounters:  05/30/17 275 lb (124.7 kg)  05/04/17 274 lb (124.3 kg)  10/02/16 278 lb 3.2 oz (126.2 kg)     Cardiac Studies Reviewed: 2D Echo 05-14-2017: Left ventricle:  The cavity size was normal. There was moderate concentric hypertrophy. Systolic function was normal. The estimated ejection fraction was in the range of 55% to 60%. Wall motion was normal; there were no regional wall motion abnormalities. Doppler parameters are consistent with abnormal left ventricular relaxation (grade 1 diastolic dysfunction). Doppler parameters are consistent with high ventricular filling pressure.  ------------------------------------------------------------------- Aortic valve:  Poorly visualized.  Probably trileaflet; severely thickened, severely calcified leaflets. Valve mobility was restricted.  Doppler:   There was critical stenosis.    There was no regurgitation.    VTI ratio of LVOT to aortic valve: 0.18. Valve area (VTI): 0.57 cm^2. Indexed valve area (VTI): 0.23 cm^2/m^2. Peak velocity ratio of LVOT to aortic valve: 0.21. Valve area (Vmax): 0.65 cm^2. Indexed valve area (Vmax): 0.26 cm^2/m^2. Mean velocity ratio of LVOT to aortic valve: 0.16. Valve area (Vmean): 0.51 cm^2. Indexed valve area (Vmean): 0.21 cm^2/m^2.    Mean gradient (S): 62 mm Hg. Peak gradient (S): 92 mm Hg.  ------------------------------------------------------------------- Aorta:  Aortic root: The aortic root was normal in size.  ------------------------------------------------------------------- Mitral valve:   Severely calcified annulus. Mobility was restricted.  Doppler:  The findings are consistent with mild stenosis.   There was mild regurgitation.    Valve area by pressure half-time: 1.93 cm^2. Indexed valve area by pressure half-time: 0.77 cm^2/m^2. Valve area by continuity equation (using LVOT flow): 1.43 cm^2. Indexed valve area by continuity equation (using LVOT flow): 0.57 cm^2/m^2.    Mean gradient (D): 4 mm Hg. Peak gradient (D): 7 mm Hg.  ------------------------------------------------------------------- Left atrium:  The atrium was severely dilated.  ------------------------------------------------------------------- Right ventricle:  The cavity size was normal. Wall thickness was normal. Systolic function was normal.  ------------------------------------------------------------------- Pulmonic valve:    Doppler:  Transvalvular velocity was within the normal range. There was no evidence for stenosis.  ------------------------------------------------------------------- Tricuspid valve:   Structurally normal valve.    Doppler: Transvalvular velocity was within the normal range. There was mild regurgitation.  ------------------------------------------------------------------- Pulmonary artery:   The main pulmonary  artery was normal-sized. Systolic pressure was mildly increased.  ------------------------------------------------------------------- Right atrium:  The atrium was normal in size.  ------------------------------------------------------------------- Pericardium:  There was no pericardial effusion.  ------------------------------------------------------------------- Systemic veins: Inferior vena cava: The vessel was normal in size. The respirophasic diameter changes were in the normal range (= 50%), consistent with normal central venous pressure. Diameter: 21 mm.  ------------------------------------------------------------------- Measurements   IVC                                      Value          Reference  ID                                       21    mm       ----------    Left ventricle                           Value          Reference  LV ID, ED, PLAX chordal                  49.4  mm       43 - 52  LV ID, ES, PLAX chordal                  36.4  mm       23 - 38  LV fx shortening, PLAX chordal   (L)     26    %        >=29  LV PW thickness, ED                      12.46 mm       ----------  IVS/LV PW ratio, ED                      1.12           <=1.3  Stroke volume, 2D                        80    ml       ----------  Stroke volume/bsa, 2D                    32    ml/m^2   ----------  LV e&', lateral                           5     cm/s     ----------  LV E/e&', lateral                         27.2           ----------  LV e&', medial                            5.22  cm/s     ----------  LV E/e&', medial                          26.05          ----------  LV e&', average                           5.11  cm/s     ----------  LV E/e&', average                         26.61          ----------    Ventricular septum                       Value          Reference  IVS thickness, ED                        13.97 mm       ----------    LVOT                                      Value          Reference  LVOT ID, S                               20    mm       ----------  LVOT area                                3.14  cm^2     ----------  LVOT peak velocity, S                    99    cm/s     ----------  LVOT mean velocity, S                    61.5  cm/s     ----------  LVOT VTI, S                              25.6  cm       ----------    Aortic valve                             Value  Reference  Aortic valve peak velocity, S            478   cm/s     ----------  Aortic valve mean velocity, S            376   cm/s     ----------  Aortic valve VTI, S                      140   cm       ----------  Aortic mean gradient, S                  62    mm Hg    ----------  Aortic peak gradient, S                  92    mm Hg    ----------  VTI ratio, LVOT/AV                       0.18           ----------  Aortic valve area, VTI                   0.57  cm^2     ----------  Aortic valve area/bsa, VTI               0.23  cm^2/m^2 ----------  Velocity ratio, peak, LVOT/AV            0.21           ----------  Aortic valve area, peak velocity         0.65  cm^2     ----------  Aortic valve area/bsa, peak              0.26  cm^2/m^2 ----------  velocity  Velocity ratio, mean, LVOT/AV            0.16           ----------  Aortic valve area, mean velocity         0.51  cm^2     ----------  Aortic valve area/bsa, mean              0.21  cm^2/m^2 ----------  velocity  Aortic regurg pressure half-time         751   ms       ----------    Aorta                                    Value          Reference  Aortic root ID, ED                       31    mm       ----------    Left atrium                              Value          Reference  LA ID, A-P, ES                           43    mm       ----------  LA ID/bsa, A-P  1.72  cm/m^2   <=2.2  LA volume, S                             119   ml       ----------  LA volume/bsa, S                          47.7  ml/m^2   ----------  LA volume, ES, 1-p A4C                   105   ml       ----------  LA volume/bsa, ES, 1-p A4C               42.1  ml/m^2   ----------  LA volume, ES, 1-p A2C                   136   ml       ----------  LA volume/bsa, ES, 1-p A2C               54.5  ml/m^2   ----------    Mitral valve                             Value          Reference  Mitral E-wave peak velocity              136   cm/s     ----------  Mitral A-wave peak velocity              153   cm/s     ----------  Mitral mean velocity, D                  89.2  cm/s     ----------  Mitral deceleration time         (H)     398   ms       150 - 230  Mitral pressure half-time                115   ms       ----------  Mitral mean gradient, D                  4     mm Hg    ----------  Mitral peak gradient, D                  7     mm Hg    ----------  Mitral E/A ratio, peak                   0.9            ----------  Mitral valve area, PHT, DP               1.93  cm^2     ----------  Mitral valve area/bsa, PHT, DP           0.77  cm^2/m^2 ----------  Mitral valve area, LVOT                  1.43  cm^2     ----------  continuity  Mitral valve area/bsa, LVOT              0.57  cm^2/m^2 ----------  continuity  Mitral annulus VTI, D                    56.1  cm       ----------  Mitral regurg VTI, PISA                  209   cm       ----------  Mitral ERO, PISA                         0.1   cm^2     ----------  Mitral regurg volume, PISA               21    ml       ----------    Pulmonary arteries                       Value          Reference  PA pressure, S, DP               (H)     44    mm Hg    <=30    Tricuspid valve                          Value          Reference  Tricuspid regurg peak velocity           300   cm/s     ----------  Tricuspid peak RV-RA gradient            36    mm Hg    ----------  Tricuspid maximal regurg                 300   cm/s     ----------  velocity, PISA    Right  atrium                             Value          Reference  RA ID, S-I, ES, A4C              (H)     57.3  mm       34 - 49  RA area, ES, A4C                 (H)     20.6  cm^2     8.3 - 19.5  RA volume, ES, A/L                       59.9  ml       ----------  RA volume/bsa, ES, A/L                   24    ml/m^2   ----------    Systemic veins                           Value          Reference  Estimated CVP                            8     mm Hg    ----------  Right ventricle                          Value          Reference  TAPSE                                    28.5  mm       ----------  RV pressure, S, DP               (H)     44    mm Hg    <=30  RV s&', lateral, S                        17.3  cm/s     ----------  STS RISK CALCULATOR: Risk of Mortality: 3.375% Renal Failure: 6.479% Permanent Stroke: 1.249% Prolonged Ventilation: 15.124% DSW Infection: 0.246% Reoperation: 3.071% Morbidity or Mortality: 20.523% Short Length of Stay: 16.692% Long Length of Stay: 12.265%  ASSESSMENT AND PLAN: 82 yo woman with severe, Stage D1, aortic stenosis. The patient has NYHA Functional Class 3 symptoms of chronic diastolic heart failure. Comorbid medical conditions include morbid obesity, Type II DM, and severe arthritis with spinal stenosis. Exam and echo findings are diagnostic of severe aortic stenosis. I have reviewed the natural history of aortic stenosis with the patient and their family members who are present today. We have discussed the limitations of medical therapy and the poor prognosis associated with symptomatic aortic stenosis. We have reviewed potential treatment options, including palliative medical therapy, conventional surgical aortic valve replacement, and transcatheter aortic valve replacement. We discussed treatment options in the context of the patient's specific comorbid medical conditions.   Considering her moderately increased STS-PROM based on advanced age,  morbid obesity, diabetes, and reduced functional capacity related to arthritis and obesity, TAVR might be a reasonable treatment option if her anatomy is suitable. We discussed next steps in her evaluation to include R/L heart catheterization, CT angiography of the heart and chest, abdomen, pelvis. Once her studies are completed she will be referred for cardiac surgical evaluation as part of a multidisciplinary review of her treatment options. I reviewed the TAVR procedure with the patient, discussion included potential risks, typical post-op course and recovery, and potential benefits.   I have reviewed the risks, indications, and alternatives to cardiac catheterization, possible angioplasty, and stenting with the patient. Risks include but are not limited to bleeding, infection, vascular injury, stroke, myocardial infection, arrhythmia, kidney injury, radiation-related injury in the case of prolonged fluoroscopy use, emergency cardiac surgery, and death. The patient understands the risks of serious complication is 1-2 in 0932 with diagnostic cardiac cath and 1-2% or less with angioplasty/stenting.   Current medicines are reviewed with the patient today.  The patient does not have concerns regarding medicines.  Labs/ tests ordered today include:   Orders Placed This Encounter  Procedures  . CT ANGIO ABDOMEN PELVIS  W &/OR WO CONTRAST  . CT CORONARY MORPH W/CTA COR W/SCORE W/CA W/CM &/OR WO/CM  . CT ANGIO CHEST AORTA W &/OR WO CONTRAST  . Basic metabolic panel  . CBC with Differential/Platelet  . INR/PT  . Pulmonary Function Test    Disposition:   FU pending further test results  Signed, Sherren Mocha, MD  05/30/2017 2:05 PM    [facility]  [address] [place], [place]  [address] Phone: [phone]; Fax: [phone]

## 2017-05-30 NOTE — Patient Instructions (Addendum)
Medication Instructions:  Your provider recommends that you continue on your current medications as directed. Please refer to the Current Medication list given to you today.    Labwork: TODAY: pre-cath labs  Testing/Procedures: Your physician has requested that you have a cardiac catheterization. Cardiac catheterization is used to diagnose and/or treat various heart conditions. Doctors may recommend this procedure for a number of different reasons. The most common reason is to evaluate chest pain. Chest pain can be a symptom of coronary artery disease (CAD), and cardiac catheterization can show whether plaque is narrowing or blocking your heart's arteries. This procedure is also used to evaluate the valves, as well as measure the blood flow and oxygen levels in different parts of your heart. For further information please visit [email or web address]. Please follow instruction sheet, as given.  Follow-Up: Ander Purpura, the TAVR nurse, will contact you to arrange future appointments.   Any Other Special Instructions Will Be Listed Below (If Applicable).   [facility] [address] [place] [address] [place] [address] Dept: [phone] Loc: [place]  05/30/2017  You are scheduled for a Cardiac Catheterization on Wednesday, April 10 with Dr. Sherren Mocha.  1. Please arrive at the [facility] (Main Entrance A) at [facility]: [address] [place], [place] [address] at 6:30 AM (two hours before your procedure to ensure your preparation). Free valet parking service is available.   Special note: Every effort is made to have your procedure done on time. Please understand that emergencies sometimes delay scheduled procedures.  2. Diet: Do not eat or drink anything after midnight prior to your procedure except sips of water to take medications.  3. Labs: Today!  4. Medication instructions  in preparation for your procedure:  1) Take only a half dose of insulin (Levemir) the night before your cath.   2) Do not take any insulin (Levemir) the morning of your cath.  3) DO NOT TAKE METFORMIN the day before or morning of your cath.  4) DO NOT TAKE LASIX the morning of your cath.  5) MAKE SURE TO TAKE YOUR ASPIRIN.   6) you may drink sips of water to take your aspirin and other medications not listed.   5. Plan for one night stay--bring personal belongings. 6. Bring a current list of your medications and current insurance cards. 7. You MUST have a responsible person to drive you home. 8. Someone MUST be with you the first 24 hours after you arrive home or your discharge will be delayed. 9. Please wear clothes that are easy to get on and off and wear slip-on shoes.  Thank you for allowing [place] to care for you!   -- [facility]     If you need a refill on your cardiac medications before your next appointment, please call your pharmacy.

## 2017-06-05 ENCOUNTER — Encounter: Payer: Self-pay | Admitting: Cardiovascular Disease

## 2017-06-05 ENCOUNTER — Ambulatory Visit (HOSPITAL_COMMUNITY)
Admission: RE | Admit: 2017-06-05 | Discharge: 2017-06-05 | Disposition: A | Discharge status: Home / Self Care | Payer: Medicare Other | Source: Ambulatory Visit | Attending: Cardiology | Admitting: Cardiology

## 2017-06-05 ENCOUNTER — Ambulatory Visit (INDEPENDENT_AMBULATORY_CARE_PROVIDER_SITE_OTHER): Payer: Medicare Other | Admitting: Cardiovascular Disease

## 2017-06-05 VITALS — BP 128/62 | HR 60 | Ht 67.0 in | Wt 274.0 lb

## 2017-06-05 DIAGNOSIS — R0602 Shortness of breath: Secondary | ICD-10-CM | POA: Diagnosis not present

## 2017-06-05 DIAGNOSIS — R0683 Snoring: Secondary | ICD-10-CM | POA: Diagnosis not present

## 2017-06-05 DIAGNOSIS — I10 Essential (primary) hypertension: Secondary | ICD-10-CM | POA: Diagnosis not present

## 2017-06-05 DIAGNOSIS — R400 Somnolence: Secondary | ICD-10-CM

## 2017-06-05 DIAGNOSIS — I442 Atrioventricular block, complete: Secondary | ICD-10-CM | POA: Diagnosis not present

## 2017-06-05 DIAGNOSIS — I6521 Occlusion and stenosis of right carotid artery: Secondary | ICD-10-CM | POA: Diagnosis not present

## 2017-06-05 DIAGNOSIS — I350 Nonrheumatic aortic (valve) stenosis: Secondary | ICD-10-CM

## 2017-06-05 NOTE — Progress Notes (Signed)
Cardiology Office Note   Date:  06/06/2017   ID:  [name], DOB Apr 06, 1935, MRN [id number]  PCP:  Leighton Ruff, MD  Cardiologist:   Skeet Latch, MD  Electrophysiologist: Allegra Lai, MD  No chief complaint on file.     History of Present Illness: [name] is a 82 y.o. female with hypertension, diabetes, prior DVT/PE, severe aortic stenosis, complete heart block status post pacemaker, breast cancer status post left lumpectomy, and morbid obesity who presents for follow-up.  [name] was admitted to the hospital 04/2016 with symptomatic bradycardia. She was noted to be in complete heart block with a ventricular rate in the 30s. Dr. Curt Bears implanted a [employer] dual-chamber pacemaker on 04/21/16.  She had an echocardiogram 04/21/16 that revealed LVEF 60-65% with grade 1 diastolic dysfunction. She also had moderate aortic stenosis with a mean gradient of 33 mmHg. She was noted to have moderate mitral stenosis due to mitral annular calcification as well. The mean gradient 7 mmHg with a valve area by pressure half-time of 1.86 cm.  BNP was 612.     Since her last appointment [name] has been feeling well.  She had a repeat echocardiogram 05/2017 that revealed LVEF 55-60% and grade 1 diastolic dysfunction.  She was found to have critical aortic stenosis with a mean gradient of 62 mmHg.  She was referred to Dr. Burt Knack and his in the process of undergoing workup for TAVR.  She feels generally fatigued.  She also has some lower extremity edema that responds to Lasix but she is only able to take 60 mg daily.  She takes more than that she is in the bathroom all day.  Overall her weight has been stable.  She has no chest pain, lightheadedness, dizziness or orthopnea.  She has exertional dyspnea.  She notes that she snores and that she feels tired upon awakening in the morning.  She has daytime somnolence and has been told that she has apneic episodes.   Past Medical History:    Diagnosis Date  . Aortic stenosis, moderate 2013  . Arthritis   . Cancer [facility])    CANCER  . Diabetes mellitus   . Hypertension   . Mitral stenosis 10/02/2016  . Morbid obesity (HCC)    BMI > 40  . Ovarian cancer ([place])   . Pulmonary embolism ([place]) 2013   post op  . Spinal stenosis     Past Surgical History:  Procedure Laterality Date  . ABDOMINAL HYSTERECTOMY  2013  . BREAST SURGERY  03-2004   lumpectomy   . PACEMAKER IMPLANT N/A 04/21/2016   Procedure: Pacemaker Implant;  Surgeon: Will Meredith Leeds, MD;  Location: [facility];  Service: Cardiovascular;  Laterality: N/A;     Current Outpatient Medications  Medication Sig Dispense Refill  . aspirin 81 MG tablet Take 81 mg by mouth daily.      [name] Kitchen atorvastatin (LIPITOR) 20 MG tablet Take 20 mg by mouth every evening.      . Calcium Citrate-Vitamin D (CALCIUM CITRATE + PO) Take 1 tablet by mouth 2 (two) times daily.     . cholecalciferol (VITAMIN D) 1000 units tablet Take 1,000 Units by mouth 2 (two) times daily.     . fish oil-omega-3 fatty acids 1000 MG capsule Take 1 g by mouth every evening.     [name] Kitchen FLUoxetine (PROZAC) 40 MG capsule Take 40 mg by mouth every morning.      . furosemide (LASIX)  40 MG tablet Take 60 mg by mouth daily.     [name] Kitchen LEVEMIR FLEXTOUCH 100 UNIT/ML Pen Inject 60-70 Units into the skin 2 (two) times daily. Per sliding scale    . Magnesium 400 MG CAPS Take 400 mg by mouth every evening.     . metFORMIN (GLUCOPHAGE) 1000 MG tablet Take 1,000 mg by mouth 2 (two) times daily with a meal.     . Multiple Vitamin (MULTIVITAMIN) tablet Take 1 tablet by mouth daily.      [name] Kitchen olmesartan (BENICAR) 20 MG tablet Take 10 mg by mouth every evening.    [name] Kitchen acetaminophen (TYLENOL) 650 MG CR tablet Take 1,300 mg by mouth every 8 (eight) hours as needed for pain.    . metoprolol tartrate (LOPRESSOR) 25 MG tablet Take 12.5 mg by mouth 2 (two) times daily.      No current facility-administered medications for this visit.      Allergies:   Other    Social History:  The patient  reports that she has quit smoking. She has never used smokeless tobacco. She reports that she does not drink alcohol or use drugs.   Family History:  The patient's family history includes Cancer in her brother, father, and sister.    ROS:  Please see the history of present illness.   Otherwise, review of systems are positive for none.   All other systems are reviewed and negative.    PHYSICAL EXAM: VS:  BP 128/62   Pulse 60   Ht 5\' 7"  (1.702 m)   Wt 274 lb (124.3 kg)   SpO2 92%   BMI 42.91 kg/m   , BMI Body mass index is 42.91 kg/m. GENERAL:  Well appearing HEENT: Pupils equal round and reactive, fundi not visualized, oral mucosa unremarkable NECK:  No jugular venous distention, waveform within normal limits, carotid upstroke brisk and symmetric, no bruits, no thyromegaly LYMPHATICS:  No cervical adenopathy LUNGS:  Clear to auscultation bilaterally HEART:  RRR.  PMI not displaced or sustained,S1 and S2 within normal limits, no S3, no S4, no clicks, no rubs, III/VI late-peaking systolic murmur at the RUSB. ABD:  Flat, positive bowel sounds normal in frequency in pitch, no bruits, no rebound, no guarding, no midline pulsatile mass, no hepatomegaly, no splenomegaly EXT:  2 plus pulses throughout, 1+ pitting LE edema, no cyanosis no clubbing SKIN:  No rashes no nodules NEURO:  Cranial nerves II through XII grossly intact, motor grossly intact throughout PSYCH:  Cognitively intact, oriented to person place and time   EKG:  EKG is not ordered today.  Echo 04/21/16: Study Conclusions  - Left ventricle: The cavity size was normal. Wall thickness was   increased in a pattern of severe LVH. Systolic function was   normal. The estimated ejection fraction was in the range of 60%   to 65%. Wall motion was normal; there were no regional wall   motion abnormalities. Doppler parameters are consistent with   abnormal left ventricular  relaxation (grade 1 diastolic   dysfunction). - Aortic valve: Moderately to severely calcified annulus. Severely   thickened, severely calcified leaflets. There was moderate to   severe stenosis. There was trivial regurgitation. - Mitral valve: Severely calcified annulus. Severely calcified   leaflets . The findings are consistent with moderate stenosis.   There was mild to moderate regurgitation. Valve area by pressure   half-time: 1.86 cm^2. - Left atrium: The atrium was mildly dilated.  Echo 05/14/17: Study Conclusions  - Left ventricle:  The cavity size was normal. There was moderate   concentric hypertrophy. Systolic function was normal. The   estimated ejection fraction was in the range of 55% to 60%. Wall   motion was normal; there were no regional wall motion   abnormalities. Doppler parameters are consistent with abnormal   left ventricular relaxation (grade 1 diastolic dysfunction).   Doppler parameters are consistent with high ventricular filling   pressure. - Aortic valve: Probably trileaflet; severely thickened, severely   calcified leaflets. Valve mobility was restricted. There was   critical stenosis. There was no regurgitation. Peak velocity (S):   478 cm/s. Mean gradient (S): 62 mm Hg. Valve area (VTI): 0.57   cm^2. Valve area (Vmax): 0.65 cm^2. Valve area (Vmean): 0.51   cm^2. - Mitral valve: Severely calcified annulus. Mobility was   restricted. The findings are consistent with mild stenosis. There   was mild regurgitation. Valve area by pressure half-time: 1.93   cm^2. Valve area by continuity equation (using LVOT flow): 1.43   cm^2. - Left atrium: The atrium was severely dilated. - Right ventricle: The cavity size was normal. Wall thickness was   normal. Systolic function was normal. - Tricuspid valve: There was mild regurgitation. - Pulmonary arteries: Systolic pressure was mildly increased. PA   peak pressure: 44 mm Hg (S).  Recent Labs: 05/30/2017: BUN  29; Creatinine, Ser 1.33; Hemoglobin 12.0; Platelets 192; Potassium 4.2; Sodium 144    Lipid Panel No results found for: CHOL, TRIG, HDL, CHOLHDL, VLDL, LDLCALC, LDLDIRECT    Wt Readings from Last 3 Encounters:  06/05/17 274 lb (124.3 kg)  05/30/17 275 lb (124.7 kg)  05/04/17 274 lb (124.3 kg)      ASSESSMENT AND PLAN:  # Complete heart block s/p PPM: [name] is doing well.  Managed by Dr. Curt Bears.   # Severe aortic stenosis:   # Chronic diastolic heart failure:  # Moderate mitral stenosis: Undergoing TAVR work up with Dr. Burt Knack.  She has no chest pain or syncope but does have heart failure symptoms.  She is unable to tolerate higher doses of lasix.  Her weight is stable.   # Hypertension: Blood pressure well-controlled.  Continue olmesartan.   # Hyperlipidemia:  Continue atorvastatin  # Likely OSA: Symptoms are very concerning for OSA.  WIll get a sleep study.   Current medicines are reviewed at length with the patient today.  The patient does not have concerns regarding medicines.  The following changes have been made:  no change  Labs/ tests ordered today include:   Orders Placed This Encounter  Procedures  . Split night study     Disposition:   FU with Emily Chismar C. Oval Linsey, MD, [facility] in 3 months.    This note was written with the assistance of speech recognition software.  Please excuse any transcriptional errors.  Signed, Belma Dyches C. Oval Linsey, MD, [facility]  06/06/2017 5:09 PM    [facility]

## 2017-06-05 NOTE — Patient Instructions (Addendum)
Medication Instructions:  Your physician recommends that you continue on your current medications as directed. Please refer to the Current Medication list given to you today.  Labwork: NONE  Testing/Procedures: Your physician has recommended that you have a sleep study. This test records several body functions during sleep, including: brain activity, eye movement, oxygen and carbon dioxide blood levels, heart rate and rhythm, breathing rate and rhythm, the flow of air through your mouth and nose, snoring, body muscle movements, and chest and belly movement. THE OFFICE WILL CALL YOU ONCE APPROVED BY INSURANCE TO SET UP DATE AND TIME   Follow-Up: Your physician recommends that you schedule a follow-up appointment in: 3 MONTHS   If you need a refill on your cardiac medications before your next appointment, please call your pharmacy.  Sleep Studies A sleep study (polysomnogram) is a series of tests done while you are sleeping. It can show how well you sleep. This can help your health care provider diagnose a sleep disorder and show how severe your sleep disorder is. A sleep study may lead to treatment that will help you sleep better and prevent other medical problems caused by poor sleep. If you have a sleep disorder, you may also be at risk for:  Sleep-related accidents.  High blood pressure.  Heart disease.  Stroke.  Other medical conditions.  Sleep disorders are common. Your health care provider may suspect a sleep disorder if you:  Have loud snoring most nights.  Have brief periods when you stop breathing at night.  Feel sleepy on most days.  Fall asleep suddenly during the day.  Have trouble falling asleep or staying asleep.  Feel like you need to move your legs when trying to fall asleep.  Have dreams that seem very real shortly after falling asleep.  Feel like you cannot move when you first wake up.  Which tests will I need to have? Most sleep studies last all night  and include these tests:  Recordings of your brain activity.  Recordings of your eye movements.  Recording of your heart rate and rhythm.  Blood pressure readings.  Readings of the amount of oxygen in your blood.  Measurements of your chest and belly movement as you breathe during sleep.  If you have signs of the sleep disorder called sleep apnea during your test, you may get a mask to wear for the second half of the night.  The mask provides continuous positive airway pressure (CPAP). This may improve sleep apnea significantly.  You will then have all tests done again with the mask in place to see if your measurements and recordings change.  How are sleep studies done? Most sleep studies are done over one full night of sleep.  You will arrive at the study center in the evening and can go home in the morning.  Bring your pajamas and toothbrush.  Do not have caffeine on the day of your sleep study.  Your health care provider will let you know if you need to stop taking any of your regular medicines before the test.  To do the tests included in a polysomnogram, you will have:  Round, sticky patches with sensors attached to recording wires (electrodes) placed on your scalp, face, chest, and limbs.  Wires from all the electrodes and sensors run from your bed to a computer. The wires can be taken off and put back on if you need to get out of bed to go to the bathroom.  A sensor placed over  your nose to measure airflow.  A finger clip put on one finger to measure your blood oxygen level.  A belt around your belly and a belt around your chest to measure breathing movements.  Where are sleep studies done? Sleep studies are done at sleep centers. A sleep center may be inside a hospital, office, or clinic. The room where you have the study may look like a hospital room or a hotel room. The health care providers doing the study may come in and out of the room during the study. Most  of the time, they will be in another room monitoring your test. How is information from sleep studies helpful? A polysomnogram can be used along with your medical history and a physical exam to diagnose conditions, such as:  Sleep apnea.  Restless legs syndrome.  Sleep-related seizure disorders.  Sleep-related movement disorders.  A medical doctor who specializes in sleep will evaluate your sleep study. The specialist will share the results with your primary health care provider. Treatments based on your sleep study may include:  Improving your sleep habits (sleep hygiene).  Wearing a CPAP mask.  Wearing an oral device at night to improve breathing and reduce snoring.  Taking medicine for: ? Restless legs syndrome. ? Sleep-related seizure disorder. ? Sleep-related movement disorder.  This information is not intended to replace advice given to you by your health care provider. Make sure you discuss any questions you have with your health care provider. Document Released: 08/27/2002 Document Revised: 10/17/2015 Document Reviewed: 04/28/2013 Elsevier Interactive Patient Education  [employer].

## 2017-06-06 ENCOUNTER — Encounter: Payer: Self-pay | Admitting: Cardiovascular Disease

## 2017-06-06 ENCOUNTER — Telehealth: Payer: Self-pay | Admitting: *Deleted

## 2017-06-06 NOTE — Telephone Encounter (Signed)
Patient notified of in lab sleep study appointment. [facility]'s contact information given to patient.

## 2017-06-12 ENCOUNTER — Telehealth: Payer: Self-pay | Admitting: *Deleted

## 2017-06-12 NOTE — Telephone Encounter (Addendum)
Pt contacted pre-catheterization scheduled at [facility] for: Wednesday June 13, 2017 8:30 AM Verified arrival time and place: [place] Entrance A/[place] at: 6:30 AM Nothing to eat or drink after midnight prior to cath. Verified allergies in Epic.  Hold: Metformin 06/12/17, 06/13/17 and 48 hours post cath. Insulin AM of cath Furosemide AM of cath.  Except hold medications AM meds can be  taken pre-cath with sip of water including: ASA 81 mg  Pt encouraged to increase PO water today pre-cath.   Confirmed patient has responsible person to drive home post procedure and observe patient for 24 hours: yes

## 2017-06-13 ENCOUNTER — Ambulatory Visit (HOSPITAL_COMMUNITY)
Admission: RE | Admit: 2017-06-13 | Discharge: 2017-06-13 | Disposition: A | Discharge status: Home / Self Care | Payer: Medicare Other | Source: Ambulatory Visit | Attending: Cardiovascular Disease | Admitting: Cardiovascular Disease

## 2017-06-13 ENCOUNTER — Ambulatory Visit (HOSPITAL_COMMUNITY)
Admission: RE | Disposition: A | Discharge status: Home / Self Care | Payer: Self-pay | Source: Ambulatory Visit | Attending: Cardiovascular Disease

## 2017-06-13 DIAGNOSIS — E119 Type 2 diabetes mellitus without complications: Secondary | ICD-10-CM | POA: Insufficient documentation

## 2017-06-13 DIAGNOSIS — Z7982 Long term (current) use of aspirin: Secondary | ICD-10-CM | POA: Diagnosis not present

## 2017-06-13 DIAGNOSIS — I10 Essential (primary) hypertension: Secondary | ICD-10-CM | POA: Insufficient documentation

## 2017-06-13 DIAGNOSIS — I442 Atrioventricular block, complete: Secondary | ICD-10-CM | POA: Insufficient documentation

## 2017-06-13 DIAGNOSIS — I350 Nonrheumatic aortic (valve) stenosis: Secondary | ICD-10-CM | POA: Diagnosis not present

## 2017-06-13 DIAGNOSIS — Z950 Presence of cardiac pacemaker: Secondary | ICD-10-CM | POA: Diagnosis not present

## 2017-06-13 DIAGNOSIS — Z79899 Other long term (current) drug therapy: Secondary | ICD-10-CM | POA: Insufficient documentation

## 2017-06-13 DIAGNOSIS — Z6841 Body Mass Index (BMI) 40.0 and over, adult: Secondary | ICD-10-CM | POA: Diagnosis not present

## 2017-06-13 DIAGNOSIS — Z87891 Personal history of nicotine dependence: Secondary | ICD-10-CM | POA: Insufficient documentation

## 2017-06-13 DIAGNOSIS — I272 Pulmonary hypertension, unspecified: Secondary | ICD-10-CM | POA: Diagnosis not present

## 2017-06-13 DIAGNOSIS — Z794 Long term (current) use of insulin: Secondary | ICD-10-CM | POA: Insufficient documentation

## 2017-06-13 DIAGNOSIS — M1990 Unspecified osteoarthritis, unspecified site: Secondary | ICD-10-CM | POA: Diagnosis not present

## 2017-06-13 DIAGNOSIS — Z86711 Personal history of pulmonary embolism: Secondary | ICD-10-CM | POA: Diagnosis not present

## 2017-06-13 DIAGNOSIS — Z8543 Personal history of malignant neoplasm of ovary: Secondary | ICD-10-CM | POA: Insufficient documentation

## 2017-06-13 LAB — POCT I-STAT 3, VENOUS BLOOD GAS (G3P V)
BICARBONATE: 25.7 mmol/L (ref 20.0–28.0)
O2 Saturation: 67 %
TCO2: 27 mmol/L (ref 22–32)
pCO2, Ven: 46.5 mmHg (ref 44.0–60.0)
pH, Ven: 7.351 (ref 7.250–7.430)
pO2, Ven: 37 mmHg (ref 32.0–45.0)

## 2017-06-13 LAB — POCT ACTIVATED CLOTTING TIME: Activated Clotting Time: 169 seconds

## 2017-06-13 LAB — GLUCOSE, CAPILLARY
Glucose-Capillary: 120 mg/dL — ABNORMAL HIGH (ref 65–99)
Glucose-Capillary: 123 mg/dL — ABNORMAL HIGH (ref 65–99)

## 2017-06-13 LAB — POCT I-STAT 3, ART BLOOD GAS (G3+)
Acid-base deficit: 1 mmol/L (ref 0.0–2.0)
BICARBONATE: 24.5 mmol/L (ref 20.0–28.0)
O2 SAT: 92 %
PCO2 ART: 42.6 mmHg (ref 32.0–48.0)
TCO2: 26 mmol/L (ref 22–32)
pH, Arterial: 7.368 (ref 7.350–7.450)
pO2, Arterial: 65 mmHg — ABNORMAL LOW (ref 83.0–108.0)

## 2017-06-13 SURGERY — RIGHT HEART CATH AND CORONARY ANGIOGRAPHY
Anesthesia: LOCAL

## 2017-06-13 MED ORDER — MIDAZOLAM HCL 2 MG/2ML IJ SOLN
INTRAMUSCULAR | Status: AC
  Filled 2017-06-13: qty 2

## 2017-06-13 MED ORDER — IOHEXOL 350 MG/ML SOLN
INTRAVENOUS | Status: DC | PRN
  Administered 2017-06-13: 35 mL via INTRA_ARTERIAL

## 2017-06-13 MED ORDER — MIDAZOLAM HCL 2 MG/2ML IJ SOLN
INTRAMUSCULAR | Status: DC | PRN
  Administered 2017-06-13 (×2): 1 mg via INTRAVENOUS

## 2017-06-13 MED ORDER — HEPARIN (PORCINE) IN NACL 2-0.9 UNIT/ML-% IJ SOLN
INTRAMUSCULAR | Status: AC
  Filled 2017-06-13: qty 500

## 2017-06-13 MED ORDER — ONDANSETRON HCL 4 MG/2ML IJ SOLN: 4.0000 mg | Freq: Four times a day (QID) | INTRAMUSCULAR | Status: DC | PRN

## 2017-06-13 MED ORDER — SODIUM CHLORIDE 0.9% FLUSH: 3.0000 mL | Freq: Two times a day (BID) | INTRAVENOUS | Status: DC

## 2017-06-13 MED ORDER — LIDOCAINE HCL (PF) 1 % IJ SOLN
INTRAMUSCULAR | Status: DC | PRN
  Administered 2017-06-13: 1 mL via INTRADERMAL
  Administered 2017-06-13: 12 mL via INTRADERMAL

## 2017-06-13 MED ORDER — SODIUM CHLORIDE 0.9% FLUSH: 3.0000 mL | INTRAVENOUS | Status: DC | PRN

## 2017-06-13 MED ORDER — HEPARIN SODIUM (PORCINE) 1000 UNIT/ML IJ SOLN
INTRAMUSCULAR | Status: AC
  Filled 2017-06-13: qty 1

## 2017-06-13 MED ORDER — ACETAMINOPHEN 325 MG PO TABS: 650.0000 mg | ORAL_TABLET | ORAL | Status: DC | PRN

## 2017-06-13 MED ORDER — HEPARIN SODIUM (PORCINE) 1000 UNIT/ML IJ SOLN
INTRAMUSCULAR | Status: DC | PRN
  Administered 2017-06-13: 5000 [IU] via INTRAVENOUS

## 2017-06-13 MED ORDER — SODIUM CHLORIDE 0.9 % WEIGHT BASED INFUSION: 3.0000 mL/kg/h | INTRAVENOUS | Status: AC

## 2017-06-13 MED ORDER — SODIUM CHLORIDE 0.9 % IV SOLN: 250.0000 mL | INTRAVENOUS | Status: DC | PRN

## 2017-06-13 MED ORDER — ASPIRIN 81 MG PO CHEW: 81.0000 mg | CHEWABLE_TABLET | ORAL | Status: DC

## 2017-06-13 MED ORDER — HEPARIN (PORCINE) IN NACL 2-0.9 UNIT/ML-% IJ SOLN
INTRAMUSCULAR | Status: AC | PRN
  Administered 2017-06-13 (×2): 500 mL via INTRA_ARTERIAL

## 2017-06-13 MED ORDER — SODIUM CHLORIDE 0.9 % WEIGHT BASED INFUSION: 1.0000 mL/kg/h | INTRAVENOUS | Status: DC

## 2017-06-13 MED ORDER — VERAPAMIL HCL 2.5 MG/ML IV SOLN
INTRAVENOUS | Status: DC | PRN
  Administered 2017-06-13: 10 mL via INTRA_ARTERIAL

## 2017-06-13 MED ORDER — FENTANYL CITRATE (PF) 100 MCG/2ML IJ SOLN
INTRAMUSCULAR | Status: AC
  Filled 2017-06-13: qty 2

## 2017-06-13 MED ORDER — VERAPAMIL HCL 2.5 MG/ML IV SOLN
INTRAVENOUS | Status: AC
  Filled 2017-06-13: qty 2

## 2017-06-13 MED ORDER — FENTANYL CITRATE (PF) 100 MCG/2ML IJ SOLN
INTRAMUSCULAR | Status: DC | PRN
  Administered 2017-06-13 (×2): 25 ug via INTRAVENOUS

## 2017-06-13 MED ORDER — SODIUM CHLORIDE 0.9 % WEIGHT BASED INFUSION
1.0000 mL/kg/h | INTRAVENOUS | Status: DC
  Administered 2017-06-13: 0.402 mL/kg/h via INTRAVENOUS

## 2017-06-13 SURGICAL SUPPLY — 16 items
BAND CMPR LRG ZPHR (HEMOSTASIS) ×1
BAND ZEPHYR COMPRESS 30 LONG (HEMOSTASIS) ×1 IMPLANT
CATH IMPULSE 5F ANG/FL3.5 (CATHETERS) ×1 IMPLANT
CATH SWAN GANZ 7F STRAIGHT (CATHETERS) ×1 IMPLANT
COVER PRB 48X5XTLSCP FOLD TPE (BAG) IMPLANT
COVER PROBE 5X48 (BAG) ×4
GUIDEWIRE INQWIRE 1.5J.035X260 (WIRE) IMPLANT
INQWIRE 1.5J .035X260CM (WIRE) ×2
KIT HEART LEFT (KITS) ×2 IMPLANT
NDL PERC 21GX4CM (NEEDLE) IMPLANT
NEEDLE PERC 21GX4CM (NEEDLE) ×2 IMPLANT
PACK CARDIAC CATHETERIZATION (CUSTOM PROCEDURE TRAY) ×2 IMPLANT
SHEATH AVANTI 11CM 7FR (SHEATH) ×1 IMPLANT
SHEATH RAIN RADIAL 21G 6FR (SHEATH) ×1 IMPLANT
TRANSDUCER W/STOPCOCK (MISCELLANEOUS) ×2 IMPLANT
TUBING CIL FLEX 10 FLL-RA (TUBING) ×2 IMPLANT

## 2017-06-13 NOTE — Interval H&P Note (Signed)
History and Physical Interval Note:  06/13/2017 8:12 AM  [name]  has presented today for surgery, with the diagnosis of as  The various methods of treatment have been discussed with the patient and family. After consideration of risks, benefits and other options for treatment, the patient has consented to  Procedure(s): RIGHT/LEFT HEART CATH AND CORONARY ANGIOGRAPHY (N/A) as a surgical intervention .  The patient's history has been reviewed, patient examined, no change in status, stable for surgery.  I have reviewed the patient's chart and labs.  Questions were answered to the patient's satisfaction.     Sherren Mocha

## 2017-06-13 NOTE — Discharge Instructions (Signed)
Radial Site Care °Refer to this sheet in the next few weeks. These instructions provide you with information about caring for yourself after your procedure. Your health care provider may also give you more specific instructions. Your treatment has been planned according to current medical practices, but problems sometimes occur. Call your health care provider if you have any problems or questions after your procedure. °What can I expect after the procedure? °After your procedure, it is typical to have the following: °· Bruising at the radial site that usually fades within 1-2 weeks. °· Blood collecting in the tissue (hematoma) that may be painful to the touch. It should usually decrease in size and tenderness within 1-2 weeks. ° °Follow these instructions at home: °· Take medicines only as directed by your health care provider. °· You may shower 24-48 hours after the procedure or as directed by your health care provider. Remove the bandage (dressing) and gently wash the site with plain soap and water. Pat the area dry with a clean towel. Do not rub the site, because this may cause bleeding. °· Do not take baths, swim, or use a hot tub until your health care provider approves. °· Check your insertion site every day for redness, swelling, or drainage. °· Do not apply powder or lotion to the site. °· Do not flex or bend the affected arm for 24 hours or as directed by your health care provider. °· Do not push or pull heavy objects with the affected arm for 24 hours or as directed by your health care provider. °· Do not lift over 10 lb (4.5 kg) for 5 days after your procedure or as directed by your health care provider. °· Ask your health care provider when it is okay to: °? Return to work or school. °? Resume usual physical activities or sports. °? Resume sexual activity. °· Do not drive home if you are discharged the same day as the procedure. Have someone else drive you. °· You may drive 24 hours after the procedure  unless otherwise instructed by your health care provider. °· Do not operate machinery or power tools for 24 hours after the procedure. °· If your procedure was done as an outpatient procedure, which means that you went home the same day as your procedure, a responsible adult should be with you for the first 24 hours after you arrive home. °· Keep all follow-up visits as directed by your health care provider. This is important. °Contact a health care provider if: °· You have a fever. °· You have chills. °· You have increased bleeding from the radial site. Hold pressure on the site. °Get help right away if: °· You have unusual pain at the radial site. °· You have redness, warmth, or swelling at the radial site. °· You have drainage (other than a small amount of blood on the dressing) from the radial site. °· The radial site is bleeding, and the bleeding does not stop after 30 minutes of holding steady pressure on the site. °· Your arm or hand becomes pale, cool, tingly, or numb. °This information is not intended to replace advice given to you by your health care provider. Make sure you discuss any questions you have with your health care provider. °Document Released: 03/25/2010 Document Revised: 07/29/2015 Document Reviewed: 09/08/2013 °Elsevier Interactive Patient Education © 2018 Else[employer] Inc[employer] °Femoral Site Care °Refer to this sheet in the next few weeks. These instructions provide you with information about caring for yourself after your procedure. Your   health care provider may also give you more specific instructions. Your treatment has been planned according to current medical practices, but problems sometimes occur. Call your health care provider if you have any problems or questions after your procedure. °What can I expect after the procedure? °After your procedure, it is typical to have the following: °· Bruising at the site that usually fades within 1-2 weeks. °· Blood collecting in the tissue (hematoma) that  may be painful to the touch. It should usually decrease in size and tenderness within 1-2 weeks. ° °Follow these instructions at home: °· Take medicines only as directed by your health care provider. °· You may shower 24-48 hours after the procedure or as directed by your health care provider. Remove the bandage (dressing) and gently wash the site with plain soap and water. Pat the area dry with a clean towel. Do not rub the site, because this may cause bleeding. °· Do not take baths, swim, or use a hot tub until your health care provider approves. °· Check your insertion site every day for redness, swelling, or drainage. °· Do not apply powder or lotion to the site. °· Limit use of stairs to twice a day for the first 2-3 days or as directed by your health care provider. °· Do not squat for the first 2-3 days or as directed by your health care provider. °· Do not lift over 10 lb (4.5 kg) for 5 days after your procedure or as directed by your health care provider. °· Ask your health care provider when it is okay to: °? Return to work or school. °? Resume usual physical activities or sports. °? Resume sexual activity. °· Do not drive home if you are discharged the same day as the procedure. Have someone else drive you. °· You may drive 24 hours after the procedure unless otherwise instructed by your health care provider. °· Do not operate machinery or power tools for 24 hours after the procedure or as directed by your health care provider. °· If your procedure was done as an outpatient procedure, which means that you went home the same day as your procedure, a responsible adult should be with you for the first 24 hours after you arrive home. °· Keep all follow-up visits as directed by your health care provider. This is important. °Contact a health care provider if: °· You have a fever. °· You have chills. °· You have increased bleeding from the site. Hold pressure on the site. °Get help right away if: °· You have  unusual pain at the site. °· You have redness, warmth, or swelling at the site. °· You have drainage (other than a small amount of blood on the dressing) from the site. °· The site is bleeding, and the bleeding does not stop after 30 minutes of holding steady pressure on the site. °· Your leg or foot becomes pale, cool, tingly, or numb. °This information is not intended to replace advice given to you by your health care provider. Make sure you discuss any questions you have with your health care provider. °Document Released: 10/24/2013 Document Revised: 07/29/2015 Document Reviewed: 09/09/2013 °Elsevier Interactive Patient Education © 2018 Elsevier Inc. ° °

## 2017-06-13 NOTE — Progress Notes (Signed)
Unable to establish an IV in left arm/ 3 misses, cath lab aware and will get left arm IV.

## 2017-06-13 NOTE — Progress Notes (Signed)
Site area: rt groin fv sheath Site Prior to Removal:  Level 0 Pressure Applied For: 10 minutes Manual:   yes Patient Status During Pull:  stable Post Pull Site:  Level 0 Post Pull Instructions Given:  yes Post Pull Pulses Present: palpable Dressing Applied:  Gauze and tegaderm Bedrest begins @ 0940 Comments:

## 2017-06-14 ENCOUNTER — Other Ambulatory Visit: Payer: Self-pay

## 2017-06-14 DIAGNOSIS — I350 Nonrheumatic aortic (valve) stenosis: Secondary | ICD-10-CM

## 2017-06-14 DIAGNOSIS — N289 Disorder of kidney and ureter, unspecified: Secondary | ICD-10-CM

## 2017-06-14 MED FILL — Heparin Sodium (Porcine) 2 Unit/ML in Sodium Chloride 0.9%: INTRAMUSCULAR | Qty: 1000 | Status: AC

## 2017-06-20 ENCOUNTER — Ambulatory Visit (HOSPITAL_COMMUNITY)
Admission: RE | Admit: 2017-06-20 | Discharge: 2017-06-20 | Disposition: A | Discharge status: Home / Self Care | Payer: Medicare Other | Source: Ambulatory Visit | Attending: Cardiovascular Disease | Admitting: Cardiovascular Disease

## 2017-06-20 ENCOUNTER — Encounter (HOSPITAL_COMMUNITY): Payer: Self-pay

## 2017-06-20 DIAGNOSIS — J449 Chronic obstructive pulmonary disease, unspecified: Secondary | ICD-10-CM | POA: Diagnosis not present

## 2017-06-20 DIAGNOSIS — I350 Nonrheumatic aortic (valve) stenosis: Secondary | ICD-10-CM | POA: Diagnosis not present

## 2017-06-20 DIAGNOSIS — I7789 Other specified disorders of arteries and arterioles: Secondary | ICD-10-CM | POA: Diagnosis not present

## 2017-06-20 DIAGNOSIS — N200 Calculus of kidney: Secondary | ICD-10-CM | POA: Diagnosis not present

## 2017-06-20 DIAGNOSIS — I700 Atherosclerosis of aorta: Secondary | ICD-10-CM | POA: Diagnosis not present

## 2017-06-20 DIAGNOSIS — K5730 Diverticulosis of large intestine without perforation or abscess without bleeding: Secondary | ICD-10-CM | POA: Diagnosis not present

## 2017-06-20 DIAGNOSIS — K8020 Calculus of gallbladder without cholecystitis without obstruction: Secondary | ICD-10-CM | POA: Diagnosis not present

## 2017-06-20 DIAGNOSIS — I289 Disease of pulmonary vessels, unspecified: Secondary | ICD-10-CM | POA: Diagnosis not present

## 2017-06-20 DIAGNOSIS — N289 Disorder of kidney and ureter, unspecified: Secondary | ICD-10-CM | POA: Diagnosis not present

## 2017-06-20 LAB — PULMONARY FUNCTION TEST
DL/VA % PRED: 81 %
DL/VA: 4.22 ml/min/mmHg/L
DLCO UNC % PRED: 52 %
DLCO UNC: 14.72 ml/min/mmHg
FEF 25-75 POST: 2.68 L/s
FEF 25-75 PRE: 2.2 L/s
FEF2575-%Change-Post: 21 %
FEF2575-%PRED-PRE: 139 %
FEF2575-%Pred-Post: 169 %
FEV1-%Change-Post: 1 %
FEV1-%PRED-POST: 87 %
FEV1-%Pred-Pre: 86 %
FEV1-Post: 1.94 L
FEV1-Pre: 1.92 L
FEV1FVC-%Change-Post: 1 %
FEV1FVC-%Pred-Pre: 116 %
FEV6-%CHANGE-POST: 0 %
FEV6-%PRED-POST: 78 %
FEV6-%PRED-PRE: 78 %
FEV6-POST: 2.21 L
FEV6-Pre: 2.23 L
FEV6FVC-%PRED-POST: 105 %
FEV6FVC-%PRED-PRE: 105 %
FVC-%Change-Post: 0 %
FVC-%Pred-Post: 74 %
FVC-%Pred-Pre: 74 %
FVC-Post: 2.21 L
FVC-Pre: 2.23 L
POST FEV6/FVC RATIO: 100 %
PRE FEV1/FVC RATIO: 86 %
Post FEV1/FVC ratio: 88 %
Pre FEV6/FVC Ratio: 100 %
RV % pred: 80 %
RV: 2.05 L
TLC % PRED: 78 %
TLC: 4.29 L

## 2017-06-20 LAB — BASIC METABOLIC PANEL
Anion gap: 11 (ref 5–15)
BUN: 30 mg/dL — AB (ref 6–20)
CHLORIDE: 103 mmol/L (ref 101–111)
CO2: 26 mmol/L (ref 22–32)
Calcium: 9.2 mg/dL (ref 8.9–10.3)
Creatinine, Ser: 1.05 mg/dL — ABNORMAL HIGH (ref 0.44–1.00)
GFR, EST AFRICAN AMERICAN: 57 mL/min — AB (ref >60)
GFR, EST NON AFRICAN AMERICAN: 49 mL/min — AB (ref >60)
Glucose, Bld: 126 mg/dL — ABNORMAL HIGH (ref 65–99)
POTASSIUM: 4.2 mmol/L (ref 3.5–5.1)
Sodium: 140 mmol/L (ref 135–145)

## 2017-06-20 MED ORDER — IOPAMIDOL (ISOVUE-370) INJECTION 76%
100.0000 mL | Freq: Once | INTRAVENOUS | Status: AC | PRN
  Administered 2017-06-20: 100 mL via INTRAVENOUS

## 2017-06-20 MED ORDER — SODIUM BICARBONATE 8.4 % IV SOLN
INTRAVENOUS | Status: DC
  Filled 2017-06-20 (×2): qty 500

## 2017-06-20 MED ORDER — SODIUM BICARBONATE BOLUS VIA INFUSION
INTRAVENOUS | Status: AC
  Administered 2017-06-20: 300 meq via INTRAVENOUS
  Filled 2017-06-20: qty 1

## 2017-06-20 MED ORDER — IOPAMIDOL (ISOVUE-370) INJECTION 76%
INTRAVENOUS | Status: AC
  Administered 2017-06-20: 100 mL
  Filled 2017-06-20: qty 100

## 2017-06-20 MED ORDER — ALBUTEROL SULFATE (2.5 MG/3ML) 0.083% IN NEBU
2.5000 mg | INHALATION_SOLUTION | Freq: Once | RESPIRATORY_TRACT | Status: AC
  Administered 2017-06-20: 2.5 mg via RESPIRATORY_TRACT

## 2017-06-26 ENCOUNTER — Encounter (HOSPITAL_BASED_OUTPATIENT_CLINIC_OR_DEPARTMENT_OTHER): Payer: Medicare Other

## 2017-07-02 ENCOUNTER — Ambulatory Visit: Payer: Medicare Other | Attending: Cardiovascular Disease | Admitting: Physical Therapy

## 2017-07-02 ENCOUNTER — Encounter: Payer: Self-pay | Admitting: Thoracic Surgery (Cardiothoracic Vascular Surgery)

## 2017-07-02 ENCOUNTER — Encounter: Payer: Self-pay | Admitting: Physical Therapy

## 2017-07-02 ENCOUNTER — Other Ambulatory Visit: Payer: Self-pay

## 2017-07-02 ENCOUNTER — Institutional Professional Consult (permissible substitution) (INDEPENDENT_AMBULATORY_CARE_PROVIDER_SITE_OTHER): Payer: Medicare Other | Admitting: Thoracic Surgery (Cardiothoracic Vascular Surgery)

## 2017-07-02 VITALS — BP 123/62 | HR 69 | Resp 16 | Ht 67.0 in | Wt 274.0 lb

## 2017-07-02 DIAGNOSIS — G8929 Other chronic pain: Secondary | ICD-10-CM | POA: Diagnosis not present

## 2017-07-02 DIAGNOSIS — I350 Nonrheumatic aortic (valve) stenosis: Secondary | ICD-10-CM

## 2017-07-02 DIAGNOSIS — R2689 Other abnormalities of gait and mobility: Secondary | ICD-10-CM | POA: Diagnosis not present

## 2017-07-02 DIAGNOSIS — M25561 Pain in right knee: Secondary | ICD-10-CM | POA: Insufficient documentation

## 2017-07-02 DIAGNOSIS — M25562 Pain in left knee: Secondary | ICD-10-CM | POA: Diagnosis not present

## 2017-07-02 NOTE — Progress Notes (Signed)
HEART AND [place] SURGERY CONSULTATION REPORT  Referring Provider is Skeet Latch, MD PCP is Leighton Ruff, MD  Chief Complaint  Patient presents with  . Aortic Stenosis    Surgical eval for TAVR, review all studies     HPI:  Patient is an 82 year old obese female with history of aortic stenosis, complete heart block status post permanent pacemaker placement, hypertension, chronic diastolic congestive heart failure, type 2 diabetes mellitus, ovarian cancer, breast cancer, spinal stenosis, degenerative arthritis, and decreased physical mobility with been referred for surgical consultation to discuss treatment options for management of severe symptomatic aortic stenosis.  The patient's cardiac history dates back to February 2018 when she was hospitalized with worsening shortness of breath and dizzy spells.  She was in complete heart block at the time and underwent permanent pacemaker placement.  Transthoracic echocardiogram performed at that time revealed the presence of normal left ventricular systolic function with moderate aortic stenosis.  Since then she has been followed regularly by Dr. Oval Linsey.  Follow-up echocardiogram performed significant progression and severity of aortic stenosis.  Peak velocity across aortic valve was measured 4.8 m/s corresponding mean transvalvular gradient estimated 62 mmHg.  The DVI was reported 0.21.  Left ventricular systolic function remain normal with ejection fraction estimated 55 to 60%.  She was also felt to have mild mitral stenosis and mild to moderate mitral regurgitation with mean transvalvular gradient across the mitral valve estimated 4 mmHg and mitral ERO measured 0.1 cm corresponding to a regurgitant volume of 21 mL.  She was referred to the multidisciplinary heart valve clinic and underwent diagnostic cardiac catheterization by Dr. Burt Knack on June 13, 2017.  She was found to  have widely patent coronary arteries with no significant coronary artery disease.  There was mild pulmonary hypertension with preserved cardiac output.  CT angiography was performed and the patient was referred for surgical consultation.  The patient is married and lives locally in [place] with her husband.  She has been retired for more than 30 years, having previously worked for [employer].  She lives a very sedentary lifestyle.  She is limited considerably because of spinal stenosis as well as severe degenerative arthritis involving both hips and both knees.  She ambulates using a rolling walker which is necessary even for short distances within her own home.  She has been obese for much of her adult life.  She describes stable symptoms of exertional shortness of breath and fatigue.  She has severe chronic bilateral lower extremity edema.  She denies any history of resting shortness of breath, PND, orthopnea, or lower extremity edema.  She has not had any dizzy spells since she underwent pacemaker placement in 2018.    Past Medical History:  Diagnosis Date  . Aortic stenosis   . Arthritis   . Breast cancer [facility])    CANCER  . Diabetes mellitus   . Hypertension   . Mitral stenosis   . Morbid obesity (HCC)    BMI > 40  . Ovarian cancer ([place])   . Pulmonary embolism ([place]) 2013   post op  . Spinal stenosis     Past Surgical History:  Procedure Laterality Date  . ABDOMINAL HYSTERECTOMY  2013  . BREAST SURGERY  03-2004   lumpectomy   . PACEMAKER IMPLANT N/A 04/21/2016   Procedure: Pacemaker Implant;  Surgeon: Will Meredith Leeds, MD;  Location: [facility];  Service: Cardiovascular;  Laterality: N/A;    Family  History  Problem Relation Age of Onset  . Cancer Father        BLADDER  . Cancer Brother   . Cancer Sister     Social History   Socioeconomic History  . Marital status: Married    Spouse name: Not on file  . Number of children: Not on file  . Years of  education: Not on file  . Highest education level: Not on file  Occupational History  . Not on file  Social Needs  . Financial resource strain: Not on file  . Food insecurity:    Worry: Not on file    Inability: Not on file  . Transportation needs:    Medical: Not on file    Non-medical: Not on file  Tobacco Use  . Smoking status: Former Smoker    Packs/day: 1.00    Years: 5.00    Pack years: 5.00    Types: Cigarettes  . Smokeless tobacco: Never Used  Substance and Sexual Activity  . Alcohol use: No  . Drug use: No  . Sexual activity: Not on file  Lifestyle  . Physical activity:    Days per week: Not on file    Minutes per session: Not on file  . Stress: Not on file  Relationships  . Social connections:    Talks on phone: Not on file    Gets together: Not on file    Attends religious service: Not on file    Active member of club or organization: Not on file    Attends meetings of clubs or organizations: Not on file    Relationship status: Not on file  . Intimate partner violence:    Fear of current or ex partner: Not on file    Emotionally abused: Not on file    Physically abused: Not on file    Forced sexual activity: Not on file  Other Topics Concern  . Not on file  Social History Narrative  . Not on file    Current Outpatient Medications  Medication Sig Dispense Refill  . acetaminophen (TYLENOL) 650 MG CR tablet Take 1,300 mg by mouth every 8 (eight) hours as needed for pain.    [name] Kitchen aspirin 81 MG tablet Take 81 mg by mouth daily.      [name] Kitchen atorvastatin (LIPITOR) 20 MG tablet Take 20 mg by mouth every evening.      . Calcium Citrate-Vitamin D (CALCIUM CITRATE + PO) Take 1 tablet by mouth 2 (two) times daily.     . cholecalciferol (VITAMIN D) 1000 units tablet Take 1,000 Units by mouth 2 (two) times daily.     . fish oil-omega-3 fatty acids 1000 MG capsule Take 1 g by mouth every evening.     [name] Kitchen FLUoxetine (PROZAC) 40 MG capsule Take 40 mg by mouth every morning.       . furosemide (LASIX) 40 MG tablet Take 60 mg by mouth daily.     [name] Kitchen LEVEMIR FLEXTOUCH 100 UNIT/ML Pen Inject 60-70 Units into the skin 2 (two) times daily. Per sliding scale    . Magnesium 400 MG CAPS Take 400 mg by mouth every evening.     . metFORMIN (GLUCOPHAGE) 1000 MG tablet Take 1,000 mg by mouth 2 (two) times daily with a meal.     . metoprolol tartrate (LOPRESSOR) 25 MG tablet Take 12.5 mg by mouth 2 (two) times daily.     . Multiple Vitamin (MULTIVITAMIN) tablet Take 1 tablet by mouth daily.      [name] Kitchen  olmesartan (BENICAR) 20 MG tablet Take 10 mg by mouth every evening.     No current facility-administered medications for this visit.     Allergies  Allergen Reactions  . Other Rash and Other (See Comments)    CLEAR PLASTIC TAPE      Review of Systems:   General:  normal appetite, stable energy, no weight gain, no weight loss, no fever  Cardiac:  no chest pain with exertion, no chest pain at rest, +SOB with exertion, no resting SOB, no PND, no orthopnea, no palpitations, no arrhythmia, no atrial fibrillation, + LE edema, no dizzy spells, no syncope  Respiratory:  + shortness of breath, no home oxygen, no productive cough, no dry cough, no bronchitis, no wheezing, no hemoptysis, no asthma, no pain with inspiration or cough, no sleep apnea, no CPAP at night  GI:   no difficulty swallowing, no reflux, no frequent heartburn, no hiatal hernia, no abdominal pain, no constipation, no diarrhea, no hematochezia, no hematemesis, no melena  GU:   no dysuria,  + frequency, no urinary tract infection, no hematuria, no kidney stones, no kidney disease  Vascular:  no pain suggestive of claudication, no pain in feet, no leg cramps, no varicose veins, no DVT, no non-healing foot ulcer  Neuro:   no stroke, no TIA's, no seizures, no headaches, no temporary blindness one eye,  no slurred speech, no peripheral neuropathy, + chronic pain, + instability of gait, no memory/cognitive  dysfunction  Musculoskeletal: + arthritis, + joint swelling, + myalgias, + difficulty walking, limited mobility   Skin:   no rash, no itching, no skin infections, no pressure sores or ulcerations  Psych:   + anxiety, no depression, no nervousness, no unusual recent stress  Eyes:   no blurry vision, no floaters, no recent vision changes, does not wears glasses or contacts  ENT:   no hearing loss, no loose or painful teeth, + dentures, last saw dentist March 2019  Hematologic:  no easy bruising, no abnormal bleeding, no clotting disorder, no frequent epistaxis  Endocrine:  + diabetes, does not check CBG's at home           Physical Exam:   BP 123/62 (BP Location: Left Arm, Patient Position: Sitting, Cuff Size: Large)   Pulse 69   Resp 16   Ht 5\' 7"  (1.702 m)   Wt 274 lb (124.3 kg)   SpO2 96% Comment: RA  BMI 42.91 kg/m   General:  Morbidly obese with limited mobility, o/w  well-appearing  HEENT:  Unremarkable   Neck:   no JVD, no bruits, no adenopathy   Chest:   clear to auscultation, symmetrical breath sounds, no wheezes, no rhonchi   CV:   RRR, grade IV/VI crescendo/decrescendo murmur heard best at RSB, no diastolic murmur  Abdomen:  soft, non-tender, no masses   Extremities:  warm, well-perfused, pulses palpable but diminished, + bilateral LE edema  Rectal/GU  Deferred  Neuro:   Grossly non-focal and symmetrical throughout  Skin:   Clean and dry, no rashes, no breakdown   Diagnostic Tests:  Transthoracic Echocardiography  (Report amended )  Patient:    [name], [name] MR #:       [id number] Study Date: 05/14/2017 Gender:     F Age:        19 Height:     171.5 cm Weight:     124.3 kg BSA:        2.49 m^2 Pt. Status: Room:   REFERRING  Leighton Ruff [id number]  SONOGRAPHER  Marygrace Drought, RCS  PERFORMING   [place], Outpatient  ATTENDING    Skeet Latch, MD  ORDERING     Skeet Latch, MD  [place],  MD  cc:  ------------------------------------------------------------------- LV EF: 55% -   60%  ------------------------------------------------------------------- Indications:      Aortic Valve Disorder (I35.0).  ------------------------------------------------------------------- History:   PMH:   Mitral valve disease.  Risk factors: Hypertension. Diabetes mellitus.  ------------------------------------------------------------------- Study Conclusions  - Left ventricle: The cavity size was normal. There was moderate   concentric hypertrophy. Systolic function was normal. The   estimated ejection fraction was in the range of 55% to 60%. Wall   motion was normal; there were no regional wall motion   abnormalities. Doppler parameters are consistent with abnormal   left ventricular relaxation (grade 1 diastolic dysfunction).   Doppler parameters are consistent with high ventricular filling   pressure. - Aortic valve: Probably trileaflet; severely thickened, severely   calcified leaflets. Valve mobility was restricted. There was   critical stenosis. There was no regurgitation. Peak velocity (S):   478 cm/s. Mean gradient (S): 62 mm Hg. Valve area (VTI): 0.57   cm^2. Valve area (Vmax): 0.65 cm^2. Valve area (Vmean): 0.51   cm^2. - Mitral valve: Severely calcified annulus. Mobility was   restricted. The findings are consistent with mild stenosis. There   was mild regurgitation. Valve area by pressure half-time: 1.93   cm^2. Valve area by continuity equation (using LVOT flow): 1.43   cm^2. - Left atrium: The atrium was severely dilated. - Right ventricle: The cavity size was normal. Wall thickness was   normal. Systolic function was normal. - Tricuspid valve: There was mild regurgitation. - Pulmonary arteries: Systolic pressure was mildly increased. PA   peak pressure: 44 mm Hg (S).  ------------------------------------------------------------------- Study data:   Comparison was made to the study of 04/21/2016.  Study status:  Routine.  Procedure:  The patient reported no pain pre or post test. Transthoracic echocardiography. Image quality was adequate.          Transthoracic echocardiography.  M-mode, complete 2D, spectral Doppler, and color Doppler.  Birthdate: Patient birthdate: [birth year].  Age:  Patient is 82 yr old.  Sex: Gender: female.    BMI: 42.3 kg/m^2.  Blood pressure:     130/78 Patient status:  Outpatient.  Study date:  Study date: 05/14/2017. Study time: 11:29 AM.  Location:  [facility]  -------------------------------------------------------------------  ------------------------------------------------------------------- Left ventricle:  The cavity size was normal. There was moderate concentric hypertrophy. Systolic function was normal. The estimated ejection fraction was in the range of 55% to 60%. Wall motion was normal; there were no regional wall motion abnormalities. Doppler parameters are consistent with abnormal left ventricular relaxation (grade 1 diastolic dysfunction). Doppler parameters are consistent with high ventricular filling pressure.  ------------------------------------------------------------------- Aortic valve:  Poorly visualized.  Probably trileaflet; severely thickened, severely calcified leaflets. Valve mobility was restricted.  Doppler:   There was critical stenosis.   There was no regurgitation.    VTI ratio of LVOT to aortic valve: 0.18. Valve area (VTI): 0.57 cm^2. Indexed valve area (VTI): 0.23 cm^2/m^2. Peak velocity ratio of LVOT to aortic valve: 0.21. Valve area (Vmax): 0.65 cm^2. Indexed valve area (Vmax): 0.26 cm^2/m^2. Mean velocity ratio of LVOT to aortic valve: 0.16. Valve area (Vmean): 0.51 cm^2. Indexed valve area (Vmean): 0.21 cm^2/m^2.    Mean gradient (S): 62 mm Hg. Peak gradient (S): 92 mm  Hg.  ------------------------------------------------------------------- Aorta:   Aortic root: The aortic root was normal in size.  ------------------------------------------------------------------- Mitral valve:   Severely calcified annulus. Mobility was restricted.  Doppler:   The findings are consistent with mild stenosis.   There was mild regurgitation.    Valve area by pressure half-time: 1.93 cm^2. Indexed valve area by pressure half-time: 0.77 cm^2/m^2. Valve area by continuity equation (using LVOT flow): 1.43 cm^2. Indexed valve area by continuity equation (using LVOT flow): 0.57 cm^2/m^2.    Mean gradient (D): 4 mm Hg. Peak gradient (D): 7 mm Hg.  ------------------------------------------------------------------- Left atrium:  The atrium was severely dilated.  ------------------------------------------------------------------- Right ventricle:  The cavity size was normal. Wall thickness was normal. Systolic function was normal.  ------------------------------------------------------------------- Pulmonic valve:    Doppler:  Transvalvular velocity was within the normal range. There was no evidence for stenosis.  ------------------------------------------------------------------- Tricuspid valve:   Structurally normal valve.    Doppler: Transvalvular velocity was within the normal range. There was mild regurgitation.  ------------------------------------------------------------------- Pulmonary artery:   The main pulmonary artery was normal-sized. Systolic pressure was mildly increased.  ------------------------------------------------------------------- Right atrium:  The atrium was normal in size.  ------------------------------------------------------------------- Pericardium:  There was no pericardial effusion.  ------------------------------------------------------------------- Systemic veins: Inferior vena cava: The vessel was normal in size. The respirophasic diameter changes were in the normal range (= 50%), consistent with  normal central venous pressure. Diameter: 21 mm.  ------------------------------------------------------------------- Measurements   IVC                                      Value          Reference  ID                                       21    mm       ----------    Left ventricle                           Value          Reference  LV ID, ED, PLAX chordal                  49.4  mm       43 - 52  LV ID, ES, PLAX chordal                  36.4  mm       23 - 38  LV fx shortening, PLAX chordal   (L)     26    %        >=29  LV PW thickness, ED                      12.46 mm       ----------  IVS/LV PW ratio, ED                      1.12           <=1.3  Stroke volume, 2D                        80    ml       ----------  Stroke volume/bsa, 2D                    32    ml/m^2   ----------  LV e&', lateral                           5     cm/s     ----------  LV E/e&', lateral                         27.2           ----------  LV e&', medial                            5.22  cm/s     ----------  LV E/e&', medial                          26.05          ----------  LV e&', average                           5.11  cm/s     ----------  LV E/e&', average                         26.61          ----------    Ventricular septum                       Value          Reference  IVS thickness, ED                        13.97 mm       ----------    LVOT                                     Value          Reference  LVOT ID, S                               20    mm       ----------  LVOT area                                3.14  cm^2     ----------  LVOT peak velocity, S                    99    cm/s     ----------  LVOT mean velocity, S                    61.5  cm/s     ----------  LVOT VTI, S                              25.6  cm       ----------    Aortic valve  Value          Reference  Aortic valve peak velocity, S            478   cm/s     ----------  Aortic valve mean  velocity, S            376   cm/s     ----------  Aortic valve VTI, S                      140   cm       ----------  Aortic mean gradient, S                  62    mm Hg    ----------  Aortic peak gradient, S                  92    mm Hg    ----------  VTI ratio, LVOT/AV                       0.18           ----------  Aortic valve area, VTI                   0.57  cm^2     ----------  Aortic valve area/bsa, VTI               0.23  cm^2/m^2 ----------  Velocity ratio, peak, LVOT/AV            0.21           ----------  Aortic valve area, peak velocity         0.65  cm^2     ----------  Aortic valve area/bsa, peak              0.26  cm^2/m^2 ----------  velocity  Velocity ratio, mean, LVOT/AV            0.16           ----------  Aortic valve area, mean velocity         0.51  cm^2     ----------  Aortic valve area/bsa, mean              0.21  cm^2/m^2 ----------  velocity  Aortic regurg pressure half-time         751   ms       ----------    Aorta                                    Value          Reference  Aortic root ID, ED                       31    mm       ----------    Left atrium                              Value          Reference  LA ID, A-P, ES                           43    mm       ----------  LA ID/bsa, A-P                           1.72  cm/m^2   <=2.2  LA volume, S                             119   ml       ----------  LA volume/bsa, S                         47.7  ml/m^2   ----------  LA volume, ES, 1-p A4C                   105   ml       ----------  LA volume/bsa, ES, 1-p A4C               42.1  ml/m^2   ----------  LA volume, ES, 1-p A2C                   136   ml       ----------  LA volume/bsa, ES, 1-p A2C               54.5  ml/m^2   ----------    Mitral valve                             Value          Reference  Mitral E-wave peak velocity              136   cm/s     ----------  Mitral A-wave peak velocity              153   cm/s     ----------  Mitral mean  velocity, D                  89.2  cm/s     ----------  Mitral deceleration time         (H)     398   ms       150 - 230  Mitral pressure half-time                115   ms       ----------  Mitral mean gradient, D                  4     mm Hg    ----------  Mitral peak gradient, D                  7     mm Hg    ----------  Mitral E/A ratio, peak                   0.9            ----------  Mitral valve area, PHT, DP               1.93  cm^2     ----------  Mitral valve area/bsa, PHT, DP           0.77  cm^2/m^2 ----------  Mitral valve area, LVOT                  1.43  cm^2     ----------  continuity  Mitral valve area/bsa, LVOT              0.57  cm^2/m^2 ----------  continuity  Mitral annulus VTI, D                    56.1  cm       ----------  Mitral regurg VTI, PISA                  209   cm       ----------  Mitral ERO, PISA                         0.1   cm^2     ----------  Mitral regurg volume, PISA               21    ml       ----------    Pulmonary arteries                       Value          Reference  PA pressure, S, DP               (H)     44    mm Hg    <=30    Tricuspid valve                          Value          Reference  Tricuspid regurg peak velocity           300   cm/s     ----------  Tricuspid peak RV-RA gradient            36    mm Hg    ----------  Tricuspid maximal regurg                 300   cm/s     ----------  velocity, PISA    Right atrium                             Value          Reference  RA ID, S-I, ES, A4C              (H)     57.3  mm       34 - 49  RA area, ES, A4C                 (H)     20.6  cm^2     8.3 - 19.5  RA volume, ES, A/L                       59.9  ml       ----------  RA volume/bsa, ES, A/L                   24    ml/m^2   ----------    Systemic veins                           Value          Reference  Estimated CVP  8     mm Hg    ----------    Right ventricle                          Value           Reference  TAPSE                                    28.5  mm       ----------  RV pressure, S, DP               (H)     44    mm Hg    <=30  RV s&', lateral, S                        17.3  cm/s     ----------  Legend: (L)  and  (H)  mark values outside specified reference range.  ------------------------------------------------------------------- Neena Rhymes, MD [id number]:50:38    RIGHT HEART CATH AND CORONARY ANGIOGRAPHY  Conclusion   1. Widely patent coronary arteries without obstructive CAD 2. Severely calcified aortic valve with restricted motion, known severe AS by echo 3. Severely calcified mitral valve annulus 4. Mild pulmonary HTN, elevated RA pressure, preserved cardiac output  Plan: continue multidisciplinary evaluation for treatment of severe aortic stenosis, next steps CTA studies and cardiac surgical evaluation  Indications   Severe aortic stenosis [I35.0 (ICD-10-CM)]  Procedural Details/Technique   Technical Details INDICATION: Severe symptomatic aortic stenosis  PROCEDURAL DETAILS: Ultrasound guidance was used to access the right femoral vein and a 7 Fr sheath is placed. [place] images are captured and stored in the patient's paper chart. The right wrist was then prepped, draped, and anesthetized with 1% lidocaine. Using the modified Seldinger technique a 5/6 French Slender sheath was placed in the right radial artery. Intra-arterial verapamil was administered through the radial artery sheath. IV heparin was administered after a JR4 catheter was advanced into the central aorta. A Swan-Ganz catheter was used for the right heart catheterization. Standard protocol was followed for recording of right heart pressures and sampling of oxygen saturations. Fick cardiac output was calculated. Standard Judkins catheters were used for selective coronary angiography. The aortic valve is not crossed (mean gradient 60 by echo). There were no immediate procedural  complications. The patient was transferred to the post catheterization recovery area for further monitoring.   Estimated blood loss <50 mL.  During this procedure the patient was administered the following to achieve and maintain moderate conscious sedation: Versed 2 mg, Fentanyl 50 mcg, while the patient's heart rate, blood pressure, and oxygen saturation were continuously monitored. The period of conscious sedation was 19 minutes, of which I was present face-to-face 100% of this time.  Coronary Findings   Diagnostic  Dominance: Right  Left Main  Vessel is angiographically normal.  Ramus Intermedius  Vessel is angiographically normal.  Left Circumflex  Vessel is angiographically normal.  Right Coronary Artery  Vessel is angiographically normal. Dominant vessel, no angiographic disease  Intervention   No interventions have been documented.  Right Heart   Right Heart Pressures Hemodynamic findings consistent with mild pulmonary hypertension.  Left Heart   Mitral Valve The annulus is calcified.  Aortic Valve There is severe aortic valve stenosis. The aortic valve is calcified. There is restricted aortic valve motion.  Coronary  Diagrams   Diagnostic Diagram       Implants    No implant documentation for this case.  MERGE Images   Show images for CARDIAC CATHETERIZATION   Link to Procedure Log   Procedure Log    Hemo Data    Most Recent Value  Fick Cardiac Output 7.53 L/min  Fick Cardiac Output Index 3.26 (L/min)/BSA  RA A Wave 17 mmHg  RA V Wave 14 mmHg  RA Mean 13 mmHg  RV Systolic Pressure 49 mmHg  RV Diastolic Pressure 7 mmHg  RV EDP 15 mmHg  PA Systolic Pressure 48 mmHg  PA Diastolic Pressure 22 mmHg  PA Mean 33 mmHg  PW A Wave 29 mmHg  PW V Wave 29 mmHg  PW Mean 26 mmHg  AO Systolic Pressure 160 mmHg  AO Diastolic Pressure 59 mmHg  AO Mean 83 mmHg  QP/QS 1  TPVR Index 10.13 HRUI  TSVR Index 25.46 HRUI  PVR SVR Ratio 0.1  TPVR/TSVR Ratio 0.4      Cardiac TAVR CT  TECHNIQUE: The patient was scanned on a [employer]. A 120 kV retrospective scan was triggered in the descending thoracic aorta at 111 HU's. Gantry rotation speed was 250 msecs and collimation was .6 mm. No beta blockade or nitro were given. The 3D data set was reconstructed in 5% intervals of the R-R cycle. Systolic and diastolic phases were analyzed on a dedicated work station using MPR, MIP and VRT modes. The patient received 80 cc of contrast.  FINDINGS: Aortic Valve: Trileaflet aortic valve with severe calcifications asymmetrically predominantly located in the non-coronary cusp with minimal extension into the LVOT.  Aorta: Normal size, no dissection, minimal calcifications.  Sinotubular Junction: 28 x 27 mm  Ascending Thoracic Aorta: 34 x 32 mm  Aortic Arch: 29 x 26 mm  Descending Thoracic Aorta: 25 x 24 mm  Sinus of Valsalva Measurements:  Non-coronary: 33 mm  Right -coronary: 30 mm  Left -coronary: 31 mm  Coronary Artery Height above Annulus:  Left Main: 14 mm  Right Coronary: 20 mm  Virtual Basal Annulus Measurements:  Maximum/Minimum Diameter: 27.4 x 21.2 mm  Mean Diameter: 23.7 mm  Perimeter: 76.5 mm  Area: 440 mm2  Optimum Fluoroscopic Angle for Delivery:  RAO 5 CRA 4  IMPRESSION: 1. Trileaflet aortic valve with severe calcifications asymmetrically predominantly located in the non-coronary cusp with minimal extension into the LVOT. Annular measurements suitable for delivery of a 26 mm Edwards-SAPIEN 3 valve.  2. Sufficient coronary to annulus distance.  3. Optimum Fluoroscopic Angle for Delivery: RAO 5 CRA 4  4. No thrombus in the left atrial appendage.  5.  Dilated pulmonary artery measuring 34 mm.   Electronically Signed   By: Ena Dawley   On: 06/21/2017 23:12       CT ANGIOGRAPHY CHEST, ABDOMEN AND PELVIS  TECHNIQUE: Multidetector CT imaging through the  chest, abdomen and pelvis was performed using the standard protocol during bolus administration of intravenous contrast. Multiplanar reconstructed images and MIPs were obtained and reviewed to evaluate the vascular anatomy.  CONTRAST:  163mL ISOVUE-370 IOPAMIDOL (ISOVUE-370) INJECTION 76%  COMPARISON:  Chest CT 04/12/2016. CT the abdomen and pelvis 12/28/2007.  FINDINGS: CTA CHEST FINDINGS  Cardiovascular: Heart size is normal. There is no significant pericardial fluid, thickening or pericardial calcification. Aortic atherosclerosis. No definite coronary artery calcifications. Severe thickening calcification of the aortic valve. Severe calcifications of the mitral annulus. Left sided pacemaker device in place with lead tips terminating  in right atrial appendage and right ventricular apex.  Mediastinum/Lymph Nodes: No pathologically enlarged mediastinal or hilar lymph nodes. Esophagus is unremarkable in appearance. No axillary lymphadenopathy.  Lungs/Pleura: No suspicious pulmonary nodules or masses. No acute consolidative airspace disease. No pleural effusions.  Musculoskeletal/Soft Tissues: There are no aggressive appearing lytic or blastic lesions noted in the visualized portions of the skeleton.  CTA ABDOMEN AND PELVIS FINDINGS  Hepatobiliary: No suspicious cystic or solid hepatic lesions. No intra or extrahepatic biliary ductal dilatation. 1.6 cm calcified gallstone lying dependently in the gallbladder. No findings to suggest an acute cholecystitis at this time.  Pancreas: No pancreatic mass. No pancreatic ductal dilatation. No pancreatic or peripancreatic fluid or inflammatory changes.  Spleen: Unremarkable.  Adrenals/Urinary Tract: Multiple large calculi are noted within the collecting systems of both kidneys, largest of which is in the upper pole of the right kidney measuring 1.9 x 1.7 x 2.0 cm. No additional calculi are noted along the course of  either ureter or within the lumen of the urinary bladder. Multiple low-attenuation lesions in the left kidney, compatible with simple cysts, largest of which measures 8.4 cm in the interpolar region. Mild multifocal cortical thinning in the right kidney. No suspicious renal lesions. No hydroureteronephrosis. Urinary bladder is normal in appearance.  Stomach/Bowel: Normal appearance of the stomach. No pathologic dilatation of small bowel or colon. Numerous colonic diverticulae are noted, without surrounding inflammatory changes to suggest an acute diverticulitis at this time. Normal appendix.  Vascular/Lymphatic: Aortic atherosclerosis, without evidence of aneurysm or dissection in the abdominal or pelvic vasculature. Celiac axis, superior mesenteric artery and inferior mesenteric artery are all widely patent without hemodynamically significant stenosis. Single renal arteries bilaterally are both widely patent without hemodynamically significant stenosis. Multiple prominent borderline enlarged and mildly enlarged pelvic and retroperitoneal lymph nodes are noted, measuring up to 1.4 cm in the left common iliac distribution (axial image 148 of series 14 and 1.3 cm in short axis in the aortocaval nodal station (axial image 126 of series 14).  Reproductive: Status post hysterectomy. Ovaries are not confidently identified may be surgically absent or atrophic.  Other: No significant volume of ascites.  No pneumoperitoneum.  Musculoskeletal: There are no aggressive appearing lytic or blastic lesions noted in the visualized portions of the skeleton.  VASCULAR MEASUREMENTS PERTINENT TO TAVR:  AORTA:  Minimal Aortic Diameter-18 x 18 mm  Severity of Aortic Calcification-mild  RIGHT PELVIS:  Right Common Iliac Artery -  Minimal Diameter-10.7 x 10.4 mm  Tortuosity-mild-to-moderate  Calcification-mild  Right External Iliac Artery -  Minimal Diameter-9.8 x 8.1  mm  Tortuosity-moderate  Calcification-none  Right Common Femoral Artery -  Minimal Diameter-8.1 x 8.3 mm  Tortuosity-mild  Calcification-mild  LEFT PELVIS:  Left Common Iliac Artery -  Minimal Diameter-10.8 x 11.3 mm  Tortuosity-moderate to severe  Calcification-mild  Left External Iliac Artery -  Minimal Diameter-8.6 x 8.4 mm  Tortuosity-moderate  Calcification-none  Left Common Femoral Artery -  Minimal Diameter-8.5 x 9.0 mm  Tortuosity-mild  Calcification-none  Review of the MIP images confirms the above findings.  IMPRESSION: 1. Vascular findings and measurements pertinent to potential TAVR procedure, as detailed above. 2. Severe thickening calcification of the aortic valve, compatible with the reported clinical history of severe aortic stenosis. 3. There is also severe thickening calcification of the mitral valve and annulus. 4. Several prominent borderline enlarged and mildly enlarged pelvic and retroperitoneal lymph nodes. These are of uncertain etiology and significance, but clinical correlation for any signs are symptoms  of occult malignancy is suggested. Additionally, attention on follow-up imaging is recommended with repeat contrast enhanced CT the abdomen and pelvis in 3-6 months. 5. Aortic atherosclerosis. 6. Numerous large nonobstructive calculi are noted within the collecting systems of both kidneys, as above. 7. Cholelithiasis without evidence of acute cholecystitis at this time. 8. Mild colonic diverticulosis without evidence of acute diverticulitis at this time. 9. Additional incidental findings, as above.  Aortic Atherosclerosis (ICD10-I70.0).   Electronically Signed   By: Vinnie Langton M.D.   On: 06/21/2017 10:31   STS Risk Calculator  Procedure: Isolated AVR CALCULATE  Risk of Mortality:  3.306%   Renal Failure:  4.755%   Permanent Stroke:  1.631%   Prolonged Ventilation:  14.392%   DSW  Infection:  0.340%   Reoperation:  3.046%   Morbidity or Mortality:  20.367%   Short Length of Stay:  14.688%   Long Length of Stay:  11.318%        Impression:  Patient has stage D severe symptomatic aortic stenosis.  She describes stable symptoms of exertional shortness of breath and fatigue consistent with chronic diastolic congestive heart failure, New York Heart Association functional class III.  Her mobility is very limited because of spinal stenosis, obesity, and severe degenerative arthritis.  I have personally reviewed the patient's recent transthoracic echocardiogram, diagnostic cardiac catheterization, and CT angiograms.  Echocardiogram reveals severe aortic stenosis.  The aortic valve appears trileaflet with severe thickening, calcification, and restricted leaflet mobility involving all 3 leaflets of the aortic valve.  Peak velocity across the aortic valve measured 4.8 m/s corresponding to mean transvalvular gradient estimated greater than 60 mmHg.  Left ventricular systolic function remains normal.  Diagnostic cardiac catheterization as noted above the absence of significant coronary artery disease.  Risks associated with conventional surgical aortic valve replacement would be at least moderately elevated because of the patient's advanced age and comorbid medical problems.  I would be very reluctant to consider this patient a candidate for conventional surgery.  Cardiac-gated CTA of the heart reveals anatomical characteristics consistent with aortic stenosis suitable for treatment by transcatheter aortic valve replacement without any significant complicating features and CTA of the aorta and iliac vessels demonstrate what appears to be adequate pelvic vascular access to facilitate a transfemoral approach.    Plan:  The patient was counseled at length regarding treatment alternatives for management of severe symptomatic aortic stenosis. Alternative approaches such as conventional  aortic valve replacement, transcatheter aortic valve replacement, and continued medical therapy without intervention were compared and contrasted at length.  The risks associated with conventional surgical aortic valve replacement were discussed in detail, as were expectations for post-operative convalescence, and why I would be reluctant to consider this patient a candidate for conventional surgery.  Issues specific to transcatheter aortic valve replacement were discussed including questions about long term valve durability, the potential for paravalvular leak, possible increased risk of need for permanent pacemaker placement, and other technical complications related to the procedure itself.  Long-term prognosis with medical therapy was discussed. This discussion was placed in the context of the patient's own specific clinical presentation and past medical history.  All of their questions have been addressed.  The patient hopes to proceed with transcatheter aortic valve replacement in the near future.  We tentatively plan to proceed on Jul 17, 2017.  Following the decision to proceed with transcatheter aortic valve replacement, a discussion has been held regarding what types of management strategies would be attempted intraoperatively in the event  of life-threatening complications, including whether or not the patient would be considered a candidate for the use of cardiopulmonary bypass and/or conversion to open sternotomy for attempted surgical intervention.  The patient has been advised of a variety of complications that might develop including but not limited to risks of death, stroke, paravalvular leak, aortic dissection or other major vascular complications, aortic annulus rupture, device embolization, cardiac rupture or perforation, mitral regurgitation, acute myocardial infarction, arrhythmia, heart block or bradycardia requiring permanent pacemaker placement, congestive heart failure, respiratory  failure, renal failure, pneumonia, infection, other late complications related to structural valve deterioration or migration, or other complications that might ultimately cause a temporary or permanent loss of functional independence or other long term morbidity.  The patient provides full informed consent for the procedure as described and all questions were answered.   I spent in excess of 90 minutes during the conduct of this office consultation and >50% of this time involved direct face-to-face encounter with the patient for counseling and/or coordination of their care.     Valentina Gu. Roxy Manns, MD 07/02/2017 5:23 PM

## 2017-07-02 NOTE — Therapy (Signed)
[place], [place], [address] Phone: [phone]   Fax:  [phone]  Physical Therapy Evaluation  Patient Details  Name: Brystal Kildow MRN: [id number] Date of Birth: 01-31-1936 Referring Provider: Dr Sherren Mocha    Encounter Date: 07/02/2017  PT End of Session - 07/02/17 1026    Visit Number  1    Number of Visits  1    Date for PT Re-Evaluation  07/02/17    Authorization Type  Medicare    PT Start Time  0930    PT Stop Time  1007    PT Time Calculation (min)  37 min    Activity Tolerance  Patient tolerated treatment well    Behavior During Therapy  [facility] for tasks assessed/performed       Past Medical History:  Diagnosis Date  . Aortic stenosis, moderate 2013  . Arthritis   . Cancer [facility])    CANCER  . Diabetes mellitus   . Hypertension   . Mitral stenosis 10/02/2016  . Morbid obesity (HCC)    BMI > 40  . Ovarian cancer ([place])   . Pulmonary embolism ([place]) 2013   post op  . Spinal stenosis     Past Surgical History:  Procedure Laterality Date  . ABDOMINAL HYSTERECTOMY  2013  . BREAST SURGERY  03-2004   lumpectomy   . PACEMAKER IMPLANT N/A 04/21/2016   Procedure: Pacemaker Implant;  Surgeon: Will Meredith Leeds, MD;  Location: [place] [facility];  Service: Cardiovascular;  Laterality: N/A;    There were no vitals filed for this visit.   Subjective Assessment - 07/02/17 0938    Subjective  Patients chief complaint is bilateral knee pain. Her right knee is more painful then the left. She feels like that is limiting her mobility but over the past year she has felt short of breath which has limited her as well. She uses a rollator for [employer] mobility tasks.     Currently in Pain?  Yes    Pain Score  4     Pain Location  Knee    Pain Orientation  Right;Left    Pain Descriptors / Indicators  Aching    Pain Onset  More than a month ago    Pain Frequency  Constant    Aggravating Factors    standing and walking     Pain Relieving Factors  rest     Multiple Pain Sites  Yes    Pain Score  4    Pain Location  Hip    Pain Orientation  Right;Left    Pain Descriptors / Indicators  Aching    Pain Type  Chronic pain    Pain Onset  More than a month ago    Pain Frequency  Constant    Aggravating Factors   standing and walking     Pain Relieving Factors  rest          [facility] PT Assessment - 07/02/17 0001      Assessment   Medical Diagnosis  Severe Aortic Stenosi     Referring Provider  Dr Sherren Mocha     Onset Date/Surgical Date  -- 1 year of SOB/ Arthritis has been going on for several years    Hand Dominance  Right    Next MD Visit  Today     Prior Therapy  None       Precautions   Precautions  None  Restrictions   Weight Bearing Restrictions  No      Balance Screen   Has the patient fallen in the past 6 months  No    Has the patient had a decrease in activity level because of a fear of falling?   No    Is the patient reluctant to leave their home because of a fear of falling?   No      Home Environment   Additional Comments  4 steps goinginto the house that make her short of breath       Prior Function   Level of Independence  Independent with household mobility with device    Vocation  Retired      Associate Professor   Overall Cognitive Status  Within Functional Limits for tasks assessed    Attention  Focused    Focused Attention  Appears intact    Memory  Appears intact    Awareness  Appears intact    Problem Solving  Appears intact      Sensation   Additional Comments  numbness and tingling in her feet likely because of circulation       Coordination   Gross Motor Movements are Fluid and Coordinated  Yes    Fine Motor Movements are Fluid and Coordinated  Yes      ROM / Strength   AROM / PROM / Strength  AROM;PROM;Strength      AROM   Overall AROM Comments  full active upper extremity range of motion       Strength   Overall Strength Comments   upper extrmeity strength 5/5; all other lower extremity strength 5/5     Strength Assessment Site  Hip;Knee;Hand    Right/Left hand  Right;Left    Right Hand Grip (lbs)  65    Left Hand Grip (lbs)  50    Right/Left Hip  Right;Left    Right Hip Flexion  4/5    Left Hip Flexion  4/5    Right/Left Knee  Right;Left       [facility] Pre-Surgical Assessment - 07/02/17 0001    5 Meter Walk Test- trial 1  8 sec    5 Meter Walk Test- trial 2  6 sec.     5 Meter Walk Test- trial 3  7 sec.    5 meter walk test average  7 sec    4 Stage Balance Test Position  2    comment  unable to come to full tandem     Comment  stood 2x in 16 seconds      ADL/IADL Independent with:  Bathing;Dressing;Meal prep;Finances    ADL/IADL Needs Assistance with:  [employer] work    Other comment  also limited by bilateral arthritis     6 Minute Walk- Baseline  yes    BP (mmHg)  129/63    HR (bpm)  58    02 Sat (%RA)  97 %    Modified Borg Scale for Dyspnea  0- Nothing at all    6 Minute Walk Post Test  yes    BP (mmHg)  140/74    HR (bpm)  85    02 Sat (%RA)  93 %    Modified Borg Scale for Dyspnea  4- somewhat severe    Perceived Rate of Exertion (Borg)  12-    Aerobic Endurance Distance Walked  555    Endurance additional comments  required 2 standing rest breaks  Objective measurements completed on examination: See above findings.              PT Education - 07/02/17 1026    Education provided  Yes    Education Details  POC     Person(s) Educated  Patient    Methods  Explanation;Demonstration;Tactile cues;Verbal cues    Comprehension  Verbalized understanding;Returned demonstration                  Plan - 07/02/17 1027    Clinical Impression Statement  See below     History and Personal Factors relevant to plan of care:  Multi Joint OA     Clinical Presentation  Stable    Clinical Decision Making  Low    Rehab Potential  Good    PT Frequency  One time visit     Consulted and Agree with Plan of Care  Patient      Clinical Impression Statement: Pt is a 82 yo female presenting to OP PT for evaluation prior to possible TAVR surgery due to severe aortic stenosis. Pt reports onset of dyspnea approximately 12 months ago. Symptoms are limiting her ability walk long distance. She is also limited by bilateral knee and hip OA . Pt presents with normal ROM and limited knee and hip strength, decreased balance and is at high fall risk 4 stage balance test, decreased walking speed and decreased aerobic endurance per 6 minute walk test. Pt ambulated 185 feet in 1:37  before requesting a standing rest beak lasting 28 seconds. At time of rest, patient's HR was 85 bpm and O2 was 93 on room air. Pt reported 4/10 shortness of breath on modified scale for dyspnea. Pt able to resume after rest and ambulate an additional 185 feet requiring another standing rest break for 3seconds. Pt ambulated a total of 555 feet in 6 minute walk. B/P increased significantly with 6 minute walk test. Based on the Short Physical Performance Battery, patient has a frailty rating of 5/12 with </= 5/12 considered frail.     Patient will benefit from skilled therapeutic intervention in order to improve the following deficits and impairments:  Abnormal gait, Decreased activity tolerance, Decreased knowledge of precautions, Pain, Decreased strength, Decreased mobility  Visit Diagnosis: Other abnormalities of gait and mobility  Chronic pain of right knee  Chronic pain of left knee      Problem List Patient Active Problem List   Diagnosis Date Noted  . Mitral stenosis 10/02/2016  . Symptomatic bradycardia 04/21/2016  . Bradycardia 04/20/2016  . Morbid obesity ([place]) 04/20/2016  . Essential hypertension 04/20/2016  . Insulin dependent diabetes mellitus ([place]) 04/20/2016  . History of ovarian cancer 04/20/2016  . Aortic stenosis 04/20/2016  . Spinal stenosis 04/20/2016  . Complete heart block  ([place]) 04/20/2016  . History of breast cancer 12/27/2010    Carney Living PT DPT  07/02/2017, 10:31 AM     [facility] [address] [place], [place], [address] Phone: [phone]   Fax:  [phone]  Name: Rheba Diamond MRN: [id number] Date of Birth: October 08, 1935

## 2017-07-02 NOTE — Patient Instructions (Signed)
Stop taking metformin 2 days prior to surgery  Continue taking all other medications without change through the day before surgery.  Have nothing to eat or drink after midnight the night before surgery.  On the morning of surgery take only Prozac with a sip of water.

## 2017-07-04 ENCOUNTER — Institutional Professional Consult (permissible substitution) (INDEPENDENT_AMBULATORY_CARE_PROVIDER_SITE_OTHER): Payer: Medicare Other | Admitting: Surgery

## 2017-07-04 ENCOUNTER — Encounter: Payer: Self-pay | Admitting: Surgery

## 2017-07-04 VITALS — BP 120/56 | HR 72 | Resp 20 | Ht 67.0 in | Wt 274.0 lb

## 2017-07-04 DIAGNOSIS — I350 Nonrheumatic aortic (valve) stenosis: Secondary | ICD-10-CM

## 2017-07-04 NOTE — H&P (View-Only) (Signed)
Patient ID: [name], female   DOB: 06-05-1935, 82 y.o.   MRN: [id number]  [facility] REPORT  Referring Provider is Skeet Latch, MD PCP is Leighton Ruff, MD  Chief Complaint  Patient presents with  . Aortic Stenosis    2nd TAVR eval, review all studies    HPI:  The patient is an 82 year old woman with history of diabetes, hypertension, complete heart block status post permanent pacemaker placement, chronic diastolic congestive heart failure, aortic stenosis, ovarian cancer, breast cancer, degenerative arthritis and spinal stenosis with decreased mobility who is being evaluated for consideration of transcatheter aortic valve replacement to treat her severe aortic stenosis.  Her cardiac history started in February 2018 when she was hospitalized with progressive shortness of breath and dizziness.  She was found to be in complete heart block and underwent permanent pacemaker placement.  An echocardiogram at that time showed moderate aortic stenosis with a mean gradient of 33 mmHg and normal left ventricular function.  She has been followed by Dr. Oval Linsey with serial echocardiograms have showed progression of her aortic stenosis.  Her most recent echocardiogram shows a mean transvalvular gradient of 62 mmHg with a DVI of 0.21.  Left ventricular ejection fraction remains normal at 55 to 60%.  There is mild mitral stenosis and mild to moderate mitral regurgitation.  The mean mitral valve gradient is 4 mmHg.  She underwent cardiac catheterization by Dr. Burt Knack on 06/13/2017 which showed no significant coronary disease.  The patient is married but here by herself today.  She lives in [place] with her husband who has been chronically ill with a long history of throat cancer.  She tries to stay as active as possible but is limited by her spinal stenosis and severe degenerative arthritis involving both  hips and knees.  She uses a rolling walker even while at home.  She has exertional shortness of breath and fatigue as well as bilateral lower extremity edema.  She has had no symptoms at rest.  She has not had any dizziness since her pacemaker was placed.  Past Medical History:  Diagnosis Date  . Aortic stenosis   . Arthritis   . Breast cancer [facility])    CANCER  . Diabetes mellitus   . Hypertension   . Mitral stenosis   . Morbid obesity (HCC)    BMI > 40  . Ovarian cancer ([place])   . Pulmonary embolism ([place]) 2013   post op  . Spinal stenosis     Past Surgical History:  Procedure Laterality Date  . ABDOMINAL HYSTERECTOMY  2013  . BREAST SURGERY  03-2004   lumpectomy   . PACEMAKER IMPLANT N/A 04/21/2016   Procedure: Pacemaker Implant;  Surgeon: Will Meredith Leeds, MD;  Location: [facility];  Service: Cardiovascular;  Laterality: N/A;    Family History  Problem Relation Age of Onset  . Cancer Father        BLADDER  . Cancer Brother   . Cancer Sister     Social History   Socioeconomic History  . Marital status: Married    Spouse name: Not on file  . Number of children: Not on file  . Years of education: Not on file  . Highest education level: Not on file  Occupational History  . Not on file  Social Needs  . Financial resource strain: Not on file  . Food insecurity:    Worry: Not on file  Inability: Not on file  . Transportation needs:    Medical: Not on file    Non-medical: Not on file  Tobacco Use  . Smoking status: Former Smoker    Packs/day: 1.00    Years: 5.00    Pack years: 5.00    Types: Cigarettes  . Smokeless tobacco: Never Used  Substance and Sexual Activity  . Alcohol use: No  . Drug use: No  . Sexual activity: Not on file  Lifestyle  . Physical activity:    Days per week: Not on file    Minutes per session: Not on file  . Stress: Not on file  Relationships  . Social connections:    Talks on phone: Not on file    Gets together: Not  on file    Attends religious service: Not on file    Active member of club or organization: Not on file    Attends meetings of clubs or organizations: Not on file    Relationship status: Not on file  . Intimate partner violence:    Fear of current or ex partner: Not on file    Emotionally abused: Not on file    Physically abused: Not on file    Forced sexual activity: Not on file  Other Topics Concern  . Not on file  Social History Narrative  . Not on file    Current Outpatient Medications  Medication Sig Dispense Refill  . acetaminophen (TYLENOL) 650 MG CR tablet Take 1,300 mg by mouth every 8 (eight) hours as needed for pain.    [name] Kitchen aspirin 81 MG tablet Take 81 mg by mouth daily.      [name] Kitchen atorvastatin (LIPITOR) 20 MG tablet Take 20 mg by mouth every evening.      . Calcium Citrate-Vitamin D (CALCIUM CITRATE + PO) Take 1 tablet by mouth 2 (two) times daily.     . cholecalciferol (VITAMIN D) 1000 units tablet Take 1,000 Units by mouth 2 (two) times daily.     . fish oil-omega-3 fatty acids 1000 MG capsule Take 1 g by mouth every evening.     [name] Kitchen FLUoxetine (PROZAC) 40 MG capsule Take 40 mg by mouth every morning.      . furosemide (LASIX) 40 MG tablet Take 60 mg by mouth daily.     [name] Kitchen LEVEMIR FLEXTOUCH 100 UNIT/ML Pen Inject 60-70 Units into the skin 2 (two) times daily. Per sliding scale    . Magnesium 400 MG CAPS Take 400 mg by mouth every evening.     . metFORMIN (GLUCOPHAGE) 1000 MG tablet Take 1,000 mg by mouth 2 (two) times daily with a meal.     . metoprolol tartrate (LOPRESSOR) 25 MG tablet Take 12.5 mg by mouth 2 (two) times daily.     . Multiple Vitamin (MULTIVITAMIN) tablet Take 1 tablet by mouth daily.      [name] Kitchen olmesartan (BENICAR) 20 MG tablet Take 10 mg by mouth every evening.     No current facility-administered medications for this visit.     Allergies  Allergen Reactions  . Other Rash and Other (See Comments)    CLEAR PLASTIC TAPE      Review of  Systems:   General:  normal appetite, decreased energy, no weight gain, no weight loss, no fever  Cardiac:  no chest pain with exertion, no chest pain at rest, + SOB with  exertion, no resting SOB, no PND, no orthopnea, no palpitations, no arrhythmia, no atrial fibrillation, + LE edema,  no dizzy spells, no syncope  Respiratory:  + exertional shortness of breath, no home oxygen, no productive cough, no dry cough, no bronchitis, no wheezing, no hemoptysis, no asthma, no pain with inspiration or cough, no sleep apnea, no CPAP at night  GI:   no difficulty swallowing, no reflux, no frequent heartburn, no hiatal hernia, no abdominal pain, no constipation, no diarrhea, no hematochezia, no hematemesis, no melena  GU:   no dysuria,  + frequency, no urinary tract infection, no hematuria, no kidney stones, no kidney disease  Vascular:  no pain suggestive of claudication, no pain in feet, no leg cramps, no varicose veins, no DVT, no non-healing foot ulcer  Neuro:   no stroke, no TIA's, no seizures, no headaches, no temporary blindness one eye,  on slurred speech, no peripheral neuropathy, + chronic pain, + instability of gait, no memory/cognitive dysfunction  Musculoskeletal: + arthritis, + joint swelling, + myalgias, + difficulty walking, reduced mobility   Skin:   no rash, no itching, no skin infections, no pressure sores or ulcerations  Psych:   + anxiety, no depression, no nervousness, no unusual recent stress  Eyes:   no blurry vision, no floaters, no recent vision changes, does not wear glasses  ENT:   no hearing loss, no loose or painful teeth, + dentures, last saw dentist 05/2017  Hematologic:  no easy bruising, no abnormal bleeding, no clotting disorder, no frequent epistaxis  Endocrine:  + diabetes, does not check CBG's at home     Physical Exam:   BP (!) 120/56   Pulse 72   Resp 20   Ht 5\' 7"  (1.702 m)   Wt 274 lb (124.3 kg)   BMI 42.91 kg/m   General:  Obese,  well-appearing  HEENT:  Unremarkable, NCAT, PERLA, EOMI, oropharynx clear  Neck:   no JVD, no bruits, no adenopathy or thyromegaly  Chest:   clear to auscultation, symmetrical breath sounds, no wheezes, no rhonchi   CV:   RRR, grade III/VI crescendo/decrescendo murmur heard best at RSB,  no diastolic murmur  Abdomen:  soft, non-tender, no masses or organomegaly  Extremities:  warm, well-perfused, pulses palpable, mild bilateral LE edema  Rectal/GU  Deferred  Neuro:   Grossly non-focal and symmetrical throughout  Skin:   Clean and dry, no rashes, no breakdown   Diagnostic Tests:     [facility]                        [address],  [address]                            [phone]  ------------------------------------------------------------------- Transthoracic Echocardiography  (Report amended )  Patient:    Antwanette, Wesche MR #:       [id number] Study Date: 05/14/2017 Gender:     F Age:        29 Height:     171.5 cm Weight:     124.3 kg BSA:        2.49 m^2 Pt. Status: Room:   [facility] [id number]  SONOGRAPHER  Marygrace Drought, RCS  PERFORMING   [facility], Outpatient  ATTENDING    Skeet Latch, MD  [facility]     Skeet Latch, MD  REFERRING    Skeet Latch, MD  cc:  ------------------------------------------------------------------- LV EF: 55% -   60%  ------------------------------------------------------------------- Indications:      Aortic Valve Disorder (I35.0).  ------------------------------------------------------------------- History:   PMH:   Mitral valve disease.  Risk factors: Hypertension. Diabetes mellitus.  ------------------------------------------------------------------- Study Conclusions  - Left ventricle: The cavity size was normal. There was moderate   concentric hypertrophy. Systolic function was normal. The   estimated ejection fraction was in the range of  55% to 60%. Wall   motion was normal; there were no regional wall motion   abnormalities. Doppler parameters are consistent with abnormal   left ventricular relaxation (grade 1 diastolic dysfunction).   Doppler parameters are consistent with high ventricular filling   pressure. - Aortic valve: Probably trileaflet; severely thickened, severely   calcified leaflets. Valve mobility was restricted. There was   critical stenosis. There was no regurgitation. Peak velocity (S):   478 cm/s. Mean gradient (S): 62 mm Hg. Valve area (VTI): 0.57   cm^2. Valve area (Vmax): 0.65 cm^2. Valve area (Vmean): 0.51   cm^2. - Mitral valve: Severely calcified annulus. Mobility was   restricted. The findings are consistent with mild stenosis. There   was mild regurgitation. Valve area by pressure half-time: 1.93   cm^2. Valve area by continuity equation (using LVOT flow): 1.43   cm^2. - Left atrium: The atrium was severely dilated. - Right ventricle: The cavity size was normal. Wall thickness was   normal. Systolic function was normal. - Tricuspid valve: There was mild regurgitation. - Pulmonary arteries: Systolic pressure was mildly increased. PA   peak pressure: 44 mm Hg (S).  ------------------------------------------------------------------- Study data:  Comparison was made to the study of 04/21/2016.  Study status:  Routine.  Procedure:  The patient reported no pain pre or post test. Transthoracic echocardiography. Image quality was adequate.          Transthoracic echocardiography.  M-mode, complete 2D, spectral Doppler, and color Doppler.  Birthdate: Patient birthdate: 1935-05-01.  Age:  Patient is 82 yr old.  Sex: Gender: female.    BMI: 42.3 kg/m^2.  Blood pressure:     130/78 Patient status:  Outpatient.  Study date:  Study date: 05/14/2017. Study time: 11:29 AM.  Location:  [facility]  -------------------------------------------------------------------  ------------------------------------------------------------------- Left ventricle:  The cavity size was normal. There was moderate concentric hypertrophy. Systolic function was normal. The estimated ejection fraction was in the range of 55% to 60%. Wall motion was normal; there were no regional wall motion abnormalities. Doppler parameters are consistent with abnormal left ventricular relaxation (grade 1 diastolic dysfunction). Doppler parameters are consistent with high ventricular filling pressure.  ------------------------------------------------------------------- Aortic valve:  Poorly visualized.  Probably trileaflet; severely thickened, severely calcified leaflets. Valve mobility was restricted.  Doppler:   There was critical stenosis.   There was no regurgitation.    VTI ratio of LVOT to aortic valve: 0.18. Valve area (VTI): 0.57 cm^2. Indexed valve area (VTI): 0.23 cm^2/m^2. Peak velocity ratio of LVOT to aortic valve: 0.21. Valve area (Vmax): 0.65 cm^2. Indexed valve area (Vmax): 0.26 cm^2/m^2. Mean velocity ratio of LVOT to aortic valve: 0.16. Valve area (Vmean): 0.51 cm^2. Indexed valve area (Vmean): 0.21 cm^2/m^2.    Mean gradient (S): 62 mm Hg. Peak gradient (S): 92 mm Hg.  ------------------------------------------------------------------- Aorta:  Aortic root: The aortic root was normal in size.  ------------------------------------------------------------------- Mitral valve:   Severely calcified annulus. Mobility was restricted.  Doppler:   The findings are  consistent with mild stenosis.   There was mild regurgitation.    Valve area by pressure half-time: 1.93 cm^2. Indexed valve area by pressure half-time: 0.77 cm^2/m^2. Valve area by continuity equation (using LVOT flow): 1.43 cm^2. Indexed valve area by continuity equation (using LVOT flow): 0.57 cm^2/m^2.    Mean gradient (D): 4 mm Hg.  Peak gradient (D): 7 mm Hg.  ------------------------------------------------------------------- Left atrium:  The atrium was severely dilated.  ------------------------------------------------------------------- Right ventricle:  The cavity size was normal. Wall thickness was normal. Systolic function was normal.  ------------------------------------------------------------------- Pulmonic valve:    Doppler:  Transvalvular velocity was within the normal range. There was no evidence for stenosis.  ------------------------------------------------------------------- Tricuspid valve:   Structurally normal valve.    Doppler: Transvalvular velocity was within the normal range. There was mild regurgitation.  ------------------------------------------------------------------- Pulmonary artery:   The main pulmonary artery was normal-sized. Systolic pressure was mildly increased.  ------------------------------------------------------------------- Right atrium:  The atrium was normal in size.  ------------------------------------------------------------------- Pericardium:  There was no pericardial effusion.  ------------------------------------------------------------------- Systemic veins: Inferior vena cava: The vessel was normal in size. The respirophasic diameter changes were in the normal range (= 50%), consistent with normal central venous pressure. Diameter: 21 mm.  ------------------------------------------------------------------- Measurements   IVC                                      Value          Reference  ID                                       21    mm       ----------    Left ventricle                           Value          Reference  LV ID, ED, PLAX chordal                  49.4  mm       43 - 52  LV ID, ES, PLAX chordal                  36.4  mm       23 - 38  LV fx shortening, PLAX chordal   (L)     26    %        >=29  LV PW thickness, ED                       12.46 mm       ----------  IVS/LV PW ratio, ED                      1.12           <=1.3  Stroke volume, 2D                        80    ml       ----------  Stroke volume/bsa, 2D                    32    ml/m^2   ----------  LV e&',  lateral                           5     cm/s     ----------  LV E/e&', lateral                         27.2           ----------  LV e&', medial                            5.22  cm/s     ----------  LV E/e&', medial                          26.05          ----------  LV e&', average                           5.11  cm/s     ----------  LV E/e&', average                         26.61          ----------    Ventricular septum                       Value          Reference  IVS thickness, ED                        13.97 mm       ----------    LVOT                                     Value          Reference  LVOT ID, S                               20    mm       ----------  LVOT area                                3.14  cm^2     ----------  LVOT peak velocity, S                    99    cm/s     ----------  LVOT mean velocity, S                    61.5  cm/s     ----------  LVOT VTI, S                              25.6  cm       ----------    Aortic valve                             Value          Reference  Aortic valve peak velocity, S            478   cm/s     ----------  Aortic valve mean velocity, S            376   cm/s     ----------  Aortic valve VTI, S                      140   cm       ----------  Aortic mean gradient, S                  62    mm Hg    ----------  Aortic peak gradient, S                  92    mm Hg    ----------  VTI ratio, LVOT/AV                       0.18           ----------  Aortic valve area, VTI                   0.57  cm^2     ----------  Aortic valve area/bsa, VTI               0.23  cm^2/m^2 ----------  Velocity ratio, peak, LVOT/AV            0.21           ----------  Aortic valve area, peak velocity          0.65  cm^2     ----------  Aortic valve area/bsa, peak              0.26  cm^2/m^2 ----------  velocity  Velocity ratio, mean, LVOT/AV            0.16           ----------  Aortic valve area, mean velocity         0.51  cm^2     ----------  Aortic valve area/bsa, mean              0.21  cm^2/m^2 ----------  velocity  Aortic regurg pressure half-time         751   ms       ----------    Aorta                                    Value          Reference  Aortic root ID, ED                       31    mm       ----------    Left atrium                              Value          Reference  LA ID, A-P, ES                           43    mm       ----------  LA ID/bsa, A-P  1.72  cm/m^2   <=2.2  LA volume, S                             119   ml       ----------  LA volume/bsa, S                         47.7  ml/m^2   ----------  LA volume, ES, 1-p A4C                   105   ml       ----------  LA volume/bsa, ES, 1-p A4C               42.1  ml/m^2   ----------  LA volume, ES, 1-p A2C                   136   ml       ----------  LA volume/bsa, ES, 1-p A2C               54.5  ml/m^2   ----------    Mitral valve                             Value          Reference  Mitral E-wave peak velocity              136   cm/s     ----------  Mitral A-wave peak velocity              153   cm/s     ----------  Mitral mean velocity, D                  89.2  cm/s     ----------  Mitral deceleration time         (H)     398   ms       150 - 230  Mitral pressure half-time                115   ms       ----------  Mitral mean gradient, D                  4     mm Hg    ----------  Mitral peak gradient, D                  7     mm Hg    ----------  Mitral E/A ratio, peak                   0.9            ----------  Mitral valve area, PHT, DP               1.93  cm^2     ----------  Mitral valve area/bsa, PHT, DP           0.77  cm^2/m^2 ----------  Mitral valve area, LVOT                   1.43  cm^2     ----------  continuity  Mitral valve area/bsa, LVOT              0.57  cm^2/m^2 ----------  continuity  Mitral annulus VTI, D                    56.1  cm       ----------  Mitral regurg VTI, PISA                  209   cm       ----------  Mitral ERO, PISA                         0.1   cm^2     ----------  Mitral regurg volume, PISA               21    ml       ----------    Pulmonary arteries                       Value          Reference  PA pressure, S, DP               (H)     44    mm Hg    <=30    Tricuspid valve                          Value          Reference  Tricuspid regurg peak velocity           300   cm/s     ----------  Tricuspid peak RV-RA gradient            36    mm Hg    ----------  Tricuspid maximal regurg                 300   cm/s     ----------  velocity, PISA    Right atrium                             Value          Reference  RA ID, S-I, ES, A4C              (H)     57.3  mm       34 - 49  RA area, ES, A4C                 (H)     20.6  cm^2     8.3 - 19.5  RA volume, ES, A/L                       59.9  ml       ----------  RA volume/bsa, ES, A/L                   24    ml/m^2   ----------    Systemic veins                           Value          Reference  Estimated CVP                            8     mm Hg    ----------    Right  ventricle                          Value          Reference  TAPSE                                    28.5  mm       ----------  RV pressure, S, DP               (H)     44    mm Hg    <=30  RV s&', lateral, S                        17.3  cm/s     ----------  Legend: (L)  and  (H)  mark values outside specified reference range.  ------------------------------------------------------------------- Neena Rhymes, MD 2019-03-20T11:50:38   Physicians   Panel Physicians Referring Physician Case Authorizing Physician  Sherren Mocha, MD (Primary)    Procedures   RIGHT HEART CATH AND  CORONARY ANGIOGRAPHY  Conclusion   1. Widely patent coronary arteries without obstructive CAD 2. Severely calcified aortic valve with restricted motion, known severe AS by echo 3. Severely calcified mitral valve annulus 4. Mild pulmonary HTN, elevated RA pressure, preserved cardiac output  Plan: continue multidisciplinary evaluation for treatment of severe aortic stenosis, next steps CTA studies and cardiac surgical evaluation  Indications   Severe aortic stenosis [I35.0 (ICD-10-CM)]  Procedural Details/Technique   Technical Details INDICATION: Severe symptomatic aortic stenosis  PROCEDURAL DETAILS: Ultrasound guidance was used to access the right femoral vein and a 7 Fr sheath is placed. [place] images are captured and stored in the patient's paper chart. The right wrist was then prepped, draped, and anesthetized with 1% lidocaine. Using the modified Seldinger technique a 5/6 French Slender sheath was placed in the right radial artery. Intra-arterial verapamil was administered through the radial artery sheath. IV heparin was administered after a JR4 catheter was advanced into the central aorta. A Swan-Ganz catheter was used for the right heart catheterization. Standard protocol was followed for recording of right heart pressures and sampling of oxygen saturations. Fick cardiac output was calculated. Standard Judkins catheters were used for selective coronary angiography. The aortic valve is not crossed (mean gradient 60 by echo). There were no immediate procedural complications. The patient was transferred to the post catheterization recovery area for further monitoring.   Estimated blood loss <50 mL.  During this procedure the patient was administered the following to achieve and maintain moderate conscious sedation: Versed 2 mg, Fentanyl 50 mcg, while the patient's heart rate, blood pressure, and oxygen saturation were continuously monitored. The period of conscious sedation was 19 minutes, of  which I was present face-to-face 100% of this time.  Coronary Findings   Diagnostic  Dominance: Right  Left Main  Vessel is angiographically normal.  Ramus Intermedius  Vessel is angiographically normal.  Left Circumflex  Vessel is angiographically normal.  Right Coronary Artery  Vessel is angiographically normal. Dominant vessel, no angiographic disease  Intervention   No interventions have been documented.  Right Heart   Right Heart Pressures Hemodynamic findings consistent with mild pulmonary hypertension.  Left Heart   Mitral Valve The annulus is calcified.  Aortic Valve There is severe aortic valve stenosis. The aortic valve is calcified. There is restricted  aortic valve motion.  Coronary Diagrams   Diagnostic Diagram       Implants    No implant documentation for this case.  MERGE Images   Show images for CARDIAC CATHETERIZATION   Link to Procedure Log   Procedure Log    Hemo Data    Most Recent Value  Fick Cardiac Output 7.53 L/min  Fick Cardiac Output Index 3.26 (L/min)/BSA  RA A Wave 17 mmHg  RA V Wave 14 mmHg  RA Mean 13 mmHg  RV Systolic Pressure 49 mmHg  RV Diastolic Pressure 7 mmHg  RV EDP 15 mmHg  PA Systolic Pressure 48 mmHg  PA Diastolic Pressure 22 mmHg  PA Mean 33 mmHg  PW A Wave 29 mmHg  PW V Wave 29 mmHg  PW Mean 26 mmHg  AO Systolic Pressure 034 mmHg  AO Diastolic Pressure 59 mmHg  AO Mean 83 mmHg  QP/QS 1  TPVR Index 10.13 HRUI  TSVR Index 25.46 HRUI  PVR SVR Ratio 0.1  TPVR/TSVR Ratio 0.4    ADDENDUM REPORT: 06/21/2017 23:12  CLINICAL DATA:  87 -year-old female with severe aortic stenosis being evaluated for a TAVR procedure.  EXAM: Cardiac TAVR CT  TECHNIQUE: The patient was scanned on a [employer]. A 120 kV retrospective scan was triggered in the descending thoracic aorta at 111 HU's. Gantry rotation speed was 250 msecs and collimation was .6 mm. No beta blockade or nitro were given. The 3D data set  was reconstructed in 5% intervals of the R-R cycle. Systolic and diastolic phases were analyzed on a dedicated work station using MPR, MIP and VRT modes. The patient received 80 cc of contrast.  FINDINGS: Aortic Valve: Trileaflet aortic valve with severe calcifications asymmetrically predominantly located in the non-coronary cusp with minimal extension into the LVOT.  Aorta: Normal size, no dissection, minimal calcifications.  Sinotubular Junction: 28 x 27 mm  Ascending Thoracic Aorta: 34 x 32 mm  Aortic Arch: 29 x 26 mm  Descending Thoracic Aorta: 25 x 24 mm  Sinus of Valsalva Measurements:  Non-coronary: 33 mm  Right -coronary: 30 mm  Left -coronary: 31 mm  Coronary Artery Height above Annulus:  Left Main: 14 mm  Right Coronary: 20 mm  Virtual Basal Annulus Measurements:  Maximum/Minimum Diameter: 27.4 x 21.2 mm  Mean Diameter: 23.7 mm  Perimeter: 76.5 mm  Area: 440 mm2  Optimum Fluoroscopic Angle for Delivery:  RAO 5 CRA 4  IMPRESSION: 1. Trileaflet aortic valve with severe calcifications asymmetrically predominantly located in the non-coronary cusp with minimal extension into the LVOT. Annular measurements suitable for delivery of a 26 mm Edwards-SAPIEN 3 valve.  2. Sufficient coronary to annulus distance.  3. Optimum Fluoroscopic Angle for Delivery: RAO 5 CRA 4  4. No thrombus in the left atrial appendage.  5.  Dilated pulmonary artery measuring 34 mm.   Electronically Signed   By: Ena Dawley   On: 06/21/2017 23:12   Addended by Dorothy Spark, MD on 06/21/2017 11:15 PM    Study Result   EXAM: OVER-READ INTERPRETATION  CT CHEST  The following report is an over-read performed by radiologist Dr. Vinnie Langton of [facility], [place] on 06/21/2017. This over-read does not include interpretation of cardiac or coronary anatomy or pathology. The coronary calcium score/coronary CTA interpretation by  the cardiologist is attached.  COMPARISON:  Chest CT 04/12/2016.  FINDINGS: Extracardiac findings will be described separately under dictation for contemporaneously obtained CTA chest, abdomen and pelvis.  IMPRESSION:  Please see separate dictation for contemporaneously obtained CTA chest, abdomen and pelvis for full description of relevant extracardiac findings.  Electronically Signed: By: Vinnie Langton M.D. On: 06/21/2017 08:33       CLINICAL DATA:  82 year old female with history of severe aortic stenosis. Preprocedural study prior to potential transcatheter aortic valve replacement (TAVR) procedure.  EXAM: CT ANGIOGRAPHY CHEST, ABDOMEN AND PELVIS  TECHNIQUE: Multidetector CT imaging through the chest, abdomen and pelvis was performed using the standard protocol during bolus administration of intravenous contrast. Multiplanar reconstructed images and MIPs were obtained and reviewed to evaluate the vascular anatomy.  CONTRAST:  16mL ISOVUE-370 IOPAMIDOL (ISOVUE-370) INJECTION 76%  COMPARISON:  Chest CT 04/12/2016. CT the abdomen and pelvis 12/28/2007.  FINDINGS: CTA CHEST FINDINGS  Cardiovascular: Heart size is normal. There is no significant pericardial fluid, thickening or pericardial calcification. Aortic atherosclerosis. No definite coronary artery calcifications. Severe thickening calcification of the aortic valve. Severe calcifications of the mitral annulus. Left sided pacemaker device in place with lead tips terminating in right atrial appendage and right ventricular apex.  Mediastinum/Lymph Nodes: No pathologically enlarged mediastinal or hilar lymph nodes. Esophagus is unremarkable in appearance. No axillary lymphadenopathy.  Lungs/Pleura: No suspicious pulmonary nodules or masses. No acute consolidative airspace disease. No pleural effusions.  Musculoskeletal/Soft Tissues: There are no aggressive appearing lytic or blastic lesions  noted in the visualized portions of the skeleton.  CTA ABDOMEN AND PELVIS FINDINGS  Hepatobiliary: No suspicious cystic or solid hepatic lesions. No intra or extrahepatic biliary ductal dilatation. 1.6 cm calcified gallstone lying dependently in the gallbladder. No findings to suggest an acute cholecystitis at this time.  Pancreas: No pancreatic mass. No pancreatic ductal dilatation. No pancreatic or peripancreatic fluid or inflammatory changes.  Spleen: Unremarkable.  Adrenals/Urinary Tract: Multiple large calculi are noted within the collecting systems of both kidneys, largest of which is in the upper pole of the right kidney measuring 1.9 x 1.7 x 2.0 cm. No additional calculi are noted along the course of either ureter or within the lumen of the urinary bladder. Multiple low-attenuation lesions in the left kidney, compatible with simple cysts, largest of which measures 8.4 cm in the interpolar region. Mild multifocal cortical thinning in the right kidney. No suspicious renal lesions. No hydroureteronephrosis. Urinary bladder is normal in appearance.  Stomach/Bowel: Normal appearance of the stomach. No pathologic dilatation of small bowel or colon. Numerous colonic diverticulae are noted, without surrounding inflammatory changes to suggest an acute diverticulitis at this time. Normal appendix.  Vascular/Lymphatic: Aortic atherosclerosis, without evidence of aneurysm or dissection in the abdominal or pelvic vasculature. Celiac axis, superior mesenteric artery and inferior mesenteric artery are all widely patent without hemodynamically significant stenosis. Single renal arteries bilaterally are both widely patent without hemodynamically significant stenosis. Multiple prominent borderline enlarged and mildly enlarged pelvic and retroperitoneal lymph nodes are noted, measuring up to 1.4 cm in the left common iliac distribution (axial image 148 of series 14 and 1.3 cm in  short axis in the aortocaval nodal station (axial image 126 of series 14).  Reproductive: Status post hysterectomy. Ovaries are not confidently identified may be surgically absent or atrophic.  Other: No significant volume of ascites.  No pneumoperitoneum.  Musculoskeletal: There are no aggressive appearing lytic or blastic lesions noted in the visualized portions of the skeleton.  VASCULAR MEASUREMENTS PERTINENT TO TAVR:  AORTA:  Minimal Aortic Diameter-18 x 18 mm  Severity of Aortic Calcification-mild  RIGHT PELVIS:  Right Common Iliac Artery -  Minimal Diameter-10.7 x  10.4 mm  Tortuosity-mild-to-moderate  Calcification-mild  Right External Iliac Artery -  Minimal Diameter-9.8 x 8.1 mm  Tortuosity-moderate  Calcification-none  Right Common Femoral Artery -  Minimal Diameter-8.1 x 8.3 mm  Tortuosity-mild  Calcification-mild  LEFT PELVIS:  Left Common Iliac Artery -  Minimal Diameter-10.8 x 11.3 mm  Tortuosity-moderate to severe  Calcification-mild  Left External Iliac Artery -  Minimal Diameter-8.6 x 8.4 mm  Tortuosity-moderate  Calcification-none  Left Common Femoral Artery -  Minimal Diameter-8.5 x 9.0 mm  Tortuosity-mild  Calcification-none  Review of the MIP images confirms the above findings.  IMPRESSION: 1. Vascular findings and measurements pertinent to potential TAVR procedure, as detailed above. 2. Severe thickening calcification of the aortic valve, compatible with the reported clinical history of severe aortic stenosis. 3. There is also severe thickening calcification of the mitral valve and annulus. 4. Several prominent borderline enlarged and mildly enlarged pelvic and retroperitoneal lymph nodes. These are of uncertain etiology and significance, but clinical correlation for any signs are symptoms of occult malignancy is suggested. Additionally, attention on follow-up imaging is  recommended with repeat contrast enhanced CT the abdomen and pelvis in 3-6 months. 5. Aortic atherosclerosis. 6. Numerous large nonobstructive calculi are noted within the collecting systems of both kidneys, as above. 7. Cholelithiasis without evidence of acute cholecystitis at this time. 8. Mild colonic diverticulosis without evidence of acute diverticulitis at this time. 9. Additional incidental findings, as above.  Aortic Atherosclerosis (ICD10-I70.0).   Electronically Signed   By: Vinnie Langton M.D.   On: 06/21/2017 10:31   STS Risk Calculator  Procedure: Isolated AVR CALCULATE  Risk of Mortality:  3.306%   Renal Failure:  4.755%   Permanent Stroke:  1.631%   Prolonged Ventilation:  14.392%   DSW Infection:  0.340%   Reoperation:  3.046%   Morbidity or Mortality:  20.367%   Short Length of Stay:  14.688%   Long Length of Stay:  11.318%      Impression:  This 82 year old woman has stage D, severe, symptomatic aortic stenosis with New York Heart Association class III symptoms of exertional shortness of breath and fatigue consistent with chronic diastolic congestive heart failure.  I have personally reviewed her recent transthoracic echocardiogram, cardiac catheterization, and CTA studies.  Her echo shows a trileaflet aortic valve with severe thickening and calcification with restricted leaflet mobility.  The mean transvalvular gradient is 62 mm Hg consistent with severe aortic stenosis. Left ventricular systolic function is normal. Cardiac cath shows no significant coronary artery disease. The risk of open surgical AVR would be at least moderately elevated due to her age and comorbid medical problems in addition to her limited mobility due to severe degenerative arthritis and spinal stenosis. She is also the main caregiver for her husband who is in poor health. I think TAVR would be the best treatment for her.  Her gated cardiac CTA shows anatomy suitable for  transcatheter aortic valve replacement without any significant complicating features.  The CTA of her abdomen and pelvis shows adequate pelvic vascular access to allow transfemoral insertion.  The patient was counseled at length regarding treatment alternatives for management of severe symptomatic aortic stenosis. The risks and benefits of surgical intervention has been discussed in detail. Long-term prognosis with medical therapy was discussed. Alternative approaches such as conventional surgical aortic valve replacement, transcatheter aortic valve replacement, and palliative medical therapy were compared and contrasted at length. This discussion was placed in the context of the patient's own specific clinical presentation  and past medical history. All of her questions have been addressed.   Following the decision to proceed with transcatheter aortic valve replacement, a discussion was held regarding what types of management strategies would be attempted intraoperatively in the event of life-threatening complications, including whether or not the patient would be considered a candidate for the use of cardiopulmonary bypass and/or conversion to open sternotomy for attempted surgical intervention.   The patient has been advised of a variety of complications that might develop including but not limited to risks of death, stroke, paravalvular leak, aortic dissection or other major vascular complications, aortic annulus rupture, device embolization, cardiac rupture or perforation, mitral regurgitation, acute myocardial infarction, arrhythmia, heart block or bradycardia requiring permanent pacemaker placement, congestive heart failure, respiratory failure, renal failure, pneumonia, infection, other late complications related to structural valve deterioration or migration, or other complications that might ultimately cause a temporary or permanent loss of functional independence or other long term morbidity. The  patient provides full informed consent for the procedure as described and all questions were answered.       Plan:  Transfemoral TAVR on 07/17/2017.   I spent 45 minutes performing this consultation and > 50% of this time was spent face to face counseling and coordinating the care of this patient's severe, symptomatic aortic stenosis.   Gaye Pollack, MD 07/04/2017

## 2017-07-04 NOTE — Progress Notes (Signed)
Patient ID: Arleigh Dicola, female   DOB: 1936-02-03, 82 y.o.   MRN: [id number]  [facility] [place] [facility]SULTATION REPORT  Referring Provider is Skeet Latch, MD PCP is Leighton Ruff, MD  Chief Complaint  Patient presents with  . Aortic Stenosis    2nd TAVR eval, review all studies    HPI:  The patient is an 82 year old woman with history of diabetes, hypertension, complete heart block status post permanent pacemaker placement, chronic diastolic congestive heart failure, aortic stenosis, ovarian cancer, breast cancer, degenerative arthritis and spinal stenosis with decreased mobility who is being evaluated for consideration of transcatheter aortic valve replacement to treat her severe aortic stenosis.  Her cardiac history started in February 2018 when she was hospitalized with progressive shortness of breath and dizziness.  She was found to be in complete heart block and underwent permanent pacemaker placement.  An echocardiogram at that time showed moderate aortic stenosis with a mean gradient of 33 mmHg and normal left ventricular function.  She has been followed by Dr. Oval Linsey with serial echocardiograms have showed progression of her aortic stenosis.  Her most recent echocardiogram shows a mean transvalvular gradient of 62 mmHg with a DVI of 0.21.  Left ventricular ejection fraction remains normal at 55 to 60%.  There is mild mitral stenosis and mild to moderate mitral regurgitation.  The mean mitral valve gradient is 4 mmHg.  She underwent cardiac catheterization by Dr. Burt Knack on 06/13/2017 which showed no significant coronary disease.  The patient is married but here by herself today.  She lives in [place] with her husband who has been chronically ill with a long history of throat cancer.  She tries to stay as active as possible but is limited by her spinal stenosis and severe degenerative arthritis involving both  hips and knees.  She uses a rolling walker even while at home.  She has exertional shortness of breath and fatigue as well as bilateral lower extremity edema.  She has had no symptoms at rest.  She has not had any dizziness since her pacemaker was placed.  Past Medical History:  Diagnosis Date  . Aortic stenosis   . Arthritis   . Breast cancer [facility])    CANCER  . Diabetes mellitus   . Hypertension   . Mitral stenosis   . Morbid obesity (HCC)    BMI > 40  . Ovarian cancer ([place])   . Pulmonary embolism ([place]) 2013   post op  . Spinal stenosis     Past Surgical History:  Procedure Laterality Date  . ABDOMINAL HYSTERECTOMY  2013  . BREAST SURGERY  03-2004   lumpectomy   . PACEMAKER IMPLANT N/A 04/21/2016   Procedure: Pacemaker Implant;  Surgeon: Will Meredith Leeds, MD;  Location: [facility];  Service: Cardiovascular;  Laterality: N/A;    Family History  Problem Relation Age of Onset  . Cancer Father        BLADDER  . Cancer Brother   . Cancer Sister     Social History   Socioeconomic History  . Marital status: Married    Spouse name: Not on file  . Number of children: Not on file  . Years of education: Not on file  . Highest education level: Not on file  Occupational History  . Not on file  Social Needs  . Financial resource strain: Not on file  . Food insecurity:    Worry: Not on file  Inability: Not on file  . Transportation needs:    Medical: Not on file    Non-medical: Not on file  Tobacco Use  . Smoking status: Former Smoker    Packs/day: 1.00    Years: 5.00    Pack years: 5.00    Types: Cigarettes  . Smokeless tobacco: Never Used  Substance and Sexual Activity  . Alcohol use: No  . Drug use: No  . Sexual activity: Not on file  Lifestyle  . Physical activity:    Days per week: Not on file    Minutes per session: Not on file  . Stress: Not on file  Relationships  . Social connections:    Talks on phone: Not on file    Gets together: Not  on file    Attends religious service: Not on file    Active member of club or organization: Not on file    Attends meetings of clubs or organizations: Not on file    Relationship status: Not on file  . Intimate partner violence:    Fear of current or ex partner: Not on file    Emotionally abused: Not on file    Physically abused: Not on file    Forced sexual activity: Not on file  Other Topics Concern  . Not on file  Social History Narrative  . Not on file    Current Outpatient Medications  Medication Sig Dispense Refill  . acetaminophen (TYLENOL) 650 MG CR tablet Take 1,300 mg by mouth every 8 (eight) hours as needed for pain.    [name] Kitchen aspirin 81 MG tablet Take 81 mg by mouth daily.      [name] Kitchen atorvastatin (LIPITOR) 20 MG tablet Take 20 mg by mouth every evening.      . Calcium Citrate-Vitamin D (CALCIUM CITRATE + PO) Take 1 tablet by mouth 2 (two) times daily.     . cholecalciferol (VITAMIN D) 1000 units tablet Take 1,000 Units by mouth 2 (two) times daily.     . fish oil-omega-3 fatty acids 1000 MG capsule Take 1 g by mouth every evening.     [name] Kitchen FLUoxetine (PROZAC) 40 MG capsule Take 40 mg by mouth every morning.      . furosemide (LASIX) 40 MG tablet Take 60 mg by mouth daily.     [name] Kitchen LEVEMIR FLEXTOUCH 100 UNIT/ML Pen Inject 60-70 Units into the skin 2 (two) times daily. Per sliding scale    . Magnesium 400 MG CAPS Take 400 mg by mouth every evening.     . metFORMIN (GLUCOPHAGE) 1000 MG tablet Take 1,000 mg by mouth 2 (two) times daily with a meal.     . metoprolol tartrate (LOPRESSOR) 25 MG tablet Take 12.5 mg by mouth 2 (two) times daily.     . Multiple Vitamin (MULTIVITAMIN) tablet Take 1 tablet by mouth daily.      [name] Kitchen olmesartan (BENICAR) 20 MG tablet Take 10 mg by mouth every evening.     No current facility-administered medications for this visit.     Allergies  Allergen Reactions  . Other Rash and Other (See Comments)    CLEAR PLASTIC TAPE      Review of  Systems:   General:  normal appetite, decreased energy, no weight gain, no weight loss, no fever  Cardiac:  no chest pain with exertion, no chest pain at rest, + SOB with  exertion, no resting SOB, no PND, no orthopnea, no palpitations, no arrhythmia, no atrial fibrillation, + LE edema,  no dizzy spells, no syncope  Respiratory:  + exertional shortness of breath, no home oxygen, no productive cough, no dry cough, no bronchitis, no wheezing, no hemoptysis, no asthma, no pain with inspiration or cough, no sleep apnea, no CPAP at night  GI:   no difficulty swallowing, no reflux, no frequent heartburn, no hiatal hernia, no abdominal pain, no constipation, no diarrhea, no hematochezia, no hematemesis, no melena  GU:   no dysuria,  + frequency, no urinary tract infection, no hematuria, no kidney stones, no kidney disease  Vascular:  no pain suggestive of claudication, no pain in feet, no leg cramps, no varicose veins, no DVT, no non-healing foot ulcer  Neuro:   no stroke, no TIA's, no seizures, no headaches, no temporary blindness one eye,  on slurred speech, no peripheral neuropathy, + chronic pain, + instability of gait, no memory/cognitive dysfunction  Musculoskeletal: + arthritis, + joint swelling, + myalgias, + difficulty walking, reduced mobility   Skin:   no rash, no itching, no skin infections, no pressure sores or ulcerations  Psych:   + anxiety, no depression, no nervousness, no unusual recent stress  Eyes:   no blurry vision, no floaters, no recent vision changes, does not wear glasses  ENT:   no hearing loss, no loose or painful teeth, + dentures, last saw dentist 05/2017  Hematologic:  no easy bruising, no abnormal bleeding, no clotting disorder, no frequent epistaxis  Endocrine:  + diabetes, does not check CBG's at home     Physical Exam:   BP (!) 120/56   Pulse 72   Resp 20   Ht 5\' 7"  (1.702 m)   Wt 274 lb (124.3 kg)   BMI 42.91 kg/m   General:  Obese,  well-appearing  HEENT:  Unremarkable, NCAT, PERLA, EOMI, oropharynx clear  Neck:   no JVD, no bruits, no adenopathy or thyromegaly  Chest:   clear to auscultation, symmetrical breath sounds, no wheezes, no rhonchi   CV:   RRR, grade III/VI crescendo/decrescendo murmur heard best at RSB,  no diastolic murmur  Abdomen:  soft, non-tender, no masses or organomegaly  Extremities:  warm, well-perfused, pulses palpable, mild bilateral LE edema  Rectal/GU  Deferred  Neuro:   Grossly non-focal and symmetrical throughout  Skin:   Clean and dry, no rashes, no breakdown   Diagnostic Tests:     [facility]                        [address], [place] [address]                            [phone]  ------------------------------------------------------------------- Transthoracic Echocardiography  (Report amended )  Patient:    Lorren, Rossetti MR #:       [id number] Study Date: 05/14/2017 Gender:     F Age:        38 Height:     171.5 cm Weight:     124.3 kg BSA:        2.49 m^2 Pt. Status: Room:   Clide Dales [id number]  SONOGRAPHER  Marygrace Drought, RCS  PERFORMING   [facility], Outpatient  ATTENDING    Skeet Latch, MD  [facility]     Skeet Latch, MD  REFERRING    Skeet Latch, MD  cc:  ------------------------------------------------------------------- LV EF: 55% -   60%  ------------------------------------------------------------------- Indications:      Aortic Valve Disorder (I35.0).  ------------------------------------------------------------------- History:   PMH:   Mitral valve disease.  Risk factors: Hypertension. Diabetes mellitus.  ------------------------------------------------------------------- Study Conclusions  - Left ventricle: The cavity size was normal. There was moderate   concentric hypertrophy. Systolic function was normal. The   estimated ejection fraction was in the range of  55% to 60%. Wall   motion was normal; there were no regional wall motion   abnormalities. Doppler parameters are consistent with abnormal   left ventricular relaxation (grade 1 diastolic dysfunction).   Doppler parameters are consistent with high ventricular filling   pressure. - Aortic valve: Probably trileaflet; severely thickened, severely   calcified leaflets. Valve mobility was restricted. There was   critical stenosis. There was no regurgitation. Peak velocity (S):   478 cm/s. Mean gradient (S): 62 mm Hg. Valve area (VTI): 0.57   cm^2. Valve area (Vmax): 0.65 cm^2. Valve area (Vmean): 0.51   cm^2. - Mitral valve: Severely calcified annulus. Mobility was   restricted. The findings are consistent with mild stenosis. There   was mild regurgitation. Valve area by pressure half-time: 1.93   cm^2. Valve area by continuity equation (using LVOT flow): 1.43   cm^2. - Left atrium: The atrium was severely dilated. - Right ventricle: The cavity size was normal. Wall thickness was   normal. Systolic function was normal. - Tricuspid valve: There was mild regurgitation. - Pulmonary arteries: Systolic pressure was mildly increased. PA   peak pressure: 44 mm Hg (S).  ------------------------------------------------------------------- Study data:  Comparison was made to the study of 04/21/2016.  Study status:  Routine.  Procedure:  The patient reported no pain pre or post test. Transthoracic echocardiography. Image quality was adequate.          Transthoracic echocardiography.  M-mode, complete 2D, spectral Doppler, and color Doppler.  Birthdate: Patient birthdate: [birth year].  Age:  Patient is 82 yr old.  Sex: Gender: female.    BMI: 42.3 kg/m^2.  Blood pressure:     130/78 Patient status:  Outpatient.  Study date:  Study date: 05/14/2017. Study time: 11:29 AM.  Location:  [facility]  -------------------------------------------------------------------  ------------------------------------------------------------------- Left ventricle:  The cavity size was normal. There was moderate concentric hypertrophy. Systolic function was normal. The estimated ejection fraction was in the range of 55% to 60%. Wall motion was normal; there were no regional wall motion abnormalities. Doppler parameters are consistent with abnormal left ventricular relaxation (grade 1 diastolic dysfunction). Doppler parameters are consistent with high ventricular filling pressure.  ------------------------------------------------------------------- Aortic valve:  Poorly visualized.  Probably trileaflet; severely thickened, severely calcified leaflets. Valve mobility was restricted.  Doppler:   There was critical stenosis.   There was no regurgitation.    VTI ratio of LVOT to aortic valve: 0.18. Valve area (VTI): 0.57 cm^2. Indexed valve area (VTI): 0.23 cm^2/m^2. Peak velocity ratio of LVOT to aortic valve: 0.21. Valve area (Vmax): 0.65 cm^2. Indexed valve area (Vmax): 0.26 cm^2/m^2. Mean velocity ratio of LVOT to aortic valve: 0.16. Valve area (Vmean): 0.51 cm^2. Indexed valve area (Vmean): 0.21 cm^2/m^2.    Mean gradient (S): 62 mm Hg. Peak gradient (S): 92 mm Hg.  ------------------------------------------------------------------- Aorta:  Aortic root: The aortic root was normal in size.  ------------------------------------------------------------------- Mitral valve:   Severely calcified annulus. Mobility was restricted.  Doppler:   The findings are  consistent with mild stenosis.   There was mild regurgitation.    Valve area by pressure half-time: 1.93 cm^2. Indexed valve area by pressure half-time: 0.77 cm^2/m^2. Valve area by continuity equation (using LVOT flow): 1.43 cm^2. Indexed valve area by continuity equation (using LVOT flow): 0.57 cm^2/m^2.    Mean gradient (D): 4 mm Hg.  Peak gradient (D): 7 mm Hg.  ------------------------------------------------------------------- Left atrium:  The atrium was severely dilated.  ------------------------------------------------------------------- Right ventricle:  The cavity size was normal. Wall thickness was normal. Systolic function was normal.  ------------------------------------------------------------------- Pulmonic valve:    Doppler:  Transvalvular velocity was within the normal range. There was no evidence for stenosis.  ------------------------------------------------------------------- Tricuspid valve:   Structurally normal valve.    Doppler: Transvalvular velocity was within the normal range. There was mild regurgitation.  ------------------------------------------------------------------- Pulmonary artery:   The main pulmonary artery was normal-sized. Systolic pressure was mildly increased.  ------------------------------------------------------------------- Right atrium:  The atrium was normal in size.  ------------------------------------------------------------------- Pericardium:  There was no pericardial effusion.  ------------------------------------------------------------------- Systemic veins: Inferior vena cava: The vessel was normal in size. The respirophasic diameter changes were in the normal range (= 50%), consistent with normal central venous pressure. Diameter: 21 mm.  ------------------------------------------------------------------- Measurements   IVC                                      Value          Reference  ID                                       21    mm       ----------    Left ventricle                           Value          Reference  LV ID, ED, PLAX chordal                  49.4  mm       43 - 52  LV ID, ES, PLAX chordal                  36.4  mm       23 - 38  LV fx shortening, PLAX chordal   (L)     26    %        >=29  LV PW thickness, ED                       12.46 mm       ----------  IVS/LV PW ratio, ED                      1.12           <=1.3  Stroke volume, 2D                        80    ml       ----------  Stroke volume/bsa, 2D                    32    ml/m^2   ----------  LV e&',  lateral                           5     cm/s     ----------  LV E/e&', lateral                         27.2           ----------  LV e&', medial                            5.22  cm/s     ----------  LV E/e&', medial                          26.05          ----------  LV e&', average                           5.11  cm/s     ----------  LV E/e&', average                         26.61          ----------    Ventricular septum                       Value          Reference  IVS thickness, ED                        13.97 mm       ----------    LVOT                                     Value          Reference  LVOT ID, S                               20    mm       ----------  LVOT area                                3.14  cm^2     ----------  LVOT peak velocity, S                    99    cm/s     ----------  LVOT mean velocity, S                    61.5  cm/s     ----------  LVOT VTI, S                              25.6  cm       ----------    Aortic valve                             Value          Reference  Aortic valve peak velocity, S            478   cm/s     ----------  Aortic valve mean velocity, S            376   cm/s     ----------  Aortic valve VTI, S                      140   cm       ----------  Aortic mean gradient, S                  62    mm Hg    ----------  Aortic peak gradient, S                  92    mm Hg    ----------  VTI ratio, LVOT/AV                       0.18           ----------  Aortic valve area, VTI                   0.57  cm^2     ----------  Aortic valve area/bsa, VTI               0.23  cm^2/m^2 ----------  Velocity ratio, peak, LVOT/AV            0.21           ----------  Aortic valve area, peak velocity          0.65  cm^2     ----------  Aortic valve area/bsa, peak              0.26  cm^2/m^2 ----------  velocity  Velocity ratio, mean, LVOT/AV            0.16           ----------  Aortic valve area, mean velocity         0.51  cm^2     ----------  Aortic valve area/bsa, mean              0.21  cm^2/m^2 ----------  velocity  Aortic regurg pressure half-time         751   ms       ----------    Aorta                                    Value          Reference  Aortic root ID, ED                       31    mm       ----------    Left atrium                              Value          Reference  LA ID, A-P, ES                           43    mm       ----------  LA ID/bsa, A-P  1.72  cm/m^2   <=2.2  LA volume, S                             119   ml       ----------  LA volume/bsa, S                         47.7  ml/m^2   ----------  LA volume, ES, 1-p A4C                   105   ml       ----------  LA volume/bsa, ES, 1-p A4C               42.1  ml/m^2   ----------  LA volume, ES, 1-p A2C                   136   ml       ----------  LA volume/bsa, ES, 1-p A2C               54.5  ml/m^2   ----------    Mitral valve                             Value          Reference  Mitral E-wave peak velocity              136   cm/s     ----------  Mitral A-wave peak velocity              153   cm/s     ----------  Mitral mean velocity, D                  89.2  cm/s     ----------  Mitral deceleration time         (H)     398   ms       150 - 230  Mitral pressure half-time                115   ms       ----------  Mitral mean gradient, D                  4     mm Hg    ----------  Mitral peak gradient, D                  7     mm Hg    ----------  Mitral E/A ratio, peak                   0.9            ----------  Mitral valve area, PHT, DP               1.93  cm^2     ----------  Mitral valve area/bsa, PHT, DP           0.77  cm^2/m^2 ----------  Mitral valve area, LVOT                   1.43  cm^2     ----------  continuity  Mitral valve area/bsa, LVOT              0.57  cm^2/m^2 ----------  continuity  Mitral annulus VTI, D                    56.1  cm       ----------  Mitral regurg VTI, PISA                  209   cm       ----------  Mitral ERO, PISA                         0.1   cm^2     ----------  Mitral regurg volume, PISA               21    ml       ----------    Pulmonary arteries                       Value          Reference  PA pressure, S, DP               (H)     44    mm Hg    <=30    Tricuspid valve                          Value          Reference  Tricuspid regurg peak velocity           300   cm/s     ----------  Tricuspid peak RV-RA gradient            36    mm Hg    ----------  Tricuspid maximal regurg                 300   cm/s     ----------  velocity, PISA    Right atrium                             Value          Reference  RA ID, S-I, ES, A4C              (H)     57.3  mm       34 - 49  RA area, ES, A4C                 (H)     20.6  cm^2     8.3 - 19.5  RA volume, ES, A/L                       59.9  ml       ----------  RA volume/bsa, ES, A/L                   24    ml/m^2   ----------    Systemic veins                           Value          Reference  Estimated CVP                            8     mm Hg    ----------    Right  ventricle                          Value          Reference  TAPSE                                    28.5  mm       ----------  RV pressure, S, DP               (H)     44    mm Hg    <=30  RV s&', lateral, S                        17.3  cm/s     ----------  Legend: (L)  and  (H)  mark values outside specified reference range.  ------------------------------------------------------------------- Neena Rhymes, MD 2019-03-20T11:50:38   Physicians   Panel Physicians Referring Physician Case Authorizing Physician  Sherren Mocha, MD (Primary)    Procedures   RIGHT HEART CATH AND  CORONARY ANGIOGRAPHY  Conclusion   1. Widely patent coronary arteries without obstructive CAD 2. Severely calcified aortic valve with restricted motion, known severe AS by echo 3. Severely calcified mitral valve annulus 4. Mild pulmonary HTN, elevated RA pressure, preserved cardiac output  Plan: continue multidisciplinary evaluation for treatment of severe aortic stenosis, next steps CTA studies and cardiac surgical evaluation  Indications   Severe aortic stenosis [I35.0 (ICD-10-CM)]  Procedural Details/Technique   Technical Details INDICATION: Severe symptomatic aortic stenosis  PROCEDURAL DETAILS: Ultrasound guidance was used to access the right femoral vein and a 7 Fr sheath is placed. [place] images are captured and stored in the patient's paper chart. The right wrist was then prepped, draped, and anesthetized with 1% lidocaine. Using the modified Seldinger technique a 5/6 French Slender sheath was placed in the right radial artery. Intra-arterial verapamil was administered through the radial artery sheath. IV heparin was administered after a JR4 catheter was advanced into the central aorta. A Swan-Ganz catheter was used for the right heart catheterization. Standard protocol was followed for recording of right heart pressures and sampling of oxygen saturations. Fick cardiac output was calculated. Standard Judkins catheters were used for selective coronary angiography. The aortic valve is not crossed (mean gradient 60 by echo). There were no immediate procedural complications. The patient was transferred to the post catheterization recovery area for further monitoring.   Estimated blood loss <50 mL.  During this procedure the patient was administered the following to achieve and maintain moderate conscious sedation: Versed 2 mg, Fentanyl 50 mcg, while the patient's heart rate, blood pressure, and oxygen saturation were continuously monitored. The period of conscious sedation was 19 minutes, of  which I was present face-to-face 100% of this time.  Coronary Findings   Diagnostic  Dominance: Right  Left Main  Vessel is angiographically normal.  Ramus Intermedius  Vessel is angiographically normal.  Left Circumflex  Vessel is angiographically normal.  Right Coronary Artery  Vessel is angiographically normal. Dominant vessel, no angiographic disease  Intervention   No interventions have been documented.  Right Heart   Right Heart Pressures Hemodynamic findings consistent with mild pulmonary hypertension.  Left Heart   Mitral Valve The annulus is calcified.  Aortic Valve There is severe aortic valve stenosis. The aortic valve is calcified. There is restricted  aortic valve motion.  Coronary Diagrams   Diagnostic Diagram       Implants    No implant documentation for this case.  MERGE Images   Show images for CARDIAC CATHETERIZATION   Link to Procedure Log   Procedure Log    Hemo Data    Most Recent Value  Fick Cardiac Output 7.53 L/min  Fick Cardiac Output Index 3.26 (L/min)/BSA  RA A Wave 17 mmHg  RA V Wave 14 mmHg  RA Mean 13 mmHg  RV Systolic Pressure 49 mmHg  RV Diastolic Pressure 7 mmHg  RV EDP 15 mmHg  PA Systolic Pressure 48 mmHg  PA Diastolic Pressure 22 mmHg  PA Mean 33 mmHg  PW A Wave 29 mmHg  PW V Wave 29 mmHg  PW Mean 26 mmHg  AO Systolic Pressure 948 mmHg  AO Diastolic Pressure 59 mmHg  AO Mean 83 mmHg  QP/QS 1  TPVR Index 10.13 HRUI  TSVR Index 25.46 HRUI  PVR SVR Ratio 0.1  TPVR/TSVR Ratio 0.4    ADDENDUM REPORT: 06/21/2017 23:12  CLINICAL DATA:  89 -year-old female with severe aortic stenosis being evaluated for a TAVR procedure.  EXAM: Cardiac TAVR CT  TECHNIQUE: The patient was scanned on a [employer]. A 120 kV retrospective scan was triggered in the descending thoracic aorta at 111 HU's. Gantry rotation speed was 250 msecs and collimation was .6 mm. No beta blockade or nitro were given. The 3D data set  was reconstructed in 5% intervals of the R-R cycle. Systolic and diastolic phases were analyzed on a dedicated work station using MPR, MIP and VRT modes. The patient received 80 cc of contrast.  FINDINGS: Aortic Valve: Trileaflet aortic valve with severe calcifications asymmetrically predominantly located in the non-coronary cusp with minimal extension into the LVOT.  Aorta: Normal size, no dissection, minimal calcifications.  Sinotubular Junction: 28 x 27 mm  Ascending Thoracic Aorta: 34 x 32 mm  Aortic Arch: 29 x 26 mm  Descending Thoracic Aorta: 25 x 24 mm  Sinus of Valsalva Measurements:  Non-coronary: 33 mm  Right -coronary: 30 mm  Left -coronary: 31 mm  Coronary Artery Height above Annulus:  Left Main: 14 mm  Right Coronary: 20 mm  Virtual Basal Annulus Measurements:  Maximum/Minimum Diameter: 27.4 x 21.2 mm  Mean Diameter: 23.7 mm  Perimeter: 76.5 mm  Area: 440 mm2  Optimum Fluoroscopic Angle for Delivery:  RAO 5 CRA 4  IMPRESSION: 1. Trileaflet aortic valve with severe calcifications asymmetrically predominantly located in the non-coronary cusp with minimal extension into the LVOT. Annular measurements suitable for delivery of a 26 mm Edwards-SAPIEN 3 valve.  2. Sufficient coronary to annulus distance.  3. Optimum Fluoroscopic Angle for Delivery: RAO 5 CRA 4  4. No thrombus in the left atrial appendage.  5.  Dilated pulmonary artery measuring 34 mm.   Electronically Signed   By: Ena Dawley   On: 06/21/2017 23:12   Addended by Dorothy Spark, MD on 06/21/2017 11:15 PM    Study Result   EXAM: OVER-READ INTERPRETATION  CT CHEST  The following report is an over-read performed by radiologist Dr. Vinnie Langton of [facility], [place] on 06/21/2017. This over-read does not include interpretation of cardiac or coronary anatomy or pathology. The coronary calcium score/coronary CTA interpretation by  the cardiologist is attached.  COMPARISON:  Chest CT 04/12/2016.  FINDINGS: Extracardiac findings will be described separately under dictation for contemporaneously obtained CTA chest, abdomen and pelvis.  IMPRESSION:  Please see separate dictation for contemporaneously obtained CTA chest, abdomen and pelvis for full description of relevant extracardiac findings.  Electronically Signed: By: Vinnie Langton M.D. On: 06/21/2017 08:33       CLINICAL DATA:  82 year old female with history of severe aortic stenosis. Preprocedural study prior to potential transcatheter aortic valve replacement (TAVR) procedure.  EXAM: CT ANGIOGRAPHY CHEST, ABDOMEN AND PELVIS  TECHNIQUE: Multidetector CT imaging through the chest, abdomen and pelvis was performed using the standard protocol during bolus administration of intravenous contrast. Multiplanar reconstructed images and MIPs were obtained and reviewed to evaluate the vascular anatomy.  CONTRAST:  167mL ISOVUE-370 IOPAMIDOL (ISOVUE-370) INJECTION 76%  COMPARISON:  Chest CT 04/12/2016. CT the abdomen and pelvis 12/28/2007.  FINDINGS: CTA CHEST FINDINGS  Cardiovascular: Heart size is normal. There is no significant pericardial fluid, thickening or pericardial calcification. Aortic atherosclerosis. No definite coronary artery calcifications. Severe thickening calcification of the aortic valve. Severe calcifications of the mitral annulus. Left sided pacemaker device in place with lead tips terminating in right atrial appendage and right ventricular apex.  Mediastinum/Lymph Nodes: No pathologically enlarged mediastinal or hilar lymph nodes. Esophagus is unremarkable in appearance. No axillary lymphadenopathy.  Lungs/Pleura: No suspicious pulmonary nodules or masses. No acute consolidative airspace disease. No pleural effusions.  Musculoskeletal/Soft Tissues: There are no aggressive appearing lytic or blastic lesions  noted in the visualized portions of the skeleton.  CTA ABDOMEN AND PELVIS FINDINGS  Hepatobiliary: No suspicious cystic or solid hepatic lesions. No intra or extrahepatic biliary ductal dilatation. 1.6 cm calcified gallstone lying dependently in the gallbladder. No findings to suggest an acute cholecystitis at this time.  Pancreas: No pancreatic mass. No pancreatic ductal dilatation. No pancreatic or peripancreatic fluid or inflammatory changes.  Spleen: Unremarkable.  Adrenals/Urinary Tract: Multiple large calculi are noted within the collecting systems of both kidneys, largest of which is in the upper pole of the right kidney measuring 1.9 x 1.7 x 2.0 cm. No additional calculi are noted along the course of either ureter or within the lumen of the urinary bladder. Multiple low-attenuation lesions in the left kidney, compatible with simple cysts, largest of which measures 8.4 cm in the interpolar region. Mild multifocal cortical thinning in the right kidney. No suspicious renal lesions. No hydroureteronephrosis. Urinary bladder is normal in appearance.  Stomach/Bowel: Normal appearance of the stomach. No pathologic dilatation of small bowel or colon. Numerous colonic diverticulae are noted, without surrounding inflammatory changes to suggest an acute diverticulitis at this time. Normal appendix.  Vascular/Lymphatic: Aortic atherosclerosis, without evidence of aneurysm or dissection in the abdominal or pelvic vasculature. Celiac axis, superior mesenteric artery and inferior mesenteric artery are all widely patent without hemodynamically significant stenosis. Single renal arteries bilaterally are both widely patent without hemodynamically significant stenosis. Multiple prominent borderline enlarged and mildly enlarged pelvic and retroperitoneal lymph nodes are noted, measuring up to 1.4 cm in the left common iliac distribution (axial image 148 of series 14 and 1.3 cm in  short axis in the aortocaval nodal station (axial image 126 of series 14).  Reproductive: Status post hysterectomy. Ovaries are not confidently identified may be surgically absent or atrophic.  Other: No significant volume of ascites.  No pneumoperitoneum.  Musculoskeletal: There are no aggressive appearing lytic or blastic lesions noted in the visualized portions of the skeleton.  VASCULAR MEASUREMENTS PERTINENT TO TAVR:  AORTA:  Minimal Aortic Diameter-18 x 18 mm  Severity of Aortic Calcification-mild  RIGHT PELVIS:  Right Common Iliac Artery -  Minimal Diameter-10.7 x  10.4 mm  Tortuosity-mild-to-moderate  Calcification-mild  Right External Iliac Artery -  Minimal Diameter-9.8 x 8.1 mm  Tortuosity-moderate  Calcification-none  Right Common Femoral Artery -  Minimal Diameter-8.1 x 8.3 mm  Tortuosity-mild  Calcification-mild  LEFT PELVIS:  Left Common Iliac Artery -  Minimal Diameter-10.8 x 11.3 mm  Tortuosity-moderate to severe  Calcification-mild  Left External Iliac Artery -  Minimal Diameter-8.6 x 8.4 mm  Tortuosity-moderate  Calcification-none  Left Common Femoral Artery -  Minimal Diameter-8.5 x 9.0 mm  Tortuosity-mild  Calcification-none  Review of the MIP images confirms the above findings.  IMPRESSION: 1. Vascular findings and measurements pertinent to potential TAVR procedure, as detailed above. 2. Severe thickening calcification of the aortic valve, compatible with the reported clinical history of severe aortic stenosis. 3. There is also severe thickening calcification of the mitral valve and annulus. 4. Several prominent borderline enlarged and mildly enlarged pelvic and retroperitoneal lymph nodes. These are of uncertain etiology and significance, but clinical correlation for any signs are symptoms of occult malignancy is suggested. Additionally, attention on follow-up imaging is  recommended with repeat contrast enhanced CT the abdomen and pelvis in 3-6 months. 5. Aortic atherosclerosis. 6. Numerous large nonobstructive calculi are noted within the collecting systems of both kidneys, as above. 7. Cholelithiasis without evidence of acute cholecystitis at this time. 8. Mild colonic diverticulosis without evidence of acute diverticulitis at this time. 9. Additional incidental findings, as above.  Aortic Atherosclerosis (ICD10-I70.0).   Electronically Signed   By: Vinnie Langton M.D.   On: 06/21/2017 10:31   STS Risk Calculator  Procedure: Isolated AVR CALCULATE  Risk of Mortality:  3.306%   Renal Failure:  4.755%   Permanent Stroke:  1.631%   Prolonged Ventilation:  14.392%   DSW Infection:  0.340%   Reoperation:  3.046%   Morbidity or Mortality:  20.367%   Short Length of Stay:  14.688%   Long Length of Stay:  11.318%      Impression:  This 82 year old woman has stage D, severe, symptomatic aortic stenosis with New York Heart Association class III symptoms of exertional shortness of breath and fatigue consistent with chronic diastolic congestive heart failure.  I have personally reviewed her recent transthoracic echocardiogram, cardiac catheterization, and CTA studies.  Her echo shows a trileaflet aortic valve with severe thickening and calcification with restricted leaflet mobility.  The mean transvalvular gradient is 62 mm Hg consistent with severe aortic stenosis. Left ventricular systolic function is normal. Cardiac cath shows no significant coronary artery disease. The risk of open surgical AVR would be at least moderately elevated due to her age and comorbid medical problems in addition to her limited mobility due to severe degenerative arthritis and spinal stenosis. She is also the main caregiver for her husband who is in poor health. I think TAVR would be the best treatment for her.  Her gated cardiac CTA shows anatomy suitable for  transcatheter aortic valve replacement without any significant complicating features.  The CTA of her abdomen and pelvis shows adequate pelvic vascular access to allow transfemoral insertion.  The patient was counseled at length regarding treatment alternatives for management of severe symptomatic aortic stenosis. The risks and benefits of surgical intervention has been discussed in detail. Long-term prognosis with medical therapy was discussed. Alternative approaches such as conventional surgical aortic valve replacement, transcatheter aortic valve replacement, and palliative medical therapy were compared and contrasted at length. This discussion was placed in the context of the patient's own specific clinical presentation  and past medical history. All of her questions have been addressed.   Following the decision to proceed with transcatheter aortic valve replacement, a discussion was held regarding what types of management strategies would be attempted intraoperatively in the event of life-threatening complications, including whether or not the patient would be considered a candidate for the use of cardiopulmonary bypass and/or conversion to open sternotomy for attempted surgical intervention.   The patient has been advised of a variety of complications that might develop including but not limited to risks of death, stroke, paravalvular leak, aortic dissection or other major vascular complications, aortic annulus rupture, device embolization, cardiac rupture or perforation, mitral regurgitation, acute myocardial infarction, arrhythmia, heart block or bradycardia requiring permanent pacemaker placement, congestive heart failure, respiratory failure, renal failure, pneumonia, infection, other late complications related to structural valve deterioration or migration, or other complications that might ultimately cause a temporary or permanent loss of functional independence or other long term morbidity. The  patient provides full informed consent for the procedure as described and all questions were answered.       Plan:  Transfemoral TAVR on 07/17/2017.   I spent 45 minutes performing this consultation and > 50% of this time was spent face to face counseling and coordinating the care of this patient's severe, symptomatic aortic stenosis.   Gaye Pollack, MD 07/04/2017

## 2017-07-05 ENCOUNTER — Other Ambulatory Visit: Payer: Self-pay

## 2017-07-05 DIAGNOSIS — I350 Nonrheumatic aortic (valve) stenosis: Secondary | ICD-10-CM

## 2017-07-11 ENCOUNTER — Encounter: Payer: Medicare Other | Admitting: Surgery

## 2017-07-13 ENCOUNTER — Other Ambulatory Visit: Payer: Self-pay

## 2017-07-13 ENCOUNTER — Encounter (HOSPITAL_COMMUNITY): Payer: Self-pay

## 2017-07-13 ENCOUNTER — Ambulatory Visit (HOSPITAL_COMMUNITY)
Admission: RE | Admit: 2017-07-13 | Discharge: 2017-07-13 | Disposition: A | Discharge status: Home / Self Care | Payer: Medicare Other | Source: Ambulatory Visit | Attending: Cardiovascular Disease | Admitting: Cardiovascular Disease

## 2017-07-13 ENCOUNTER — Encounter (HOSPITAL_COMMUNITY)
Admission: RE | Admit: 2017-07-13 | Discharge: 2017-07-13 | Disposition: A | Discharge status: Home / Self Care | Payer: Medicare Other | Source: Ambulatory Visit | Attending: Cardiovascular Disease | Admitting: Cardiovascular Disease

## 2017-07-13 DIAGNOSIS — Z01818 Encounter for other preprocedural examination: Secondary | ICD-10-CM | POA: Insufficient documentation

## 2017-07-13 DIAGNOSIS — Z794 Long term (current) use of insulin: Secondary | ICD-10-CM | POA: Insufficient documentation

## 2017-07-13 DIAGNOSIS — Z87891 Personal history of nicotine dependence: Secondary | ICD-10-CM | POA: Diagnosis not present

## 2017-07-13 DIAGNOSIS — Z7982 Long term (current) use of aspirin: Secondary | ICD-10-CM | POA: Insufficient documentation

## 2017-07-13 DIAGNOSIS — E119 Type 2 diabetes mellitus without complications: Secondary | ICD-10-CM | POA: Insufficient documentation

## 2017-07-13 DIAGNOSIS — I119 Hypertensive heart disease without heart failure: Secondary | ICD-10-CM | POA: Diagnosis not present

## 2017-07-13 DIAGNOSIS — I350 Nonrheumatic aortic (valve) stenosis: Secondary | ICD-10-CM

## 2017-07-13 DIAGNOSIS — Z01810 Encounter for preprocedural cardiovascular examination: Secondary | ICD-10-CM | POA: Insufficient documentation

## 2017-07-13 DIAGNOSIS — Z79899 Other long term (current) drug therapy: Secondary | ICD-10-CM | POA: Diagnosis not present

## 2017-07-13 DIAGNOSIS — I4589 Other specified conduction disorders: Secondary | ICD-10-CM | POA: Insufficient documentation

## 2017-07-13 DIAGNOSIS — Z950 Presence of cardiac pacemaker: Secondary | ICD-10-CM | POA: Insufficient documentation

## 2017-07-13 DIAGNOSIS — I700 Atherosclerosis of aorta: Secondary | ICD-10-CM | POA: Insufficient documentation

## 2017-07-13 DIAGNOSIS — I517 Cardiomegaly: Secondary | ICD-10-CM | POA: Diagnosis not present

## 2017-07-13 HISTORY — DX: Dyspnea, unspecified: R06.00

## 2017-07-13 HISTORY — DX: Cardiac murmur, unspecified: R01.1

## 2017-07-13 HISTORY — DX: Personal history of antineoplastic chemotherapy: Z92.21

## 2017-07-13 HISTORY — DX: Presence of cardiac pacemaker: Z95.0

## 2017-07-13 HISTORY — DX: Anxiety disorder, unspecified: F41.9

## 2017-07-13 HISTORY — DX: Personal history of urinary calculi: Z87.442

## 2017-07-13 LAB — APTT: APTT: 31 s (ref 24–36)

## 2017-07-13 LAB — TYPE AND SCREEN
ABO/RH(D): B POS
ANTIBODY SCREEN: NEGATIVE

## 2017-07-13 LAB — CBC
HCT: 38.8 % (ref 36.0–46.0)
HEMOGLOBIN: 12.6 g/dL (ref 12.0–15.0)
MCH: 30.1 pg (ref 26.0–34.0)
MCHC: 32.5 g/dL (ref 30.0–36.0)
MCV: 92.6 fL (ref 78.0–100.0)
Platelets: 173 10*3/uL (ref 150–400)
RBC: 4.19 MIL/uL (ref 3.87–5.11)
RDW: 14.6 % (ref 11.5–15.5)
WBC: 7.1 10*3/uL (ref 4.0–10.5)

## 2017-07-13 LAB — COMPREHENSIVE METABOLIC PANEL
ALBUMIN: 4 g/dL (ref 3.5–5.0)
ALT: 18 U/L (ref 14–54)
ANION GAP: 11 (ref 5–15)
AST: 23 U/L (ref 15–41)
Alkaline Phosphatase: 70 U/L (ref 38–126)
BUN: 21 mg/dL — ABNORMAL HIGH (ref 6–20)
CHLORIDE: 106 mmol/L (ref 101–111)
CO2: 24 mmol/L (ref 22–32)
Calcium: 9.6 mg/dL (ref 8.9–10.3)
Creatinine, Ser: 1.09 mg/dL — ABNORMAL HIGH (ref 0.44–1.00)
GFR calc non Af Amer: 47 mL/min — ABNORMAL LOW (ref >60)
GFR, EST AFRICAN AMERICAN: 54 mL/min — AB (ref >60)
GLUCOSE: 112 mg/dL — AB (ref 65–99)
Potassium: 4.5 mmol/L (ref 3.5–5.1)
SODIUM: 141 mmol/L (ref 135–145)
Total Bilirubin: 0.8 mg/dL (ref 0.3–1.2)
Total Protein: 7 g/dL (ref 6.5–8.1)

## 2017-07-13 LAB — URINALYSIS, ROUTINE W REFLEX MICROSCOPIC
Bilirubin Urine: NEGATIVE
GLUCOSE, UA: NEGATIVE mg/dL
HGB URINE DIPSTICK: NEGATIVE
KETONES UR: NEGATIVE mg/dL
NITRITE: NEGATIVE
PROTEIN: 30 mg/dL — AB
Specific Gravity, Urine: 1.018 (ref 1.005–1.030)
pH: 7 (ref 5.0–8.0)

## 2017-07-13 LAB — BLOOD GAS, ARTERIAL
ACID-BASE EXCESS: 2.5 mmol/L — AB (ref 0.0–2.0)
Bicarbonate: 26.3 mmol/L (ref 20.0–28.0)
DRAWN BY: 449841
O2 SAT: 97.2 %
PATIENT TEMPERATURE: 98.6
PH ART: 7.444 (ref 7.350–7.450)
pCO2 arterial: 39.1 mmHg (ref 32.0–48.0)
pO2, Arterial: 88.5 mmHg (ref 83.0–108.0)

## 2017-07-13 LAB — BRAIN NATRIURETIC PEPTIDE: B NATRIURETIC PEPTIDE 5: 312.1 pg/mL — AB (ref 0.0–100.0)

## 2017-07-13 LAB — HEMOGLOBIN A1C
Hgb A1c MFr Bld: 6.6 % — ABNORMAL HIGH (ref 4.8–5.6)
MEAN PLASMA GLUCOSE: 142.72 mg/dL

## 2017-07-13 LAB — ABO/RH: ABO/RH(D): B POS

## 2017-07-13 LAB — GLUCOSE, CAPILLARY: Glucose-Capillary: 103 mg/dL — ABNORMAL HIGH (ref 65–99)

## 2017-07-13 LAB — SURGICAL PCR SCREEN
MRSA, PCR: POSITIVE — AB
Staphylococcus aureus: POSITIVE — AB

## 2017-07-13 LAB — PROTIME-INR
INR: 1.09
PROTHROMBIN TIME: 14 s (ref 11.4–15.2)

## 2017-07-13 NOTE — Progress Notes (Signed)
PCP: Dr. Drema Dallas Cardiologist: Dr. Burt Knack EP: Dr. Venancio Poisson. Jude, Windle Guard, notified of upcoming surgery, he states he does not need to see before surgery.  Last device check 05-04-17.  EKG: Today CXR: Today ECHO: 05/2017 Stress Test: 2009 Cardiac Cath: 06/2017 Pt had sleep study that had to be rescheduled due to personal reason, will have study in the "next couple of months."  Fasting Blood Sugar- 150's Checks Blood Sugar twice a day  Called TAVR nurse, Lauren, and confirmed pt was not to take any insulin the morning of surgery.  Patient denies shortness of breath, fever, cough, and chest pain at PAT appointment.  Patient verbalized understanding of instructions provided today at the PAT appointment.  Patient asked to review instructions at home and day of surgery.

## 2017-07-13 NOTE — Progress Notes (Signed)
Mupirocin prescription called to [facility], pt called to pick up, verbalized understanding of usage for positive MRSA PCR screening.  Lauren TAVR nurse called and left message to review pt's labs.

## 2017-07-13 NOTE — Pre-Procedure Instructions (Signed)
[facility]  07/13/2017      [facility] [place] - [address] AT [facility] [address] [place] [address] Phone: [phone] Fax: [phone]    Your procedure is scheduled on Jul 17, 2017.  Report to [facility] Admitting at 08:00 A.M.  Call this number if you have problems the morning of surgery:  [phone]   Remember:  Do not eat food or drink liquids after midnight.   Take these medicines the morning of surgery with A SIP OF WATER : Prozac  Continue all other medications as directed by your physician except for following these medication instructions before surgery.  STOP Metformin 2 days prior to surgery, you will take last dose on 07/14/2017.  7 days prior to surgery STOP taking any Aleve, Naproxen, Ibuprofen, Motrin, Advil, Goody's, BC's, all herbal medications, fish oil, and all vitamins.     How to Manage Your Diabetes Before and After Surgery  Why is it important to control my blood sugar before and after surgery? . Improving blood sugar levels before and after surgery helps healing and can limit problems. . A way of improving blood sugar control is eating a healthy diet by: o  Eating less sugar and carbohydrates o  Increasing activity/exercise o  Talking with your doctor about reaching your blood sugar goals . High blood sugars (greater than 180 mg/dL) can raise your risk of infections and slow your recovery, so you will need to focus on controlling your diabetes during the weeks before surgery. . Make sure that the doctor who takes care of your diabetes knows about your planned surgery including the date and location.  How do I manage my blood sugar before surgery? . Check your blood sugar at least 4 times a day, starting 2 days before surgery, to make sure that the level is not too high or low. o Check your blood sugar the morning of your surgery when you wake up and every 2 hours  until you get to the Short Stay unit. . If your blood sugar is less than 70 mg/dL, you will need to treat for low blood sugar: o Do not take insulin. o Treat a low blood sugar (less than 70 mg/dL) with  cup of clear juice (cranberry or apple), 4 glucose tablets, OR glucose gel. Recheck blood sugar in 15 minutes after treatment (to make sure it is greater than 70 mg/dL). If your blood sugar is not greater than 70 mg/dL on recheck, call [phone] o  for further instructions. . Report your blood sugar to the short stay nurse when you get to Short Stay.  . If you are admitted to the hospital after surgery: o Your blood sugar will be checked by the staff and you will probably be given insulin after surgery (instead of oral diabetes medicines) to make sure you have good blood sugar levels. o The goal for blood sugar control after surgery is 80-180 mg/dL.         WHAT DO I DO ABOUT MY DIABETES MEDICATION?   [name] Kitchen Do not take oral diabetes medicines (pills) the morning of surgery.  . THE NIGHT BEFORE SURGERY, take 30-35 units of Levemir insulin. (Take a half dose of what you would normally take)  . Do NOT take any insulin the morning of your surgery.        Do not wear jewelry, make-up or nail polish.  Do  not wear lotions, powders, or perfumes, or deodorant.  Do not shave 48 hours prior to surgery.    Do not bring valuables to the hospital.   [facility] is not responsible for any belongings or valuables.  Contacts, dentures or bridgework may not be worn into surgery.  Leave your suitcase in the car.  After surgery it may be brought to your room.  For patients admitted to the hospital, discharge time will be determined by your treatment team.  Patients discharged the day of surgery will not be allowed to drive home.   Special instructions:   [facility]- Preparing For Surgery  Before surgery, you can play an important role. Because skin is not sterile, your skin needs to be as  free of germs as possible. You can reduce the number of germs on your skin by washing with CHG (chlorahexidine gluconate) Soap before surgery.  CHG is an antiseptic cleaner which kills germs and bonds with the skin to continue killing germs even after washing.  Oral Hygiene is also important to reduce your risk of infection.  Remember - BRUSH YOUR TEETH THE MORNING OF SURGERY  Please do not use if you have an allergy to CHG or antibacterial soaps. If your skin becomes reddened/irritated stop using the CHG.  Do not shave (including legs and underarms) for at least 48 hours prior to first CHG shower. It is OK to shave your face.  Please follow these instructions carefully.   1. Shower the NIGHT BEFORE SURGERY and the MORNING OF SURGERY with CHG.   2. If you chose to wash your hair, wash your hair first as usual with your normal shampoo.  3. After you shampoo, rinse your hair and body thoroughly to remove the shampoo.  4. Use CHG as you would any other liquid soap. You can apply CHG directly to the skin and wash gently with a scrungie or a clean washcloth.   5. Apply the CHG Soap to your body ONLY FROM THE NECK DOWN.  Do not use on open wounds or open sores. Avoid contact with your eyes, ears, mouth and genitals (private parts). Wash Face and genitals (private parts)  with your normal soap.  6. Wash thoroughly, paying special attention to the area where your surgery will be performed.  7. Thoroughly rinse your body with warm water from the neck down.  8. DO NOT shower/wash with your normal soap after using and rinsing off the CHG Soap.  9. Pat yourself dry with a CLEAN TOWEL.  10. Wear CLEAN PAJAMAS to bed the night before surgery, wear comfortable clothes the morning of surgery  11. Place CLEAN SHEETS on your bed the night of your first shower and DO NOT SLEEP WITH PETS.    Day of Surgery: Do not apply any deodorants/lotions. Please wear clean clothes to the hospital/surgery center.   Remember to brush your teeth.      Please read over the following fact sheets that you were given.

## 2017-07-16 MED ORDER — DOPAMINE-DEXTROSE 3.2-5 MG/ML-% IV SOLN
0.0000 ug/kg/min | INTRAVENOUS | Status: DC
  Filled 2017-07-16: qty 250

## 2017-07-16 MED ORDER — DEXMEDETOMIDINE HCL IN NACL 400 MCG/100ML IV SOLN
0.1000 ug/kg/h | INTRAVENOUS | Status: AC
  Administered 2017-07-17: 1.5 ug/kg/h via INTRAVENOUS
  Administered 2017-07-17: 11:00:00 via INTRAVENOUS
  Filled 2017-07-16: qty 100

## 2017-07-16 MED ORDER — SODIUM CHLORIDE 0.9 % IV SOLN
INTRAVENOUS | Status: DC
  Filled 2017-07-16: qty 30

## 2017-07-16 MED ORDER — EPINEPHRINE PF 1 MG/ML IJ SOLN
0.0000 ug/min | INTRAMUSCULAR | Status: DC
  Filled 2017-07-16: qty 4

## 2017-07-16 MED ORDER — NOREPINEPHRINE BITARTRATE 1 MG/ML IV SOLN
0.0000 ug/min | INTRAVENOUS | Status: AC
  Administered 2017-07-17: 2 ug/min via INTRAVENOUS
  Filled 2017-07-16: qty 4

## 2017-07-16 MED ORDER — POTASSIUM CHLORIDE 2 MEQ/ML IV SOLN
80.0000 meq | INTRAVENOUS | Status: DC
  Filled 2017-07-16: qty 40

## 2017-07-16 MED ORDER — SODIUM CHLORIDE 0.9 % IV SOLN
INTRAVENOUS | Status: DC
  Filled 2017-07-16: qty 1

## 2017-07-16 MED ORDER — NITROGLYCERIN IN D5W 200-5 MCG/ML-% IV SOLN
2.0000 ug/min | INTRAVENOUS | Status: DC
  Filled 2017-07-16: qty 250

## 2017-07-16 MED ORDER — VANCOMYCIN HCL 10 G IV SOLR
1500.0000 mg | INTRAVENOUS | Status: AC
  Administered 2017-07-17: 1500 mg via INTRAVENOUS
  Filled 2017-07-16: qty 1500

## 2017-07-16 MED ORDER — PHENYLEPHRINE HCL 10 MG/ML IJ SOLN
30.0000 ug/min | INTRAMUSCULAR | Status: DC
  Filled 2017-07-16: qty 2

## 2017-07-16 MED ORDER — MAGNESIUM SULFATE 50 % IJ SOLN
40.0000 meq | INTRAMUSCULAR | Status: DC
  Filled 2017-07-16: qty 9.85

## 2017-07-16 MED ORDER — SODIUM CHLORIDE 0.9 % IV SOLN: INTRAVENOUS | Status: DC

## 2017-07-16 MED ORDER — SODIUM CHLORIDE 0.9 % IV SOLN
1.5000 g | INTRAVENOUS | Status: AC
  Administered 2017-07-17: 1.5 g via INTRAVENOUS
  Filled 2017-07-16: qty 1.5

## 2017-07-16 NOTE — Progress Notes (Signed)
Anesthesia Chart Review:   Case:  [id number] Date/Time:  07/17/17 0948   Procedures:      TRANSCATHETER AORTIC VALVE REPLACEMENT, TRANSFEMORAL (N/A Chest)     TRANSESOPHAGEAL ECHOCARDIOGRAM (TEE) (N/A )   Anesthesia type:  General   Pre-op diagnosis:  Severe Aortic Stenosis   Location:  [facility] OR ROOM 16 / [facility]   Surgeon:  Sherren Mocha, MD      DISCUSSION:  - Pt is an 82 year old female with hx HTN, DM, prior DVT/PE, severe aortic stenosis, complete heart block status post pacemaker (St. Jude), breast cancer status post left lumpectomy, and morbid obesity.   - UA demonstrates UTI.  Dr. Burt Knack has reviewed labs and I sent a message to Lauren in his office about UTI   VS: BP (!) 115/52   Pulse (!) 58   Temp 36.8 C   Resp 20   Ht 5\' 7"  (1.702 m)   SpO2 98%   BMI 42.91 kg/m    PROVIDERS: PCP is Leighton Ruff, MD Cardiologist is Skeet Latch, MD EP cardiologist is Will Curt Bears, MD   LABS:  - UA consistent with UTI  (all labs ordered are listed, but only abnormal results are displayed)  Labs Reviewed  SURGICAL PCR SCREEN - Abnormal; Notable for the following components:      Result Value   MRSA, PCR POSITIVE (*)    Staphylococcus aureus POSITIVE (*)    All other components within normal limits  GLUCOSE, CAPILLARY - Abnormal; Notable for the following components:   Glucose-Capillary 103 (*)    All other components within normal limits  BLOOD GAS, ARTERIAL - Abnormal; Notable for the following components:   Acid-Base Excess 2.5 (*)    All other components within normal limits  BRAIN NATRIURETIC PEPTIDE - Abnormal; Notable for the following components:   B Natriuretic Peptide 312.1 (*)    All other components within normal limits  COMPREHENSIVE METABOLIC PANEL - Abnormal; Notable for the following components:   Glucose, Bld 112 (*)    BUN 21 (*)    Creatinine, Ser 1.09 (*)    GFR calc non Af Amer 47 (*)    GFR calc Af Amer 54 (*)    All other components within  normal limits  HEMOGLOBIN A1C - Abnormal; Notable for the following components:   Hgb A1c MFr Bld 6.6 (*)    All other components within normal limits  URINALYSIS, ROUTINE W REFLEX MICROSCOPIC - Abnormal; Notable for the following components:   APPearance CLOUDY (*)    Protein, ur 30 (*)    Leukocytes, UA LARGE (*)    WBC, UA >50 (*)    Bacteria, UA MANY (*)    All other components within normal limits  APTT  CBC  PROTIME-INR  TYPE AND SCREEN  ABO/RH     IMAGES:  CXR 07/13/17: No active disease. Cardiomegaly. Aortic atherosclerosis. Pacemaker   EKG: NSR. Non-specific intra-ventricular conduction block   CV:  Cardiac cath 06/13/17:  1. Widely patent coronary arteries without obstructive CAD 2. Severely calcified aortic valve with restricted motion, known severe AS by echo 3. Severely calcified mitral valve annulus 4. Mild pulmonary HTN, elevated RA pressure, preserved cardiac output  Echo 05/23/17:  - Left ventricle: The cavity size was normal. There was moderate concentric hypertrophy. Systolic function was normal. The estimated ejection fraction was in the range of 55% to 60%. Wall motion was normal; there were no regional wall motion abnormalities. Doppler parameters are consistent with  abnormal left ventricular relaxation (grade 1 diastolic dysfunction). Doppler parameters are consistent with high ventricular filling pressure. - Aortic valve: Probably trileaflet; severely thickened, severely calcified leaflets. Valve mobility was restricted. There was critical stenosis. There was no regurgitation. Peak velocity (S): 478 cm/s. Mean gradient (S): 62 mm Hg. Valve area (VTI): 0.57 cm^2. Valve area (Vmax): 0.65 cm^2. Valve area (Vmean): 0.51 cm^2. - Mitral valve: Severely calcified annulus. Mobility was restricted. The findings are consistent with mild stenosis. There was mild regurgitation. Valve area by pressure half-time: 1.93 cm^2. Valve area by continuity equation (using LVOT  flow): 1.43 cm^2. - Left atrium: The atrium was severely dilated. - Right ventricle: The cavity size was normal. Wall thickness was normal. Systolic function was normal. - Tricuspid valve: There was mild regurgitation. - Pulmonary arteries: Systolic pressure was mildly increased. PA peak pressure: 44 mm Hg (S).    Past Medical History:  Diagnosis Date  . Anxiety   . Aortic stenosis   . Arthritis   . Breast cancer [facility])    CANCER  . Diabetes mellitus    Type II  . Dyspnea   . Heart murmur   . History of chemotherapy   . History of kidney stones   . Hypertension   . Mitral stenosis   . Morbid obesity (HCC)    BMI > 40  . Ovarian cancer ([place])   . Presence of permanent cardiac pacemaker    St. Jude  . Pulmonary embolism ([place]) 2013   post op  . Spinal stenosis     Past Surgical History:  Procedure Laterality Date  . ABDOMINAL HYSTERECTOMY  2013  . BREAST SURGERY  03-2004   lumpectomy   . CARDIAC CATHETERIZATION    . INSERT / REPLACE / REMOVE PACEMAKER    . LITHOTRIPSY    . PACEMAKER IMPLANT N/A 04/21/2016   Procedure: Pacemaker Implant;  Surgeon: Will Meredith Leeds, MD;  Location: [place] [facility];  Service: Cardiovascular;  Laterality: N/A;  . TONSILLECTOMY      MEDICATIONS: . acetaminophen (TYLENOL) 650 MG CR tablet  . aspirin 81 MG tablet  . atorvastatin (LIPITOR) 20 MG tablet  . Calcium Citrate-Vitamin D (CALCIUM CITRATE + PO)  . cholecalciferol (VITAMIN D) 1000 units tablet  . fish oil-omega-3 fatty acids 1000 MG capsule  . FLUoxetine (PROZAC) 40 MG capsule  . furosemide (LASIX) 40 MG tablet  . LEVEMIR FLEXTOUCH 100 UNIT/ML Pen  . losartan (COZAAR) 50 MG tablet  . Magnesium 400 MG CAPS  . metFORMIN (GLUCOPHAGE) 1000 MG tablet  . metoprolol tartrate (LOPRESSOR) 25 MG tablet  . Multiple Vitamin (MULTIVITAMIN) tablet  . olmesartan (BENICAR) 20 MG tablet   No current facility-administered medications for this encounter.    Derrill Memo ON 07/17/2017] 0.9 %   sodium chloride infusion  . [START ON 07/17/2017] cefUROXime (ZINACEF) 1.5 g in sodium chloride 0.9 % 100 mL IVPB  . [START ON 07/17/2017] dexmedetomidine (PRECEDEX) 400 MCG/100ML (4 mcg/mL) infusion  . [START ON 07/17/2017] DOPamine (INTROPIN) 800 mg in dextrose 5 % 250 mL (3.2 mg/mL) infusion  . [START ON 07/17/2017] EPINEPHrine (ADRENALIN) 4 mg in dextrose 5 % 250 mL (0.016 mg/mL) infusion  . [START ON 07/17/2017] heparin 30,000 units/NS 1000 mL solution for CELLSAVER  . [START ON 07/17/2017] insulin regular (NOVOLIN R,HUMULIN R) 100 Units in sodium chloride 0.9 % 100 mL (1 Units/mL) infusion  . [START ON 07/17/2017] magnesium sulfate (IV Push/IM) injection 40 mEq  . [START ON 07/17/2017] nitroGLYCERIN 50 mg  in dextrose 5 % 250 mL (0.2 mg/mL) infusion  . [START ON 07/17/2017] norepinephrine (LEVOPHED) 4 mg in dextrose 5 % 250 mL (0.016 mg/mL) infusion  . [START ON 07/17/2017] phenylephrine (NEO-SYNEPHRINE) 20 mg in sodium chloride 0.9 % 250 mL (0.08 mg/mL) infusion  . [START ON 07/17/2017] potassium chloride injection 80 mEq  . [START ON 07/17/2017] vancomycin (VANCOCIN) 1,500 mg in sodium chloride 0.9 % 250 mL IVPB    If no changes, I anticipate pt can proceed with surgery as scheduled.   Willeen Cass, FNP-BC [facility]/Anesthesiology Phone: [phone] 07/16/2017 10:17 AM

## 2017-07-17 ENCOUNTER — Encounter (HOSPITAL_COMMUNITY)
Admission: RE | Disposition: A | Discharge status: Home / Self Care | Payer: Self-pay | Source: Ambulatory Visit | Attending: Cardiovascular Disease

## 2017-07-17 ENCOUNTER — Inpatient Hospital Stay (HOSPITAL_COMMUNITY)
Admission: RE | Admit: 2017-07-17 | Discharge: 2017-07-18 | DRG: 267 | Disposition: A | Discharge status: Home / Self Care | Payer: Medicare Other | Source: Ambulatory Visit | Attending: Cardiovascular Disease | Admitting: Cardiovascular Disease

## 2017-07-17 ENCOUNTER — Ambulatory Visit (HOSPITAL_COMMUNITY)
Admission: RE | Admit: 2017-07-17 | Discharge: 2017-07-17 | Disposition: A | Discharge status: Home / Self Care | Payer: Medicare Other | Source: Ambulatory Visit | Attending: Cardiovascular Disease | Admitting: Cardiovascular Disease

## 2017-07-17 ENCOUNTER — Encounter (HOSPITAL_COMMUNITY): Payer: Self-pay | Admitting: *Deleted

## 2017-07-17 ENCOUNTER — Other Ambulatory Visit: Payer: Self-pay

## 2017-07-17 ENCOUNTER — Inpatient Hospital Stay (HOSPITAL_COMMUNITY): Payer: Medicare Other

## 2017-07-17 ENCOUNTER — Inpatient Hospital Stay (HOSPITAL_COMMUNITY): Payer: Medicare Other | Admitting: Emergency Medicine

## 2017-07-17 ENCOUNTER — Inpatient Hospital Stay (HOSPITAL_COMMUNITY): Payer: Medicare Other | Admitting: Anesthesiology

## 2017-07-17 DIAGNOSIS — I5032 Chronic diastolic (congestive) heart failure: Secondary | ICD-10-CM | POA: Diagnosis present

## 2017-07-17 DIAGNOSIS — I110 Hypertensive heart disease with heart failure: Secondary | ICD-10-CM | POA: Diagnosis present

## 2017-07-17 DIAGNOSIS — I10 Essential (primary) hypertension: Secondary | ICD-10-CM | POA: Diagnosis not present

## 2017-07-17 DIAGNOSIS — Z91048 Other nonmedicinal substance allergy status: Secondary | ICD-10-CM

## 2017-07-17 DIAGNOSIS — Z006 Encounter for examination for normal comparison and control in clinical research program: Secondary | ICD-10-CM

## 2017-07-17 DIAGNOSIS — Z950 Presence of cardiac pacemaker: Secondary | ICD-10-CM

## 2017-07-17 DIAGNOSIS — I5033 Acute on chronic diastolic (congestive) heart failure: Secondary | ICD-10-CM | POA: Diagnosis not present

## 2017-07-17 DIAGNOSIS — I350 Nonrheumatic aortic (valve) stenosis: Secondary | ICD-10-CM | POA: Diagnosis not present

## 2017-07-17 DIAGNOSIS — Z6841 Body Mass Index (BMI) 40.0 and over, adult: Secondary | ICD-10-CM | POA: Diagnosis not present

## 2017-07-17 DIAGNOSIS — M4800 Spinal stenosis, site unspecified: Secondary | ICD-10-CM | POA: Diagnosis present

## 2017-07-17 DIAGNOSIS — I700 Atherosclerosis of aorta: Secondary | ICD-10-CM | POA: Diagnosis not present

## 2017-07-17 DIAGNOSIS — I272 Pulmonary hypertension, unspecified: Secondary | ICD-10-CM | POA: Diagnosis present

## 2017-07-17 DIAGNOSIS — Z87891 Personal history of nicotine dependence: Secondary | ICD-10-CM

## 2017-07-17 DIAGNOSIS — I442 Atrioventricular block, complete: Secondary | ICD-10-CM | POA: Diagnosis present

## 2017-07-17 DIAGNOSIS — Z86718 Personal history of other venous thrombosis and embolism: Secondary | ICD-10-CM | POA: Diagnosis not present

## 2017-07-17 DIAGNOSIS — Z86711 Personal history of pulmonary embolism: Secondary | ICD-10-CM

## 2017-07-17 DIAGNOSIS — E785 Hyperlipidemia, unspecified: Secondary | ICD-10-CM | POA: Diagnosis present

## 2017-07-17 DIAGNOSIS — Z8543 Personal history of malignant neoplasm of ovary: Secondary | ICD-10-CM | POA: Diagnosis not present

## 2017-07-17 DIAGNOSIS — IMO0001 Reserved for inherently not codable concepts without codable children: Secondary | ICD-10-CM

## 2017-07-17 DIAGNOSIS — M160 Bilateral primary osteoarthritis of hip: Secondary | ICD-10-CM | POA: Diagnosis present

## 2017-07-17 DIAGNOSIS — E119 Type 2 diabetes mellitus without complications: Secondary | ICD-10-CM | POA: Diagnosis present

## 2017-07-17 DIAGNOSIS — Z853 Personal history of malignant neoplasm of breast: Secondary | ICD-10-CM

## 2017-07-17 DIAGNOSIS — I080 Rheumatic disorders of both mitral and aortic valves: Secondary | ICD-10-CM | POA: Diagnosis present

## 2017-07-17 DIAGNOSIS — Z794 Long term (current) use of insulin: Secondary | ICD-10-CM | POA: Diagnosis not present

## 2017-07-17 DIAGNOSIS — I340 Nonrheumatic mitral (valve) insufficiency: Secondary | ICD-10-CM | POA: Diagnosis not present

## 2017-07-17 DIAGNOSIS — I050 Rheumatic mitral stenosis: Secondary | ICD-10-CM | POA: Diagnosis present

## 2017-07-17 DIAGNOSIS — Z952 Presence of prosthetic heart valve: Secondary | ICD-10-CM | POA: Diagnosis not present

## 2017-07-17 HISTORY — DX: Presence of prosthetic heart valve: Z95.2

## 2017-07-17 HISTORY — PX: TRANSCATHETER AORTIC VALVE REPLACEMENT, TRANSFEMORAL: SHX6400

## 2017-07-17 HISTORY — PX: INTRAOPERATIVE TRANSTHORACIC ECHOCARDIOGRAM: SHX6523

## 2017-07-17 LAB — POCT I-STAT 3, ART BLOOD GAS (G3+)
Bicarbonate: 27 mmol/L (ref 20.0–28.0)
O2 Saturation: 100 %
Patient temperature: 97.7
TCO2: 29 mmol/L (ref 22–32)
pCO2 arterial: 54.8 mmHg — ABNORMAL HIGH (ref 32.0–48.0)
pH, Arterial: 7.298 — ABNORMAL LOW (ref 7.350–7.450)
pO2, Arterial: 201 mmHg — ABNORMAL HIGH (ref 83.0–108.0)

## 2017-07-17 LAB — POCT I-STAT 4, (NA,K, GLUC, HGB,HCT)
Glucose, Bld: 161 mg/dL — ABNORMAL HIGH (ref 65–99)
HCT: 30 % — ABNORMAL LOW (ref 36.0–46.0)
Hemoglobin: 10.2 g/dL — ABNORMAL LOW (ref 12.0–15.0)
Potassium: 4.1 mmol/L (ref 3.5–5.1)
Sodium: 143 mmol/L (ref 135–145)

## 2017-07-17 LAB — POCT I-STAT, CHEM 8
BUN: 20 mg/dL (ref 6–20)
BUN: 19 mg/dL (ref 6–20)
Calcium, Ion: 1.26 mmol/L (ref 1.15–1.40)
Calcium, Ion: 1.23 mmol/L (ref 1.15–1.40)
Chloride: 104 mmol/L (ref 101–111)
Chloride: 104 mmol/L (ref 101–111)
Creatinine, Ser: 0.8 mg/dL (ref 0.44–1.00)
Creatinine, Ser: 0.9 mg/dL (ref 0.44–1.00)
Glucose, Bld: 144 mg/dL — ABNORMAL HIGH (ref 65–99)
Glucose, Bld: 169 mg/dL — ABNORMAL HIGH (ref 65–99)
HCT: 33 % — ABNORMAL LOW (ref 36.0–46.0)
HCT: 32 % — ABNORMAL LOW (ref 36.0–46.0)
Hemoglobin: 11.2 g/dL — ABNORMAL LOW (ref 12.0–15.0)
Hemoglobin: 10.9 g/dL — ABNORMAL LOW (ref 12.0–15.0)
Potassium: 4.2 mmol/L (ref 3.5–5.1)
Potassium: 4.2 mmol/L (ref 3.5–5.1)
Sodium: 142 mmol/L (ref 135–145)
Sodium: 142 mmol/L (ref 135–145)
TCO2: 26 mmol/L (ref 22–32)
TCO2: 26 mmol/L (ref 22–32)

## 2017-07-17 LAB — GLUCOSE, CAPILLARY
GLUCOSE-CAPILLARY: 145 mg/dL — AB (ref 65–99)
Glucose-Capillary: 128 mg/dL — ABNORMAL HIGH (ref 65–99)
Glucose-Capillary: 127 mg/dL — ABNORMAL HIGH (ref 65–99)

## 2017-07-17 SURGERY — IMPLANTATION, AORTIC VALVE, TRANSCATHETER, FEMORAL APPROACH
Anesthesia: Monitor Anesthesia Care | Site: Chest

## 2017-07-17 MED ORDER — MIDAZOLAM HCL 2 MG/2ML IJ SOLN: 2.0000 mg | INTRAMUSCULAR | Status: DC | PRN

## 2017-07-17 MED ORDER — ACETAMINOPHEN 500 MG PO TABS
1000.0000 mg | ORAL_TABLET | Freq: Three times a day (TID) | ORAL | Status: DC | PRN
  Administered 2017-07-18: 1000 mg via ORAL
  Filled 2017-07-17: qty 2

## 2017-07-17 MED ORDER — HEPARIN SODIUM (PORCINE) 1000 UNIT/ML IJ SOLN
INTRAMUSCULAR | Status: DC | PRN
  Administered 2017-07-17: 19000 [IU] via INTRAVENOUS

## 2017-07-17 MED ORDER — SODIUM CHLORIDE 0.9 % IV SOLN: 250.0000 mL | INTRAVENOUS | Status: DC | PRN

## 2017-07-17 MED ORDER — SODIUM CHLORIDE 0.9 % IV SOLN
INTRAVENOUS | Status: AC
  Filled 2017-07-17 (×3): qty 1.2

## 2017-07-17 MED ORDER — MORPHINE SULFATE (PF) 2 MG/ML IV SOLN: 2.0000 mg | INTRAVENOUS | Status: DC | PRN

## 2017-07-17 MED ORDER — LIDOCAINE HCL 1 % IJ SOLN
INTRAMUSCULAR | Status: DC | PRN
  Administered 2017-07-17: 15 mL

## 2017-07-17 MED ORDER — SODIUM CHLORIDE 0.9 % IV SOLN
1.0000 mL/kg/h | INTRAVENOUS | Status: AC
  Administered 2017-07-17: 1 mL/kg/h via INTRAVENOUS

## 2017-07-17 MED ORDER — ONDANSETRON HCL 4 MG/2ML IJ SOLN
INTRAMUSCULAR | Status: AC
  Filled 2017-07-17: qty 2

## 2017-07-17 MED ORDER — CHLORHEXIDINE GLUCONATE 0.12 % MT SOLN: 15.0000 mL | Freq: Once | OROMUCOSAL | Status: DC

## 2017-07-17 MED ORDER — ASPIRIN EC 81 MG PO TBEC
81.0000 mg | DELAYED_RELEASE_TABLET | Freq: Every day | ORAL | Status: DC
  Administered 2017-07-18: 81 mg via ORAL
  Filled 2017-07-17: qty 1

## 2017-07-17 MED ORDER — TRAMADOL HCL 50 MG PO TABS: 50.0000 mg | ORAL_TABLET | ORAL | Status: DC | PRN

## 2017-07-17 MED ORDER — LIDOCAINE HCL (PF) 1 % IJ SOLN
INTRAMUSCULAR | Status: AC
  Filled 2017-07-17: qty 30

## 2017-07-17 MED ORDER — VANCOMYCIN HCL IN DEXTROSE 1-5 GM/200ML-% IV SOLN
1000.0000 mg | Freq: Once | INTRAVENOUS | Status: AC
  Administered 2017-07-17: 1000 mg via INTRAVENOUS
  Filled 2017-07-17: qty 200

## 2017-07-17 MED ORDER — HEPARIN SODIUM (PORCINE) 1000 UNIT/ML IJ SOLN
INTRAMUSCULAR | Status: AC
  Filled 2017-07-17: qty 1

## 2017-07-17 MED ORDER — FENTANYL CITRATE (PF) 100 MCG/2ML IJ SOLN
INTRAMUSCULAR | Status: DC | PRN
  Administered 2017-07-17: 50 ug via INTRAVENOUS

## 2017-07-17 MED ORDER — NITROGLYCERIN IN D5W 200-5 MCG/ML-% IV SOLN: 0.0000 ug/min | INTRAVENOUS | Status: DC

## 2017-07-17 MED ORDER — LACTATED RINGERS IV SOLN
INTRAVENOUS | Status: DC | PRN
  Administered 2017-07-17: 10:00:00 via INTRAVENOUS

## 2017-07-17 MED ORDER — ONDANSETRON HCL 4 MG/2ML IJ SOLN: 4.0000 mg | Freq: Four times a day (QID) | INTRAMUSCULAR | Status: DC | PRN

## 2017-07-17 MED ORDER — MUPIROCIN 2 % EX OINT
1.0000 "application " | TOPICAL_OINTMENT | Freq: Two times a day (BID) | CUTANEOUS | Status: DC
  Administered 2017-07-17 – 2017-07-18 (×2): 1 via NASAL
  Filled 2017-07-17: qty 22

## 2017-07-17 MED ORDER — LACTATED RINGERS IV SOLN
INTRAVENOUS | Status: DC
  Administered 2017-07-17 (×2): via INTRAVENOUS

## 2017-07-17 MED ORDER — MIDAZOLAM HCL 5 MG/5ML IJ SOLN
INTRAMUSCULAR | Status: DC | PRN
  Administered 2017-07-17: 1 mg via INTRAVENOUS

## 2017-07-17 MED ORDER — PROTAMINE SULFATE 10 MG/ML IV SOLN
INTRAVENOUS | Status: AC
  Filled 2017-07-17: qty 50

## 2017-07-17 MED ORDER — CHLORHEXIDINE GLUCONATE 4 % EX LIQD: 60.0000 mL | Freq: Once | CUTANEOUS | Status: DC

## 2017-07-17 MED ORDER — FLUOXETINE HCL 20 MG PO CAPS
40.0000 mg | ORAL_CAPSULE | Freq: Every day | ORAL | Status: DC
  Administered 2017-07-18: 40 mg via ORAL
  Filled 2017-07-17 (×2): qty 2

## 2017-07-17 MED ORDER — SODIUM CHLORIDE 0.9 % IJ SOLN
INTRAMUSCULAR | Status: AC
  Filled 2017-07-17: qty 10

## 2017-07-17 MED ORDER — CHLORHEXIDINE GLUCONATE 0.12 % MT SOLN
OROMUCOSAL | Status: AC
  Administered 2017-07-17: 15 mL
  Filled 2017-07-17: qty 15

## 2017-07-17 MED ORDER — LACTATED RINGERS IV SOLN: 500.0000 mL | Freq: Once | INTRAVENOUS | Status: DC | PRN

## 2017-07-17 MED ORDER — SODIUM CHLORIDE 0.9 % IV SOLN
0.0000 ug/min | INTRAVENOUS | Status: DC
  Filled 2017-07-17: qty 2

## 2017-07-17 MED ORDER — FENTANYL CITRATE (PF) 250 MCG/5ML IJ SOLN
INTRAMUSCULAR | Status: AC
  Filled 2017-07-17: qty 5

## 2017-07-17 MED ORDER — SODIUM CHLORIDE 0.9 % IV SOLN
INTRAVENOUS | Status: DC | PRN
  Administered 2017-07-17: 500 mL
  Administered 2017-07-17: 1000 mL

## 2017-07-17 MED ORDER — PROPOFOL 500 MG/50ML IV EMUL
INTRAVENOUS | Status: DC | PRN
  Administered 2017-07-17: 10 ug/kg/min via INTRAVENOUS

## 2017-07-17 MED ORDER — OXYCODONE HCL 5 MG PO TABS: 5.0000 mg | ORAL_TABLET | ORAL | Status: DC | PRN

## 2017-07-17 MED ORDER — PROPOFOL 10 MG/ML IV BOLUS
INTRAVENOUS | Status: DC | PRN
  Administered 2017-07-17: 15 mg via INTRAVENOUS
  Administered 2017-07-17: 30 mg via INTRAVENOUS

## 2017-07-17 MED ORDER — MIDAZOLAM HCL 2 MG/2ML IJ SOLN
INTRAMUSCULAR | Status: AC
  Filled 2017-07-17: qty 2

## 2017-07-17 MED ORDER — ONDANSETRON HCL 4 MG/2ML IJ SOLN
INTRAMUSCULAR | Status: DC | PRN
  Administered 2017-07-17: 4 mg via INTRAVENOUS

## 2017-07-17 MED ORDER — FENTANYL CITRATE (PF) 100 MCG/2ML IJ SOLN
100.0000 ug | Freq: Once | INTRAMUSCULAR | Status: AC
  Administered 2017-07-17 (×2): 50 ug via INTRAVENOUS

## 2017-07-17 MED ORDER — ALBUMIN HUMAN 5 % IV SOLN: 250.0000 mL | INTRAVENOUS | Status: AC | PRN

## 2017-07-17 MED ORDER — DEXMEDETOMIDINE HCL IN NACL 200 MCG/50ML IV SOLN
INTRAVENOUS | Status: DC | PRN
  Administered 2017-07-17 (×2): 61.72 ug via INTRAVENOUS

## 2017-07-17 MED ORDER — IODIXANOL 320 MG/ML IV SOLN
INTRAVENOUS | Status: DC | PRN
  Administered 2017-07-17: 60.3 mL via INTRAVENOUS

## 2017-07-17 MED ORDER — HEPARIN SODIUM (PORCINE) 1000 UNIT/ML IJ SOLN
INTRAMUSCULAR | Status: AC
  Filled 2017-07-17: qty 3

## 2017-07-17 MED ORDER — INSULIN ASPART 100 UNIT/ML ~~LOC~~ SOLN
0.0000 [IU] | Freq: Three times a day (TID) | SUBCUTANEOUS | Status: DC
  Administered 2017-07-17: 2 [IU] via SUBCUTANEOUS
  Administered 2017-07-17: 3 [IU] via SUBCUTANEOUS
  Administered 2017-07-18 (×2): 2 [IU] via SUBCUTANEOUS
  Administered 2017-07-18: 3 [IU] via SUBCUTANEOUS

## 2017-07-17 MED ORDER — ATORVASTATIN CALCIUM 20 MG PO TABS
20.0000 mg | ORAL_TABLET | Freq: Every day | ORAL | Status: DC
  Administered 2017-07-17: 20 mg via ORAL
  Filled 2017-07-17: qty 1

## 2017-07-17 MED ORDER — METOPROLOL TARTRATE 5 MG/5ML IV SOLN: 2.5000 mg | INTRAVENOUS | Status: DC | PRN

## 2017-07-17 MED ORDER — CHLORHEXIDINE GLUCONATE 4 % EX LIQD: 30.0000 mL | CUTANEOUS | Status: DC

## 2017-07-17 MED ORDER — SODIUM CHLORIDE 0.9% FLUSH
3.0000 mL | Freq: Two times a day (BID) | INTRAVENOUS | Status: DC
  Administered 2017-07-17 – 2017-07-18 (×2): 3 mL via INTRAVENOUS

## 2017-07-17 MED ORDER — FENTANYL CITRATE (PF) 100 MCG/2ML IJ SOLN
INTRAMUSCULAR | Status: AC
  Administered 2017-07-17: 50 ug via INTRAVENOUS
  Filled 2017-07-17: qty 2

## 2017-07-17 MED ORDER — SODIUM CHLORIDE 0.9 % IV SOLN
1.5000 g | Freq: Two times a day (BID) | INTRAVENOUS | Status: DC
  Administered 2017-07-17 – 2017-07-18 (×3): 1.5 g via INTRAVENOUS
  Filled 2017-07-17 (×4): qty 1.5

## 2017-07-17 MED ORDER — PROPOFOL 10 MG/ML IV BOLUS
INTRAVENOUS | Status: AC
  Filled 2017-07-17: qty 20

## 2017-07-17 MED ORDER — METOPROLOL TARTRATE 12.5 MG HALF TABLET
12.5000 mg | ORAL_TABLET | Freq: Two times a day (BID) | ORAL | Status: DC
  Administered 2017-07-17 – 2017-07-18 (×2): 12.5 mg via ORAL
  Filled 2017-07-17 (×3): qty 1

## 2017-07-17 MED ORDER — DEXMEDETOMIDINE HCL IN NACL 200 MCG/50ML IV SOLN
INTRAVENOUS | Status: AC
  Filled 2017-07-17: qty 50

## 2017-07-17 MED ORDER — PROTAMINE SULFATE 10 MG/ML IV SOLN
INTRAVENOUS | Status: DC | PRN
  Administered 2017-07-17: 180 mg via INTRAVENOUS
  Administered 2017-07-17: 10 mg via INTRAVENOUS

## 2017-07-17 MED ORDER — CHLORHEXIDINE GLUCONATE CLOTH 2 % EX PADS
6.0000 | MEDICATED_PAD | Freq: Every day | CUTANEOUS | Status: DC
  Administered 2017-07-18: 6 via TOPICAL

## 2017-07-17 MED ORDER — SODIUM CHLORIDE 0.9% FLUSH: 3.0000 mL | INTRAVENOUS | Status: DC | PRN

## 2017-07-17 SURGICAL SUPPLY — 85 items
ADH SKN CLS APL DERMABOND .7 (GAUZE/BANDAGES/DRESSINGS) ×3
ADH SKN CLS LQ APL DERMABOND (GAUZE/BANDAGES/DRESSINGS) ×3
BAG DECANTER FOR FLEXI CONT (MISCELLANEOUS) ×3 IMPLANT
BAG SNAP BAND KOVER 36X36 (MISCELLANEOUS) ×5 IMPLANT
BLADE CLIPPER SURG (BLADE) IMPLANT
CABLE ADAPT CONN TEMP 6FT (ADAPTER) ×5 IMPLANT
CANISTER SUCT 3000ML PPV (MISCELLANEOUS) ×3 IMPLANT
CATH DIAG EXPO 6F VENT PIG 145 (CATHETERS) ×10 IMPLANT
CATH EXPO 5FR AL1 (CATHETERS) ×3 IMPLANT
CATH S G BIP PACING (SET/KITS/TRAYS/PACK) ×5 IMPLANT
CLIP VESOCCLUDE MED 24/CT (CLIP) ×5 IMPLANT
CLIP VESOCCLUDE SM WIDE 24/CT (CLIP) ×5 IMPLANT
CONT SPEC 4OZ CLIKSEAL STRL BL (MISCELLANEOUS) ×10 IMPLANT
COVER BACK TABLE 80X110 HD (DRAPES) ×10 IMPLANT
COVER DOME SNAP 22 D (MISCELLANEOUS) IMPLANT
CRADLE DONUT ADULT HEAD (MISCELLANEOUS) ×5 IMPLANT
DERMABOND ADHESIVE PROPEN (GAUZE/BANDAGES/DRESSINGS) ×2
DERMABOND ADVANCED (GAUZE/BANDAGES/DRESSINGS) ×2
DERMABOND ADVANCED .7 DNX12 (GAUZE/BANDAGES/DRESSINGS) ×3 IMPLANT
DERMABOND ADVANCED .7 DNX6 (GAUZE/BANDAGES/DRESSINGS) ×1 IMPLANT
DEVICE CLOSURE PERCLS PRGLD 6F (VASCULAR PRODUCTS) ×6 IMPLANT
DRAPE INCISE IOBAN 66X45 STRL (DRAPES) IMPLANT
DRSG TEGADERM 4X4.75 (GAUZE/BANDAGES/DRESSINGS) ×7 IMPLANT
ELECT REM PT RETURN 9FT ADLT (ELECTROSURGICAL) ×10
ELECTRODE REM PT RTRN 9FT ADLT (ELECTROSURGICAL) ×6 IMPLANT
FELT TEFLON 6X6 (MISCELLANEOUS) ×5 IMPLANT
FEMORAL VENOUS CANN RAP (CANNULA) IMPLANT
GAUZE SPONGE 4X4 12PLY STRL (GAUZE/BANDAGES/DRESSINGS) ×8 IMPLANT
GLOVE BIO SURGEON STRL SZ7.5 (GLOVE) IMPLANT
GLOVE BIO SURGEON STRL SZ8 (GLOVE) IMPLANT
GLOVE EUDERMIC 7 POWDERFREE (GLOVE) IMPLANT
GLOVE ORTHO TXT STRL SZ7.5 (GLOVE) IMPLANT
GOWN STRL REUS W/ TWL LRG LVL3 (GOWN DISPOSABLE) IMPLANT
GOWN STRL REUS W/ TWL XL LVL3 (GOWN DISPOSABLE) ×3 IMPLANT
GOWN STRL REUS W/TWL LRG LVL3 (GOWN DISPOSABLE)
GOWN STRL REUS W/TWL XL LVL3 (GOWN DISPOSABLE) ×5
GUIDEWIRE SAF TJ AMPL .035X180 (WIRE) ×5 IMPLANT
GUIDEWIRE SAFE TJ AMPLATZ EXST (WIRE) ×5 IMPLANT
GUIDEWIRE STRAIGHT .035 260CM (WIRE) ×5 IMPLANT
INSERT FOGARTY SM (MISCELLANEOUS) IMPLANT
KIT BASIN OR (CUSTOM PROCEDURE TRAY) ×5 IMPLANT
KIT DILATOR VASC 18G NDL (KITS) IMPLANT
KIT HEART LEFT (KITS) ×5 IMPLANT
KIT SUCTION CATH 14FR (SUCTIONS) ×10 IMPLANT
KIT TURNOVER KIT B (KITS) ×5 IMPLANT
LOOP VESSEL MAXI BLUE (MISCELLANEOUS) IMPLANT
LOOP VESSEL MINI RED (MISCELLANEOUS) IMPLANT
NDL PERC 18GX7CM (NEEDLE) ×2 IMPLANT
NEEDLE PERC 18GX7CM (NEEDLE) ×5 IMPLANT
NS IRRIG 1000ML POUR BTL (IV SOLUTION) ×15 IMPLANT
PACK ENDOVASCULAR (PACKS) ×5 IMPLANT
PAD ARMBOARD 7.5X6 YLW CONV (MISCELLANEOUS) ×10 IMPLANT
PAD ELECT DEFIB RADIOL ZOLL (MISCELLANEOUS) ×5 IMPLANT
PENCIL BUTTON HOLSTER BLD 10FT (ELECTRODE) ×5 IMPLANT
PERCLOSE PROGLIDE 6F (VASCULAR PRODUCTS) ×10
SET MICROPUNCTURE 5F STIFF (MISCELLANEOUS) ×5 IMPLANT
SHEATH BRITE TIP 6FR 35CM (SHEATH) ×5 IMPLANT
SHEATH PINNACLE 6F 10CM (SHEATH) IMPLANT
SHEATH PINNACLE 8F 10CM (SHEATH) ×5 IMPLANT
SLEEVE REPOSITIONING LENGTH 30 (MISCELLANEOUS) ×5 IMPLANT
SPONGE LAP 4X18 X RAY DECT (DISPOSABLE) ×5 IMPLANT
STOPCOCK MORSE 400PSI 3WAY (MISCELLANEOUS) ×10 IMPLANT
SUT ETHIBOND X763 2 0 SH 1 (SUTURE) IMPLANT
SUT GORETEX CV 4 TH 22 36 (SUTURE) IMPLANT
SUT GORETEX CV4 TH-18 (SUTURE) IMPLANT
SUT MNCRL AB 3-0 PS2 18 (SUTURE) IMPLANT
SUT PROLENE 5 0 C 1 36 (SUTURE) IMPLANT
SUT PROLENE 6 0 C 1 30 (SUTURE) IMPLANT
SUT SILK  1 MH (SUTURE) ×2
SUT SILK 1 MH (SUTURE) ×3 IMPLANT
SUT VIC AB 2-0 CT1 27 (SUTURE)
SUT VIC AB 2-0 CT1 TAPERPNT 27 (SUTURE) IMPLANT
SUT VIC AB 2-0 CTX 36 (SUTURE) IMPLANT
SUT VIC AB 3-0 SH 8-18 (SUTURE) IMPLANT
SYR 50ML LL SCALE MARK (SYRINGE) ×5 IMPLANT
SYR BULB IRRIGATION 50ML (SYRINGE) IMPLANT
SYR CONTROL 10ML LL (SYRINGE) IMPLANT
TAPE CLOTH SURG 6X10 WHT LF (GAUZE/BANDAGES/DRESSINGS) ×3 IMPLANT
TOWEL GREEN STERILE (TOWEL DISPOSABLE) ×10 IMPLANT
TRANSDUCER W/STOPCOCK (MISCELLANEOUS) ×10 IMPLANT
TRAY FOLEY SLVR 14FR TEMP STAT (SET/KITS/TRAYS/PACK) IMPLANT
TUBE SUCT INTRACARD DLP 20F (MISCELLANEOUS) IMPLANT
VALVE HEART TRANSCATH SZ3 26MM (Prosthesis & Implant Heart) ×3 IMPLANT
WIRE .035 3MM-J 145CM (WIRE) ×5 IMPLANT
WIRE BENTSON .035X145CM (WIRE) ×5 IMPLANT

## 2017-07-17 NOTE — Op Note (Signed)
HEART AND VASCULAR CENTER   MULTIDISCIPLINARY HEART VALVE TEAM   TAVR OPERATIVE NOTE   Date of Procedure:  07/17/2017  Preoperative Diagnosis: Severe Aortic Stenosis   Postoperative Diagnosis: Same   Procedure:    Transcatheter Aortic Valve Replacement - Percutaneous  Transfemoral Approach  Edwards Sapien 3 THV (size 26 mm, model # 9600TFX, serial # [id number])   Co-Surgeons:  Valentina Gu. Roxy Manns, MD and Sherren Mocha, MD  Anesthesiologist:  Adele Barthel, MD  Echocardiographer:  Ena Dawley, MD  Pre-operative Echo Findings:  Severe aortic stenosis  Normal left ventricular systolic function  Post-operative Echo Findings:  Trivial paravalvular leak  Normal (unchanged) left ventricular systolic function  BRIEF CLINICAL NOTE AND INDICATIONS FOR SURGERY  Please see the complete note of Dr Roxy Manns for background history.  During the course of the patient's preoperative work up they have been evaluated comprehensively by a multidisciplinary team of specialists coordinated through the [facility] in the [facility].  They have been demonstrated to suffer from symptomatic severe aortic stenosis as noted above. The patient has been counseled extensively as to the relative risks and benefits of all options for the treatment of severe aortic stenosis including long term medical therapy, conventional surgery for aortic valve replacement, and transcatheter aortic valve replacement.  The patient has been independently evaluated by two cardiac surgeons including Dr. Roxy Manns and Dr. Cyndia Bent, and they are felt to be at moderate risk for conventional surgical aortic valve replacement.  Based upon review of all of the patient's preoperative diagnostic tests they are felt to be candidate for transcatheter aortic valve replacement using the transfemoral approach as an alternative to high risk conventional surgery.    Following the decision to proceed  with transcatheter aortic valve replacement, a discussion has been held regarding what types of management strategies would be attempted intraoperatively in the event of life-threatening complications, including whether or not the patient would be considered a candidate for the use of cardiopulmonary bypass and/or conversion to open sternotomy for attempted surgical intervention.  The patient has been advised of a variety of complications that might develop peculiar to this approach including but not limited to risks of death, stroke, paravalvular leak, aortic dissection or other major vascular complications, aortic annulus rupture, device embolization, cardiac rupture or perforation, acute myocardial infarction, arrhythmia, heart block or bradycardia requiring permanent pacemaker placement, congestive heart failure, respiratory failure, renal failure, pneumonia, infection, other late complications related to structural valve deterioration or migration, or other complications that might ultimately cause a temporary or permanent loss of functional independence or other long term morbidity.  The patient provides full informed consent for the procedure as described and all questions were answered preoperatively.  DETAILS OF THE OPERATIVE PROCEDURE  PREPARATION:   The patient is brought to the operating room on the above mentioned date and central monitoring was established by the anesthesia team including placement of a central venous catheter and radial arterial line. The patient is placed in the supine position on the operating table.  Intravenous antibiotics are administered. The patient is monitored closely throughout the procedure under conscious sedation.  Baseline transthoracic echocardiogram is performed. The patient's chest, abdomen, both groins, and both lower extremities are prepared and draped in a sterile manner. A time out procedure is performed.   PERIPHERAL ACCESS:   Using ultrasound  guidance, femoral arterial and venous access is obtained with placement of 6 Fr sheaths on the left side.  A pigtail diagnostic catheter  was passed through the femoral arterial sheath under fluoroscopic guidance into the aortic root.  A temporary transvenous pacemaker catheter was passed through the femoral venous sheath under fluoroscopic guidance into the right ventricle.  The pacemaker was tested to ensure stable lead placement and pacemaker capture. Aortic root angiography was performed in order to determine the optimal angiographic angle for valve deployment.  TRANSFEMORAL ACCESS:  A micropuncture technique is used to access the right femoral artery under fluoroscopic and ultrasound guidance.  2 Perclose devices are deployed at 10' and 2' positions to 'PreClose' the femoral artery. An 8 French sheath is placed and then an Amplatz Superstiff wire is advanced through the sheath. This is changed out for a 14 French transfemoral E-Sheath after progressively dilating over the Superstiff wire.  An AL-1 catheter was used to direct a straight-tip exchange length wire across the native aortic valve into the left ventricle. This was exchanged out for a pigtail catheter and position was confirmed in the LV apex.  Simultaneous LV and Ao pressures were recorded.  The pigtail catheter was exchanged for an Amplatz Extra-stiff wire in the LV apex.  Echocardiography was utilized to confirm appropriate wire position and no sign of entanglement in the mitral subvalvular apparatus.  BALLOON AORTIC VALVULOPLASTY:  Not performed  TRANSCATHETER HEART VALVE DEPLOYMENT:  An Edwards Sapien 3 transcatheter heart valve (size 26 mm, model #9600TFX, serial #[id number]) was prepared and crimped per manufacturer's guidelines, and the proper orientation of the valve is confirmed on the [employer] delivery system. The valve was advanced through the introducer sheath using normal technique until in an appropriate position in the  abdominal aorta beyond the sheath tip. The balloon was then retracted and using the fine-tuning wheel was centered on the valve. The valve was then advanced across the aortic arch using appropriate flexion of the catheter. The valve was carefully positioned across the aortic valve annulus. The valve was difficult to advance across the annulus and one additional cc of contrast is administered into the Atrion device to help the valve advance. The Commander catheter was retracted using normal technique. Once final position of the valve has been confirmed by angiographic assessment, the valve is deployed while temporarily holding ventilation and during rapid ventricular pacing to maintain systolic blood pressure < 50 mmHg and pulse pressure < 10 mmHg. The balloon inflation is held for >3 seconds after reaching full deployment volume. Once the balloon has fully deflated the balloon is retracted into the ascending aorta and valve function is assessed using echocardiography. There is felt to be trivial paravalvular leak and no central aortic insufficiency.  The patient's hemodynamic recovery following valve deployment is good.  The deployment balloon and guidewire are both removed. Echo demostrated acceptable post-procedural gradients (mean 4 mmHg), stable mitral valve function, and trivial aortic insufficiency.  PROCEDURE COMPLETION:  The sheath was removed and femoral artery closure is performed using the 2 previously deployed Perclose devices.  Protamine is administered once femoral arterial repair was complete. The site is clear with no evidence of bleeding or hematoma after the sutures are tightened. The temporary pacemaker, pigtail catheters and femoral sheaths were removed with manual pressure used for hemostasis.   The patient tolerated the procedure well and is transported to the surgical intensive care in stable condition. There were no immediate intraoperative complications. All sponge instrument and needle  counts are verified correct at completion of the operation.   The patient received a total of 60.7 mL of intravenous contrast during the  procedure.  Sherren Mocha, MD 07/17/2017 12:53 PM

## 2017-07-17 NOTE — Progress Notes (Signed)
  Echocardiogram 2D Echocardiogram has been performed.  Matilde Bash 07/17/2017, 1:07 PM

## 2017-07-17 NOTE — Anesthesia Procedure Notes (Signed)
Central Venous Catheter Insertion Performed by: Murvin Natal, MD, anesthesiologist Start/End5/14/2019 9:40 AM, 07/17/2017 9:50 AM Patient location: Pre-op. Preanesthetic checklist: patient identified, IV checked, site marked, risks and benefits discussed, surgical consent, monitors and equipment checked, pre-op evaluation, timeout performed and anesthesia consent Position: Trendelenburg Lidocaine 1% used for infiltration and patient sedated Hand hygiene performed  and maximum sterile barriers used  Catheter size: 7 Fr Total catheter length 20. Central line was placed.Triple lumen Procedure performed using ultrasound guided technique. Ultrasound Notes:anatomy identified, needle tip was noted to be adjacent to the nerve/plexus identified, no ultrasound evidence of intravascular and/or intraneural injection and image(s) printed for medical record Attempts: 2 Following insertion, dressing applied, line sutured and Biopatch. Post procedure assessment: blood return through all ports, free fluid flow and no air  Patient tolerated the procedure well with no immediate complications.

## 2017-07-17 NOTE — Addendum Note (Signed)
Addendum  created 07/17/17 2115 by Murvin Natal, MD   Sign clinical note

## 2017-07-17 NOTE — Anesthesia Preprocedure Evaluation (Addendum)
Anesthesia Evaluation  Patient identified by MRN, date of birth, ID band Patient awake    Reviewed: Allergy & Precautions, NPO status , Patient's Chart, lab work & pertinent test results  Airway Mallampati: II  TM Distance: >3 FB Neck ROM: Full    Dental  (+) Upper Dentures   Pulmonary former smoker,    Pulmonary exam normal breath sounds clear to auscultation       Cardiovascular hypertension, Pt. on medications and Pt. on home beta blockers + pacemaker + Valvular Problems/Murmurs AS  Rhythm:Regular Rate:Normal + Systolic murmurs ECG: NSR, rate 64  CATH: 1. Widely patent coronary arteries without obstructive CAD 2. Severely calcified aortic valve with restricted motion, known severe AS by echo 3. Severely calcified mitral valve annulus 4. Mild pulmonary HTN, elevated RA pressure, preserved cardiac output  ECHO: LV EF: 55% -   60% Left ventricle: The cavity size was normal. There was moderate concentric hypertrophy. Systolic function was normal. The estimated ejection fraction was in the range of 55% to 60%. Wall motion was normal; there were no regional wall motion abnormalities. Doppler parameters are consistent with abnormal left ventricular relaxation (grade 1 diastolic dysfunction). Doppler parameters are consistent with high ventricular filling pressure. Aortic valve: Probably trileaflet; severely thickened, severely calcified leaflets. Valve mobility was restricted. There was critical stenosis. There was no regurgitation. Peak velocity (S): 478 cm/s. Mean gradient (S): 62 mm Hg. Valve area (VTI): 0.57 cm^2. Valve area (Vmax): 0.65 cm^2. Valve area (Vmean): 0.51 cm^2. Mitral valve: Severely calcified annulus. Mobility was restricted. The findings are consistent with mild stenosis. There was mild regurgitation. Valve area by pressure half-time: 1.93 cm^2. Valve area by continuity equation (using LVOT flow): 1.43 cm^2. Left  atrium: The atrium was severely dilated. Right ventricle: The cavity size was normal. Wall thickness was normal. Systolic function was normal. Tricuspid valve: There was mild regurgitation. Pulmonary arteries: Systolic pressure was mildly increased. PA peak pressure: 44 mm Hg (S).         Neuro/Psych Anxiety negative neurological ROS     GI/Hepatic negative GI ROS, Neg liver ROS,   Endo/Other  diabetes, Insulin DependentMorbid obesity  Renal/GU negative Renal ROS     Musculoskeletal negative musculoskeletal ROS (+)   Abdominal   Peds  Hematology HLD   Anesthesia Other Findings Severe Aortic Stenosis  Reproductive/Obstetrics                            Anesthesia Physical Anesthesia Plan  ASA: IV  Anesthesia Plan: MAC   Post-op Pain Management:    Induction: Intravenous  PONV Risk Score and Plan: 2 and Ondansetron, Dexamethasone and Treatment may vary due to age or medical condition  Airway Management Planned: Simple Face Mask  Additional Equipment: Arterial line, CVP and Ultrasound Guidance Line Placement  Intra-op Plan:   Post-operative Plan:   Informed Consent: I have reviewed the patients History and Physical, chart, labs and discussed the procedure including the risks, benefits and alternatives for the proposed anesthesia with the patient or authorized representative who has indicated his/her understanding and acceptance.   Dental advisory given  Plan Discussed with: CRNA  Anesthesia Plan Comments:         Anesthesia Quick Evaluation

## 2017-07-17 NOTE — Anesthesia Procedure Notes (Signed)
Procedure Name: MAC Date/Time: 07/17/2017 10:02 AM Performed by: White, Amedeo Plenty, CRNA Pre-anesthesia Checklist: Patient identified, Emergency Drugs available, Suction available and Patient being monitored Patient Re-evaluated:Patient Re-evaluated prior to induction Oxygen Delivery Method: Simple face mask

## 2017-07-17 NOTE — Anesthesia Procedure Notes (Signed)
Arterial Line Insertion Start/End5/14/2019 9:00 AM, 07/17/2017 9:10 AM Performed by: Moshe Salisbury, CRNA, CRNA  Patient location: Pre-op. Preanesthetic checklist: patient identified, IV checked, site marked, risks and benefits discussed, surgical consent, monitors and equipment checked, pre-op evaluation, timeout performed and anesthesia consent Lidocaine 1% used for infiltration and patient sedated Right, radial was placed Catheter size: 20 G Hand hygiene performed , maximum sterile barriers used  and Seldinger technique used  Attempts: 1 Procedure performed without using ultrasound guided technique. Following insertion, dressing applied and Biopatch. Post procedure assessment: normal  Patient tolerated the procedure well with no immediate complications.

## 2017-07-17 NOTE — Transfer of Care (Signed)
Immediate Anesthesia Transfer of Care Note  Patient: [name]  Procedure(s) Performed: TRANSCATHETER AORTIC VALVE REPLACEMENT, TRANSFEMORAL (N/A Chest) INTRAOPERATIVE TRANSTHORACIC ECHOCARDIOGRAM  Patient Location: ICU  Anesthesia Type:MAC  Level of Consciousness: awake, alert  and patient cooperative  Airway & Oxygen Therapy: Patient Spontanous Breathing and Patient connected to face mask oxygen  Post-op Assessment: Report given to RN and Post -op Vital signs reviewed and stable  Post vital signs: Reviewed and stable  Last Vitals:  Vitals Value Taken Time  BP    Temp    Pulse    Resp    SpO2      Last Pain: There were no vitals filed for this visit.       Complications: No apparent anesthesia complications

## 2017-07-17 NOTE — Interval H&P Note (Signed)
History and Physical Interval Note:  07/17/2017 8:38 AM  [name]  has presented today for surgery, with the diagnosis of Severe Aortic Stenosis  The various methods of treatment have been discussed with the patient and family. After consideration of risks, benefits and other options for treatment, the patient has consented to  Procedure(s): TRANSCATHETER AORTIC VALVE REPLACEMENT, TRANSFEMORAL (N/A) TRANSESOPHAGEAL ECHOCARDIOGRAM (TEE) (N/A) as a surgical intervention .  The patient's history has been reviewed, patient examined, no change in status, stable for surgery.  I have reviewed the patient's chart and labs.  Questions were answered to the patient's satisfaction.    Ms Kuwahara reports no change in symptoms since Dr Vivi Martens recent evaluation. Plan for TAVR via a transfemoral approach. All questions answered.   Sherren Mocha

## 2017-07-17 NOTE — Progress Notes (Signed)
TAVR Team Note:  Pt doing well. Family at bedside.  BL groin sites clear - no hematoma. On low-dose neo - plan wean as tolerated.   Sherren Mocha 07/17/2017 3:59 PM

## 2017-07-17 NOTE — Op Note (Signed)
[facility]   MULTIDISCIPLINARY HEART VALVE TEAM   TAVR OPERATIVE NOTE   Date of Procedure:  07/17/2017  Preoperative Diagnosis: Severe Aortic Stenosis   Postoperative Diagnosis: Same   Procedure:    Transcatheter Aortic Valve Replacement - Percutaneous Percutaneous Right Transfemoral Approach  Edwards Sapien 3 THV (size 26 mm, model # 9600TFX, serial # [id number])   Co-Surgeons:  Valentina Gu. Roxy Manns, MD and Sherren Mocha, MD  Anesthesiologist:  Adele Barthel, MD  Pre-operative Echo Findings:  Severe aortic stenosis  Normal left ventricular systolic function  Post-operative Echo Findings:  Mild paravalvular leak  Normal left ventricular systolic function   BRIEF CLINICAL NOTE AND INDICATIONS FOR SURGERY  Patient is an 82 year old obese female with history of aortic stenosis, complete heart block status post permanent pacemaker placement, hypertension, chronic diastolic congestive heart failure, type 2 diabetes mellitus, ovarian cancer, breast cancer, spinal stenosis, degenerative arthritis, and decreased physical mobility with been referred for surgical consultation to discuss treatment options for management of severe symptomatic aortic stenosis.  The patient's cardiac history dates back to February 2018 when she was hospitalized with worsening shortness of breath and dizzy spells.  She was in complete heart block at the time and underwent permanent pacemaker placement.  Transthoracic echocardiogram performed at that time revealed the presence of normal left ventricular systolic function with moderate aortic stenosis.  Since then she has been followed regularly by Dr. Oval Linsey.  Follow-up echocardiogram performed significant progression and severity of aortic stenosis.  Peak velocity across aortic valve was measured 4.8 m/s corresponding mean transvalvular gradient estimated 62 mmHg.  The DVI was reported 0.21.  Left ventricular systolic function remain normal with  ejection fraction estimated 55 to 60%.  She was also felt to have mild mitral stenosis and mild to moderate mitral regurgitation with mean transvalvular gradient across the mitral valve estimated 4 mmHg and mitral ERO measured 0.1 cm corresponding to a regurgitant volume of 21 mL.  She was referred to the multidisciplinary heart valve clinic and underwent diagnostic cardiac catheterization by Dr. Burt Knack on June 13, 2017.  She was found to have widely patent coronary arteries with no significant coronary artery disease.  There was mild pulmonary hypertension with preserved cardiac output.  CT angiography was performed and the patient was referred for surgical consultation.  During the course of the patient's preoperative work up they have been evaluated comprehensively by a multidisciplinary team of specialists coordinated through the [facility] in the [facility].  They have been demonstrated to suffer from symptomatic severe aortic stenosis as noted above. The patient has been counseled extensively as to the relative risks and benefits of all options for the treatment of severe aortic stenosis including long term medical therapy, conventional surgery for aortic valve replacement, and transcatheter aortic valve replacement.  All questions have been answered, and the patient provides full informed consent for the operation as described.   DETAILS OF THE OPERATIVE PROCEDURE  PREPARATION:    The patient is brought to the operating room on the above mentioned date and central monitoring was established by the anesthesia team including placement of a central venous line and radial arterial line. The patient is placed in the supine position on the operating table.  Intravenous antibiotics are administered. The patient is monitored closely throughout the procedure under conscious sedation.   Baseline transthoracic echocardiogram was performed. The patient's  chest, abdomen, both groins, and both lower extremities are prepared and draped in  a sterile manner. A time out procedure is performed.   PERIPHERAL ACCESS:    Using the modified Seldinger technique, femoral arterial and venous access was obtained with placement of 6 Fr sheaths on the left side.  A pigtail diagnostic catheter was passed through the left arterial sheath under fluoroscopic guidance into the aortic root.  A temporary transvenous pacemaker catheter was passed through the left femoral venous sheath under fluoroscopic guidance into the right ventricle.  The pacemaker was tested to ensure stable lead placement and pacemaker capture. Aortic root angiography was performed in order to determine the optimal angiographic angle for valve deployment.   TRANSFEMORAL ACCESS:   Percutaneous transfemoral access and sheath placement was performed by Dr. Burt Knack using ultrasound guidance.  The right common femoral artery was cannulated using a micropuncture needle and appropriate location was verified using hand injection angiogram.  A pair of Abbott Perclose percutaneous closure devices were placed and a 6 French sheath replaced into the femoral artery.  The patient was heparinized systemically and ACT verified > 250 seconds.    A 14 Fr transfemoral E-sheath was introduced into the right common femoral artery after progressively dilating over an Amplatz superstiff wire. An AL-1 catheter was used to direct a straight-tip exchange length wire across the native aortic valve into the left ventricle. This was exchanged out for a pigtail catheter and position was confirmed in the LV apex. Simultaneous LV and Ao pressures were recorded.  The pigtail catheter was exchanged for an Amplatz Extra-stiff wire in the LV apex.  Echocardiography was utilized to confirm appropriate wire position and no sign of entanglement in the mitral subvalvular apparatus.   TRANSCATHETER HEART VALVE DEPLOYMENT:   An Edwards Sapien  3 transcatheter heart valve (size 26 mm, model #9600TFX, serial #[id number]) was prepared and crimped per manufacturer's guidelines, and the proper orientation of the valve is confirmed on the [employer] delivery system. The valve was advanced through the introducer sheath using normal technique until in an appropriate position in the abdominal aorta beyond the sheath tip. The balloon was then retracted and using the fine-tuning wheel was centered on the valve. The valve was then advanced across the aortic arch using appropriate flexion of the catheter. The valve was carefully positioned across the aortic valve annulus. The Commander catheter was retracted using normal technique. Once final position of the valve has been confirmed by angiographic assessment, the valve is deployed while temporarily holding ventilation and during rapid ventricular pacing to maintain systolic blood pressure < 50 mmHg and pulse pressure < 10 mmHg. The balloon inflation is held for >3 seconds after reaching full deployment volume. Once the balloon has fully deflated the balloon is retracted into the ascending aorta and valve function is assessed using echocardiography. There is felt to be mild paravalvular leak and no central aortic insufficiency.  The patient's hemodynamic recovery following valve deployment is good.  The deployment balloon and guidewire are both removed.    PROCEDURE COMPLETION:   The sheath was removed and femoral artery closure performed by Dr Burt Knack.  Protamine was administered once femoral arterial repair was complete. The temporary pacemaker, pigtail catheters and femoral sheaths were removed with manual pressure used for hemostasis.   The patient tolerated the procedure well and is transported to the surgical intensive care in stable condition. There were no immediate intraoperative complications. All sponge instrument and needle counts are verified correct at completion of the operation.   No blood  products were administered during the operation.  The patient received a total of 60.7 mL of intravenous contrast during the procedure.   Rexene Alberts, MD 07/17/2017 12:22 PM

## 2017-07-17 NOTE — Progress Notes (Signed)
Sheath removed from left femoral artery and left femoral vein.  Pressure held x 20 minutes.  Site looks good with no hematoma.  Dressed with 4x4 and tape.  Vitals stable throughout sheath removal.  Instructions given for bedrest.  RN will continue to monitor site.

## 2017-07-17 NOTE — Anesthesia Postprocedure Evaluation (Addendum)
Anesthesia Post Note  Patient: [name]  Procedure(s) Performed: TRANSCATHETER AORTIC VALVE REPLACEMENT, TRANSFEMORAL (N/A Chest) INTRAOPERATIVE TRANSTHORACIC ECHOCARDIOGRAM     Patient location during evaluation: ICU Anesthesia Type: MAC Level of consciousness: awake and alert Pain management: pain level controlled Vital Signs Assessment: post-procedure vital signs reviewed and stable Respiratory status: spontaneous breathing, nonlabored ventilation, respiratory function stable and patient connected to nasal cannula oxygen Cardiovascular status: stable and blood pressure returned to baseline Postop Assessment: no apparent nausea or vomiting Anesthetic complications: no    Last Vitals:  Vitals:   07/17/17 1800 07/17/17 1900  BP: (!) 113/52 (!) 113/47  Pulse: 67 67  Resp: 18 19  Temp:    SpO2: 91% 95%    Last Pain:  Vitals:   07/17/17 1900  TempSrc:   PainSc: 0-No pain                 Mariyah Upshaw P [name]

## 2017-07-18 ENCOUNTER — Other Ambulatory Visit: Payer: Self-pay

## 2017-07-18 ENCOUNTER — Encounter (HOSPITAL_COMMUNITY): Payer: Self-pay | Admitting: Cardiovascular Disease

## 2017-07-18 ENCOUNTER — Inpatient Hospital Stay (HOSPITAL_COMMUNITY): Payer: Medicare Other

## 2017-07-18 DIAGNOSIS — Z952 Presence of prosthetic heart valve: Secondary | ICD-10-CM

## 2017-07-18 DIAGNOSIS — I340 Nonrheumatic mitral (valve) insufficiency: Secondary | ICD-10-CM

## 2017-07-18 DIAGNOSIS — I350 Nonrheumatic aortic (valve) stenosis: Secondary | ICD-10-CM

## 2017-07-18 DIAGNOSIS — I5033 Acute on chronic diastolic (congestive) heart failure: Secondary | ICD-10-CM

## 2017-07-18 LAB — ECHOCARDIOGRAM COMPLETE
Height: 67 in
Weight: 4352 oz

## 2017-07-18 LAB — CBC
HEMATOCRIT: 33 % — AB (ref 36.0–46.0)
Hemoglobin: 10.6 g/dL — ABNORMAL LOW (ref 12.0–15.0)
MCH: 29.6 pg (ref 26.0–34.0)
MCHC: 32.1 g/dL (ref 30.0–36.0)
MCV: 92.2 fL (ref 78.0–100.0)
Platelets: 130 10*3/uL — ABNORMAL LOW (ref 150–400)
RBC: 3.58 MIL/uL — ABNORMAL LOW (ref 3.87–5.11)
RDW: 14.2 % (ref 11.5–15.5)
WBC: 7.8 10*3/uL (ref 4.0–10.5)

## 2017-07-18 LAB — MAGNESIUM: Magnesium: 1.7 mg/dL (ref 1.7–2.4)

## 2017-07-18 LAB — GLUCOSE, CAPILLARY
GLUCOSE-CAPILLARY: 145 mg/dL — AB (ref 65–99)
GLUCOSE-CAPILLARY: 150 mg/dL — AB (ref 65–99)
Glucose-Capillary: 137 mg/dL — ABNORMAL HIGH (ref 65–99)

## 2017-07-18 LAB — BASIC METABOLIC PANEL
Anion gap: 7 (ref 5–15)
BUN: 16 mg/dL (ref 6–20)
CO2: 26 mmol/L (ref 22–32)
CREATININE: 0.95 mg/dL (ref 0.44–1.00)
Calcium: 8.7 mg/dL — ABNORMAL LOW (ref 8.9–10.3)
Chloride: 106 mmol/L (ref 101–111)
GFR calc Af Amer: >60 mL/min (ref >60)
GFR calc non Af Amer: 55 mL/min — ABNORMAL LOW (ref >60)
GLUCOSE: 153 mg/dL — AB (ref 65–99)
Potassium: 4.3 mmol/L (ref 3.5–5.1)
Sodium: 139 mmol/L (ref 135–145)

## 2017-07-18 MED ORDER — CLOPIDOGREL BISULFATE 75 MG PO TABS
75.0000 mg | ORAL_TABLET | Freq: Every day | ORAL | Status: DC
  Administered 2017-07-18: 75 mg via ORAL
  Filled 2017-07-18: qty 1

## 2017-07-18 MED ORDER — FUROSEMIDE 10 MG/ML IJ SOLN
40.0000 mg | Freq: Once | INTRAMUSCULAR | Status: AC
  Administered 2017-07-18: 40 mg via INTRAVENOUS
  Filled 2017-07-18: qty 4

## 2017-07-18 MED ORDER — CLOPIDOGREL BISULFATE 75 MG PO TABS: 75.0000 mg | ORAL_TABLET | Freq: Every day | ORAL | 6 refills | Status: DC

## 2017-07-18 MED FILL — Sodium Chloride IV Soln 0.9%: INTRAVENOUS | Qty: 250 | Status: AC

## 2017-07-18 MED FILL — Magnesium Sulfate Inj 50%: INTRAMUSCULAR | Qty: 10 | Status: AC

## 2017-07-18 MED FILL — Phenylephrine HCl IV Soln 10 MG/ML: INTRAVENOUS | Qty: 2 | Status: AC

## 2017-07-18 MED FILL — Heparin Sodium (Porcine) Inj 1000 Unit/ML: INTRAMUSCULAR | Qty: 30 | Status: AC

## 2017-07-18 MED FILL — Potassium Chloride Inj 2 mEq/ML: INTRAVENOUS | Qty: 40 | Status: AC

## 2017-07-18 NOTE — Progress Notes (Signed)
Great thank you!

## 2017-07-18 NOTE — Progress Notes (Signed)
Pt given discharge instructions with understanding. Husband at bedside. Pt has no questions at this time. IV and monitor d/c. Pt d/c to vehicle by wheelchair.

## 2017-07-18 NOTE — Progress Notes (Signed)
Progress Note  Patient Name: [name] Date of Encounter: 07/18/2017  Primary Cardiologist: No primary care provider on file.   Subjective   Feels well other than not sleeping well last night. No CP or dyspnea.  Inpatient Medications    Scheduled Meds: . aspirin EC  81 mg Oral Daily  . atorvastatin  20 mg Oral QHS  . Chlorhexidine Gluconate Cloth  6 each Topical Q0600  . FLUoxetine  40 mg Oral Daily  . insulin aspart  0-15 Units Subcutaneous TID WC  . metoprolol tartrate  12.5 mg Oral BID  . mupirocin ointment  1 application Nasal BID  . sodium chloride flush  3 mL Intravenous Q12H   Continuous Infusions: . sodium chloride    . albumin human    . cefUROXime (ZINACEF)  IV Stopped (07/18/17 0441)   PRN Meds: sodium chloride, acetaminophen, albumin human, metoprolol tartrate, morphine injection, ondansetron (ZOFRAN) IV, oxyCODONE, sodium chloride flush, traMADol   Vital Signs    Vitals:   07/18/17 0600 07/18/17 0700 07/18/17 0850 07/18/17 0900  BP: (!) 133/44 (!) 135/53 (!) 123/48 (!) 137/47  Pulse: 73  73 80  Resp:  (!) 22 (!) 24 20  Temp:      TempSrc:      SpO2: 95%  93% 94%  Weight:      Height:        Intake/Output Summary (Last 24 hours) at 07/18/2017 1017 Last data filed at 07/18/2017 0850 Gross per 24 hour  Intake 2040.66 ml  Output 915 ml  Net 1125.66 ml   Filed Weights   07/17/17 0838  Weight: 272 lb (123.4 kg)    Telemetry    Sinus rhythm with intermittent pacing - Personally Reviewed  ECG    NSR, RBBB - Personally Reviewed  Physical Exam  Alert, oriented woman in NAD GEN: No acute distress.   Neck: No JVD Cardiac: RRR, 2/6 SEM at the RUSB, no diastolic murmur Respiratory: Clear to auscultation bilaterally. GI: Soft, nontender, non-distended  MS: 1+ bilateral pretibial edema; No deformity. Neuro:  Nonfocal  Psych: Normal affect   Labs    Chemistry Recent Labs  Lab 07/13/17 0959 07/17/17 1037 07/17/17 1156 07/17/17 1224  07/18/17 0420  NA 141 142 142 143 139  K 4.5 4.2 4.2 4.1 4.3  CL 106 104 104  --  106  CO2 24  --   --   --  26  GLUCOSE 112* 144* 169* 161* 153*  BUN 21* 20 19  --  16  CREATININE 1.09* 0.80 0.90  --  0.95  CALCIUM 9.6  --   --   --  8.7*  PROT 7.0  --   --   --   --   ALBUMIN 4.0  --   --   --   --   AST 23  --   --   --   --   ALT 18  --   --   --   --   ALKPHOS 70  --   --   --   --   BILITOT 0.8  --   --   --   --   GFRNONAA 47*  --   --   --  55*  GFRAA 54*  --   --   --  >60  ANIONGAP 11  --   --   --  7     Hematology Recent Labs  Lab 07/13/17 0959  07/17/17 1156 07/17/17  1224 07/18/17 0420  WBC 7.1  --   --   --  7.8  RBC 4.19  --   --   --  3.58*  HGB 12.6   < > 10.9* 10.2* 10.6*  HCT 38.8   < > 32.0* 30.0* 33.0*  MCV 92.6  --   --   --  92.2  MCH 30.1  --   --   --  29.6  MCHC 32.5  --   --   --  32.1  RDW 14.6  --   --   --  14.2  PLT 173  --   --   --  130*   < > = values in this interval not displayed.    Cardiac EnzymesNo results for input(s): TROPONINI in the last 168 hours. No results for input(s): TROPIPOC in the last 168 hours.   BNP Recent Labs  Lab 07/13/17 1000  BNP 312.1*     DDimer No results for input(s): DDIMER in the last 168 hours.   Radiology    Dg Chest Port 1 View  Result Date: 07/17/2017 CLINICAL DATA:  Status post transcatheter aortic valve replacement today. EXAM: PORTABLE CHEST 1 VIEW COMPARISON:  PA and lateral chest x-ray of Jul 13, 2017 FINDINGS: The lungs are well-expanded. The interstitial markings are mildly prominent. The cardiac silhouette remains enlarged. The pulmonary vascularity is mildly engorged. The ICD is in stable position. The right internal jugular venous catheter terminates over the midportion of the SVC. There is calcification in the wall of the aortic arch and descending thoracic aorta. IMPRESSION: No postprocedure complication following transcatheter aortic valve replacement. Low-grade CHF not greatly  changed from the previous study. Thoracic aortic atherosclerosis. Electronically Signed   By: David  Martinique M.D.   On: 07/17/2017 13:20    Cardiac Studies   POD #1 echo pending  Patient Profile     82 y.o. female with Severe, Stage D1 aortic stenosis presenting 5/14 for TAVR  Assessment & Plan    1. Severe aortic stenosis - s/p TAVR. Treat with ASA and plavix. FU echo today. Exam suggestive of normal valve function.   2. Acute on chronic diastolic CHF: prior to surgery, pt with dyspnea at 50 feet of walking, elevated BNP. CXR with mild interstitial edema and pulmonary vascular prominence. Will give single dose IV lasix this am then start back on home dose of 60 mg daily  3. HTN: BP controlled. Resume home Rx.  4. DM, Type II: resume home Rx at discharge. CBG's reviewed and stable.   Dispo: diurese this am. If POD #1 echo looks ok and patient stable with ambulation, could DC this afternoon. Will recheck later today.  For questions or updates, please contact [place] Please consult [email or web address] for contact info under Cardiology/STEMI.      Signed, Sherren Mocha, MD  07/18/2017, 10:17 AM

## 2017-07-18 NOTE — Progress Notes (Signed)
  Echocardiogram 2D Echocardiogram has been performed.  [name] 07/18/2017, 11:03 AM

## 2017-07-18 NOTE — Discharge Summary (Signed)
Discharge Summary    Patient ID: [name],  MRN: [id number], DOB/AGE: [birth year] 82 y.o.  Admit date: 07/17/2017 Discharge date: 07/18/2017  Primary Care Provider: Leighton [name] Primary Cardiologist: [name]: [name]   Discharge Diagnoses    Principal Problem:   S/P TAVR (transcatheter aortic valve replacement) Active Problems:   Essential hypertension   Insulin dependent diabetes mellitus ([place])   Aortic stenosis   Mitral stenosis   Severe aortic stenosis   Allergies Allergies  Allergen Reactions  . Tape Rash    CLEAR PLASTIC TAPE    Diagnostic Studies/Procedures   TAVR OPERATIVE NOTE  Date of Procedure:                07/17/2017  Preoperative Diagnosis:      Severe Aortic Stenosis   Postoperative Diagnosis:    Same   Procedure:        Transcatheter Aortic Valve Replacement - Percutaneous Percutaneous Right Transfemoral Approach             Edwards Sapien 3 THV (size 26 mm, model # 9600TFX, serial # [id number])  Echo 07/18/17 Study Conclusions  - Left ventricle: The cavity size was normal. There was mild   concentric hypertrophy. Systolic function was vigorous. The   estimated ejection fraction was in the range of 65% to 70%. Wall   motion was normal; there were no regional wall motion   abnormalities. The study is not technically sufficient to allow   evaluation of LV diastolic function. - Aortic valve: s/p 94mm Edwards Sapien TAVR well seated with   normal function and no perivalvular AI. Transvalvular velocity   was within the normal range. There was no stenosis. Mean gradient   (S): 15 mm Hg. Valve area (VTI): 2.1 cm^2. Valve area (Vmax): 2   cm^2. Valve area (Vmean): 2.12 cm^2. - Mitral valve: Severe diffuse thickening and calcification of the   posterior leaflet. Mobility of the posterior leaflet was   restricted to the point of immobility. The findings are   consistent with mild to moderate stenosis. There was mild  regurgitation. Mean gradient (D): 7 mm Hg. Valve area by pressure   half-time: 2.12 cm^2. Valve area by continuity equation (using   LVOT flow): 1.64 cm^2. - Left atrium: The atrium was mildly dilated. - Pulmonary arteries: PA peak pressure: 37 mm Hg (S).  Impressions:  - Compared to prior echo a TAVR is now present. The right   ventricular systolic pressure was increased consistent with mild   pulmonary hypertension.   History of Present Illness     [name] is a 82 y.o. female with hypertension, diabetes, prior DVT/PE, severe aortic stenosis, complete heart block status post pacemaker, breast cancer status post left lumpectomy, and morbid obesity who presents for TVAR.   [name] was admitted to the hospital 04/2016 with symptomatic bradycardia. She was noted to be in complete heart block with a ventricular rate in the 30s. [name] implanted a [employer] dual-chamber pacemaker on 04/21/16.  She had an echocardiogram 04/21/16 that revealed LVEF 60-65% with grade 1 diastolic dysfunction. She also had moderate aortic stenosis with a mean gradient of 33 mmHg. She was noted to have moderate mitral stenosis due to mitral annular calcification as well.  Repeat echocardiogram 05/2017 that revealed LVEF 55-60% and grade 1 diastolic dysfunction.  She was found to have critical aortic stenosis with a mean gradient of 62 mmHg.  She was evaluated by valve  team and underwent R & L cath 06/13/17 which showed widely patent coronaries. Severely calcified aortic valve with restricted motion, known severe AS by echo. Severely calcified mitral valve annulus.   Hospital Course     Consultants: None  She underwent successful Transcatheter Aortic Valve Replacement via  Percutaneous Percutaneous Right Transfemoral Approach with Edwards Sapien 3 THV (size 26 mm, model # 9600TFX, serial # [id number]). No complications. Echo showed normal valve function without perivalvular leak. Patient had dyspnea at 50 feet of  walking, elevated BNP. CXR with mild interstitial edema and pulmonary vascular prominence. Symptoms resolved with single dose IV lasix. Restart home dose of 60 mg daily. She will continue home losartan (recently changed from Olmesartan due to non availability). Continue ASA and  Plavix. No change in home medications.   The patient has been seen by [name]  today and deemed ready for discharge home. All follow-up appointments have been scheduled. Discharge medications are listed below.  Discharge Vitals Blood pressure (!) 131/44, pulse 68, temperature 98 F (36.7 C), temperature source Oral, resp. rate 19, height 5\' 7"  (1.702 m), weight 273 lb 13 oz (124.2 kg), SpO2 95 %.  Filed Weights   07/17/17 0838 07/18/17 1159  Weight: 272 lb (123.4 kg) 273 lb 13 oz (124.2 kg)    Labs & Radiologic Studies     CBC Recent Labs    07/17/17 1224 07/18/17 0420  WBC  --  7.8  HGB 10.2* 10.6*  HCT 30.0* 33.0*  MCV  --  92.2  PLT  --  295*   Basic Metabolic Panel Recent Labs    07/17/17 1156 07/17/17 1224 07/18/17 0420  NA 142 143 139  K 4.2 4.1 4.3  CL 104  --  106  CO2  --   --  26  GLUCOSE 169* 161* 153*  BUN 19  --  16  CREATININE 0.90  --  0.95  CALCIUM  --   --  8.7*  MG  --   --  1.7     Dg Chest 2 View  Result Date: 07/13/2017 CLINICAL DATA:  Preoperative exam for TAVR.  Aortic stenosis. EXAM: CHEST - 2 VIEW COMPARISON:  04/22/2016 FINDINGS: Cardiomegaly. Atherosclerosis and tortuosity of the aorta. Dual lead pacemaker with leads in the region of the right atrium and right ventricle. Pulmonary vascularity is normal. Lungs are clear. No edema or effusions. Ordinary degenerative changes affect the spine and shoulders. IMPRESSION: No active disease. Cardiomegaly. Aortic atherosclerosis. Pacemaker. Electronically Signed   By: [name] M.D.   On: 07/13/2017 15:18   Ct Coronary Morph W/cta Cor W/score W/ca W/cm &/or Wo/cm  Addendum Date: 06/21/2017   ADDENDUM REPORT: 06/21/2017  23:12 CLINICAL DATA:  22 -year-old female with severe aortic stenosis being evaluated for a TAVR procedure. EXAM: Cardiac TAVR CT TECHNIQUE: The patient was scanned on a [employer]. A 120 kV retrospective scan was triggered in the descending thoracic aorta at 111 HU's. Gantry rotation speed was 250 msecs and collimation was .6 mm. No beta blockade or nitro were given. The 3D data set was reconstructed in 5% intervals of the R-R cycle. Systolic and diastolic phases were analyzed on a dedicated work station using MPR, MIP and VRT modes. The patient received 80 cc of contrast. FINDINGS: Aortic Valve: Trileaflet aortic valve with severe calcifications asymmetrically predominantly located in the non-coronary cusp with minimal extension into the LVOT. Aorta: Normal size, no dissection, minimal calcifications. Sinotubular Junction: 28 x 27 mm Ascending  Thoracic Aorta: 34 x 32 mm Aortic Arch: 29 x 26 mm Descending Thoracic Aorta: 25 x 24 mm Sinus of Valsalva Measurements: Non-coronary: 33 mm Right -coronary: 30 mm Left -coronary: 31 mm Coronary Artery Height above Annulus: Left Main: 14 mm Right Coronary: 20 mm Virtual Basal Annulus Measurements: Maximum/Minimum Diameter: 27.4 x 21.2 mm Mean Diameter: 23.7 mm Perimeter: 76.5 mm Area: 440 mm2 Optimum Fluoroscopic Angle for Delivery:  RAO 5 CRA 4 IMPRESSION: 1. Trileaflet aortic valve with severe calcifications asymmetrically predominantly located in the non-coronary cusp with minimal extension into the LVOT. Annular measurements suitable for delivery of a 26 mm Edwards-SAPIEN 3 valve. 2. Sufficient coronary to annulus distance. 3. Optimum Fluoroscopic Angle for Delivery: RAO 5 CRA 4 4. No thrombus in the left atrial appendage. 5.  Dilated pulmonary artery measuring 34 mm. Electronically Signed   By: Ena Dawley   On: 06/21/2017 23:12   Result Date: 06/21/2017 EXAM: OVER-READ INTERPRETATION  CT CHEST The following report is an over-read performed by  radiologist Dr. Vinnie Langton of [facility], [place] on 06/21/2017. This over-read does not include interpretation of cardiac or coronary anatomy or pathology. The coronary calcium score/coronary CTA interpretation by the cardiologist is attached. COMPARISON:  Chest CT 04/12/2016. FINDINGS: Extracardiac findings will be described separately under dictation for contemporaneously obtained CTA chest, abdomen and pelvis. IMPRESSION: Please see separate dictation for contemporaneously obtained CTA chest, abdomen and pelvis for full description of relevant extracardiac findings. Electronically Signed: By: Vinnie Langton M.D. On: 06/21/2017 08:33   Dg Chest Port 1 View  Result Date: 07/17/2017 CLINICAL DATA:  Status post transcatheter aortic valve replacement today. EXAM: PORTABLE CHEST 1 VIEW COMPARISON:  PA and lateral chest x-ray of Jul 13, 2017 FINDINGS: The lungs are well-expanded. The interstitial markings are mildly prominent. The cardiac silhouette remains enlarged. The pulmonary vascularity is mildly engorged. The ICD is in stable position. The right internal jugular venous catheter terminates over the midportion of the SVC. There is calcification in the wall of the aortic arch and descending thoracic aorta. IMPRESSION: No postprocedure complication following transcatheter aortic valve replacement. Low-grade CHF not greatly changed from the previous study. Thoracic aortic atherosclerosis. Electronically Signed   By: David  Martinique M.D.   On: 07/17/2017 13:20   Ct Angio Chest Aorta W &/or Wo Contrast  Result Date: 06/21/2017 CLINICAL DATA:  82 year old female with history of severe aortic stenosis. Preprocedural study prior to potential transcatheter aortic valve replacement (TAVR) procedure. EXAM: CT ANGIOGRAPHY CHEST, ABDOMEN AND PELVIS TECHNIQUE: Multidetector CT imaging through the chest, abdomen and pelvis was performed using the standard protocol during bolus administration of intravenous  contrast. Multiplanar reconstructed images and MIPs were obtained and reviewed to evaluate the vascular anatomy. CONTRAST:  156mL ISOVUE-370 IOPAMIDOL (ISOVUE-370) INJECTION 76% COMPARISON:  Chest CT 04/12/2016. CT the abdomen and pelvis 12/28/2007. FINDINGS: CTA CHEST FINDINGS Cardiovascular: Heart size is normal. There is no significant pericardial fluid, thickening or pericardial calcification. Aortic atherosclerosis. No definite coronary artery calcifications. Severe thickening calcification of the aortic valve. Severe calcifications of the mitral annulus. Left sided pacemaker device in place with lead tips terminating in right atrial appendage and right ventricular apex. Mediastinum/Lymph Nodes: No pathologically enlarged mediastinal or hilar lymph nodes. Esophagus is unremarkable in appearance. No axillary lymphadenopathy. Lungs/Pleura: No suspicious pulmonary nodules or masses. No acute consolidative airspace disease. No pleural effusions. Musculoskeletal/Soft Tissues: There are no aggressive appearing lytic or blastic lesions noted in the visualized portions of the skeleton. CTA ABDOMEN AND PELVIS  FINDINGS Hepatobiliary: No suspicious cystic or solid hepatic lesions. No intra or extrahepatic biliary ductal dilatation. 1.6 cm calcified gallstone lying dependently in the gallbladder. No findings to suggest an acute cholecystitis at this time. Pancreas: No pancreatic mass. No pancreatic ductal dilatation. No pancreatic or peripancreatic fluid or inflammatory changes. Spleen: Unremarkable. Adrenals/Urinary Tract: Multiple large calculi are noted within the collecting systems of both kidneys, largest of which is in the upper pole of the right kidney measuring 1.9 x 1.7 x 2.0 cm. No additional calculi are noted along the course of either ureter or within the lumen of the urinary bladder. Multiple low-attenuation lesions in the left kidney, compatible with simple cysts, largest of which measures 8.4 cm in the  interpolar region. Mild multifocal cortical thinning in the right kidney. No suspicious renal lesions. No hydroureteronephrosis. Urinary bladder is normal in appearance. Stomach/Bowel: Normal appearance of the stomach. No pathologic dilatation of small bowel or colon. Numerous colonic diverticulae are noted, without surrounding inflammatory changes to suggest an acute diverticulitis at this time. Normal appendix. Vascular/Lymphatic: Aortic atherosclerosis, without evidence of aneurysm or dissection in the abdominal or pelvic vasculature. Celiac axis, superior mesenteric artery and inferior mesenteric artery are all widely patent without hemodynamically significant stenosis. Single renal arteries bilaterally are both widely patent without hemodynamically significant stenosis. Multiple prominent borderline enlarged and mildly enlarged pelvic and retroperitoneal lymph nodes are noted, measuring up to 1.4 cm in the left common iliac distribution (axial image 148 of series 14 and 1.3 cm in short axis in the aortocaval nodal station (axial image 126 of series 14). Reproductive: Status post hysterectomy. Ovaries are not confidently identified may be surgically absent or atrophic. Other: No significant volume of ascites.  No pneumoperitoneum. Musculoskeletal: There are no aggressive appearing lytic or blastic lesions noted in the visualized portions of the skeleton. VASCULAR MEASUREMENTS PERTINENT TO TAVR: AORTA: Minimal Aortic Diameter-18 x 18 mm Severity of Aortic Calcification-mild RIGHT PELVIS: Right Common Iliac Artery - Minimal Diameter-10.7 x 10.4 mm Tortuosity-mild-to-moderate Calcification-mild Right External Iliac Artery - Minimal Diameter-9.8 x 8.1 mm Tortuosity-moderate Calcification-none Right Common Femoral Artery - Minimal Diameter-8.1 x 8.3 mm Tortuosity-mild Calcification-mild LEFT PELVIS: Left Common Iliac Artery - Minimal Diameter-10.8 x 11.3 mm Tortuosity-moderate to severe Calcification-mild Left  External Iliac Artery - Minimal Diameter-8.6 x 8.4 mm Tortuosity-moderate Calcification-none Left Common Femoral Artery - Minimal Diameter-8.5 x 9.0 mm Tortuosity-mild Calcification-none Review of the MIP images confirms the above findings. IMPRESSION: 1. Vascular findings and measurements pertinent to potential TAVR procedure, as detailed above. 2. Severe thickening calcification of the aortic valve, compatible with the reported clinical history of severe aortic stenosis. 3. There is also severe thickening calcification of the mitral valve and annulus. 4. Several prominent borderline enlarged and mildly enlarged pelvic and retroperitoneal lymph nodes. These are of uncertain etiology and significance, but clinical correlation for any signs are symptoms of occult malignancy is suggested. Additionally, attention on follow-up imaging is recommended with repeat contrast enhanced CT the abdomen and pelvis in 3-6 months. 5. Aortic atherosclerosis. 6. Numerous large nonobstructive calculi are noted within the collecting systems of both kidneys, as above. 7. Cholelithiasis without evidence of acute cholecystitis at this time. 8. Mild colonic diverticulosis without evidence of acute diverticulitis at this time. 9. Additional incidental findings, as above. Aortic Atherosclerosis (ICD10-I70.0). Electronically Signed   By: Vinnie Langton M.D.   On: 06/21/2017 10:31   Ct Angio Abdomen Pelvis  W &/or Wo Contrast  Result Date: 06/21/2017 CLINICAL DATA:  82 year old female with  history of severe aortic stenosis. Preprocedural study prior to potential transcatheter aortic valve replacement (TAVR) procedure. EXAM: CT ANGIOGRAPHY CHEST, ABDOMEN AND PELVIS TECHNIQUE: Multidetector CT imaging through the chest, abdomen and pelvis was performed using the standard protocol during bolus administration of intravenous contrast. Multiplanar reconstructed images and MIPs were obtained and reviewed to evaluate the vascular anatomy.  CONTRAST:  172mL ISOVUE-370 IOPAMIDOL (ISOVUE-370) INJECTION 76% COMPARISON:  Chest CT 04/12/2016. CT the abdomen and pelvis 12/28/2007. FINDINGS: CTA CHEST FINDINGS Cardiovascular: Heart size is normal. There is no significant pericardial fluid, thickening or pericardial calcification. Aortic atherosclerosis. No definite coronary artery calcifications. Severe thickening calcification of the aortic valve. Severe calcifications of the mitral annulus. Left sided pacemaker device in place with lead tips terminating in right atrial appendage and right ventricular apex. Mediastinum/Lymph Nodes: No pathologically enlarged mediastinal or hilar lymph nodes. Esophagus is unremarkable in appearance. No axillary lymphadenopathy. Lungs/Pleura: No suspicious pulmonary nodules or masses. No acute consolidative airspace disease. No pleural effusions. Musculoskeletal/Soft Tissues: There are no aggressive appearing lytic or blastic lesions noted in the visualized portions of the skeleton. CTA ABDOMEN AND PELVIS FINDINGS Hepatobiliary: No suspicious cystic or solid hepatic lesions. No intra or extrahepatic biliary ductal dilatation. 1.6 cm calcified gallstone lying dependently in the gallbladder. No findings to suggest an acute cholecystitis at this time. Pancreas: No pancreatic mass. No pancreatic ductal dilatation. No pancreatic or peripancreatic fluid or inflammatory changes. Spleen: Unremarkable. Adrenals/Urinary Tract: Multiple large calculi are noted within the collecting systems of both kidneys, largest of which is in the upper pole of the right kidney measuring 1.9 x 1.7 x 2.0 cm. No additional calculi are noted along the course of either ureter or within the lumen of the urinary bladder. Multiple low-attenuation lesions in the left kidney, compatible with simple cysts, largest of which measures 8.4 cm in the interpolar region. Mild multifocal cortical thinning in the right kidney. No suspicious renal lesions. No  hydroureteronephrosis. Urinary bladder is normal in appearance. Stomach/Bowel: Normal appearance of the stomach. No pathologic dilatation of small bowel or colon. Numerous colonic diverticulae are noted, without surrounding inflammatory changes to suggest an acute diverticulitis at this time. Normal appendix. Vascular/Lymphatic: Aortic atherosclerosis, without evidence of aneurysm or dissection in the abdominal or pelvic vasculature. Celiac axis, superior mesenteric artery and inferior mesenteric artery are all widely patent without hemodynamically significant stenosis. Single renal arteries bilaterally are both widely patent without hemodynamically significant stenosis. Multiple prominent borderline enlarged and mildly enlarged pelvic and retroperitoneal lymph nodes are noted, measuring up to 1.4 cm in the left common iliac distribution (axial image 148 of series 14 and 1.3 cm in short axis in the aortocaval nodal station (axial image 126 of series 14). Reproductive: Status post hysterectomy. Ovaries are not confidently identified may be surgically absent or atrophic. Other: No significant volume of ascites.  No pneumoperitoneum. Musculoskeletal: There are no aggressive appearing lytic or blastic lesions noted in the visualized portions of the skeleton. VASCULAR MEASUREMENTS PERTINENT TO TAVR: AORTA: Minimal Aortic Diameter-18 x 18 mm Severity of Aortic Calcification-mild RIGHT PELVIS: Right Common Iliac Artery - Minimal Diameter-10.7 x 10.4 mm Tortuosity-mild-to-moderate Calcification-mild Right External Iliac Artery - Minimal Diameter-9.8 x 8.1 mm Tortuosity-moderate Calcification-none Right Common Femoral Artery - Minimal Diameter-8.1 x 8.3 mm Tortuosity-mild Calcification-mild LEFT PELVIS: Left Common Iliac Artery - Minimal Diameter-10.8 x 11.3 mm Tortuosity-moderate to severe Calcification-mild Left External Iliac Artery - Minimal Diameter-8.6 x 8.4 mm Tortuosity-moderate Calcification-none Left Common Femoral  Artery - Minimal Diameter-8.5 x 9.0 mm Tortuosity-mild  Calcification-none Review of the MIP images confirms the above findings. IMPRESSION: 1. Vascular findings and measurements pertinent to potential TAVR procedure, as detailed above. 2. Severe thickening calcification of the aortic valve, compatible with the reported clinical history of severe aortic stenosis. 3. There is also severe thickening calcification of the mitral valve and annulus. 4. Several prominent borderline enlarged and mildly enlarged pelvic and retroperitoneal lymph nodes. These are of uncertain etiology and significance, but clinical correlation for any signs are symptoms of occult malignancy is suggested. Additionally, attention on follow-up imaging is recommended with repeat contrast enhanced CT the abdomen and pelvis in 3-6 months. 5. Aortic atherosclerosis. 6. Numerous large nonobstructive calculi are noted within the collecting systems of both kidneys, as above. 7. Cholelithiasis without evidence of acute cholecystitis at this time. 8. Mild colonic diverticulosis without evidence of acute diverticulitis at this time. 9. Additional incidental findings, as above. Aortic Atherosclerosis (ICD10-I70.0). Electronically Signed   By: Vinnie Langton M.D.   On: 06/21/2017 10:31    Disposition   Pt is being discharged home today in good condition.  Follow-up Plans & Appointments    Follow-up Information    Eileen Stanford, PA-C. Go on 08/01/2017.   Specialties:  Cardiology, Radiology Why:  @2 :30pm for hospital follow up Contact information: [place] [address] [phone]          Discharge Instructions    Amb Referral to Cardiac Rehabilitation   Complete by:  As directed    Diagnosis:  Valve Replacement   Valve:  Aortic Comment - TAVR   Diet - low sodium heart healthy   Complete by:  As directed    Discharge instructions   Complete by:  As directed    ACTIVITY AND EXERCISE . Daily  activity and exercise are an important part of your recovery. People recover at different rates depending on their general health and type of valve procedure. . Most people require six to 10 weeks to feel recovered. . No lifting, pushing, pulling more than 10 pounds (examples to avoid: groceries, vacuuming, gardening, golfing):  - For one week with a procedure through the groin.  - For six weeks for procedures through the chest wall.  - For three months for procedures through the breast-bone. NOTE: You will typically see one of our providers 7-10 days after your procedure to discuss [place] the above activities.    DRIVING . Do not drive for four weeks after the date of your procedure. . If you have been told by your doctor in the past that you may not drive, you must talk with him/her before you begin driving again. . When you resume driving, you must have someone with you.   DRESSING . Groin site: you may leave the clear dressing over the site for up to one week or until it falls off.   HYGIENE . If you had a femoral (leg) procedure, you may take a shower when you return home. After the shower, pat the site dry. Do NOT use powder, oils or lotions in your groin area until the site has completely healed. . If you had a chest procedure, you may shower when you return home unless specifically instructed not to by your discharging practitioner.  - DO NOT scrub incision; pat dry with a towel  - DO NOT apply any lotions, oils, powders to the incision  - No tub baths / swimming for at least six weeks. . If you notice  any fevers, chills, increased pain, swelling, bleeding or pus, please contact your doctor.  ADDITIONAL INFORMATION . If you are going to have an upcoming dental procedure, please contact our office as you may require antibiotics ahead of time to prevent infection on your heart valve.   Increase activity slowly   Complete by:  As directed       Discharge Medications    Allergies as of 07/18/2017      Reactions   Tape Rash   CLEAR PLASTIC TAPE      Medication List    STOP taking these medications   olmesartan 20 MG tablet Commonly known as:  BENICAR     TAKE these medications   acetaminophen 650 MG CR tablet Commonly known as:  TYLENOL Take 1,300 mg by mouth every 8 (eight) hours as needed for pain.   aspirin 81 MG tablet Take 81 mg by mouth daily.   atorvastatin 20 MG tablet Commonly known as:  LIPITOR Take 20 mg by mouth at bedtime.   CALCIUM CITRATE + PO Take 1 tablet by mouth 2 (two) times daily.   cholecalciferol 1000 units tablet Commonly known as:  VITAMIN D Take 1,000 Units by mouth 2 (two) times daily.   clopidogrel 75 MG tablet Commonly known as:  PLAVIX Take 1 tablet (75 mg total) by mouth daily. Start taking on:  07/19/2017   fish oil-omega-3 fatty acids 1000 MG capsule Take 1 g by mouth every evening.   FLUoxetine 40 MG capsule Commonly known as:  PROZAC Take 40 mg by mouth daily.   furosemide 40 MG tablet Commonly known as:  LASIX Take 60 mg by mouth daily.   LEVEMIR FLEXTOUCH 100 UNIT/ML Pen Generic drug:  Insulin Detemir Inject 60-70 Units into the skin 2 (two) times daily. Per sliding scale   losartan 50 MG tablet Commonly known as:  COZAAR Take 50 mg by mouth daily.   Magnesium 400 MG Caps Take 400 mg by mouth at bedtime.   metFORMIN 1000 MG tablet Commonly known as:  GLUCOPHAGE Take 1,000 mg by mouth 2 (two) times daily with a meal.   metoprolol tartrate 25 MG tablet Commonly known as:  LOPRESSOR Take 12.5 mg by mouth 2 (two) times daily.   multivitamin tablet Take 1 tablet by mouth daily.         Outstanding Labs/Studies   None  Duration of Discharge Encounter   Greater than 30 minutes including physician time.  Signed, Crista Luria Chason Mciver PA-C 07/18/2017, 4:24 PM

## 2017-07-18 NOTE — Plan of Care (Signed)
Pt normally on Room Air w/ 02 sats in the mid 90s however pt placed on 2L Nasal Cannula because O2 sats drop when pt falls asleep. Pt HR/Rhythm/BP WDL. Pt stood for several minutes during linen change w/ support of device. Pt denies pain. Bed in lowest position and call light within reach.

## 2017-07-18 NOTE — Progress Notes (Addendum)
      [address]       [place] [address]             [phone]        CARDIOTHORACIC SURGERY PROGRESS NOTE   R1 Day Post-Op Procedure(s) (LRB): TRANSCATHETER AORTIC VALVE REPLACEMENT, TRANSFEMORAL (N/A) INTRAOPERATIVE TRANSTHORACIC ECHOCARDIOGRAM  Subjective: Looks good and feels well  Objective: Vital signs: BP Readings from Last 1 Encounters:  07/18/17 (!) 137/47   Pulse Readings from Last 1 Encounters:  07/18/17 80   Resp Readings from Last 1 Encounters:  07/18/17 20   Temp Readings from Last 1 Encounters:  07/18/17 99.8 F (37.7 C) (Oral)    Hemodynamics:    Physical Exam:  Rhythm:   sinus  Breath sounds: clear  Heart sounds:  RRR w/out murmur  Incisions:  Both groins okay  Abdomen:  Soft, non-distended, non-tender  Extremities:  Warm, well-perfused    Intake/Output from previous day: 05/14 0701 - 05/15 0700 In: 1680.7 [P.O.:510; I.V.:970.7; IV Piggyback:200] Out: 515 [Urine:500; Blood:15] Intake/Output this shift: No intake/output data recorded.  Lab Results:  CBC: Recent Labs    07/17/17 1224 07/18/17 0420  WBC  --  7.8  HGB 10.2* 10.6*  HCT 30.0* 33.0*  PLT  --  130*    BMET:  Recent Labs    07/17/17 1156 07/17/17 1224 07/18/17 0420  NA 142 143 139  K 4.2 4.1 4.3  CL 104  --  106  CO2  --   --  26  GLUCOSE 169* 161* 153*  BUN 19  --  16  CREATININE 0.90  --  0.95  CALCIUM  --   --  8.7*     PT/INR:  No results for input(s): LABPROT, INR in the last 72 hours.  CBG (last 3)  Recent Labs    07/17/17 1643 07/17/17 2222 07/18/17 0651  GLUCAP 128* 127* 145*    ABG    Component Value Date/Time   PHART 7.298 (L) 07/17/2017 1228   PCO2ART 54.8 (H) 07/17/2017 1228   PO2ART 201.0 (H) 07/17/2017 1228   HCO3 27.0 07/17/2017 1228   TCO2 29 07/17/2017 1228   ACIDBASEDEF 1.0 06/13/2017 0856   O2SAT 100.0 07/17/2017 1228    CXR: PORTABLE CHEST 1 VIEW  COMPARISON:  PA and lateral chest x-ray of Jul 13, 2017  FINDINGS: The lungs are well-expanded. The interstitial markings are mildly prominent. The cardiac silhouette remains enlarged. The pulmonary vascularity is mildly engorged. The ICD is in stable position. The right internal jugular venous catheter terminates over the midportion of the SVC. There is calcification in the wall of the aortic arch and descending thoracic aorta.  IMPRESSION: No postprocedure complication following transcatheter aortic valve replacement. Low-grade CHF not greatly changed from the previous study.  Thoracic aortic atherosclerosis.   Electronically Signed   By: David  Martinique M.D.   On: 07/17/2017 13:20   EKG: NSR w/out acute ischemic changes, partial RBBB - QRS duration essentially unchanged from preop   Assessment/Plan: S/P Procedure(s) (LRB): TRANSCATHETER AORTIC VALVE REPLACEMENT, TRANSFEMORAL (N/A) INTRAOPERATIVE TRANSTHORACIC ECHOCARDIOGRAM  Doing well POD1 TAVR Agree w/ plans for meds outlined by Dr Burt Knack Routine ECHO Transfer 4E Anticipate likely d/c home later today or tomorrow   Rexene Alberts, MD 07/18/2017 9:17 AM

## 2017-07-18 NOTE — Discharge Instructions (Signed)
ACTIVITY AND EXERCISE °• Daily activity and exercise are an important part of your recovery. People recover at different rates depending on their general health and type of valve procedure. °• Most people require six to 10 weeks to feel recovered. °• No lifting, pushing, pulling more than 10 pounds (examples to avoid: groceries, vacuuming, gardening, golfing): °            - For one week with a procedure through the groin. °            - For six weeks for procedures through the chest wall. °            - For three months for procedures through the breast-bone. °NOTE: You will typically see one of our providers 7-10 days after your procedure to discuss WHEN TO RESUME the above activities.  °  °  °DRIVING °• Do not drive for four weeks after the date of your procedure. °• If you have been told by your doctor in the past that you may not drive, you must talk with him/her before you begin driving again. °• When you resume driving, you must have someone with you. °  °  °DRESSING °• Groin site: you may leave the clear dressing over the site for up to one week or until it falls off. °  °  °HYGIENE °• If you had a femoral (leg) procedure, you may take a shower when you return home. After the shower, pat the site dry. Do NOT use powder, oils or lotions in your groin area until the site has completely healed. °• If you had a chest procedure, you may shower when you return home unless specifically instructed not to by your discharging practitioner. °            - DO NOT scrub incision; pat dry with a towel °            - DO NOT apply any lotions, oils, powders to the incision °            - No tub baths / swimming for at least six weeks. °• If you notice any fevers, chills, increased pain, swelling, bleeding or pus, please contact your doctor. °  °ADDITIONAL INFORMATION °• If you are going to have an upcoming dental procedure, please contact our office as you may require antibiotics ahead of time to prevent infection on your  heart valve.  °  °

## 2017-07-18 NOTE — Care Management Note (Signed)
Case Management Note Marvetta Gibbons RN,BSN [facility] 1-22 Case Manager  [phone]  Patient Details  Name: [name] MRN: [id number] Date of Birth: [birth year]  Subjective/Objective:  Pt admitted s/p TAVR                  Action/Plan: PTA pt lived at home with spouse- anticipate return home- CM to follow for transition of care needs  Expected Discharge Date:                  Expected Discharge Plan:  Home/Self Care  In-House Referral:     Discharge planning Services  CM Consult  Post Acute Care Choice:    Choice offered to:     DME Arranged:    DME Agency:     HH Arranged:    [place] Agency:     Status of Service:  In process, will continue to follow  If discussed at Long Length of Stay Meetings, dates discussed:    Discharge Disposition:   Additional Comments:  Dawayne Patricia, RN 07/18/2017, 11:05 AM

## 2017-07-18 NOTE — Progress Notes (Signed)
CARDIAC REHAB PHASE I   PRE:  Rate/Rhythm: 76 SR  BP:  Supine:   Sitting: 140/94  Standing:    SaO2: 94%RA  MODE:  Ambulation: 270 ft   POST:  Rate/Rhythm: 90 SR  BP:  Supine:   Sitting: 133/62  Standing:    SaO2: 97%RA 1250-1340 Pt assisted to bathroom and then walked 270 ft on RA with her rollator with steady gait. She stated she mainly walks in the house. To recliner after walk. Tolerated well. Discussed CRP 2 and pt will think about it as she has some limitations with her arthritis. Will refer to [place].   Graylon Good, RN BSN  07/18/2017 1:33 PM

## 2017-07-19 ENCOUNTER — Telehealth: Payer: Self-pay

## 2017-07-19 NOTE — Telephone Encounter (Signed)
Patient contacted regarding discharge from [facility] on 07/18/2017.  Patient understands to follow up with provider Angelena Form PA-C on 08/01/2017 at 2:30 PM at [facility] location. Patient understands discharge instructions? yes Patient understands medications and regiment? yes Patient understands to bring all medications to this visit? yes  The pt is doing well after TAVR and was excited to get a good night of rest. The pt has taken Benicar out of her medications at home. The pt is also aware of pending apts for Echo and OV on 6/19.

## 2017-07-23 ENCOUNTER — Ambulatory Visit (INDEPENDENT_AMBULATORY_CARE_PROVIDER_SITE_OTHER): Payer: Medicare Other | Admitting: *Deleted

## 2017-07-23 DIAGNOSIS — I442 Atrioventricular block, complete: Secondary | ICD-10-CM | POA: Diagnosis not present

## 2017-07-23 NOTE — Progress Notes (Signed)
Remote pacemaker transmission.   

## 2017-07-24 ENCOUNTER — Encounter: Payer: Self-pay | Admitting: Cardiology

## 2017-07-24 LAB — CUP PACEART REMOTE DEVICE CHECK
Battery Voltage: 3.01 V
Brady Statistic AP VP Percent: 6.9 %
Brady Statistic AS VP Percent: 23 %
Brady Statistic RA Percent Paced: 14 %
Brady Statistic RV Percent Paced: 29 %
Implantable Lead Implant Date: 20180216
Implantable Lead Location: 753860
Implantable Pulse Generator Implant Date: 20180216
Lead Channel Impedance Value: 450 Ohm
Lead Channel Pacing Threshold Amplitude: 0.75 V
Lead Channel Pacing Threshold Pulse Width: 0.5 ms
Lead Channel Setting Sensing Sensitivity: 2.5 mV
MDC IDC LEAD IMPLANT DT: 20180216
MDC IDC LEAD LOCATION: 753859
MDC IDC MSMT BATTERY REMAINING LONGEVITY: 126 mo
MDC IDC MSMT BATTERY REMAINING PERCENTAGE: 95.5 %
MDC IDC MSMT LEADCHNL RA IMPEDANCE VALUE: 410 Ohm
MDC IDC MSMT LEADCHNL RA SENSING INTR AMPL: 1.9 mV
MDC IDC MSMT LEADCHNL RV PACING THRESHOLD AMPLITUDE: 1 V
MDC IDC MSMT LEADCHNL RV PACING THRESHOLD PULSEWIDTH: 0.5 ms
MDC IDC MSMT LEADCHNL RV SENSING INTR AMPL: 5.7 mV
MDC IDC PG SERIAL: 7998131
MDC IDC SESS DTM: 20190520060014
MDC IDC SET LEADCHNL RA PACING AMPLITUDE: 2 V
MDC IDC SET LEADCHNL RV PACING AMPLITUDE: 1.25 V
MDC IDC SET LEADCHNL RV PACING PULSEWIDTH: 0.5 ms
MDC IDC STAT BRADY AP VS PERCENT: 7.8 %
MDC IDC STAT BRADY AS VS PERCENT: 62 %

## 2017-07-31 NOTE — Progress Notes (Signed)
[name]                                       Cardiology Office Note    Date:  08/01/2017   ID:  [name], DOB April 30, 1935, MRN [id number]  PCP:  Leighton Ruff, MD  Cardiologist: Dr. Oval Linsey / Dr. Burt Knack & Dr. Roxy Manns (TAVR) / Dr. Curt Bears (EP)  CC: [facility] s/p TAVR  History of Present Illness:  [name] is a 82 y.o. female with a history of CHB s/p PPM, HTN, prior DVT/PE, morbid obesity, chronic diastolic CHF, DMT2, ovarian/breast cancer, decreased physical mobility, and severe AS s/p TAVR (07/17/17) who presents to clinic for post hospital follow up.   The patient's cardiac history dates back to February 2018 when she was hospitalized with worsening shortness of breath and dizzy spells.  She was in complete heart block at the time and underwent permanent pacemaker placement.  Transthoracic echocardiogram performed at that time revealed the presence of normal left ventricular systolic function with moderate aortic stenosis.  Since then she has been followed regularly by Dr. Oval Linsey. Follow-up echocardiogram performed significant progression and severity of aortic stenosis. Peak velocity across aortic valve was measured 4.8 m/s corresponding mean transvalvular gradient estimated 62 mmHg.  The DVI was reported 0.21. Left ventricular systolic function remain normal with ejection fraction estimated 55 to 60%. She was also felt to have mild mitral stenosis and mild to moderate mitral regurgitation with mean transvalvular gradient across the mitral valve estimated 4 mmHg and mitral ERO measured 0.1 cm corresponding to a regurgitant volume of 21 mL. She was referred to the multidisciplinary heart valve clinic and underwent diagnostic cardiac catheterization by Dr. Burt Knack on June 13, 2017.  She was found to have widely patent coronary arteries with no significant coronary artery disease. There was mild pulmonary hypertension with preserved  cardiac output.  She underwent successful TAVR with a 26 mm Edwards Sapien THV via the TF approach on 07/17/2017.  Postoperative echo showed EF 65%, normally functioning TAVR valve with no PVL and mean gradient of 15 mmHg. She had postop volume overload and was diuresed with IV Lasix.  She was discharged on aspirin and Plavix.  Today she presents to clinic for follow-up. She does not feel a whole lot different but can tell a difference in her breathing when walking. She also feels less fatigued and can "think better." No CP or SOB. She has chronic LE edema from her bad knees. No orthopnea or PND. No dizziness or syncope. No blood in stool or urine. No palpitations. She has a lot of orthopedic issues which causes her a lot of pain.   Past Medical History:  Diagnosis Date  . Anxiety   . Aortic stenosis   . Arthritis   . Breast cancer [facility])    CANCER  . Diabetes mellitus    Type II  . Dyspnea   . Heart murmur   . History of chemotherapy   . History of kidney stones   . Hypertension   . Mitral stenosis   . Morbid obesity (HCC)    BMI > 40  . Ovarian cancer ([place])   . Presence of permanent cardiac pacemaker    St. Jude  . Pulmonary embolism ([place]) 2013   post op  . 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   26 mm [name]  Sapien 3 transcatheter heart valve placed via percutaneous right transfemoral approach   . Spinal stenosis     Past Surgical History:  Procedure Laterality Date  . ABDOMINAL HYSTERECTOMY  2013  . BREAST SURGERY  03-2004   lumpectomy   . CARDIAC CATHETERIZATION    . INSERT / REPLACE / REMOVE PACEMAKER    . INTRAOPERATIVE TRANSTHORACIC ECHOCARDIOGRAM  07/17/2017   Procedure: INTRAOPERATIVE TRANSTHORACIC ECHOCARDIOGRAM;  Surgeon: Sherren Mocha, MD;  Location: [place];  Service: Open Heart Surgery;;  . LITHOTRIPSY    . PACEMAKER IMPLANT N/A 04/21/2016   Procedure: Pacemaker Implant;  Surgeon: Will Meredith Leeds, MD;  Location: [place] CV LAB;   Service: Cardiovascular;  Laterality: N/A;  . TONSILLECTOMY    . TRANSCATHETER AORTIC VALVE REPLACEMENT, TRANSFEMORAL N/A 07/17/2017   Procedure: TRANSCATHETER AORTIC VALVE REPLACEMENT, TRANSFEMORAL;  Surgeon: Sherren Mocha, MD;  Location: [place];  Service: Open Heart Surgery;  Laterality: N/A;    Current Medications: Outpatient Medications Prior to Visit  Medication Sig Dispense Refill  . acetaminophen (TYLENOL) 650 MG CR tablet Take 1,300 mg by mouth every 8 (eight) hours as needed for pain.    [name] Kitchen aspirin 81 MG tablet Take 81 mg by mouth daily.      [name] Kitchen atorvastatin (LIPITOR) 20 MG tablet Take 20 mg by mouth at bedtime.     . Calcium Citrate-Vitamin D (CALCIUM CITRATE + PO) Take 1 tablet by mouth 2 (two) times daily.     . cholecalciferol (VITAMIN D) 1000 units tablet Take 1,000 Units by mouth 2 (two) times daily.     . clopidogrel (PLAVIX) 75 MG tablet Take 1 tablet (75 mg total) by mouth daily. 30 tablet 6  . fish oil-omega-3 fatty acids 1000 MG capsule Take 1 g by mouth every evening.     [name] Kitchen FLUoxetine (PROZAC) 40 MG capsule Take 40 mg by mouth daily.     . furosemide (LASIX) 40 MG tablet Take 60 mg by mouth daily.     [name] Kitchen LEVEMIR FLEXTOUCH 100 UNIT/ML Pen Inject 60-70 Units into the skin 2 (two) times daily. Per sliding scale    . losartan (COZAAR) 50 MG tablet Take 50 mg by mouth daily.    . Magnesium 400 MG CAPS Take 400 mg by mouth at bedtime.     . metFORMIN (GLUCOPHAGE) 1000 MG tablet Take 1,000 mg by mouth 2 (two) times daily with a meal.     . metoprolol tartrate (LOPRESSOR) 25 MG tablet Take 12.5 mg by mouth 2 (two) times daily.     . Multiple Vitamin (MULTIVITAMIN) tablet Take 1 tablet by mouth daily.       No facility-administered medications prior to visit.      Allergies:   Tape   Social History   Socioeconomic History  . Marital status: Married    Spouse name: Not on file  . Number of children: Not on file  . Years of education: Not on file  . Highest education  level: Not on file  Occupational History  . Not on file  Social Needs  . Financial resource strain: Not on file  . Food insecurity:    Worry: Not on file    Inability: Not on file  . Transportation needs:    Medical: Not on file    Non-medical: Not on file  Tobacco Use  . Smoking status: Former Smoker    Packs/day: 1.00    Years: 5.00    Pack years: 5.00    Types:  Cigarettes  . Smokeless tobacco: Never Used  Substance and Sexual Activity  . Alcohol use: No  . Drug use: No  . Sexual activity: Not on file  Lifestyle  . Physical activity:    Days per week: Not on file    Minutes per session: Not on file  . Stress: Not on file  Relationships  . Social connections:    Talks on phone: Not on file    Gets together: Not on file    Attends religious service: Not on file    Active member of club or organization: Not on file    Attends meetings of clubs or organizations: Not on file    Relationship status: Not on file  Other Topics Concern  . Not on file  Social History Narrative  . Not on file     Family History:  The patient's family history includes Cancer in her brother, father, and sister.      ROS:   Please see the history of present illness.    ROS All other systems reviewed and are negative.   PHYSICAL EXAM:   VS:  There were no vitals taken for this visit.   GEN: Well nourished, well developed, in no acute distress, obese HEENT: normal  Neck: no JVD, carotid bruits, or masses Cardiac: RRR. Soft SEM @ RUSB. No rubs, or gallops. 1 + bilateral LE edema  Respiratory:  clear to auscultation bilaterally, normal work of breathing GI: soft, nontender, nondistended, + BS MS: no deformity or atrophy  Skin: warm and dry, no rash. Neuro:  Alert and Oriented x 3, Strength and sensation are intact Psych: euthymic mood, full affect    Wt Readings from Last 3 Encounters:  07/18/17 273 lb 13 oz (124.2 kg)  07/04/17 274 lb (124.3 kg)  07/02/17 274 lb (124.3 kg)       Studies/Labs Reviewed:   EKG:  EKG Is NOT ordered today.    Recent Labs: 07/13/2017: ALT 18; B Natriuretic Peptide 312.1 07/18/2017: BUN 16; Creatinine, Ser 0.95; Hemoglobin 10.6; Magnesium 1.7; Platelets 130; Potassium 4.3; Sodium 139   Lipid Panel No results found for: CHOL, TRIG, HDL, CHOLHDL, VLDL, LDLCALC, LDLDIRECT  Additional studies/ records that were reviewed today include:  TAVR OPERATIVE NOTE   Date of Procedure:                07/17/2017  Preoperative Diagnosis:      Severe Aortic Stenosis   Procedure:        Transcatheter Aortic Valve Replacement - Percutaneous Percutaneous Right Transfemoral Approach             Edwards Sapien 3 THV (size 26 mm, model # 9600TFX, serial # [id number])              Co-Surgeons:                        Valentina Gu. Roxy Manns, MD and Sherren Mocha, MD  Pre-operative Echo Findings: ? Severe aortic stenosis ? Normal left ventricular systolic function  Post-operative Echo Findings: ? Mild paravalvular leak ? Normal left ventricular systolic function  _____________________   Post operative echo 07/18/17  Study Conclusions - Left ventricle: The cavity size was normal. There was mild   concentric hypertrophy. Systolic function was vigorous. The   estimated ejection fraction was in the range of 65% to 70%. Wall   motion was normal; there were no regional wall motion   abnormalities. The study is not  technically sufficient to allow   evaluation of LV diastolic function. - Aortic valve: s/p 87mm Edwards Sapien TAVR well seated with   normal function and no perivalvular AI. Transvalvular velocity   was within the normal range. There was no stenosis. Mean gradient   (S): 15 mm Hg. Valve area (VTI): 2.1 cm^2. Valve area (Vmax): 2   cm^2. Valve area (Vmean): 2.12 cm^2. - Mitral valve: Severe diffuse thickening and calcification of the   posterior leaflet. Mobility of the posterior leaflet was   restricted to the point of immobility.  The findings are   consistent with mild to moderate stenosis. There was mild   regurgitation. Mean gradient (D): 7 mm Hg. Valve area by pressure   half-time: 2.12 cm^2. Valve area by continuity equation (using   LVOT flow): 1.64 cm^2. - Left atrium: The atrium was mildly dilated. - Pulmonary arteries: PA peak pressure: 37 mm Hg (S). Impressions: - Compared to prior echo a TAVR is now present. The right   ventricular systolic pressure was increased consistent with mild   pulmonary hypertension.   ASSESSMENT & PLAN:   Severe AS s/p TAVR: doing well. She can tell a difference in her breathing and mental clarity Groin sites are stable. SBE prophylaxis discussed. We will call in Amoxil. I will see her back in June for 1 month echo and follow up  HTN: BP well controlled today   CHB s/p PPM: followed by Dr. Curt Bears  Morbid obesity: encouraged diet and exercise (as much as she is capable of given advanced arthritis)   Enlarged lymph nodes: CT scanning showed "several prominent borderline enlarged and mildly enlarged pelvic and retroperitoneal lymph nodes." Will arrange for repeat CT in 3 months as recommended by radiology. I will set this up at 1 month apt   Medication Adjustments/Labs and Tests Ordered: Current medicines are reviewed at length with the patient today.  Concerns regarding medicines are outlined above.  Medication changes, Labs and Tests ordered today are listed in the Patient Instructions below. There are no Patient Instructions on file for this visit.   Signed, Angelena Form, PA-C  08/01/2017 2:51 PM    [facility] [place], [place], [place]  [address] Phone: [phone]; Fax: [phone]

## 2017-08-01 ENCOUNTER — Encounter (INDEPENDENT_AMBULATORY_CARE_PROVIDER_SITE_OTHER): Payer: Self-pay

## 2017-08-01 ENCOUNTER — Encounter: Payer: Self-pay | Admitting: Physician Assistant

## 2017-08-01 ENCOUNTER — Ambulatory Visit (INDEPENDENT_AMBULATORY_CARE_PROVIDER_SITE_OTHER): Payer: Medicare Other | Admitting: Physician Assistant

## 2017-08-01 VITALS — BP 122/48 | HR 80 | Ht 67.0 in | Wt 275.0 lb

## 2017-08-01 DIAGNOSIS — I10 Essential (primary) hypertension: Secondary | ICD-10-CM

## 2017-08-01 DIAGNOSIS — Z950 Presence of cardiac pacemaker: Secondary | ICD-10-CM | POA: Diagnosis not present

## 2017-08-01 DIAGNOSIS — Z952 Presence of prosthetic heart valve: Secondary | ICD-10-CM | POA: Diagnosis not present

## 2017-08-01 MED ORDER — AMOXICILLIN 500 MG PO TABS: ORAL_TABLET | ORAL | 6 refills | Status: DC

## 2017-08-01 NOTE — Patient Instructions (Signed)
Medication Instructions:  Your provider recommends that you continue on your current medications as directed. Please refer to the Current Medication list given to you today.    Your physician discussed the importance of taking an antibiotic prior to any dental, gastrointestinal, genitourinary procedures to prevent damage to the heart valves from infection. You have been called in AMOXIL 2 grams to take 1 hour prior to dental visits.   Labwork: None  Testing/Procedures: Please keep your appointment for your ECHOCARDIOGRAM 08/22/17. Please arrive by 1:30PM.  Follow-Up: You have an appointment with Nell Range, PA scheduled immediately after your echo on 6/19.  Any Other Special Instructions Will Be Listed Below (If Applicable).     If you need a refill on your cardiac medications before your next appointment, please call your pharmacy.

## 2017-08-06 ENCOUNTER — Telehealth (HOSPITAL_COMMUNITY): Payer: Self-pay

## 2017-08-06 NOTE — Telephone Encounter (Signed)
Patients insurance is active and benefits verified through Medicare A/B - No co-pay, deductible amount of $185.00/$185.00 has been met, no out of pocket, 20% co-insurance, and no pre-authorization is required. Passport/reference [phone]  Patients insurance is active and benefits verified through [place] - No co-pay, no deductible, no out of pocket, no co-insurance, and no pre-authorization is required. Passport/reference [phone]  Will contact patient to see if he is interested in the Cardiac Rehab Program. If interested, patient will need to complete follow up appt. Once completed, patient will be contacted for scheduling upon review by the RN Navigator.

## 2017-08-06 NOTE — Telephone Encounter (Signed)
Attempted to call patient to see if she is interested in the Cardiac Rehab Program - lm on vm °

## 2017-08-08 ENCOUNTER — Encounter: Payer: Self-pay | Admitting: Thoracic Surgery (Cardiothoracic Vascular Surgery)

## 2017-08-10 ENCOUNTER — Telehealth (HOSPITAL_COMMUNITY): Payer: Self-pay

## 2017-08-10 NOTE — Telephone Encounter (Signed)
Called patient to see if she is interested in the Cardiac Rehab Program - Patient stated she is interested although she is a little concerned about what she is capable of doing. Patient is currently using a walker. Scheduled orientation on 10/02/2017 at 8:30am. Patient will attend the 11:15am exc class. Mailed packet.

## 2017-08-21 NOTE — Progress Notes (Signed)
[name]                                       Cardiology Office Note    Date:  08/22/2017   ID:  [name], DOB 1935-08-28, MRN [id number]  PCP:  Emily Ruff, MD  Cardiologist:  Dr. Oval [name] / Dr. Burt [name] & Dr. Roxy [name] (TAVR) / Dr. Curt [name] (EP)  CC: 1 month s/p TAVR  History of Present Illness:  [name] is a 82 y.o. female with a history of CHB s/p PPM, HTN, prior DVT/PE, morbid obesity, chronic diastolic CHF, DMT2, ovarian/breast cancer, decreased physical mobility, and severe AS s/p TAVR (07/17/17) who presents to clinic for follow up.   The patient's cardiac history dates back to February 2018 when she was hospitalized with worsening shortness of breath and dizzy spells. She was in complete heart block at the time and underwent permanent pacemaker placement. Transthoracic echocardiogram performed at that time revealed the presence of normal left ventricular systolic function with moderate aortic stenosis. Since then she has been followed regularly by Dr. Oval [name]. Follow-up echocardiogram performed significant progression and severity of aortic stenosis.Peak velocity across aortic valve was measured 4.8 m/s corresponding mean transvalvular gradient estimated 62 mmHg. The DVI was reported 0.21.Left ventricular systolic function remain normal with ejection fraction estimated 55 to 60%. She was also felt to have mild mitral stenosis and mild to moderate mitral regurgitation with mean transvalvular gradient across the mitral valve estimated 4 mmHg and mitral EROmeasured 0.1 cm corresponding to a regurgitant volume of 21 mL. She was referred to the multidisciplinary heart valve clinic and underwent diagnostic cardiac catheterization by Dr. Burt [name] on June 13, 2017. She was found to have widely patent coronary arteries with no significant coronary artery disease. There was mild pulmonary hypertension with preserved cardiac  output.  She underwent successful TAVR with a 26 mm Edwards Sapien THV via the TF approach on 07/17/2017.  Postoperative echo showed EF 65%, normally functioning TAVR valve with no PVL and mean gradient of 15 mmHg. She had postop volume overload and was diuresed with IV Lasix.  She was discharged on aspirin and Plavix.  Today she presents to clinic for follow up. She continues to do well. She has taking her BP and recently noticed that her BP was higher 150/90. She has had a lot of stress at home with her husband's health. No CP or SOB. She has chronic LE edema that is unchanged. No orthopnea or PND. No dizziness or syncope. No blood in stool or urine. No palpitations.     Past Medical History:  Diagnosis Date  . Anxiety   . Aortic stenosis   . Arthritis   . Breast cancer [facility])    CANCER  . Diabetes mellitus    Type II  . Dyspnea   . Heart murmur   . History of chemotherapy   . History of kidney stones   . Hypertension   . Mitral stenosis   . Morbid obesity (HCC)    BMI > 40  . Ovarian cancer ([place])   . Presence of permanent cardiac pacemaker    St. Jude  . Pulmonary embolism ([place]) 2013   post op  . 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 Spinal  stenosis     Past Surgical History:  Procedure Laterality Date  . ABDOMINAL HYSTERECTOMY  2013  . BREAST SURGERY  03-2004   lumpectomy   . CARDIAC CATHETERIZATION    . INSERT / REPLACE / REMOVE PACEMAKER    . INTRAOPERATIVE TRANSTHORACIC ECHOCARDIOGRAM  07/17/2017   Procedure: INTRAOPERATIVE TRANSTHORACIC ECHOCARDIOGRAM;  Surgeon: Emily Mocha, MD;  Location: [place];  Service: Open Heart Surgery;;  . LITHOTRIPSY    . PACEMAKER IMPLANT N/A 04/21/2016   Procedure: Pacemaker Implant;  Surgeon: Emily Meredith Leeds, MD;  Location: [place] [facility];  Service: Cardiovascular;  Laterality: N/A;  . TONSILLECTOMY     . TRANSCATHETER AORTIC VALVE REPLACEMENT, TRANSFEMORAL N/A 07/17/2017   Procedure: TRANSCATHETER AORTIC VALVE REPLACEMENT, TRANSFEMORAL;  Surgeon: Emily Mocha, MD;  Location: [place];  Service: Open Heart Surgery;  Laterality: N/A;    Current Medications: Outpatient Medications Prior to Visit  Medication Sig Dispense Refill  . acetaminophen (TYLENOL) 650 MG CR tablet Take 1,300 mg by mouth every 8 (eight) hours as needed for pain.    [name] Kitchen amoxicillin (AMOXIL) 500 MG tablet Take 4 tablets (2,000 mg) one hour prior to dental visits. 8 tablet 6  . aspirin 81 MG tablet Take 81 mg by mouth daily.      [name] Kitchen atorvastatin (LIPITOR) 20 MG tablet Take 20 mg by mouth at bedtime.     . Calcium Citrate-Vitamin D (CALCIUM CITRATE + PO) Take 1 tablet by mouth 2 (two) times daily.     . cholecalciferol (VITAMIN D) 1000 units tablet Take 1,000 Units by mouth 2 (two) times daily.     . clopidogrel (PLAVIX) 75 MG tablet Take 1 tablet (75 mg total) by mouth daily. 30 tablet 6  . fish oil-omega-3 fatty acids 1000 MG capsule Take 1 g by mouth every evening.     [name] Kitchen FLUoxetine (PROZAC) 40 MG capsule Take 40 mg by mouth daily.     . furosemide (LASIX) 40 MG tablet Take 60 mg by mouth daily.     [name] Kitchen LEVEMIR FLEXTOUCH 100 UNIT/ML Pen Inject 60-70 Units into the skin 2 (two) times daily. Per sliding scale    . losartan (COZAAR) 50 MG tablet Take 50 mg by mouth daily.    . Magnesium 400 MG CAPS Take 400 mg by mouth at bedtime.     . metFORMIN (GLUCOPHAGE) 1000 MG tablet Take 1,000 mg by mouth 2 (two) times daily with a meal.     . metoprolol tartrate (LOPRESSOR) 25 MG tablet Take 12.5 mg by mouth 2 (two) times daily.     . Multiple Vitamin (MULTIVITAMIN) tablet Take 1 tablet by mouth daily.       No facility-administered medications prior to visit.      Allergies:   Tape   Social History   Socioeconomic History  . Marital status: Married    Spouse name: Not on file  . Number of children: Not on file  . Years of  education: Not on file  . Highest education level: Not on file  Occupational History  . Not on file  Social Needs  . Financial resource strain: Not on file  . Food insecurity:    Worry: Not on file    Inability: Not on file  . Transportation needs:    Medical: Not on file    Non-medical: Not on file  Tobacco Use  . Smoking status: Former Smoker    Packs/day: 1.00    Years: 5.00    Pack  years: 5.00    Types: Cigarettes  . Smokeless tobacco: Never Used  Substance and Sexual Activity  . Alcohol use: No  . Drug use: No  . Sexual activity: Not on file  Lifestyle  . Physical activity:    Days per week: Not on file    Minutes per session: Not on file  . Stress: Not on file  Relationships  . Social connections:    Talks on phone: Not on file    Gets together: Not on file    Attends religious service: Not on file    Active member of club or organization: Not on file    Attends meetings of clubs or organizations: Not on file    Relationship status: Not on file  Other Topics Concern  . Not on file  Social History Narrative  . Not on file     Family History:  The patient's family history includes Cancer in her brother, father, and sister.      ROS:   Please see the history of present illness.    ROS All other systems reviewed and are negative.   PHYSICAL EXAM:   VS:  BP 134/60   Pulse 73   Ht 5' 7.5" (1.715 m)   Wt 274 lb (124.3 kg)   SpO2 90%   BMI 42.28 kg/m    GEN: Well nourished, well developed, in no acute distress, obese HEENT: normal  Neck: no JVD, carotid bruits, or masses Cardiac: RRR; Soft SEM  RUSB. No rubs, or gallops. 1 + bilateral LE edema  Respiratory:  clear to auscultation bilaterally, normal work of breathing GI: soft, nontender, nondistended, + BS MS: no deformity or atrophy  Skin: warm and dry, no rash Neuro:  Alert and Oriented x 3, Strength and sensation are intact Psych: euthymic mood, full affect    Wt Readings from Last 3 Encounters:   08/22/17 274 lb (124.3 kg)  08/01/17 275 lb (124.7 kg)  07/18/17 273 lb 13 oz (124.2 kg)      Studies/Labs Reviewed:   EKG:  EKG is NOT ordered today.    Recent Labs: 07/13/2017: ALT 18; B Natriuretic Peptide 312.1 07/18/2017: BUN 16; Creatinine, Ser 0.95; Hemoglobin 10.6; Magnesium 1.7; Platelets 130; Potassium 4.3; Sodium 139   Lipid Panel No results found for: CHOL, TRIG, HDL, CHOLHDL, VLDL, LDLCALC, LDLDIRECT  Additional studies/ records that were reviewed today include:  TAVR OPERATIVE NOTE   Date of Procedure:07/17/2017  Preoperative Diagnosis:Severe Aortic Stenosis   Procedure:   Transcatheter Aortic Valve Replacement - PercutaneousPercutaneous RightTransfemoral Approach Edwards Sapien 3 THV (size 79mm, model # 9600TFX, serial # [id number])  Co-Surgeons:Clarence H. Emily Manns, MD and Emily Mocha, MD  Pre-operative Echo Findings: ? Severe aortic stenosis ? Normalleft ventricular systolic function  Post-operative Echo Findings: ? Mildparavalvular leak ? Normalleft ventricular systolic function  _____________________   Post operative echo 07/18/17  Study Conclusions - Left ventricle: The cavity size was normal. There was mild concentric hypertrophy. Systolic function was vigorous. The estimated ejection fraction was in the range of 65% to 70%. Wall motion was normal; there were no regional wall motion abnormalities. The study is not technically sufficient to allow evaluation of LV diastolic function. - Aortic valve: s/p 43mm Edwards Sapien TAVR well seated with normal function and no perivalvular AI. Transvalvular velocity was within the normal range. There was no stenosis. Mean gradient (S): 15 mm Hg. Valve area (VTI): 2.1 cm^2. Valve area (Vmax): 2 cm^2. Valve area (Vmean): 2.12 cm^2. - Mitral valve:  Severe diffuse thickening and calcification of the posterior  leaflet. Mobility of the posterior leaflet was restricted to the point of immobility. The findings are consistent with mild to moderate stenosis. There was mild regurgitation. Mean gradient (D): 7 mm Hg. Valve area by pressure half-time: 2.12 cm^2. Valve area by continuity equation (using LVOT flow): 1.64 cm^2. - Left atrium: The atrium was mildly dilated. - Pulmonary arteries: PA peak pressure: 37 mm Hg (S). Impressions: - Compared to prior echo a TAVR is now present. The right ventricular systolic pressure was increased consistent with mild pulmonary hypertension.  _____________________   2D ECHO 08/22/17 ( 30 day s/p TAVR) Study Conclusions - Catheterization with cardiac intervention. TAVR performed. 26 mm   Edwards Sapien - Left ventricle: The cavity size was normal. Wall thickness was   increased in a pattern of mild LVH. Systolic function was   vigorous. The estimated ejection fraction was in the range of 65%   to 70%. Wall motion was normal; there were no regional wall   motion abnormalities. Doppler parameters are consistent with   abnormal left ventricular relaxation (grade 1 diastolic   dysfunction). - Aortic valve: A TAVR bioprosthesis was present and functioning   normally. Peak velocity (S): 268 cm/s. Mean gradient (S): 16 mm   Hg. Peak gradient (S): 29 mm Hg. Valve area (VTI): 1.71 cm^2.   Valve area (Vmax): 1.51 cm^2. Valve area (Vmean): 1.59 cm^2. - Mitral valve: Severely calcified annulus. Mobility was   restricted. The findings are consistent with mild stenosis. Mean   gradient (D): 4 mm Hg. Valve area by pressure half-time: 1.88   cm^2. Valve area by continuity equation (using LVOT flow): 1.72   cm^2. - Left atrium: The atrium was mildly dilated. Volume/bsa, S: 39.9   ml/m^2. - Pulmonary arteries: Systolic pressure was mildly increased. PA   peak pressure: 31 mm Hg (S).  ASSESSMENT & PLAN:   Severe AS s/p TAVR: 2D ECHO today shows EF 65%,  normally functioning TAVR valve with mean gradient 16 mm Hg. She has NYHA class I symptoms. She can stop plavix after 6 months of therapy (Nov 2019). SBE prophylaxis discussed.   HTN: BP well controlled today   CHB s/p PPM: followed by Dr. Curt [name]  Morbid obesity: encouraged diet and exercise (as much as she is capable of given advanced arthritis)   Enlarged lymph nodes: CT scanning showed "several prominent borderline enlarged and mildly enlarged pelvic and retroperitoneal lymph nodes." Emily arrange for repeat CT in 3 months as recommended by radiology.    Medication Adjustments/Labs and Tests Ordered: Current medicines are reviewed at length with the patient today.  Concerns regarding medicines are outlined above.  Medication changes, Labs and Tests ordered today are listed in the Patient Instructions below. Patient Instructions  Medication Instructions:  Your physician recommends that you continue on your current medications as directed. Please refer to the Current Medication list given to you today.  You can stop Clopidogrel 6 months from date of your surgery--01/17/18  Labwork: Lab work to be done today--BMP  Testing/Procedures: Non-Cardiac CT scanning, (CAT scanning), is a noninvasive, special x-ray that produces cross-sectional images of the body using x-rays and a computer. CT scans help physicians diagnose and treat medical conditions. For some CT exams, a contrast material is used to enhance visibility in the area of the body being studied. CT scans provide greater clarity and reveal more details than regular x-ray exams.  To be done in one [facility] July 18  Follow-Up: Follow up with Dr [name] as planned.  You Emily have an echocardiogram and see K. Grandville Silos, PA in 11 months.  We Emily call you to schedule these appointments  Any Other Special Instructions Emily Be Listed Below (If Applicable).     If you need a refill on your cardiac medications before your next  appointment, please call your pharmacy.      Signed, Angelena Form, PA-C  08/22/2017 8:46 PM    [facility] [address], [place], [place]  [address] Phone: [phone]; Fax: [phone]

## 2017-08-22 ENCOUNTER — Other Ambulatory Visit: Payer: Self-pay

## 2017-08-22 ENCOUNTER — Encounter: Payer: Self-pay | Admitting: Physician Assistant

## 2017-08-22 ENCOUNTER — Ambulatory Visit (INDEPENDENT_AMBULATORY_CARE_PROVIDER_SITE_OTHER): Payer: Medicare Other | Admitting: Physician Assistant

## 2017-08-22 ENCOUNTER — Ambulatory Visit (HOSPITAL_COMMUNITY): Payer: Medicare Other | Attending: Cardiology

## 2017-08-22 VITALS — BP 134/60 | HR 73 | Ht 67.5 in | Wt 274.0 lb

## 2017-08-22 DIAGNOSIS — Z952 Presence of prosthetic heart valve: Secondary | ICD-10-CM

## 2017-08-22 DIAGNOSIS — I10 Essential (primary) hypertension: Secondary | ICD-10-CM

## 2017-08-22 DIAGNOSIS — I110 Hypertensive heart disease with heart failure: Secondary | ICD-10-CM | POA: Diagnosis not present

## 2017-08-22 DIAGNOSIS — I509 Heart failure, unspecified: Secondary | ICD-10-CM | POA: Insufficient documentation

## 2017-08-22 DIAGNOSIS — E119 Type 2 diabetes mellitus without complications: Secondary | ICD-10-CM | POA: Diagnosis not present

## 2017-08-22 DIAGNOSIS — R590 Localized enlarged lymph nodes: Secondary | ICD-10-CM | POA: Diagnosis not present

## 2017-08-22 DIAGNOSIS — Z6841 Body Mass Index (BMI) 40.0 and over, adult: Secondary | ICD-10-CM | POA: Insufficient documentation

## 2017-08-22 DIAGNOSIS — I350 Nonrheumatic aortic (valve) stenosis: Secondary | ICD-10-CM | POA: Diagnosis not present

## 2017-08-22 DIAGNOSIS — Z950 Presence of cardiac pacemaker: Secondary | ICD-10-CM

## 2017-08-22 DIAGNOSIS — Z48812 Encounter for surgical aftercare following surgery on the circulatory system: Secondary | ICD-10-CM | POA: Insufficient documentation

## 2017-08-22 NOTE — Patient Instructions (Signed)
Medication Instructions:  Your physician recommends that you continue on your current medications as directed. Please refer to the Current Medication list given to you today.  You can stop Clopidogrel 6 months from date of your surgery--01/17/18  Labwork: Lab work to be done today--BMP  Testing/Procedures: Non-Cardiac CT scanning, (CAT scanning), is a noninvasive, special x-ray that produces cross-sectional images of the body using x-rays and a computer. CT scans help physicians diagnose and treat medical conditions. For some CT exams, a contrast material is used to enhance visibility in the area of the body being studied. CT scans provide greater clarity and reveal more details than regular x-ray exams.  To be done in one month--around July 18  Follow-Up: Follow up with Dr Oval Linsey as planned.  You will have an echocardiogram and see K. Grandville Silos, PA in 11 months.  We will call you to schedule these appointments  Any Other Special Instructions Will Be Listed Below (If Applicable).     If you need a refill on your cardiac medications before your next appointment, please call your pharmacy.

## 2017-08-23 LAB — BASIC METABOLIC PANEL
BUN / CREAT RATIO: 23 (ref 12–28)
BUN: 24 mg/dL (ref 8–27)
CALCIUM: 10 mg/dL (ref 8.7–10.3)
CHLORIDE: 104 mmol/L (ref 96–106)
CO2: 26 mmol/L (ref 20–29)
CREATININE: 1.04 mg/dL — AB (ref 0.57–1.00)
GFR calc non Af Amer: 51 mL/min/{1.73_m2} — ABNORMAL LOW (ref >59)
GFR, EST AFRICAN AMERICAN: 59 mL/min/{1.73_m2} — AB (ref >59)
Glucose: 96 mg/dL (ref 65–99)
Potassium: 4.5 mmol/L (ref 3.5–5.2)
Sodium: 146 mmol/L — ABNORMAL HIGH (ref 134–144)

## 2017-09-18 ENCOUNTER — Ambulatory Visit: Payer: Medicare Other | Admitting: Cardiovascular Disease

## 2017-09-19 DIAGNOSIS — C569 Malignant neoplasm of unspecified ovary: Secondary | ICD-10-CM | POA: Diagnosis not present

## 2017-09-19 DIAGNOSIS — Z90710 Acquired absence of both cervix and uterus: Secondary | ICD-10-CM | POA: Diagnosis not present

## 2017-09-19 DIAGNOSIS — Z90722 Acquired absence of ovaries, bilateral: Secondary | ICD-10-CM | POA: Diagnosis not present

## 2017-09-19 DIAGNOSIS — Z9079 Acquired absence of other genital organ(s): Secondary | ICD-10-CM | POA: Diagnosis not present

## 2017-09-20 ENCOUNTER — Ambulatory Visit (INDEPENDENT_AMBULATORY_CARE_PROVIDER_SITE_OTHER)
Admission: RE | Admit: 2017-09-20 | Discharge: 2017-09-20 | Disposition: A | Discharge status: Home / Self Care | Payer: Medicare Other | Source: Ambulatory Visit | Attending: Physician Assistant | Admitting: Physician Assistant

## 2017-09-20 DIAGNOSIS — N200 Calculus of kidney: Secondary | ICD-10-CM | POA: Diagnosis not present

## 2017-09-20 DIAGNOSIS — R590 Localized enlarged lymph nodes: Secondary | ICD-10-CM | POA: Diagnosis not present

## 2017-09-20 MED ORDER — IOPAMIDOL (ISOVUE-300) INJECTION 61%
100.0000 mL | Freq: Once | INTRAVENOUS | Status: AC | PRN
  Administered 2017-09-20: 100 mL via INTRAVENOUS

## 2017-09-25 ENCOUNTER — Telehealth (HOSPITAL_COMMUNITY): Payer: Self-pay | Admitting: Pharmacist

## 2017-09-27 NOTE — Telephone Encounter (Signed)
Cardiac Rehab - Pharmacy Resident Documentation   Patient unable to be reached after three call attempts. Please complete allergy verification and medication review during patient's cardiac rehab appointment.    Gwenlyn Found, Sherian Rein D PGY1 Pharmacy Resident  Phone [phone] 09/27/2017   3:20 PM

## 2017-10-01 ENCOUNTER — Encounter (HOSPITAL_COMMUNITY): Payer: Self-pay

## 2017-10-02 ENCOUNTER — Encounter (HOSPITAL_COMMUNITY): Payer: Self-pay

## 2017-10-02 ENCOUNTER — Encounter (HOSPITAL_COMMUNITY)
Admission: RE | Admit: 2017-10-02 | Discharge: 2017-10-02 | Disposition: A | Discharge status: Home / Self Care | Payer: Medicare Other | Source: Ambulatory Visit | Attending: Cardiovascular Disease | Admitting: Cardiovascular Disease

## 2017-10-02 VITALS — BP 120/70 | HR 62 | Resp 16 | Ht 67.0 in | Wt 275.6 lb

## 2017-10-02 DIAGNOSIS — Z7984 Long term (current) use of oral hypoglycemic drugs: Secondary | ICD-10-CM | POA: Diagnosis not present

## 2017-10-02 DIAGNOSIS — Z79899 Other long term (current) drug therapy: Secondary | ICD-10-CM | POA: Insufficient documentation

## 2017-10-02 DIAGNOSIS — Z952 Presence of prosthetic heart valve: Secondary | ICD-10-CM | POA: Insufficient documentation

## 2017-10-02 DIAGNOSIS — Z8543 Personal history of malignant neoplasm of ovary: Secondary | ICD-10-CM | POA: Insufficient documentation

## 2017-10-02 DIAGNOSIS — Z853 Personal history of malignant neoplasm of breast: Secondary | ICD-10-CM | POA: Insufficient documentation

## 2017-10-02 DIAGNOSIS — E119 Type 2 diabetes mellitus without complications: Secondary | ICD-10-CM | POA: Diagnosis not present

## 2017-10-02 DIAGNOSIS — I10 Essential (primary) hypertension: Secondary | ICD-10-CM | POA: Insufficient documentation

## 2017-10-02 NOTE — Progress Notes (Signed)
Cardiac Rehab Medication Review by a Nurse  Does the patient  feel that his/her medications are working for him/her?  yes  Has the patient been experiencing any side effects to the medications prescribed?  no  Does the patient measure his/her own blood pressure or blood glucose at home?  yes   Does the patient have any problems obtaining medications due to transportation or finances?   no  Understanding of regimen: excellent Understanding of indications: excellent Potential of compliance: excellent    Nurse comments: [name] has a good understanding of her medications and when to take them. [name] did mention that she has to urinate a lot due to taking her diuretic and schedules her travel accordingly. [name] checks her blood pressure daily and checks her blood sugar with her home meter.Barnet Pall, RN,BSN 10/02/2017 12:40 PM     [name] 10/02/2017 12:34 PM

## 2017-10-02 NOTE — Progress Notes (Signed)
[name] 82 y.o. female DOB: Mar 20, 1935 MRN: [id number]      Nutrition Note  1. 07/17/2017 S/P TAVR (transcatheter aortic valve replacement)    Past Medical History:  Diagnosis Date  . Anxiety   . Aortic stenosis   . Arthritis   . Breast cancer [facility])    CANCER  . Diabetes mellitus    Type II  . Dyspnea   . Heart murmur   . History of chemotherapy   . History of kidney stones   . Hypertension   . Mitral stenosis   . Morbid obesity (HCC)    BMI > 40  . Ovarian cancer ([place])   . Presence of permanent cardiac pacemaker    St. Jude  . Pulmonary embolism ([place]) 2013   post op  . 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 Spinal stenosis    Meds reviewed. lipitor calcium, vit d, fish oil, lasix, magnesium, metformin, lopressor, MVI, levemir noted  HT: Ht Readings from Last 1 Encounters:  08/22/17 5' 7.5" (1.715 m)    WT: Wt Readings from Last 5 Encounters:  08/22/17 274 lb (124.3 kg)  08/01/17 275 lb (124.7 kg)  07/18/17 273 lb 13 oz (124.2 kg)  07/04/17 274 lb (124.3 kg)  07/02/17 274 lb (124.3 kg)     There is no height or weight on file to calculate BMI.   Current tobacco use? no       Labs:  Lipid Panel  No results found for: CHOL, TRIG, HDL, CHOLHDL, VLDL, LDLCALC, LDLDIRECT  Lab Results  Component Value Date   HGBA1C 6.6 (H) 07/13/2017   CBG (last 3)  No results for input(s): GLUCAP in the last 72 hours.  Nutrition Note Spoke with pt. Nutrition plan and goals reviewed with pt. Pt is following Step 2 of the Therapeutic Lifestyle Changes diet. Pt wants to lose wt. Pt has not actively been trying to lose wt. Wt loss tips reviewed. Pt is diabetic. Last A1c indicates blood glucose well-controlled. This Probation officer went over Diabetes Education test results. Pt checks CBG's 2-3 times a day. Fasting CBG's reportedly 140's-160's mg/dL. Per  discussion, pt does not use canned/convenience foods often. Pt rarely adds salt to food. Pt eats out infrequently. Pt expressed understanding of the information reviewed. Pt aware of nutrition education classes offered and plans on attending nutrition classes.  Nutrition Diagnosis ? Food-and nutrition-related knowledge deficit related to lack of exposure to information as related to diagnosis of: ? CVD ? DM  ? Obesity related to excessive energy intake as evidenced by a 42.28   Nutrition Intervention ? Pt's individual nutrition plan and goals reviewed with pt.  Nutrition Goal(s):   ? Pt to identify and limit food sources of saturated fat, trans fat, and sodium ? Pt to identify food quantities necessary to achieve weight loss of 6-24 lbs. at graduation from cardiac rehab.    Plan:  ? Pt to attend nutrition classes ? Nutrition I ? Nutrition II ? Portion Distortion  ? Diabetes Blitz ? Diabetes Q & Ae determined ? Will provide client-centered nutrition education as part of interdisciplinary care ? Monitor and evaluate progress toward nutrition goal with team.   [name], MS, RD, LDN 10/02/2017 12:10 PM

## 2017-10-02 NOTE — Progress Notes (Signed)
Cardiac Individual Treatment Plan  Patient Details  Name: [name] MRN: [id number] Date of Birth: 07/29/1935 Referring Provider:   Flowsheet Row CARDIAC REHAB PHASE II ORIENTATION from 10/02/2017 in [facility]  Referring Provider  Skeet Latch MD      Initial Encounter Date:  [place] from 10/02/2017 in [facility]  Date  10/02/17      Visit Diagnosis: 07/17/2017 S/P TAVR (transcatheter aortic valve replacement)  Patient's Home Medications on Admission:  Current Outpatient Medications:  .  acetaminophen (TYLENOL) 650 MG CR tablet, Take 1,300 mg by mouth every 8 (eight) hours as needed for pain., Disp: , Rfl:  .  amoxicillin (AMOXIL) 500 MG tablet, Take 4 tablets (2,000 mg) one hour prior to dental visits., Disp: 8 tablet, Rfl: 6 .  aspirin 81 MG tablet, Take 81 mg by mouth daily.  , Disp: , Rfl:  .  atorvastatin (LIPITOR) 20 MG tablet, Take 20 mg by mouth at bedtime. , Disp: , Rfl:  .  Calcium Citrate-Vitamin D (CALCIUM CITRATE + PO), Take 1 tablet by mouth 2 (two) times daily. , Disp: , Rfl:  .  cholecalciferol (VITAMIN D) 1000 units tablet, Take 1,000 Units by mouth 2 (two) times daily. , Disp: , Rfl:  .  clopidogrel (PLAVIX) 75 MG tablet, Take 1 tablet (75 mg total) by mouth daily., Disp: 30 tablet, Rfl: 6 .  fish oil-omega-3 fatty acids 1000 MG capsule, Take 1 g by mouth every evening. , Disp: , Rfl:  .  FLUoxetine (PROZAC) 40 MG capsule, Take 40 mg by mouth daily. , Disp: , Rfl:  .  furosemide (LASIX) 40 MG tablet, Take 60 mg by mouth daily. , Disp: , Rfl:  .  LEVEMIR FLEXTOUCH 100 UNIT/ML Pen, Inject 60-70 Units into the skin 2 (two) times daily. Per sliding scale, Disp: , Rfl:  .  losartan (COZAAR) 50 MG tablet, Take 50 mg by mouth daily., Disp: , Rfl:  .  Magnesium 400 MG CAPS, Take 400 mg by mouth at bedtime. , Disp: , Rfl:  .  metFORMIN (GLUCOPHAGE) 1000 MG  tablet, Take 1,000 mg by mouth 2 (two) times daily with a meal. , Disp: , Rfl:  .  metoprolol tartrate (LOPRESSOR) 25 MG tablet, Take 12.5 mg by mouth 2 (two) times daily. , Disp: , Rfl:  .  Multiple Vitamin (MULTIVITAMIN) tablet, Take 1 tablet by mouth daily.  , Disp: , Rfl:   Past Medical History: Past Medical History:  Diagnosis Date  . Anxiety   . Aortic stenosis   . Arthritis   . Breast cancer [facility])    CANCER  . Diabetes mellitus    Type II  . Dyspnea   . Heart murmur   . History of chemotherapy   . History of kidney stones   . Hypertension   . Mitral stenosis   . Morbid obesity (HCC)    BMI > 40  . Ovarian cancer ([place])   . Presence of permanent cardiac pacemaker    St. Jude  . Pulmonary embolism ([place]) 2013   post op  . 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 Spinal stenosis     Tobacco Use: Social History   Tobacco Use  Smoking Status Former Smoker  . Packs/day: 1.00  . Years: 5.00  . Pack years:  5.00  . Types: Cigarettes  Smokeless Tobacco Never Used    Labs: Recent Review Flowsheet Data    Labs for ITP Cardiac and Pulmonary Rehab Latest Ref Rng & Units 06/13/2017 07/13/2017 07/17/2017 07/17/2017 07/17/2017   Hemoglobin A1c 4.8 - 5.6 % - 6.6(H) - - -   PHART 7.350 - 7.450 - 7.444 - - 7.298(L)   PCO2ART 32.0 - 48.0 mmHg - 39.1 - - 54.8(H)   HCO3 20.0 - 28.0 mmol/L 25.7 26.3 - - 27.0   TCO2 22 - 32 mmol/L 27 - 26 26 29    ACIDBASEDEF 0.0 - 2.0 mmol/L - - - - -   O2SAT % 67.0 97.2 - - 100.0      Capillary Blood Glucose: Lab Results  Component Value Date   GLUCAP 137 (H) 07/18/2017   GLUCAP 150 (H) 07/18/2017   GLUCAP 145 (H) 07/18/2017   GLUCAP 127 (H) 07/17/2017   GLUCAP 128 (H) 07/17/2017     Exercise Target Goals: Date: 10/02/17  Exercise Program Goal: Individual exercise prescription set using results from initial 6  min walk test and THRR while considering  patient's activity barriers and safety.   Exercise Prescription Goal: Initial exercise prescription builds to 30-45 minutes a day of aerobic activity, 2-3 days per week.  Home exercise guidelines will be given to patient during program as part of exercise prescription that the participant will acknowledge.  Activity Barriers & Risk Stratification: Activity Barriers & Cardiac Risk Stratification - 10/02/17 0926    Activity Barriers & Cardiac Risk Stratification          Activity Barriers  Joint Problems;Arthritis;Back Problems;Deconditioning;Muscular Weakness;Other (comment);Balance Concerns;History of Falls;Assistive Device    Comments  spinal stenosis, hip and knee pain, B    Cardiac Risk Stratification  High           6 Minute Walk: 6 Minute Walk    6 Minute Walk    Row Name 10/02/17 1207   Phase  Initial   Distance  457 feet   Walk Time  4.58 minutes   # of Rest Breaks  3   MPH  1.1   METS  -0.13   RPE  15   VO2 Peak  -0.46   Symptoms  Yes (comment)   Comments  fatigue. 3 short rest breaks for a total of 62 seconds   Resting HR  62 bpm   Resting BP  120/70   Resting Oxygen Saturation   97 %   Exercise Oxygen Saturation  during 6 min walk  99 %   Max Ex. HR  84 bpm   Max Ex. BP  138/80   2 Minute Post BP  120/70          Oxygen Initial Assessment:   Oxygen Re-Evaluation:   Oxygen Discharge (Final Oxygen Re-Evaluation):   Initial Exercise Prescription: Initial Exercise Prescription - 10/02/17 1200    Date of Initial Exercise RX and Referring Provider          Date  10/02/17    Referring Provider  Skeet Latch MD        Recumbant Bike          Level  1    Minutes  10    METs  1        NuStep          Level  1    SPM  50    Minutes  10    METs  1  Arm Ergometer          Level  --    Minutes  --    METs  --        Track          Laps  4    Minutes  10    METs  1.7         Prescription Details          Frequency (times per week)  3    Duration  Progress to 30 minutes of continuous aerobic without signs/symptoms of physical distress        Intensity          THRR 40-80% of Max Heartrate  56-112    Ratings of Perceived Exertion  11-13    Perceived Dyspnea  0-4        Progression          Progression  Continue to progress workloads to maintain intensity without signs/symptoms of physical distress.        Resistance Training          Training Prescription  Yes    Weight  1lb    Reps  10-15           Perform Capillary Blood Glucose checks as needed.  Exercise Prescription Changes:   Exercise Comments:   Exercise Goals and Review: Exercise Goals    Exercise Goals    Row Name 10/02/17 0927   Increase Physical Activity  Yes   Intervention  Provide advice, education, support and counseling about physical activity/exercise needs.;Develop an individualized exercise prescription for aerobic and resistive training based on initial evaluation findings, risk stratification, comorbidities and participant's personal goals.   Expected Outcomes  Short Term: Attend rehab on a regular basis to increase amount of physical activity.;Long Term: Exercising regularly at least 3-5 days a week.;Long Term: Add in home exercise to make exercise part of routine and to increase amount of physical activity.   Increase Strength and Stamina  Yes improve functional mobility and be able to walk without pain   Intervention  Provide advice, education, support and counseling about physical activity/exercise needs.;Develop an individualized exercise prescription for aerobic and resistive training based on initial evaluation findings, risk stratification, comorbidities and participant's personal goals.   Expected Outcomes  Short Term: Increase workloads from initial exercise prescription for resistance, speed, and METs.;Short Term: Perform resistance training exercises routinely  during rehab and add in resistance training at home;Long Term: Improve cardiorespiratory fitness, muscular endurance and strength as measured by increased METs and functional capacity (6MWT)   Able to understand and use rate of perceived exertion (RPE) scale  Yes   Intervention  Provide education and explanation on how to use RPE scale   Expected Outcomes  Short Term: Able to use RPE daily in rehab to express subjective intensity level;Long Term:  Able to use RPE to guide intensity level when exercising independently   Knowledge and understanding of Target Heart Rate Range (THRR)  Yes   Intervention  Provide education and explanation of THRR including how the numbers were predicted and where they are located for reference   Expected Outcomes  Short Term: Able to state/look up THRR;Long Term: Able to use THRR to govern intensity when exercising independently;Short Term: Able to use daily as guideline for intensity in rehab   Able to check pulse independently  Yes   Intervention  Provide education and demonstration on how to check pulse in carotid and radial arteries.;Review  the importance of being able to check your own pulse for safety during independent exercise   Expected Outcomes  Short Term: Able to explain why pulse checking is important during independent exercise;Long Term: Able to check pulse independently and accurately   Understanding of Exercise Prescription  Yes   Intervention  Provide education, explanation, and written materials on patient's individual exercise prescription   Expected Outcomes  Short Term: Able to explain program exercise prescription;Long Term: Able to explain home exercise prescription to exercise independently          Exercise Goals Re-Evaluation :    Discharge Exercise Prescription (Final Exercise Prescription Changes):   Nutrition:  Target Goals: Understanding of nutrition guidelines, daily intake of sodium 1500mg , cholesterol 200mg , calories 30% from  fat and 7% or less from saturated fats, daily to have 5 or more servings of fruits and vegetables.  Biometrics: Pre Biometrics - 10/02/17 1216    Pre Biometrics          Height  5\' 7"  (1.702 m)    Weight  275 lb 9.2 oz (125 kg)    Waist Circumference  52 inches    Hip Circumference  56.5 inches    Waist to Hip Ratio  0.92 %    BMI (Calculated)  43.15    Triceps Skinfold  45 mm    % Body Fat  56.2 %    Grip Strength  30 kg    Flexibility  0 in LBP!    Single Leg Stand  0 seconds            Nutrition Therapy Plan and Nutrition Goals: Nutrition Therapy & Goals - 10/02/17 1214    Nutrition Therapy          Diet  consistent carbohydrate heart healthy        Personal Nutrition Goals          Nutrition Goal  Pt to identify and limit food sources of saturated fat, trans fat, and sodium    Personal Goal #2  Pt to identify food quantities necessary to achieve weight loss of 6-24 lbs. at graduation from cardiac rehab.         Intervention Plan          Intervention  Prescribe, educate and counsel regarding individualized specific dietary modifications aiming towards targeted core components such as weight, hypertension, lipid management, diabetes, heart failure and other comorbidities.    Expected Outcomes  Short Term Goal: Understand basic principles of dietary content, such as calories, fat, sodium, cholesterol and nutrients.           Nutrition Assessments: Nutrition Assessments - 10/02/17 1215    MEDFICTS Scores          Pre Score  35           Nutrition Goals Re-Evaluation:   Nutrition Goals Re-Evaluation:   Nutrition Goals Discharge (Final Nutrition Goals Re-Evaluation):   Psychosocial: Target Goals: Acknowledge presence or absence of significant depression and/or stress, maximize coping skills, provide positive support system. Participant is able to verbalize types and ability to use techniques and skills needed for reducing stress and  depression.  Initial Review & Psychosocial Screening:   Quality of Life Scores:  Scores of 19 and below usually indicate a poorer quality of life in these areas.  A difference of  2-3 points is a clinically meaningful difference.  A difference of 2-3 points in the total score of the Quality of Life Index has been associated  with significant improvement in overall quality of life, self-image, physical symptoms, and general health in studies assessing change in quality of life.  PHQ-9: Recent Review Flowsheet Data    There is no flowsheet data to display.     Interpretation of Total Score  Total Score Depression Severity:  1-4 = Minimal depression, 5-9 = Mild depression, 10-14 = Moderate depression, 15-19 = Moderately severe depression, 20-27 = Severe depression   Psychosocial Evaluation and Intervention:   Psychosocial Re-Evaluation:   Psychosocial Discharge (Final Psychosocial Re-Evaluation):   Vocational Rehabilitation: Provide vocational rehab assistance to qualifying candidates.   Vocational Rehab Evaluation & Intervention: Vocational Rehab - 10/02/17 1240    Initial Vocational Rehab Evaluation & Intervention          Assessment shows need for Vocational Rehabilitation  No retired           Education: Education Goals: Education classes will be provided on a weekly basis, covering required topics. Participant will state understanding/return demonstration of topics presented.  Learning Barriers/Preferences: Learning Barriers/Preferences - 10/02/17 0926    Learning Barriers/Preferences          Learning Barriers  Sight    Learning Preferences  Pictoral;Individual Instruction;Skilled Demonstration;Verbal Instruction;Video;Written Material;Audio           Education Topics: Count Your Pulse:  -Group instruction provided by verbal instruction, demonstration, patient participation and written materials to support subject.  Instructors address importance of being  able to find your pulse and how to count your pulse when at home without a heart monitor.  Patients get hands on experience counting their pulse with staff help and individually.   Heart Attack, Angina, and Risk Factor Modification:  -Group instruction provided by verbal instruction, video, and written materials to support subject.  Instructors address signs and symptoms of angina and heart attacks.    Also discuss risk factors for heart disease and how to make changes to improve heart health risk factors.   Functional Fitness:  -Group instruction provided by verbal instruction, demonstration, patient participation, and written materials to support subject.  Instructors address safety measures for doing things around the house.  Discuss how to get up and down off the floor, how to pick things up properly, how to safely get out of a chair without assistance, and balance training.   Meditation and Mindfulness:  -Group instruction provided by verbal instruction, patient participation, and written materials to support subject.  Instructor addresses importance of mindfulness and meditation practice to help reduce stress and improve awareness.  Instructor also leads participants through a meditation exercise.    Stretching for Flexibility and Mobility:  -Group instruction provided by verbal instruction, patient participation, and written materials to support subject.  Instructors lead participants through series of stretches that are designed to increase flexibility thus improving mobility.  These stretches are additional exercise for major muscle groups that are typically performed during regular warm up and cool down.   Hands Only CPR:  -Group verbal, video, and participation provides a basic overview of AHA guidelines for community CPR. Role-play of emergencies allow participants the opportunity to practice calling for help and chest compression technique with discussion of AED  use.   Hypertension: -Group verbal and written instruction that provides a basic overview of hypertension including the most recent diagnostic guidelines, risk factor reduction with self-care instructions and medication management.    Nutrition I class: Heart Healthy Eating:  -Group instruction provided by PowerPoint slides, verbal discussion, and written materials to support subject matter.  The instructor gives an explanation and review of the Therapeutic Lifestyle Changes diet recommendations, which includes a discussion on lipid goals, dietary fat, sodium, fiber, plant stanol/sterol esters, sugar, and the components of a well-balanced, healthy diet.   Nutrition II class: Lifestyle Skills:  -Group instruction provided by PowerPoint slides, verbal discussion, and written materials to support subject matter. The instructor gives an explanation and review of label reading, grocery shopping for heart health, heart healthy recipe modifications, and ways to make healthier choices when eating out.   Diabetes Question & Answer:  -Group instruction provided by PowerPoint slides, verbal discussion, and written materials to support subject matter. The instructor gives an explanation and review of diabetes co-morbidities, pre- and post-prandial blood glucose goals, pre-exercise blood glucose goals, signs, symptoms, and treatment of hypoglycemia and hyperglycemia, and foot care basics.   Diabetes Blitz:  -Group instruction provided by PowerPoint slides, verbal discussion, and written materials to support subject matter. The instructor gives an explanation and review of the physiology behind type 1 and type 2 diabetes, diabetes medications and rational behind using different medications, pre- and post-prandial blood glucose recommendations and Hemoglobin A1c goals, diabetes diet, and exercise including blood glucose guidelines for exercising safely.    Portion Distortion:  -Group instruction provided by  PowerPoint slides, verbal discussion, written materials, and food models to support subject matter. The instructor gives an explanation of serving size versus portion size, changes in portions sizes over the last 20 years, and what consists of a serving from each food group.   Stress Management:  -Group instruction provided by verbal instruction, video, and written materials to support subject matter.  Instructors review role of stress in heart disease and how to cope with stress positively.     Exercising on Your Own:  -Group instruction provided by verbal instruction, power point, and written materials to support subject.  Instructors discuss benefits of exercise, components of exercise, frequency and intensity of exercise, and end points for exercise.  Also discuss use of nitroglycerin and activating EMS.  Review options of places to exercise outside of rehab.  Review guidelines for sex with heart disease.   Cardiac Drugs I:  -Group instruction provided by verbal instruction and written materials to support subject.  Instructor reviews cardiac drug classes: antiplatelets, anticoagulants, beta blockers, and statins.  Instructor discusses reasons, side effects, and lifestyle considerations for each drug class.   Cardiac Drugs II:  -Group instruction provided by verbal instruction and written materials to support subject.  Instructor reviews cardiac drug classes: angiotensin converting enzyme inhibitors (ACE-I), angiotensin II receptor blockers (ARBs), nitrates, and calcium channel blockers.  Instructor discusses reasons, side effects, and lifestyle considerations for each drug class.   Anatomy and Physiology of the Circulatory System:  Group verbal and written instruction and models provide basic cardiac anatomy and physiology, with the coronary electrical and arterial systems. Review of: AMI, Angina, Valve disease, Heart Failure, Peripheral Artery Disease, Cardiac Arrhythmia, Pacemakers, and  the ICD.   Other Education:  -Group or individual verbal, written, or video instructions that support the educational goals of the cardiac rehab program.   Holiday Eating Survival Tips:  -Group instruction provided by PowerPoint slides, verbal discussion, and written materials to support subject matter. The instructor gives patients tips, tricks, and techniques to help them not only survive but enjoy the holidays despite the onslaught of food that accompanies the holidays.   Knowledge Questionnaire Score: Knowledge Questionnaire Score - 10/02/17 1239    Knowledge Questionnaire Score  Pre Score  -- pt did not return homework packet at orientation           Core Components/Risk Factors/Patient Goals at Admission: Personal Goals and Risk Factors at Admission - 10/02/17 1216    Core Components/Risk Factors/Patient Goals on Admission           Weight Management  Yes;Obesity;Weight Maintenance    Intervention  Weight Management: Develop a combined nutrition and exercise program designed to reach desired caloric intake, while maintaining appropriate intake of nutrient and fiber, sodium and fats, and appropriate energy expenditure required for the weight goal.;Weight Management: Provide education and appropriate resources to help participant work on and attain dietary goals.;Weight Management/Obesity: Establish reasonable short term and long term weight goals.;Obesity: Provide education and appropriate resources to help participant work on and attain dietary goals.    Admit Weight  275 lb 9.2 oz (125 kg)    Goal Weight: Short Term  270 lb (122.5 kg)    Goal Weight: Long Term  250 lb (113.4 kg)    Expected Outcomes  Short Term: Continue to assess and modify interventions until short term weight is achieved;Long Term: Adherence to nutrition and physical activity/exercise program aimed toward attainment of established weight goal;Weight Maintenance: Understanding of the daily nutrition  guidelines, which includes 25-35% calories from fat, 7% or less cal from saturated fats, less than 200mg  cholesterol, less than 1.5gm of sodium, & 5 or more servings of fruits and vegetables daily;Weight Loss: Understanding of general recommendations for a balanced deficit meal plan, which promotes 1-2 lb weight loss per week and includes a negative energy balance of [phone] kcal/d;Understanding recommendations for meals to include 15-35% energy as protein, 25-35% energy from fat, 35-60% energy from carbohydrates, less than 200mg  of dietary cholesterol, 20-35 gm of total fiber daily;Understanding of distribution of calorie intake throughout the day with the consumption of 4-5 meals/snacks    Diabetes  Yes    Intervention  Provide education about signs/symptoms and action to take for hypo/hyperglycemia.;Provide education about proper nutrition, including hydration, and aerobic/resistive exercise prescription along with prescribed medications to achieve blood glucose in normal ranges: Fasting glucose 65-99 mg/dL    Expected Outcomes  Short Term: Participant verbalizes understanding of the signs/symptoms and immediate care of hyper/hypoglycemia, proper foot care and importance of medication, aerobic/resistive exercise and nutrition plan for blood glucose control.;Long Term: Attainment of HbA1C < 7%.    Hypertension  Yes    Intervention  Provide education on lifestyle modifcations including regular physical activity/exercise, weight management, moderate sodium restriction and increased consumption of fresh fruit, vegetables, and low fat dairy, alcohol moderation, and smoking cessation.;Monitor prescription use compliance.    Expected Outcomes  Short Term: Continued assessment and intervention until BP is < 140/64mm HG in hypertensive participants. < 130/24mm HG in hypertensive participants with diabetes, heart failure or chronic kidney disease.;Long Term: Maintenance of blood pressure at goal levels.    Lipids   Yes    Intervention  Provide education and support for participant on nutrition & aerobic/resistive exercise along with prescribed medications to achieve LDL 70mg , HDL >40mg .    Expected Outcomes  Short Term: Participant states understanding of desired cholesterol values and is compliant with medications prescribed. Participant is following exercise prescription and nutrition guidelines.;Long Term: Cholesterol controlled with medications as prescribed, with individualized exercise RX and with personalized nutrition plan. Value goals: LDL < 70mg , HDL > 40 mg.           Core Components/Risk Factors/Patient Goals Review:  Core Components/Risk Factors/Patient Goals at Discharge (Final Review):    ITP Comments: ITP Comments    Row Name 10/01/17 1027 10/02/17 0925   ITP Comments  Dr. Fransico Him, Medical Director   Dr. Fransico Him, Medical Director       Comments:Patient attended orientation from [phone]  to 1001 to review rules and guidelines for program. Completed 6 minute walk test, Intitial ITP, and exercise prescription.  VSS. Telemetry-sinus rhythm, v paced .  Asymptomatic.  Andi Hence, RN, BSN Cardiac Pulmonary Rehab 10/02/17 12:43 PM

## 2017-10-08 ENCOUNTER — Encounter (HOSPITAL_COMMUNITY): Payer: Self-pay

## 2017-10-08 ENCOUNTER — Encounter (HOSPITAL_COMMUNITY): Payer: Medicare Other

## 2017-10-08 ENCOUNTER — Encounter (HOSPITAL_COMMUNITY)
Admission: RE | Admit: 2017-10-08 | Discharge: 2017-10-08 | Disposition: A | Discharge status: Home / Self Care | Payer: Medicare Other | Source: Ambulatory Visit | Attending: Cardiovascular Disease | Admitting: Cardiovascular Disease

## 2017-10-08 DIAGNOSIS — I10 Essential (primary) hypertension: Secondary | ICD-10-CM | POA: Insufficient documentation

## 2017-10-08 DIAGNOSIS — Z8543 Personal history of malignant neoplasm of ovary: Secondary | ICD-10-CM | POA: Insufficient documentation

## 2017-10-08 DIAGNOSIS — Z7984 Long term (current) use of oral hypoglycemic drugs: Secondary | ICD-10-CM | POA: Insufficient documentation

## 2017-10-08 DIAGNOSIS — Z952 Presence of prosthetic heart valve: Secondary | ICD-10-CM | POA: Insufficient documentation

## 2017-10-08 DIAGNOSIS — E119 Type 2 diabetes mellitus without complications: Secondary | ICD-10-CM | POA: Diagnosis not present

## 2017-10-08 DIAGNOSIS — Z853 Personal history of malignant neoplasm of breast: Secondary | ICD-10-CM | POA: Diagnosis not present

## 2017-10-08 DIAGNOSIS — Z79899 Other long term (current) drug therapy: Secondary | ICD-10-CM | POA: Diagnosis not present

## 2017-10-08 LAB — GLUCOSE, CAPILLARY
GLUCOSE-CAPILLARY: 140 mg/dL — AB (ref 70–99)
Glucose-Capillary: 117 mg/dL — ABNORMAL HIGH (ref 70–99)

## 2017-10-08 NOTE — Progress Notes (Signed)
Daily Session Note  Patient Details  Name: [name] MRN: [id number] Date of Birth: 1935/09/25 Referring Provider:     CARDIAC REHAB PHASE II ORIENTATION from 10/02/2017 in [place]  Referring Provider  Skeet Latch MD      Encounter Date: 10/08/2017  Check In: Session Check In - 10/08/17 1213      Check-In   Supervising physician immediately available to respond to emergencies  [facility] immediately available    Physician(s)  Dr. Thereasa Parkin     Location  [facility]    Staff Present  Jiles Garter, RN, Marga Melnick, RN, Mosie Epstein, MS,ACSM CEP, Exercise Physiologist;Joann Rion, RN, BSN    Medication changes reported      No    Fall or balance concerns reported     No    Tobacco Cessation  No Change    Warm-up and Cool-down  Performed as group-led instruction    Resistance Training Performed  Yes    VAD Patient?  No    PAD/SET Patient?  No      Pain Assessment   Currently in Pain?  No/denies       Capillary Blood Glucose: Results for orders placed or performed during the hospital encounter of 10/08/17 (from the past 24 hour(s))  Glucose, capillary     Status: Abnormal   Collection Time: 10/08/17 11:32 AM  Result Value Ref Range   Glucose-Capillary 140 (H) 70 - 99 mg/dL  Glucose, capillary     Status: Abnormal   Collection Time: 10/08/17 12:20 PM  Result Value Ref Range   Glucose-Capillary 117 (H) 70 - 99 mg/dL      Social History   Tobacco Use  Smoking Status Former Smoker  . Packs/day: 1.00  . Years: 5.00  . Pack years: 5.00  . Types: Cigarettes  Smokeless Tobacco Never Used    Goals Met:  Exercise tolerated well  Goals Unmet:  Not Applicable  Comments: Pt started cardiac rehab today.  Pt tolerated light exercise without difficulty. VSS, telemetry-Vpaced, asymptomatic.  Medication list reconciled. Pt denies barriers to medicaiton compliance.  PSYCHOSOCIAL ASSESSMENT:  PHQ-0. Pt  exhibits positive coping skills, hopeful outlook with supportive family. No psychosocial needs identified at this time, no psychosocial interventions necessary.  Pt oriented to exercise equipment and routine.    Understanding verbalized.    Dr. Fransico Him is Medical Director for Cardiac Rehab at [facility].

## 2017-10-10 ENCOUNTER — Encounter (HOSPITAL_COMMUNITY): Payer: Medicare Other

## 2017-10-10 ENCOUNTER — Encounter (HOSPITAL_COMMUNITY)
Admission: RE | Admit: 2017-10-10 | Discharge: 2017-10-10 | Disposition: A | Discharge status: Home / Self Care | Payer: Medicare Other | Source: Ambulatory Visit | Attending: Cardiovascular Disease | Admitting: Cardiovascular Disease

## 2017-10-10 VITALS — Ht 67.0 in | Wt 278.2 lb

## 2017-10-10 DIAGNOSIS — Z79899 Other long term (current) drug therapy: Secondary | ICD-10-CM | POA: Diagnosis not present

## 2017-10-10 DIAGNOSIS — I10 Essential (primary) hypertension: Secondary | ICD-10-CM | POA: Diagnosis not present

## 2017-10-10 DIAGNOSIS — E119 Type 2 diabetes mellitus without complications: Secondary | ICD-10-CM | POA: Diagnosis not present

## 2017-10-10 DIAGNOSIS — Z952 Presence of prosthetic heart valve: Secondary | ICD-10-CM

## 2017-10-10 DIAGNOSIS — Z7984 Long term (current) use of oral hypoglycemic drugs: Secondary | ICD-10-CM | POA: Diagnosis not present

## 2017-10-10 DIAGNOSIS — Z853 Personal history of malignant neoplasm of breast: Secondary | ICD-10-CM | POA: Diagnosis not present

## 2017-10-10 LAB — GLUCOSE, CAPILLARY: GLUCOSE-CAPILLARY: 112 mg/dL — AB (ref 70–99)

## 2017-10-10 NOTE — Progress Notes (Signed)
[name] 82 y.o. female Nutrition Note Spoke with pt. Nutrition plan and goals reviewed with pt. Pt is following Step 2 of the Therapeutic Lifestyle Changes diet. Pt wants to lose wt. Pt has not actively been trying to lose wt. Wt loss tips reviewed (eating frequently across the day, how to build a healthy plate, portion sizes). Pt is diabetic. Last A1c indicates blood glucose well-controlled. This Probation officer went over Diabetes Education test results. Pt checks CBG's 2-3 times a day. Fasting CBG's pt reported 140's-160's mg/dL. Per discussion, pt does not use canned/convenience foods often. Pt rarely adds salt to food. Pt eats out infrequently. Pt expressed understanding of the information reviewed. Pt aware of nutrition education classes offered and plans on attending nutrition classes.   Lab Results  Component Value Date   HGBA1C 6.6 (H) 07/13/2017    Wt Readings from Last 3 Encounters:  10/02/17 275 lb 9.2 oz (125 kg)  08/22/17 274 lb (124.3 kg)  08/01/17 275 lb (124.7 kg)    Nutrition Diagnosis ? Food-and nutrition-related knowledge deficit related to lack of exposure to information as related to diagnosis of: ? CVD ? DM  ? Obesity related to excessive energy intake as evidenced by a BMI of Body mass index is 43.58 kg/m.   Nutrition Intervention ? Pt's individual nutrition plan reviewed with pt. ? Benefits of adopting Heart Healthy diet discussed when Medficts reviewed.   ? Continue client-centered nutrition education by RD, as part of interdisciplinary care.  Goal(s)   ? Pt to identify and limit food sources of saturated fat, trans fat, and sodium ? Pt to identify food quantities necessary to achieve weight loss of 6-24 lb at graduation from cardiac rehab.   Plan:   Pt to attend nutrition classes ? Nutrition I ? Nutrition II ? Portion Distortion   Will provide client-centered nutrition education as part of interdisciplinary care  Monitor and evaluate progress toward  nutrition goal with team.    [name], MS, RD, LDN 10/10/2017 12:22 PM

## 2017-10-11 NOTE — Progress Notes (Signed)
Cardiac Individual Treatment Plan  Patient Details  Name: [name] MRN: [id number] Date of Birth: [birth year] Referring Provider:     [facility] from 10/02/2017 in [place]  Referring Provider  Skeet Latch MD      Initial Encounter Date:    CARDIAC REHAB PHASE II ORIENTATION from 10/02/2017 in [facility]  Date  10/02/17      Visit Diagnosis: 07/17/2017 S/P TAVR (transcatheter aortic valve replacement)  Patient's Home Medications on Admission:  Current Outpatient Medications:  .  acetaminophen (TYLENOL) 650 MG CR tablet, Take 1,300 mg by mouth every 8 (eight) hours as needed for pain., Disp: , Rfl:  .  amoxicillin (AMOXIL) 500 MG tablet, Take 4 tablets (2,000 mg) one hour prior to dental visits., Disp: 8 tablet, Rfl: 6 .  aspirin 81 MG tablet, Take 81 mg by mouth daily.  , Disp: , Rfl:  .  atorvastatin (LIPITOR) 20 MG tablet, Take 20 mg by mouth at bedtime. , Disp: , Rfl:  .  Calcium Citrate-Vitamin D (CALCIUM CITRATE + PO), Take 1 tablet by mouth 2 (two) times daily. , Disp: , Rfl:  .  cholecalciferol (VITAMIN D) 1000 units tablet, Take 1,000 Units by mouth 2 (two) times daily. , Disp: , Rfl:  .  clopidogrel (PLAVIX) 75 MG tablet, Take 1 tablet (75 mg total) by mouth daily., Disp: 30 tablet, Rfl: 6 .  fish oil-omega-3 fatty acids 1000 MG capsule, Take 1 g by mouth every evening. , Disp: , Rfl:  .  FLUoxetine (PROZAC) 40 MG capsule, Take 40 mg by mouth daily. , Disp: , Rfl:  .  furosemide (LASIX) 40 MG tablet, Take 60 mg by mouth daily. , Disp: , Rfl:  .  LEVEMIR FLEXTOUCH 100 UNIT/ML Pen, Inject 60-70 Units into the skin 2 (two) times daily. Per sliding scale, Disp: , Rfl:  .  losartan (COZAAR) 50 MG tablet, Take 50 mg by mouth daily., Disp: , Rfl:  .  Magnesium 400 MG CAPS, Take 400 mg by mouth at bedtime. , Disp: , Rfl:  .  metFORMIN (GLUCOPHAGE) 1000 MG tablet, Take 1,000 mg by mouth  2 (two) times daily with a meal. , Disp: , Rfl:  .  metoprolol tartrate (LOPRESSOR) 25 MG tablet, Take 12.5 mg by mouth 2 (two) times daily. , Disp: , Rfl:  .  Multiple Vitamin (MULTIVITAMIN) tablet, Take 1 tablet by mouth daily.  , Disp: , Rfl:   Past Medical History: Past Medical History:  Diagnosis Date  . Anxiety   . Aortic stenosis   . Arthritis   . Breast cancer [facility])    CANCER  . Diabetes mellitus    Type II  . Dyspnea   . Heart murmur   . History of chemotherapy   . History of kidney stones   . Hypertension   . Mitral stenosis   . Morbid obesity (HCC)    BMI > 40  . Ovarian cancer ([place])   . Presence of permanent cardiac pacemaker    St. Jude  . Pulmonary embolism ([facility]) 2013   post op  . 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 Spinal stenosis     Tobacco Use: Social History   Tobacco Use  Smoking Status Former Smoker  . Packs/day: 1.00  . Years: 5.00  . Pack years:  5.00  . Types: Cigarettes  Smokeless Tobacco Never Used    Labs: Recent Review Flowsheet Data    Labs for ITP Cardiac and Pulmonary Rehab Latest Ref Rng & Units 06/13/2017 07/13/2017 07/17/2017 07/17/2017 07/17/2017   Hemoglobin A1c 4.8 - 5.6 % - 6.6(H) - - -   PHART 7.350 - 7.450 - 7.444 - - 7.298(L)   PCO2ART 32.0 - 48.0 mmHg - 39.1 - - 54.8(H)   HCO3 20.0 - 28.0 mmol/L 25.7 26.3 - - 27.0   TCO2 22 - 32 mmol/L 27 - 26 26 29    ACIDBASEDEF 0.0 - 2.0 mmol/L - - - - -   O2SAT % 67.0 97.2 - - 100.0      Capillary Blood Glucose: Lab Results  Component Value Date   GLUCAP 112 (H) 10/10/2017   GLUCAP 117 (H) 10/08/2017   GLUCAP 140 (H) 10/08/2017   GLUCAP 137 (H) 07/18/2017   GLUCAP 150 (H) 07/18/2017     Exercise Target Goals:    Exercise Program Goal: Individual exercise prescription set using results from initial 6 min walk test and THRR while considering   patient's activity barriers and safety.   Exercise Prescription Goal: Initial exercise prescription builds to 30-45 minutes a day of aerobic activity, 2-3 days per week.  Home exercise guidelines will be given to patient during program as part of exercise prescription that the participant will acknowledge.  Activity Barriers & Risk Stratification: Activity Barriers & Cardiac Risk Stratification - 10/02/17 0926      Activity Barriers & Cardiac Risk Stratification   Activity Barriers  Joint Problems;Arthritis;Back Problems;Deconditioning;Muscular Weakness;Other (comment);Balance Concerns;History of Falls;Assistive Device    Comments  spinal stenosis, hip and knee pain, B    Cardiac Risk Stratification  High       6 Minute Walk: 6 Minute Walk    Row Name 10/02/17 1207         6 Minute Walk   Phase  Initial     Distance  457 feet     Walk Time  4.58 minutes     # of Rest Breaks  3     MPH  1.1     METS  -0.13     RPE  15     VO2 Peak  -0.46     Symptoms  Yes (comment)     Comments  fatigue. 3 short rest breaks for a total of 62 seconds     Resting HR  62 bpm     Resting BP  120/70     Resting Oxygen Saturation   97 %     Exercise Oxygen Saturation  during 6 min walk  99 %     Max Ex. HR  84 bpm     Max Ex. BP  138/80     2 Minute Post BP  120/70        Oxygen Initial Assessment:   Oxygen Re-Evaluation:   Oxygen Discharge (Final Oxygen Re-Evaluation):   Initial Exercise Prescription: Initial Exercise Prescription - 10/02/17 1200      Date of Initial Exercise RX and Referring Provider   Date  10/02/17    Referring Provider  Skeet Latch MD      Recumbant Bike   Level  1    Minutes  10    METs  1      NuStep   Level  1    SPM  50    Minutes  10    METs  1      Arm Ergometer   Level  --    Minutes  --    METs  --      Track   Laps  4    Minutes  10    METs  1.7      Prescription Details   Frequency (times per week)  3    Duration  Progress  to 30 minutes of continuous aerobic without signs/symptoms of physical distress      Intensity   THRR 40-80% of Max Heartrate  56-112    Ratings of Perceived Exertion  11-13    Perceived Dyspnea  0-4      Progression   Progression  Continue to progress workloads to maintain intensity without signs/symptoms of physical distress.      Resistance Training   Training Prescription  Yes    Weight  1lb    Reps  10-15       Perform Capillary Blood Glucose checks as needed.  Exercise Prescription Changes: Exercise Prescription Changes    Row Name 10/08/17 1703             Response to Exercise   Blood Pressure (Admit)  102/72       Blood Pressure (Exercise)  124/70       Blood Pressure (Exit)  124/60       Heart Rate (Admit)  87 bpm       Heart Rate (Exercise)  91 bpm       Heart Rate (Exit)  75 bpm       Rating of Perceived Exertion (Exercise)  15       Perceived Dyspnea (Exercise)  0       Symptoms  Generalized Pain 7/10       Comments  Pt oriented to exercise equipment        Duration  Progress to 30 minutes of  aerobic without signs/symptoms of physical distress       Intensity  THRR unchanged         Progression   Progression  Continue to progress workloads to maintain intensity without signs/symptoms of physical distress.       Average METs  1.7         Resistance Training   Training Prescription  Yes       Weight  2lbs        Reps  10-15       Time  10 Minutes         Interval Training   Interval Training  No         NuStep   Level  1       SPM  75       Minutes  20       METs  1.7          Exercise Comments: Exercise Comments    Row Name 10/08/17 1702           Exercise Comments  Pt oriented to exercise equipment. Pt's first day of exercise. Pt had generalized pain, 7/10 with exercise. Will continue to monitor and progress pt as tolerated.           Exercise Goals and Review: Exercise Goals    Row Name 10/02/17 0927             Exercise  Goals   Increase Physical Activity  Yes       Intervention  Provide advice, education, support and counseling  about physical activity/exercise needs.;Develop an individualized exercise prescription for aerobic and resistive training based on initial evaluation findings, risk stratification, comorbidities and participant's personal goals.       Expected Outcomes  Short Term: Attend rehab on a regular basis to increase amount of physical activity.;Long Term: Exercising regularly at least 3-5 days a week.;Long Term: Add in home exercise to make exercise part of routine and to increase amount of physical activity.       Increase Strength and Stamina  Yes improve functional mobility and be able to walk without pain       Intervention  Provide advice, education, support and counseling about physical activity/exercise needs.;Develop an individualized exercise prescription for aerobic and resistive training based on initial evaluation findings, risk stratification, comorbidities and participant's personal goals.       Expected Outcomes  Short Term: Increase workloads from initial exercise prescription for resistance, speed, and METs.;Short Term: Perform resistance training exercises routinely during rehab and add in resistance training at home;Long Term: Improve cardiorespiratory fitness, muscular endurance and strength as measured by increased METs and functional capacity (6MWT)       Able to understand and use rate of perceived exertion (RPE) scale  Yes       Intervention  Provide education and explanation on how to use RPE scale       Expected Outcomes  Short Term: Able to use RPE daily in rehab to express subjective intensity level;Long Term:  Able to use RPE to guide intensity level when exercising independently       Knowledge and understanding of Target Heart Rate Range (THRR)  Yes       Intervention  Provide education and explanation of THRR including how the numbers were predicted and where they are  located for reference       Expected Outcomes  Short Term: Able to state/look up THRR;Long Term: Able to use THRR to govern intensity when exercising independently;Short Term: Able to use daily as guideline for intensity in rehab       Able to check pulse independently  Yes       Intervention  Provide education and demonstration on how to check pulse in carotid and radial arteries.;Review the importance of being able to check your own pulse for safety during independent exercise       Expected Outcomes  Short Term: Able to explain why pulse checking is important during independent exercise;Long Term: Able to check pulse independently and accurately       Understanding of Exercise Prescription  Yes       Intervention  Provide education, explanation, and written materials on patient's individual exercise prescription       Expected Outcomes  Short Term: Able to explain program exercise prescription;Long Term: Able to explain home exercise prescription to exercise independently          Exercise Goals Re-Evaluation :    Discharge Exercise Prescription (Final Exercise Prescription Changes): Exercise Prescription Changes - 10/08/17 1703      Response to Exercise   Blood Pressure (Admit)  102/72    Blood Pressure (Exercise)  124/70    Blood Pressure (Exit)  124/60    Heart Rate (Admit)  87 bpm    Heart Rate (Exercise)  91 bpm    Heart Rate (Exit)  75 bpm    Rating of Perceived Exertion (Exercise)  15    Perceived Dyspnea (Exercise)  0    Symptoms  Generalized Pain 7/10  Comments  Pt oriented to exercise equipment     Duration  Progress to 30 minutes of  aerobic without signs/symptoms of physical distress    Intensity  THRR unchanged      Progression   Progression  Continue to progress workloads to maintain intensity without signs/symptoms of physical distress.    Average METs  1.7      Resistance Training   Training Prescription  Yes    Weight  2lbs     Reps  10-15    Time  10  Minutes      Interval Training   Interval Training  No      NuStep   Level  1    SPM  75    Minutes  20    METs  1.7       Nutrition:  Target Goals: Understanding of nutrition guidelines, daily intake of sodium 1500mg , cholesterol 200mg , calories 30% from fat and 7% or less from saturated fats, daily to have 5 or more servings of fruits and vegetables.  Biometrics: Pre Biometrics - 10/02/17 1216      Pre Biometrics   Height  5\' 7"  (1.702 m)    Weight  125 kg    Waist Circumference  52 inches    Hip Circumference  56.5 inches    Waist to Hip Ratio  0.92 %    BMI (Calculated)  43.15    Triceps Skinfold  45 mm    % Body Fat  56.2 %    Grip Strength  30 kg    Flexibility  0 in   LBP!   Single Leg Stand  0 seconds        Nutrition Therapy Plan and Nutrition Goals: Nutrition Therapy & Goals - 10/02/17 1214      Nutrition Therapy   Diet  consistent carbohydrate heart healthy      Personal Nutrition Goals   Nutrition Goal  Pt to identify and limit food sources of saturated fat, trans fat, and sodium    Personal Goal #2  Pt to identify food quantities necessary to achieve weight loss of 6-24 lbs. at graduation from cardiac rehab.       Intervention Plan   Intervention  Prescribe, educate and counsel regarding individualized specific dietary modifications aiming towards targeted core components such as weight, hypertension, lipid management, diabetes, heart failure and other comorbidities.    Expected Outcomes  Short Term Goal: Understand basic principles of dietary content, such as calories, fat, sodium, cholesterol and nutrients.       Nutrition Assessments: Nutrition Assessments - 10/02/17 1215      MEDFICTS Scores   Pre Score  35       Nutrition Goals Re-Evaluation:   Nutrition Goals Re-Evaluation:   Nutrition Goals Discharge (Final Nutrition Goals Re-Evaluation):   Psychosocial: Target Goals: Acknowledge presence or absence of significant depression  and/or stress, maximize coping skills, provide positive support system. Participant is able to verbalize types and ability to use techniques and skills needed for reducing stress and depression.  Initial Review & Psychosocial Screening: Initial Psych Review & Screening - 10/02/17 1244      Initial Review   Current issues with  (S) History of Depression   Patient is on [place]?  Yes   Patient has her sister for support     Barriers   Psychosocial barriers to participate in program  There are no identifiable  barriers or psychosocial needs.;The patient should benefit from training in stress management and relaxation.      Screening Interventions   Interventions  Encouraged to exercise    Expected Outcomes  Long Term Goal: Stressors or current issues are controlled or eliminated.       Quality of Life Scores: Quality of Life - 10/08/17 1500      Quality of Life   Select  Quality of Life      Quality of Life Scores   Health/Function Pre  19.11 %    Socioeconomic Pre  26.13 %    Psych/Spiritual Pre  22.29 %    Family Pre  24 %    GLOBAL Pre  22.08 %      Scores of 19 and below usually indicate a poorer quality of life in these areas.  A difference of  2-3 points is a clinically meaningful difference.  A difference of 2-3 points in the total score of the Quality of Life Index has been associated with significant improvement in overall quality of life, self-image, physical symptoms, and general health in studies assessing change in quality of life.  PHQ-9: Recent Review Flowsheet Data    Depression screen [facility] 2/9 10/08/2017   Decreased Interest 0   Down, Depressed, Hopeless 0   PHQ - 2 Score 0     Interpretation of Total Score  Total Score Depression Severity:  1-4 = Minimal depression, 5-9 = Mild depression, 10-14 = Moderate depression, 15-19 = Moderately severe depression, 20-27 = Severe depression   Psychosocial Evaluation and  Intervention: Psychosocial Evaluation - 10/08/17 1513      Psychosocial Evaluation & Interventions   Interventions  Encouraged to exercise with the program and follow exercise prescription;Stress management education;Relaxation education    Comments  No psychosocial needs identified. No interventions necessary.  Danaysia reports no issues with depression currently.  She enjoys doing Bar[employer] and other crafts.    Expected Outcomes  Charlyne will exhibit a positive outlook with good coping skills.    Continue Psychosocial Services   No Follow up required       Psychosocial Re-Evaluation:   Psychosocial Discharge (Final Psychosocial Re-Evaluation):   Vocational Rehabilitation: Provide vocational rehab assistance to qualifying candidates.   Vocational Rehab Evaluation & Intervention: Vocational Rehab - 10/02/17 1240      Initial Vocational Rehab Evaluation & Intervention   Assessment shows need for Vocational Rehabilitation  No   retired      Education: Education Goals: Education classes will be provided on a weekly basis, covering required topics. Participant will state understanding/return demonstration of topics presented.  Learning Barriers/Preferences: Learning Barriers/Preferences - 10/02/17 0926      Learning Barriers/Preferences   Learning Barriers  Sight    Learning Preferences  Pictoral;Individual Instruction;Skilled Demonstration;Verbal Instruction;Video;Written Material;Audio       Education Topics: Count Your Pulse:  -Group instruction provided by verbal instruction, demonstration, patient participation and written materials to support subject.  Instructors address importance of being able to find your pulse and how to count your pulse when at home without a heart monitor.  Patients get hands on experience counting their pulse with staff help and individually.   Heart Attack, Angina, and Risk Factor Modification:  -Group instruction provided by verbal  instruction, video, and written materials to support subject.  Instructors address signs and symptoms of angina and heart attacks.    Also discuss risk factors for heart disease and how to make changes to improve heart health risk  factors.   Functional Fitness:  -Group instruction provided by verbal instruction, demonstration, patient participation, and written materials to support subject.  Instructors address safety measures for doing things around the house.  Discuss how to get up and down off the floor, how to pick things up properly, how to safely get out of a chair without assistance, and balance training.   Meditation and Mindfulness:  -Group instruction provided by verbal instruction, patient participation, and written materials to support subject.  Instructor addresses importance of mindfulness and meditation practice to help reduce stress and improve awareness.  Instructor also leads participants through a meditation exercise.    Stretching for Flexibility and Mobility:  -Group instruction provided by verbal instruction, patient participation, and written materials to support subject.  Instructors lead participants through series of stretches that are designed to increase flexibility thus improving mobility.  These stretches are additional exercise for major muscle groups that are typically performed during regular warm up and cool down.   Hands Only CPR:  -Group verbal, video, and participation provides a basic overview of AHA guidelines for community CPR. Role-play of emergencies allow participants the opportunity to practice calling for help and chest compression technique with discussion of AED use.   Hypertension: -Group verbal and written instruction that provides a basic overview of hypertension including the most recent diagnostic guidelines, risk factor reduction with self-care instructions and medication management.    Nutrition I class: Heart Healthy Eating:  -Group  instruction provided by PowerPoint slides, verbal discussion, and written materials to support subject matter. The instructor gives an explanation and review of the Therapeutic Lifestyle Changes diet recommendations, which includes a discussion on lipid goals, dietary fat, sodium, fiber, plant stanol/sterol esters, sugar, and the components of a well-balanced, healthy diet.   Nutrition II class: Lifestyle Skills:  -Group instruction provided by PowerPoint slides, verbal discussion, and written materials to support subject matter. The instructor gives an explanation and review of label reading, grocery shopping for heart health, heart healthy recipe modifications, and ways to make healthier choices when eating out.   Diabetes Question & Answer:  -Group instruction provided by PowerPoint slides, verbal discussion, and written materials to support subject matter. The instructor gives an explanation and review of diabetes co-morbidities, pre- and post-prandial blood glucose goals, pre-exercise blood glucose goals, signs, symptoms, and treatment of hypoglycemia and hyperglycemia, and foot care basics.   Diabetes Blitz:  -Group instruction provided by PowerPoint slides, verbal discussion, and written materials to support subject matter. The instructor gives an explanation and review of the physiology behind type 1 and type 2 diabetes, diabetes medications and rational behind using different medications, pre- and post-prandial blood glucose recommendations and Hemoglobin A1c goals, diabetes diet, and exercise including blood glucose guidelines for exercising safely.    Portion Distortion:  -Group instruction provided by PowerPoint slides, verbal discussion, written materials, and food models to support subject matter. The instructor gives an explanation of serving size versus portion size, changes in portions sizes over the last 20 years, and what consists of a serving from each food group.   Stress  Management:  -Group instruction provided by verbal instruction, video, and written materials to support subject matter.  Instructors review role of stress in heart disease and how to cope with stress positively.     Exercising on Your Own:  -Group instruction provided by verbal instruction, power point, and written materials to support subject.  Instructors discuss benefits of exercise, components of exercise, frequency and intensity of exercise,  and end points for exercise.  Also discuss use of nitroglycerin and activating EMS.  Review options of places to exercise outside of rehab.  Review guidelines for sex with heart disease.   Cardiac Drugs I:  -Group instruction provided by verbal instruction and written materials to support subject.  Instructor reviews cardiac drug classes: antiplatelets, anticoagulants, beta blockers, and statins.  Instructor discusses reasons, side effects, and lifestyle considerations for each drug class.   Cardiac Drugs II:  -Group instruction provided by verbal instruction and written materials to support subject.  Instructor reviews cardiac drug classes: angiotensin converting enzyme inhibitors (ACE-I), angiotensin II receptor blockers (ARBs), nitrates, and calcium channel blockers.  Instructor discusses reasons, side effects, and lifestyle considerations for each drug class.   Anatomy and Physiology of the Circulatory System:  Group verbal and written instruction and models provide basic cardiac anatomy and physiology, with the coronary electrical and arterial systems. Review of: AMI, Angina, Valve disease, Heart Failure, Peripheral Artery Disease, Cardiac Arrhythmia, Pacemakers, and the ICD.   Other Education:  -Group or individual verbal, written, or video instructions that support the educational goals of the cardiac rehab program.   Holiday Eating Survival Tips:  -Group instruction provided by PowerPoint slides, verbal discussion, and written materials to  support subject matter. The instructor gives patients tips, tricks, and techniques to help them not only survive but enjoy the holidays despite the onslaught of food that accompanies the holidays.   Knowledge Questionnaire Score: Knowledge Questionnaire Score - 10/08/17 1555      Knowledge Questionnaire Score   Pre Score  21/24       Core Components/Risk Factors/Patient Goals at Admission: Personal Goals and Risk Factors at Admission - 10/02/17 1216      Core Components/Risk Factors/Patient Goals on Admission    Weight Management  Yes;Obesity;Weight Maintenance    Intervention  Weight Management: Develop a combined nutrition and exercise program designed to reach desired caloric intake, while maintaining appropriate intake of nutrient and fiber, sodium and fats, and appropriate energy expenditure required for the weight goal.;Weight Management: Provide education and appropriate resources to help participant work on and attain dietary goals.;Weight Management/Obesity: Establish reasonable short term and long term weight goals.;Obesity: Provide education and appropriate resources to help participant work on and attain dietary goals.    Admit Weight  275 lb 9.2 oz (125 kg)    Goal Weight: Short Term  270 lb (122.5 kg)    Goal Weight: Long Term  250 lb (113.4 kg)    Expected Outcomes  Short Term: Continue to assess and modify interventions until short term weight is achieved;Long Term: Adherence to nutrition and physical activity/exercise program aimed toward attainment of established weight goal;Weight Maintenance: Understanding of the daily nutrition guidelines, which includes 25-35% calories from fat, 7% or less cal from saturated fats, less than 200mg  cholesterol, less than 1.5gm of sodium, & 5 or more servings of fruits and vegetables daily;Weight Loss: Understanding of general recommendations for a balanced deficit meal plan, which promotes 1-2 lb weight loss per week and includes a negative  energy balance of [phone] kcal/d;Understanding recommendations for meals to include 15-35% energy as protein, 25-35% energy from fat, 35-60% energy from carbohydrates, less than 200mg  of dietary cholesterol, 20-35 gm of total fiber daily;Understanding of distribution of calorie intake throughout the day with the consumption of 4-5 meals/snacks    Diabetes  Yes    Intervention  Provide education about signs/symptoms and action to take for hypo/hyperglycemia.;Provide education about proper nutrition, including  hydration, and aerobic/resistive exercise prescription along with prescribed medications to achieve blood glucose in normal ranges: Fasting glucose 65-99 mg/dL    Expected Outcomes  Short Term: Participant verbalizes understanding of the signs/symptoms and immediate care of hyper/hypoglycemia, proper foot care and importance of medication, aerobic/resistive exercise and nutrition plan for blood glucose control.;Long Term: Attainment of HbA1C < 7%.    Hypertension  Yes    Intervention  Provide education on lifestyle modifcations including regular physical activity/exercise, weight management, moderate sodium restriction and increased consumption of fresh fruit, vegetables, and low fat dairy, alcohol moderation, and smoking cessation.;Monitor prescription use compliance.    Expected Outcomes  Short Term: Continued assessment and intervention until BP is < 140/67mm HG in hypertensive participants. < 130/62mm HG in hypertensive participants with diabetes, heart failure or chronic kidney disease.;Long Term: Maintenance of blood pressure at goal levels.    Lipids  Yes    Intervention  Provide education and support for participant on nutrition & aerobic/resistive exercise along with prescribed medications to achieve LDL 70mg , HDL >40mg .    Expected Outcomes  Short Term: Participant states understanding of desired cholesterol values and is compliant with medications prescribed. Participant is following  exercise prescription and nutrition guidelines.;Long Term: Cholesterol controlled with medications as prescribed, with individualized exercise RX and with personalized nutrition plan. Value goals: LDL < 70mg , HDL > 40 mg.       Core Components/Risk Factors/Patient Goals Review:  Goals and Risk Factor Review    Row Name 10/10/17 1558             Core Components/Risk Factors/Patient Goals Review   Personal Goals Review  Lipids;Diabetes;Hypertension;Weight Management/Obesity       Review  Pt with multiple CAD RF. Cathyann would like to become more active.       Expected Outcomes  Pt will participate in CR exercise, nutrition, and lifestyle modification opportunities.           Core Components/Risk Factors/Patient Goals at Discharge (Final Review):  Goals and Risk Factor Review - 10/10/17 1558      Core Components/Risk Factors/Patient Goals Review   Personal Goals Review  Lipids;Diabetes;Hypertension;Weight Management/Obesity    Review  Pt with multiple CAD RF. Timmie would like to become more active.    Expected Outcomes  Pt will participate in CR exercise, nutrition, and lifestyle modification opportunities.        ITP Comments: ITP Comments    Row Name 10/01/17 1027 10/02/17 0925 10/08/17 1216       ITP Comments  Dr. Fransico Him, Medical Director   Dr. Fransico Him, Medical Director   Velta Addison started CR exercise today.  She tolerated exercise well with some minor joint pain.  This is not a new finding. Adjustments made to her exercise prescription for this.        Comments: See ITP Comments.

## 2017-10-12 ENCOUNTER — Encounter (HOSPITAL_COMMUNITY)
Admission: RE | Admit: 2017-10-12 | Discharge: 2017-10-12 | Disposition: A | Discharge status: Home / Self Care | Payer: Medicare Other | Source: Ambulatory Visit | Attending: Cardiovascular Disease | Admitting: Cardiovascular Disease

## 2017-10-12 ENCOUNTER — Encounter (HOSPITAL_COMMUNITY): Payer: Medicare Other

## 2017-10-12 DIAGNOSIS — Z952 Presence of prosthetic heart valve: Secondary | ICD-10-CM

## 2017-10-12 DIAGNOSIS — E119 Type 2 diabetes mellitus without complications: Secondary | ICD-10-CM | POA: Diagnosis not present

## 2017-10-12 DIAGNOSIS — Z7984 Long term (current) use of oral hypoglycemic drugs: Secondary | ICD-10-CM | POA: Diagnosis not present

## 2017-10-12 DIAGNOSIS — I10 Essential (primary) hypertension: Secondary | ICD-10-CM | POA: Diagnosis not present

## 2017-10-12 DIAGNOSIS — Z853 Personal history of malignant neoplasm of breast: Secondary | ICD-10-CM | POA: Diagnosis not present

## 2017-10-12 DIAGNOSIS — Z79899 Other long term (current) drug therapy: Secondary | ICD-10-CM | POA: Diagnosis not present

## 2017-10-15 ENCOUNTER — Encounter (HOSPITAL_COMMUNITY)
Admission: RE | Admit: 2017-10-15 | Discharge: 2017-10-15 | Disposition: A | Discharge status: Home / Self Care | Payer: Medicare Other | Source: Ambulatory Visit | Attending: Cardiovascular Disease | Admitting: Cardiovascular Disease

## 2017-10-15 ENCOUNTER — Encounter (HOSPITAL_COMMUNITY): Payer: Medicare Other

## 2017-10-15 DIAGNOSIS — Z7984 Long term (current) use of oral hypoglycemic drugs: Secondary | ICD-10-CM | POA: Diagnosis not present

## 2017-10-15 DIAGNOSIS — E119 Type 2 diabetes mellitus without complications: Secondary | ICD-10-CM | POA: Diagnosis not present

## 2017-10-15 DIAGNOSIS — Z952 Presence of prosthetic heart valve: Secondary | ICD-10-CM

## 2017-10-15 DIAGNOSIS — I10 Essential (primary) hypertension: Secondary | ICD-10-CM | POA: Diagnosis not present

## 2017-10-15 DIAGNOSIS — Z79899 Other long term (current) drug therapy: Secondary | ICD-10-CM | POA: Diagnosis not present

## 2017-10-15 DIAGNOSIS — Z853 Personal history of malignant neoplasm of breast: Secondary | ICD-10-CM | POA: Diagnosis not present

## 2017-10-17 ENCOUNTER — Encounter (HOSPITAL_COMMUNITY): Payer: Medicare Other

## 2017-10-17 ENCOUNTER — Telehealth (HOSPITAL_COMMUNITY): Payer: Self-pay | Admitting: Family Medicine

## 2017-10-17 ENCOUNTER — Encounter: Payer: Self-pay | Admitting: Thoracic Surgery (Cardiothoracic Vascular Surgery)

## 2017-10-19 ENCOUNTER — Encounter (HOSPITAL_COMMUNITY)
Admission: RE | Admit: 2017-10-19 | Discharge: 2017-10-19 | Disposition: A | Discharge status: Home / Self Care | Payer: Medicare Other | Source: Ambulatory Visit | Attending: Cardiovascular Disease | Admitting: Cardiovascular Disease

## 2017-10-19 ENCOUNTER — Encounter (HOSPITAL_COMMUNITY): Payer: Medicare Other

## 2017-10-19 DIAGNOSIS — I10 Essential (primary) hypertension: Secondary | ICD-10-CM | POA: Diagnosis not present

## 2017-10-19 DIAGNOSIS — Z7984 Long term (current) use of oral hypoglycemic drugs: Secondary | ICD-10-CM | POA: Diagnosis not present

## 2017-10-19 DIAGNOSIS — Z853 Personal history of malignant neoplasm of breast: Secondary | ICD-10-CM | POA: Diagnosis not present

## 2017-10-19 DIAGNOSIS — Z79899 Other long term (current) drug therapy: Secondary | ICD-10-CM | POA: Diagnosis not present

## 2017-10-19 DIAGNOSIS — E119 Type 2 diabetes mellitus without complications: Secondary | ICD-10-CM | POA: Diagnosis not present

## 2017-10-19 DIAGNOSIS — Z952 Presence of prosthetic heart valve: Secondary | ICD-10-CM | POA: Diagnosis not present

## 2017-10-22 ENCOUNTER — Encounter (HOSPITAL_COMMUNITY)
Admission: RE | Admit: 2017-10-22 | Discharge: 2017-10-22 | Disposition: A | Discharge status: Home / Self Care | Payer: Medicare Other | Source: Ambulatory Visit | Attending: Cardiovascular Disease | Admitting: Cardiovascular Disease

## 2017-10-22 ENCOUNTER — Encounter (HOSPITAL_COMMUNITY): Payer: Medicare Other

## 2017-10-22 ENCOUNTER — Ambulatory Visit (INDEPENDENT_AMBULATORY_CARE_PROVIDER_SITE_OTHER): Payer: Medicare Other | Admitting: *Deleted

## 2017-10-22 DIAGNOSIS — I442 Atrioventricular block, complete: Secondary | ICD-10-CM | POA: Diagnosis not present

## 2017-10-22 DIAGNOSIS — Z853 Personal history of malignant neoplasm of breast: Secondary | ICD-10-CM | POA: Diagnosis not present

## 2017-10-22 DIAGNOSIS — I10 Essential (primary) hypertension: Secondary | ICD-10-CM | POA: Diagnosis not present

## 2017-10-22 DIAGNOSIS — Z952 Presence of prosthetic heart valve: Secondary | ICD-10-CM

## 2017-10-22 DIAGNOSIS — Z7984 Long term (current) use of oral hypoglycemic drugs: Secondary | ICD-10-CM | POA: Diagnosis not present

## 2017-10-22 DIAGNOSIS — E119 Type 2 diabetes mellitus without complications: Secondary | ICD-10-CM | POA: Diagnosis not present

## 2017-10-22 DIAGNOSIS — Z79899 Other long term (current) drug therapy: Secondary | ICD-10-CM | POA: Diagnosis not present

## 2017-10-23 NOTE — Progress Notes (Signed)
Remote pacemaker transmission.   

## 2017-10-24 ENCOUNTER — Encounter (HOSPITAL_COMMUNITY): Payer: Medicare Other

## 2017-10-24 ENCOUNTER — Encounter (HOSPITAL_COMMUNITY)
Admission: RE | Admit: 2017-10-24 | Discharge: 2017-10-24 | Disposition: A | Discharge status: Home / Self Care | Payer: Medicare Other | Source: Ambulatory Visit | Attending: Cardiovascular Disease | Admitting: Cardiovascular Disease

## 2017-10-24 DIAGNOSIS — I10 Essential (primary) hypertension: Secondary | ICD-10-CM | POA: Diagnosis not present

## 2017-10-24 DIAGNOSIS — E119 Type 2 diabetes mellitus without complications: Secondary | ICD-10-CM | POA: Diagnosis not present

## 2017-10-24 DIAGNOSIS — Z853 Personal history of malignant neoplasm of breast: Secondary | ICD-10-CM | POA: Diagnosis not present

## 2017-10-24 DIAGNOSIS — Z7984 Long term (current) use of oral hypoglycemic drugs: Secondary | ICD-10-CM | POA: Diagnosis not present

## 2017-10-24 DIAGNOSIS — Z952 Presence of prosthetic heart valve: Secondary | ICD-10-CM

## 2017-10-24 DIAGNOSIS — Z79899 Other long term (current) drug therapy: Secondary | ICD-10-CM | POA: Diagnosis not present

## 2017-10-26 ENCOUNTER — Encounter (HOSPITAL_COMMUNITY)
Admission: RE | Admit: 2017-10-26 | Discharge: 2017-10-26 | Disposition: A | Discharge status: Home / Self Care | Payer: Medicare Other | Source: Ambulatory Visit | Attending: Cardiovascular Disease | Admitting: Cardiovascular Disease

## 2017-10-26 ENCOUNTER — Encounter (HOSPITAL_COMMUNITY): Payer: Medicare Other

## 2017-10-26 DIAGNOSIS — Z952 Presence of prosthetic heart valve: Secondary | ICD-10-CM | POA: Diagnosis not present

## 2017-10-26 DIAGNOSIS — I10 Essential (primary) hypertension: Secondary | ICD-10-CM | POA: Diagnosis not present

## 2017-10-26 DIAGNOSIS — Z853 Personal history of malignant neoplasm of breast: Secondary | ICD-10-CM | POA: Diagnosis not present

## 2017-10-26 DIAGNOSIS — E119 Type 2 diabetes mellitus without complications: Secondary | ICD-10-CM | POA: Diagnosis not present

## 2017-10-26 DIAGNOSIS — Z79899 Other long term (current) drug therapy: Secondary | ICD-10-CM | POA: Diagnosis not present

## 2017-10-26 DIAGNOSIS — Z7984 Long term (current) use of oral hypoglycemic drugs: Secondary | ICD-10-CM | POA: Diagnosis not present

## 2017-10-29 ENCOUNTER — Encounter (HOSPITAL_COMMUNITY): Payer: Medicare Other

## 2017-10-29 ENCOUNTER — Encounter (HOSPITAL_COMMUNITY)
Admission: RE | Admit: 2017-10-29 | Discharge: 2017-10-29 | Disposition: A | Discharge status: Home / Self Care | Payer: Medicare Other | Source: Ambulatory Visit | Attending: Cardiovascular Disease | Admitting: Cardiovascular Disease

## 2017-10-29 DIAGNOSIS — Z853 Personal history of malignant neoplasm of breast: Secondary | ICD-10-CM | POA: Diagnosis not present

## 2017-10-29 DIAGNOSIS — I10 Essential (primary) hypertension: Secondary | ICD-10-CM | POA: Diagnosis not present

## 2017-10-29 DIAGNOSIS — Z79899 Other long term (current) drug therapy: Secondary | ICD-10-CM | POA: Diagnosis not present

## 2017-10-29 DIAGNOSIS — Z7984 Long term (current) use of oral hypoglycemic drugs: Secondary | ICD-10-CM | POA: Diagnosis not present

## 2017-10-29 DIAGNOSIS — Z952 Presence of prosthetic heart valve: Secondary | ICD-10-CM | POA: Diagnosis not present

## 2017-10-29 DIAGNOSIS — E119 Type 2 diabetes mellitus without complications: Secondary | ICD-10-CM | POA: Diagnosis not present

## 2017-10-30 ENCOUNTER — Encounter: Payer: Self-pay | Admitting: Cardiovascular Disease

## 2017-10-30 ENCOUNTER — Ambulatory Visit (INDEPENDENT_AMBULATORY_CARE_PROVIDER_SITE_OTHER): Payer: Medicare Other | Admitting: Cardiovascular Disease

## 2017-10-30 VITALS — BP 120/62 | HR 71 | Ht 67.0 in | Wt 272.0 lb

## 2017-10-30 DIAGNOSIS — I350 Nonrheumatic aortic (valve) stenosis: Secondary | ICD-10-CM | POA: Diagnosis not present

## 2017-10-30 DIAGNOSIS — I5033 Acute on chronic diastolic (congestive) heart failure: Secondary | ICD-10-CM | POA: Diagnosis not present

## 2017-10-30 DIAGNOSIS — Z5181 Encounter for therapeutic drug level monitoring: Secondary | ICD-10-CM | POA: Diagnosis not present

## 2017-10-30 DIAGNOSIS — R0602 Shortness of breath: Secondary | ICD-10-CM | POA: Diagnosis not present

## 2017-10-30 DIAGNOSIS — I10 Essential (primary) hypertension: Secondary | ICD-10-CM

## 2017-10-30 NOTE — Patient Instructions (Signed)
Medication Instructions:  INCREASE YOUR FUROSEMIDE (LASIX) TO TWICE A DAY FOR 2 DAYS ONLY  Labwork: FASTING LP/CMET/BNP THIS WEEK   Testing/Procedures: NONE  Follow-Up: Your physician recommends that you schedule a follow-up appointment in: 2 MONTHS   If you need a refill on your cardiac medications before your next appointment, please call your pharmacy.

## 2017-10-30 NOTE — Progress Notes (Signed)
Cardiology Office Note   Date:  10/30/2017   ID:  Normajean Nash, DOB 28-Oct-1935, MRN [id number]  PCP:  Leighton Ruff, MD  Cardiologist:   Skeet Latch, MD  Electrophysiologist: Allegra Lai, MD  No chief complaint on file.     History of Present Illness: Zamzam Whinery is a 82 y.o. female with hypertension, diabetes, prior DVT/PE, severe aortic stenosis s/p TAVR, complete heart block status post pacemaker, breast cancer status post left lumpectomy, and morbid obesity who presents for follow-up.  Ms. Rowton was admitted to the hospital 04/2016 with symptomatic bradycardia. She was noted to be in complete heart block with a ventricular rate in the 30s. Dr. Curt Bears implanted a St. Jude dual-chamber pacemaker on 04/21/16.  She had an echocardiogram 04/21/16 that revealed LVEF 60-65% with grade 1 diastolic dysfunction. She also had moderate aortic stenosis with a mean gradient of 33 mmHg. She was noted to have moderate mitral stenosis due to mitral annular calcification as well. The mean gradient 7 mmHg with a valve area by pressure half-time of 1.86 cm.  BNP was 612.    She had a repeat echocardiogram 05/2017 that revealed LVEF 55-60% and grade 1 diastolic dysfunction.  She was found to have critical aortic stenosis with a mean gradient of 62 mmHg.  She was referred to Dr. Burt Knack and underwent implantation of a 49mm Edwards Sapien 3 TAVR bioprosthesis 07/17/17.  Postoperative echo revealed normal systolic function and a mean valve gradient of 15 mmHg.  She required diuresis after the operation but was otherwise uneventful.  Since discharge Ms. Tardif has been doing well.  She still struggles with shortness of breath and lower extremity edema.  She has been participating in cardiac rehab and enjoys it.  However she does struggle with arthritis and it is sometimes difficult for her to exercise.  She gets a good urinary response from Lasix but has persistent edema.  She wears light compression  stockings but they do not seem to be helping.  She has not had any chest pain or pressure.   Past Medical History:  Diagnosis Date  . Anxiety   . Aortic stenosis   . Arthritis   . Breast cancer [facility])    CANCER  . Diabetes mellitus    Type II  . Dyspnea   . Heart murmur   . History of chemotherapy   . History of kidney stones   . Hypertension   . Mitral stenosis   . Morbid obesity (HCC)    BMI > 40  . Ovarian cancer ([place])   . Presence of permanent cardiac pacemaker    St. Jude  . Pulmonary embolism ([place]) 2013   post op  . 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 Spinal stenosis     Past Surgical History:  Procedure Laterality Date  . ABDOMINAL HYSTERECTOMY  2013  . BREAST SURGERY  03-2004   lumpectomy   . CARDIAC CATHETERIZATION    . INSERT / REPLACE / REMOVE PACEMAKER    . INTRAOPERATIVE TRANSTHORACIC ECHOCARDIOGRAM  07/17/2017   Procedure: INTRAOPERATIVE TRANSTHORACIC ECHOCARDIOGRAM;  Surgeon: Sherren Mocha, MD;  Location: [facility];  Service: Open Heart Surgery;;  . LITHOTRIPSY    . PACEMAKER IMPLANT N/A 04/21/2016   Procedure: Pacemaker Implant;  Surgeon: Will Meredith Leeds, MD;  Location: [facility];  Service: Cardiovascular;  Laterality: N/A;  . TONSILLECTOMY    .  TRANSCATHETER AORTIC VALVE REPLACEMENT, TRANSFEMORAL N/A 07/17/2017   Procedure: TRANSCATHETER AORTIC VALVE REPLACEMENT, TRANSFEMORAL;  Surgeon: Sherren Mocha, MD;  Location: [facility];  Service: Open Heart Surgery;  Laterality: N/A;     Current Outpatient Medications  Medication Sig Dispense Refill  . acetaminophen (TYLENOL) 650 MG CR tablet Take 1,300 mg by mouth every 8 (eight) hours as needed for pain.    [name] Kitchen amoxicillin (AMOXIL) 500 MG tablet Take 4 tablets (2,000 mg) one hour prior to dental visits. 8 tablet 6  . aspirin 81 MG tablet Take 81 mg by mouth daily.      [name] Kitchen  atorvastatin (LIPITOR) 20 MG tablet Take 20 mg by mouth at bedtime.     . Calcium Citrate-Vitamin D (CALCIUM CITRATE + PO) Take 1 tablet by mouth 2 (two) times daily.     . cholecalciferol (VITAMIN D) 1000 units tablet Take 1,000 Units by mouth 2 (two) times daily.     . clopidogrel (PLAVIX) 75 MG tablet Take 1 tablet (75 mg total) by mouth daily. 30 tablet 6  . fish oil-omega-3 fatty acids 1000 MG capsule Take 1 g by mouth every evening.     [name] Kitchen FLUoxetine (PROZAC) 40 MG capsule Take 40 mg by mouth daily.     . furosemide (LASIX) 40 MG tablet Take 60 mg by mouth daily.     [name] Kitchen LEVEMIR FLEXTOUCH 100 UNIT/ML Pen Inject 60-70 Units into the skin 2 (two) times daily. Per sliding scale    . losartan (COZAAR) 50 MG tablet Take 50 mg by mouth daily.    . Magnesium 400 MG CAPS Take 400 mg by mouth at bedtime.     . metFORMIN (GLUCOPHAGE) 1000 MG tablet Take 1,000 mg by mouth 2 (two) times daily with a meal.     . metoprolol tartrate (LOPRESSOR) 25 MG tablet Take 12.5 mg by mouth 2 (two) times daily.     . Multiple Vitamin (MULTIVITAMIN) tablet Take 1 tablet by mouth daily.       No current facility-administered medications for this visit.     Allergies:   Tape    Social History:  The patient  reports that she has quit smoking. Her smoking use included cigarettes. She has a 5.00 pack-year smoking history. She has never used smokeless tobacco. She reports that she does not drink alcohol or use drugs.   Family History:  The patient's family history includes Cancer in her brother, father, and sister.    ROS:  Please see the history of present illness.   Otherwise, review of systems are positive for none.   All other systems are reviewed and negative.    PHYSICAL EXAM: VS:  BP 120/62 (BP Location: Right Arm, Patient Position: Sitting, Cuff Size: Normal)   Pulse 71   Ht 5\' 7"  (1.702 m)   Wt 272 lb (123.4 kg)   BMI 42.60 kg/m  , BMI Body mass index is 42.6 kg/m. GENERAL:  Well appearing HEENT:  Pupils equal round and reactive, fundi not visualized, oral mucosa unremarkable NECK:  +JVD.  Waveform within normal limits, carotid upstroke brisk and symmetric, no bruits, no thyromegaly LYMPHATICS:  No cervical adenopathy LUNGS:  Clear to auscultation bilaterally HEART:  RRR.  PMI not displaced or sustained,S1 and S2 within normal limits, no S3, no S4, no clicks, no rubs, I/VI late-peaking systolic murmur at the RUSB. ABD:  Flat, positive bowel sounds normal in frequency in pitch, no bruits, no rebound, no guarding, no midline pulsatile mass, no  hepatomegaly, no splenomegaly EXT:  2 plus pulses throughout, 2+ tense, pitting LE edema, no cyanosis no clubbing SKIN:  No rashes no nodules NEURO:  Cranial nerves II through XII grossly intact, motor grossly intact throughout PSYCH:  Cognitively intact, oriented to person place and time  EKG:  EKG is not ordered today.  Echo 04/21/16: Study Conclusions  - Left ventricle: The cavity size was normal. Wall thickness was   increased in a pattern of severe LVH. Systolic function was   normal. The estimated ejection fraction was in the range of 60%   to 65%. Wall motion was normal; there were no regional wall   motion abnormalities. Doppler parameters are consistent with   abnormal left ventricular relaxation (grade 1 diastolic   dysfunction). - Aortic valve: Moderately to severely calcified annulus. Severely   thickened, severely calcified leaflets. There was moderate to   severe stenosis. There was trivial regurgitation. - Mitral valve: Severely calcified annulus. Severely calcified   leaflets . The findings are consistent with moderate stenosis.   There was mild to moderate regurgitation. Valve area by pressure   half-time: 1.86 cm^2. - Left atrium: The atrium was mildly dilated.  Echo 05/14/17: Study Conclusions  - Left ventricle: The cavity size was normal. There was moderate   concentric hypertrophy. Systolic function was normal.  The   estimated ejection fraction was in the range of 55% to 60%. Wall   motion was normal; there were no regional wall motion   abnormalities. Doppler parameters are consistent with abnormal   left ventricular relaxation (grade 1 diastolic dysfunction).   Doppler parameters are consistent with high ventricular filling   pressure. - Aortic valve: Probably trileaflet; severely thickened, severely   calcified leaflets. Valve mobility was restricted. There was   critical stenosis. There was no regurgitation. Peak velocity (S):   478 cm/s. Mean gradient (S): 62 mm Hg. Valve area (VTI): 0.57   cm^2. Valve area (Vmax): 0.65 cm^2. Valve area (Vmean): 0.51   cm^2. - Mitral valve: Severely calcified annulus. Mobility was   restricted. The findings are consistent with mild stenosis. There   was mild regurgitation. Valve area by pressure half-time: 1.93   cm^2. Valve area by continuity equation (using LVOT flow): 1.43   cm^2. - Left atrium: The atrium was severely dilated. - Right ventricle: The cavity size was normal. Wall thickness was   normal. Systolic function was normal. - Tricuspid valve: There was mild regurgitation. - Pulmonary arteries: Systolic pressure was mildly increased. PA   peak pressure: 44 mm Hg (S).  Recent Labs: 07/13/2017: ALT 18; B Natriuretic Peptide 312.1 07/18/2017: Hemoglobin 10.6; Magnesium 1.7; Platelets 130 08/22/2017: BUN 24; Creatinine, Ser 1.04; Potassium 4.5; Sodium 146    Lipid Panel No results found for: CHOL, TRIG, HDL, CHOLHDL, VLDL, LDLCALC, LDLDIRECT    Wt Readings from Last 3 Encounters:  10/30/17 272 lb (123.4 kg)  10/10/17 278 lb 3.5 oz (126.2 kg)  10/02/17 275 lb 9.2 oz (125 kg)      ASSESSMENT AND PLAN:  # Complete heart block s/p PPM: Ms. Macdonnell is doing well.  Managed by Dr. Curt Bears.   # Severe aortic stenosis:   # S/p TAVR: Ms. Kassa is doing well post-TAVR.  Mean gradient 16 mmHg on 08/2017.    # Acute on chronic diastolic heart  failure:  # Moderate mitral stenosis: Neck veins are elevated and she has increased LE edema.  Recommended that she increase her compression stockings to the moderate pressure.  We will also increase Lasix to 60 mg twice daily for 2 days.  She will return for a CMP at the end of the week.  We will check a BMP at the same time.  # Hypertension: Blood pressure well-controlled.  Continue losartan and metoprolol.  # Hyperlipidemia:  Continue atorvastatin.  Check lipids and a CMP this week.  # Likely OSA: Symptoms are very concerning for OSA.  Sleep study pending.   Current medicines are reviewed at length with the patient today.  The patient does not have concerns regarding medicines.  The following changes have been made:  no change  Labs/ tests ordered today include:   No orders of the defined types were placed in this encounter.    Disposition:   FU with Marvens Hollars C. Oval Linsey, MD, [facility] in 2 months.      Signed, Tarisha Fader C. Oval Linsey, MD, [facility]  10/30/2017 9:32 AM    [facility]

## 2017-10-31 ENCOUNTER — Encounter (HOSPITAL_COMMUNITY): Payer: Medicare Other

## 2017-10-31 ENCOUNTER — Encounter: Payer: Self-pay | Admitting: Cardiovascular Disease

## 2017-11-01 DIAGNOSIS — Z5181 Encounter for therapeutic drug level monitoring: Secondary | ICD-10-CM | POA: Diagnosis not present

## 2017-11-01 DIAGNOSIS — I10 Essential (primary) hypertension: Secondary | ICD-10-CM | POA: Diagnosis not present

## 2017-11-01 DIAGNOSIS — R0602 Shortness of breath: Secondary | ICD-10-CM | POA: Diagnosis not present

## 2017-11-02 ENCOUNTER — Encounter (HOSPITAL_COMMUNITY)
Admission: RE | Admit: 2017-11-02 | Discharge: 2017-11-02 | Disposition: A | Discharge status: Home / Self Care | Payer: Medicare Other | Source: Ambulatory Visit | Attending: Cardiovascular Disease | Admitting: Cardiovascular Disease

## 2017-11-02 ENCOUNTER — Other Ambulatory Visit: Payer: Self-pay | Admitting: Cardiovascular Disease

## 2017-11-02 ENCOUNTER — Encounter (HOSPITAL_COMMUNITY): Payer: Medicare Other

## 2017-11-02 DIAGNOSIS — Z853 Personal history of malignant neoplasm of breast: Secondary | ICD-10-CM | POA: Diagnosis not present

## 2017-11-02 DIAGNOSIS — E119 Type 2 diabetes mellitus without complications: Secondary | ICD-10-CM | POA: Diagnosis not present

## 2017-11-02 DIAGNOSIS — Z952 Presence of prosthetic heart valve: Secondary | ICD-10-CM

## 2017-11-02 DIAGNOSIS — Z79899 Other long term (current) drug therapy: Secondary | ICD-10-CM | POA: Diagnosis not present

## 2017-11-02 DIAGNOSIS — Z7984 Long term (current) use of oral hypoglycemic drugs: Secondary | ICD-10-CM | POA: Diagnosis not present

## 2017-11-02 DIAGNOSIS — I10 Essential (primary) hypertension: Secondary | ICD-10-CM | POA: Diagnosis not present

## 2017-11-02 LAB — COMPREHENSIVE METABOLIC PANEL
ALBUMIN: 4.5 g/dL (ref 3.5–4.7)
ALT: 18 IU/L (ref 0–32)
AST: 20 IU/L (ref 0–40)
Albumin/Globulin Ratio: 1.7 (ref 1.2–2.2)
Alkaline Phosphatase: 82 IU/L (ref 39–117)
BUN/Creatinine Ratio: 25 (ref 12–28)
BUN: 20 mg/dL (ref 8–27)
Bilirubin Total: 0.6 mg/dL (ref 0.0–1.2)
CHLORIDE: 105 mmol/L (ref 96–106)
CO2: 24 mmol/L (ref 20–29)
Calcium: 9.6 mg/dL (ref 8.7–10.3)
Creatinine, Ser: 0.8 mg/dL (ref 0.57–1.00)
GFR calc Af Amer: 81 mL/min/{1.73_m2} (ref >59)
GFR, EST NON AFRICAN AMERICAN: 70 mL/min/{1.73_m2} (ref >59)
GLOBULIN, TOTAL: 2.6 g/dL (ref 1.5–4.5)
Glucose: 96 mg/dL (ref 65–99)
Potassium: 4.4 mmol/L (ref 3.5–5.2)
Sodium: 146 mmol/L — ABNORMAL HIGH (ref 134–144)
Total Protein: 7.1 g/dL (ref 6.0–8.5)

## 2017-11-02 LAB — LIPID PANEL
CHOLESTEROL TOTAL: 126 mg/dL (ref 100–199)
Chol/HDL Ratio: 2.2 ratio (ref 0.0–4.4)
HDL: 58 mg/dL (ref >39)
LDL Calculated: 43 mg/dL (ref 0–99)
TRIGLYCERIDES: 127 mg/dL (ref 0–149)
VLDL CHOLESTEROL CAL: 25 mg/dL (ref 5–40)

## 2017-11-02 LAB — BRAIN NATRIURETIC PEPTIDE: BNP: 62.1 pg/mL (ref 0.0–100.0)

## 2017-11-07 ENCOUNTER — Other Ambulatory Visit: Payer: Self-pay | Admitting: *Deleted

## 2017-11-07 ENCOUNTER — Encounter (HOSPITAL_COMMUNITY)
Admission: RE | Admit: 2017-11-07 | Discharge: 2017-11-07 | Disposition: A | Discharge status: Home / Self Care | Payer: Medicare Other | Source: Ambulatory Visit | Attending: Cardiovascular Disease | Admitting: Cardiovascular Disease

## 2017-11-07 ENCOUNTER — Encounter (HOSPITAL_COMMUNITY): Payer: Self-pay

## 2017-11-07 ENCOUNTER — Encounter (HOSPITAL_COMMUNITY): Payer: Medicare Other

## 2017-11-07 DIAGNOSIS — Z79899 Other long term (current) drug therapy: Secondary | ICD-10-CM | POA: Insufficient documentation

## 2017-11-07 DIAGNOSIS — I10 Essential (primary) hypertension: Secondary | ICD-10-CM | POA: Insufficient documentation

## 2017-11-07 DIAGNOSIS — Z7984 Long term (current) use of oral hypoglycemic drugs: Secondary | ICD-10-CM | POA: Insufficient documentation

## 2017-11-07 DIAGNOSIS — M1712 Unilateral primary osteoarthritis, left knee: Secondary | ICD-10-CM | POA: Diagnosis not present

## 2017-11-07 DIAGNOSIS — E119 Type 2 diabetes mellitus without complications: Secondary | ICD-10-CM | POA: Diagnosis not present

## 2017-11-07 DIAGNOSIS — Z8543 Personal history of malignant neoplasm of ovary: Secondary | ICD-10-CM | POA: Insufficient documentation

## 2017-11-07 DIAGNOSIS — Z853 Personal history of malignant neoplasm of breast: Secondary | ICD-10-CM | POA: Insufficient documentation

## 2017-11-07 DIAGNOSIS — Z952 Presence of prosthetic heart valve: Secondary | ICD-10-CM | POA: Diagnosis not present

## 2017-11-07 DIAGNOSIS — M1711 Unilateral primary osteoarthritis, right knee: Secondary | ICD-10-CM | POA: Diagnosis not present

## 2017-11-08 NOTE — Progress Notes (Signed)
Cardiac Individual Treatment Plan  Patient Details  Name: Geniyah Eischeid MRN: [id number] Date of Birth: 1935-10-18 Referring Provider:     [place] from 10/02/2017 in [place]  Referring Provider  Skeet Latch MD      Initial Encounter Date:    CARDIAC REHAB PHASE II ORIENTATION from 10/02/2017 in [facility]  Date  10/02/17      Visit Diagnosis: 07/17/2017 S/P TAVR (transcatheter aortic valve replacement)  Patient's Home Medications on Admission:  Current Outpatient Medications:  .  acetaminophen (TYLENOL) 650 MG CR tablet, Take 1,300 mg by mouth every 8 (eight) hours as needed for pain., Disp: , Rfl:  .  amoxicillin (AMOXIL) 500 MG tablet, Take 4 tablets (2,000 mg) one hour prior to dental visits., Disp: 8 tablet, Rfl: 6 .  aspirin 81 MG tablet, Take 81 mg by mouth daily.  , Disp: , Rfl:  .  atorvastatin (LIPITOR) 20 MG tablet, Take 20 mg by mouth at bedtime. , Disp: , Rfl:  .  Calcium Citrate-Vitamin D (CALCIUM CITRATE + PO), Take 1 tablet by mouth 2 (two) times daily. , Disp: , Rfl:  .  cholecalciferol (VITAMIN D) 1000 units tablet, Take 1,000 Units by mouth 2 (two) times daily. , Disp: , Rfl:  .  clopidogrel (PLAVIX) 75 MG tablet, Take 1 tablet (75 mg total) by mouth daily., Disp: 30 tablet, Rfl: 6 .  fish oil-omega-3 fatty acids 1000 MG capsule, Take 1 g by mouth every evening. , Disp: , Rfl:  .  FLUoxetine (PROZAC) 40 MG capsule, Take 40 mg by mouth daily. , Disp: , Rfl:  .  furosemide (LASIX) 40 MG tablet, TAKE 1 TABLET BY MOUTH TWICE DAILY, Disp: 180 tablet, Rfl: 2 .  LEVEMIR FLEXTOUCH 100 UNIT/ML Pen, Inject 60-70 Units into the skin 2 (two) times daily. Per sliding scale, Disp: , Rfl:  .  losartan (COZAAR) 50 MG tablet, Take 50 mg by mouth daily., Disp: , Rfl:  .  Magnesium 400 MG CAPS, Take 400 mg by mouth at bedtime. , Disp: , Rfl:  .  metFORMIN (GLUCOPHAGE) 1000 MG tablet, Take  1,000 mg by mouth 2 (two) times daily with a meal. , Disp: , Rfl:  .  metoprolol tartrate (LOPRESSOR) 25 MG tablet, Take 12.5 mg by mouth 2 (two) times daily. , Disp: , Rfl:  .  Multiple Vitamin (MULTIVITAMIN) tablet, Take 1 tablet by mouth daily.  , Disp: , Rfl:   Past Medical History: Past Medical History:  Diagnosis Date  . Anxiety   . Aortic stenosis   . Arthritis   . Breast cancer [facility])    CANCER  . Diabetes mellitus    Type II  . Dyspnea   . Heart murmur   . History of chemotherapy   . History of kidney stones   . Hypertension   . Mitral stenosis   . Morbid obesity (HCC)    BMI > 40  . Ovarian cancer ([place])   . Presence of permanent cardiac pacemaker    St. Jude  . Pulmonary embolism ([place]) 2013   post op  . 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 Spinal stenosis     Tobacco Use: Social History   Tobacco Use  Smoking Status Former Smoker  . Packs/day: 1.00  . Years: 5.00  . Pack  years: 5.00  . Types: Cigarettes  Smokeless Tobacco Never Used    Labs: Recent Review Flowsheet Data    Labs for ITP Cardiac and Pulmonary Rehab Latest Ref Rng & Units 07/13/2017 07/17/2017 07/17/2017 07/17/2017 11/01/2017   Cholestrol 100 - 199 mg/dL - - - - 126   LDLCALC 0 - 99 mg/dL - - - - 43   HDL >39 mg/dL - - - - 58   Trlycerides 0 - 149 mg/dL - - - - 127   Hemoglobin A1c 4.8 - 5.6 % 6.6(H) - - - -   PHART 7.350 - 7.450 7.444 - - 7.298(L) -   PCO2ART 32.0 - 48.0 mmHg 39.1 - - 54.8(H) -   HCO3 20.0 - 28.0 mmol/L 26.3 - - 27.0 -   TCO2 22 - 32 mmol/L - 26 26 29  -   ACIDBASEDEF 0.0 - 2.0 mmol/L - - - - -   O2SAT % 97.2 - - 100.0 -      Capillary Blood Glucose: Lab Results  Component Value Date   GLUCAP 112 (H) 10/10/2017   GLUCAP 117 (H) 10/08/2017   GLUCAP 140 (H) 10/08/2017   GLUCAP 137 (H) 07/18/2017   GLUCAP 150 (H) 07/18/2017     Exercise  Target Goals: Exercise Program Goal: Individual exercise prescription set using results from initial 6 min walk test and THRR while considering  patient's activity barriers and safety.   Exercise Prescription Goal: Initial exercise prescription builds to 30-45 minutes a day of aerobic activity, 2-3 days per week.  Home exercise guidelines will be given to patient during program as part of exercise prescription that the participant will acknowledge.  Activity Barriers & Risk Stratification: Activity Barriers & Cardiac Risk Stratification - 10/02/17 0926      Activity Barriers & Cardiac Risk Stratification   Activity Barriers  Joint Problems;Arthritis;Back Problems;Deconditioning;Muscular Weakness;Other (comment);Balance Concerns;History of Falls;Assistive Device    Comments  spinal stenosis, hip and knee pain, B    Cardiac Risk Stratification  High       6 Minute Walk: 6 Minute Walk    Row Name 10/02/17 1207         6 Minute Walk   Phase  Initial     Distance  457 feet     Walk Time  4.58 minutes     # of Rest Breaks  3     MPH  1.1     METS  -0.13     RPE  15     VO2 Peak  -0.46     Symptoms  Yes (comment)     Comments  fatigue. 3 short rest breaks for a total of 62 seconds     Resting HR  62 bpm     Resting BP  120/70     Resting Oxygen Saturation   97 %     Exercise Oxygen Saturation  during 6 min walk  99 %     Max Ex. HR  84 bpm     Max Ex. BP  138/80     2 Minute Post BP  120/70        Oxygen Initial Assessment:   Oxygen Re-Evaluation:   Oxygen Discharge (Final Oxygen Re-Evaluation):   Initial Exercise Prescription: Initial Exercise Prescription - 10/02/17 1200      Date of Initial Exercise RX and Referring Provider   Date  10/02/17    Referring Provider  Skeet Latch MD      Recumbant Bike  Level  1    Minutes  10    METs  1      NuStep   Level  1    SPM  50    Minutes  10    METs  1      Arm Ergometer   Level  --    Minutes  --     METs  --      Track   Laps  4    Minutes  10    METs  1.7      Prescription Details   Frequency (times per week)  3    Duration  Progress to 30 minutes of continuous aerobic without signs/symptoms of physical distress      Intensity   THRR 40-80% of Max Heartrate  56-112    Ratings of Perceived Exertion  11-13    Perceived Dyspnea  0-4      Progression   Progression  Continue to progress workloads to maintain intensity without signs/symptoms of physical distress.      Resistance Training   Training Prescription  Yes    Weight  1lb    Reps  10-15       Perform Capillary Blood Glucose checks as needed.  Exercise Prescription Changes: Exercise Prescription Changes    Row Name 10/08/17 1703 10/24/17 1621 11/02/17 1359         Response to Exercise   Blood Pressure (Admit)  102/72  110/70  110/78     Blood Pressure (Exercise)  124/70  126/70  118/62     Blood Pressure (Exit)  124/60  110/60  126/68     Heart Rate (Admit)  87 bpm  84 bpm  81 bpm     Heart Rate (Exercise)  91 bpm  110 bpm  83 bpm     Heart Rate (Exit)  75 bpm  70 bpm  71 bpm     Rating of Perceived Exertion (Exercise)  15  14  14      Perceived Dyspnea (Exercise)  0  0  0     Symptoms  Generalized Pain 7/10  None   None      Comments  Pt oriented to exercise equipment   None   -     Duration  Progress to 30 minutes of  aerobic without signs/symptoms of physical distress  Continue with 30 min of aerobic exercise without signs/symptoms of physical distress.  Continue with 30 min of aerobic exercise without signs/symptoms of physical distress.     Intensity  THRR unchanged  THRR unchanged  THRR unchanged       Progression   Progression  Continue to progress workloads to maintain intensity without signs/symptoms of physical distress.  Continue to progress workloads to maintain intensity without signs/symptoms of physical distress.  Continue to progress workloads to maintain intensity without signs/symptoms of  physical distress.     Average METs  1.7  1.92  2.05       Resistance Training   Training Prescription  Yes  No  Yes     Weight  2lbs   -  3lbs     Reps  10-15  -  10-15     Time  10 Minutes  -  10 Minutes       Interval Training   Interval Training  No  No  No       NuStep   Level  1  1  1  SPM  75  85  90     Minutes  20  20  20      METs  1.7  1.8  1.9       Arm Ergometer   Level  -  1  1     Minutes  -  10  10     METs  -  2.5  2.05        Exercise Comments: Exercise Comments    Row Name 10/08/17 1702 11/01/17 0720         Exercise Comments  Pt oriented to exercise equipment. Pt's first day of exercise. Pt had generalized pain, 7/10 with exercise. Will continue to monitor and progress pt as tolerated.   Reviewed METs and Goals with pt. Pt is enjoying rehab and puts forth greath effort despite issues with chronic pain. Pt has a old friend in class and likes having a close friend with her. Will continue to monitor and progress pt.          Exercise Goals and Review: Exercise Goals    Row Name 10/02/17 0927             Exercise Goals   Increase Physical Activity  Yes       Intervention  Provide advice, education, support and counseling about physical activity/exercise needs.;Develop an individualized exercise prescription for aerobic and resistive training based on initial evaluation findings, risk stratification, comorbidities and participant's personal goals.       Expected Outcomes  Short Term: Attend rehab on a regular basis to increase amount of physical activity.;Long Term: Exercising regularly at least 3-5 days a week.;Long Term: Add in home exercise to make exercise part of routine and to increase amount of physical activity.       Increase Strength and Stamina  Yes improve functional mobility and be able to walk without pain       Intervention  Provide advice, education, support and counseling about physical activity/exercise needs.;Develop an  individualized exercise prescription for aerobic and resistive training based on initial evaluation findings, risk stratification, comorbidities and participant's personal goals.       Expected Outcomes  Short Term: Increase workloads from initial exercise prescription for resistance, speed, and METs.;Short Term: Perform resistance training exercises routinely during rehab and add in resistance training at home;Long Term: Improve cardiorespiratory fitness, muscular endurance and strength as measured by increased METs and functional capacity (6MWT)       Able to understand and use rate of perceived exertion (RPE) scale  Yes       Intervention  Provide education and explanation on how to use RPE scale       Expected Outcomes  Short Term: Able to use RPE daily in rehab to express subjective intensity level;Long Term:  Able to use RPE to guide intensity level when exercising independently       Knowledge and understanding of Target Heart Rate Range (THRR)  Yes       Intervention  Provide education and explanation of THRR including how the numbers were predicted and where they are located for reference       Expected Outcomes  Short Term: Able to state/look up THRR;Long Term: Able to use THRR to govern intensity when exercising independently;Short Term: Able to use daily as guideline for intensity in rehab       Able to check pulse independently  Yes       Intervention  Provide education and demonstration on how to  check pulse in carotid and radial arteries.;Review the importance of being able to check your own pulse for safety during independent exercise       Expected Outcomes  Short Term: Able to explain why pulse checking is important during independent exercise;Long Term: Able to check pulse independently and accurately       Understanding of Exercise Prescription  Yes       Intervention  Provide education, explanation, and written materials on patient's individual exercise prescription       Expected  Outcomes  Short Term: Able to explain program exercise prescription;Long Term: Able to explain home exercise prescription to exercise independently          Exercise Goals Re-Evaluation :   Discharge Exercise Prescription (Final Exercise Prescription Changes): Exercise Prescription Changes - 11/02/17 1359      Response to Exercise   Blood Pressure (Admit)  110/78    Blood Pressure (Exercise)  118/62    Blood Pressure (Exit)  126/68    Heart Rate (Admit)  81 bpm    Heart Rate (Exercise)  83 bpm    Heart Rate (Exit)  71 bpm    Rating of Perceived Exertion (Exercise)  14    Perceived Dyspnea (Exercise)  0    Symptoms  None     Duration  Continue with 30 min of aerobic exercise without signs/symptoms of physical distress.    Intensity  THRR unchanged      Progression   Progression  Continue to progress workloads to maintain intensity without signs/symptoms of physical distress.    Average METs  2.05      Resistance Training   Training Prescription  Yes    Weight  3lbs    Reps  10-15    Time  10 Minutes      Interval Training   Interval Training  No      NuStep   Level  1    SPM  90    Minutes  20    METs  1.9      Arm Ergometer   Level  1    Minutes  10    METs  2.05       Nutrition:  Target Goals: Understanding of nutrition guidelines, daily intake of sodium 1500mg , cholesterol 200mg , calories 30% from fat and 7% or less from saturated fats, daily to have 5 or more servings of fruits and vegetables.  Biometrics: Pre Biometrics - 10/02/17 1216      Pre Biometrics   Height  5\' 7"  (1.702 m)    Weight  125 kg    Waist Circumference  52 inches    Hip Circumference  56.5 inches    Waist to Hip Ratio  0.92 %    BMI (Calculated)  43.15    Triceps Skinfold  45 mm    % Body Fat  56.2 %    Grip Strength  30 kg    Flexibility  0 in   LBP!   Single Leg Stand  0 seconds        Nutrition Therapy Plan and Nutrition Goals: Nutrition Therapy & Goals - 10/02/17  1214      Nutrition Therapy   Diet  consistent carbohydrate heart healthy      Personal Nutrition Goals   Nutrition Goal  Pt to identify and limit food sources of saturated fat, trans fat, and sodium    Personal Goal #2  Pt to identify food quantities necessary to achieve weight  loss of 6-24 lbs. at graduation from cardiac rehab.       Intervention Plan   Intervention  Prescribe, educate and counsel regarding individualized specific dietary modifications aiming towards targeted core components such as weight, hypertension, lipid management, diabetes, heart failure and other comorbidities.    Expected Outcomes  Short Term Goal: Understand basic principles of dietary content, such as calories, fat, sodium, cholesterol and nutrients.       Nutrition Assessments: Nutrition Assessments - 10/02/17 1215      MEDFICTS Scores   Pre Score  35       Nutrition Goals Re-Evaluation:   Nutrition Goals Re-Evaluation:   Nutrition Goals Discharge (Final Nutrition Goals Re-Evaluation):   Psychosocial: Target Goals: Acknowledge presence or absence of significant depression and/or stress, maximize coping skills, provide positive support system. Participant is able to verbalize types and ability to use techniques and skills needed for reducing stress and depression.  Initial Review & Psychosocial Screening: Initial Psych Review & Screening - 10/02/17 1244      Initial Review   Current issues with  (S) History of Depression   Patient is on [place]?  Yes   Patient has her sister for support     Barriers   Psychosocial barriers to participate in program  There are no identifiable barriers or psychosocial needs.;The patient should benefit from training in stress management and relaxation.      Screening Interventions   Interventions  Encouraged to exercise    Expected Outcomes  Long Term Goal: Stressors or current issues are controlled or eliminated.        Quality of Life Scores: Quality of Life - 10/08/17 1500      Quality of Life   Select  Quality of Life      Quality of Life Scores   Health/Function Pre  19.11 %    Socioeconomic Pre  26.13 %    Psych/Spiritual Pre  22.29 %    Family Pre  24 %    GLOBAL Pre  22.08 %      Scores of 19 and below usually indicate a poorer quality of life in these areas.  A difference of  2-3 points is a clinically meaningful difference.  A difference of 2-3 points in the total score of the Quality of Life Index has been associated with significant improvement in overall quality of life, self-image, physical symptoms, and general health in studies assessing change in quality of life.  PHQ-9: Recent Review Flowsheet Data    Depression screen [facility] 2/9 10/08/2017   Decreased Interest 0   Down, Depressed, Hopeless 0   PHQ - 2 Score 0     Interpretation of Total Score  Total Score Depression Severity:  1-4 = Minimal depression, 5-9 = Mild depression, 10-14 = Moderate depression, 15-19 = Moderately severe depression, 20-27 = Severe depression   Psychosocial Evaluation and Intervention: Psychosocial Evaluation - 10/08/17 1513      Psychosocial Evaluation & Interventions   Interventions  Encouraged to exercise with the program and follow exercise prescription;Stress management education;Relaxation education    Comments  No psychosocial needs identified. No interventions necessary.  Alencia reports no issues with depression currently.  She enjoys doing Bar[employer] and other crafts.    Expected Outcomes  Donja will exhibit a positive outlook with good coping skills.    Continue Psychosocial Services   No Follow up required  Psychosocial Re-Evaluation: Psychosocial Re-Evaluation    [place] Name 11/07/17 1651             Psychosocial Re-Evaluation   Current issues with  History of Depression       Comments  No psychosocial needs identified. No interventions necessary.        Expected  Outcomes  Rosela will maintain a positive outlook with good coping skills reporting managment of her depression on the current regimen.       Interventions  Encouraged to attend Cardiac Rehabilitation for the exercise;Relaxation education;Stress management education       Continue Psychosocial Services   No Follow up required          Psychosocial Discharge (Final Psychosocial Re-Evaluation): Psychosocial Re-Evaluation - 11/07/17 1651      Psychosocial Re-Evaluation   Current issues with  History of Depression    Comments  No psychosocial needs identified. No interventions necessary.     Expected Outcomes  Ginnette will maintain a positive outlook with good coping skills reporting managment of her depression on the current regimen.    Interventions  Encouraged to attend Cardiac Rehabilitation for the exercise;Relaxation education;Stress management education    Continue Psychosocial Services   No Follow up required       Vocational Rehabilitation: Provide vocational rehab assistance to qualifying candidates.   Vocational Rehab Evaluation & Intervention: Vocational Rehab - 10/02/17 1240      Initial Vocational Rehab Evaluation & Intervention   Assessment shows need for Vocational Rehabilitation  No   retired      Education: Education Goals: Education classes will be provided on a weekly basis, covering required topics. Participant will state understanding/return demonstration of topics presented.  Learning Barriers/Preferences: Learning Barriers/Preferences - 10/02/17 0926      Learning Barriers/Preferences   Learning Barriers  Sight    Learning Preferences  Pictoral;Individual Instruction;Skilled Demonstration;Verbal Instruction;Video;Written Material;Audio       Education Topics: Count Your Pulse:  -Group instruction provided by verbal instruction, demonstration, patient participation and written materials to support subject.  Instructors address importance of being able  to find your pulse and how to count your pulse when at home without a heart monitor.  Patients get hands on experience counting their pulse with staff help and individually.   CARDIAC REHAB PHASE II EXERCISE from 10/26/2017 in [place]  Date  10/12/17  Instruction Review Code  2- Demonstrated Understanding      Heart Attack, Angina, and Risk Factor Modification:  -Group instruction provided by verbal instruction, video, and written materials to support subject.  Instructors address signs and symptoms of angina and heart attacks.    Also discuss risk factors for heart disease and how to make changes to improve heart health risk factors.   Functional Fitness:  -Group instruction provided by verbal instruction, demonstration, patient participation, and written materials to support subject.  Instructors address safety measures for doing things around the house.  Discuss how to get up and down off the floor, how to pick things up properly, how to safely get out of a chair without assistance, and balance training.   CARDIAC REHAB PHASE II EXERCISE from 10/26/2017 in [place]  Date  10/19/17  Instruction Review Code  2- Demonstrated Understanding      Meditation and Mindfulness:  -Group instruction provided by verbal instruction, patient participation, and written materials to support subject.  Instructor addresses importance of mindfulness and meditation practice to help  reduce stress and improve awareness.  Instructor also leads participants through a meditation exercise.    Stretching for Flexibility and Mobility:  -Group instruction provided by verbal instruction, patient participation, and written materials to support subject.  Instructors lead participants through series of stretches that are designed to increase flexibility thus improving mobility.  These stretches are additional exercise for major muscle groups that are  typically performed during regular warm up and cool down.   Hands Only CPR:  -Group verbal, video, and participation provides a basic overview of AHA guidelines for community CPR. Role-play of emergencies allow participants the opportunity to practice calling for help and chest compression technique with discussion of AED use.   Hypertension: -Group verbal and written instruction that provides a basic overview of hypertension including the most recent diagnostic guidelines, risk factor reduction with self-care instructions and medication management.   CARDIAC REHAB PHASE II EXERCISE from 10/26/2017 in [place]  Date  10/26/17  Instruction Review Code  2- Demonstrated Understanding       Nutrition I class: Heart Healthy Eating:  -Group instruction provided by PowerPoint slides, verbal discussion, and written materials to support subject matter. The instructor gives an explanation and review of the Therapeutic Lifestyle Changes diet recommendations, which includes a discussion on lipid goals, dietary fat, sodium, fiber, plant stanol/sterol esters, sugar, and the components of a well-balanced, healthy diet.   Nutrition II class: Lifestyle Skills:  -Group instruction provided by PowerPoint slides, verbal discussion, and written materials to support subject matter. The instructor gives an explanation and review of label reading, grocery shopping for heart health, heart healthy recipe modifications, and ways to make healthier choices when eating out.   Diabetes Question & Answer:  -Group instruction provided by PowerPoint slides, verbal discussion, and written materials to support subject matter. The instructor gives an explanation and review of diabetes co-morbidities, pre- and post-prandial blood glucose goals, pre-exercise blood glucose goals, signs, symptoms, and treatment of hypoglycemia and hyperglycemia, and foot care basics.   Diabetes Blitz:  -Group  instruction provided by PowerPoint slides, verbal discussion, and written materials to support subject matter. The instructor gives an explanation and review of the physiology behind type 1 and type 2 diabetes, diabetes medications and rational behind using different medications, pre- and post-prandial blood glucose recommendations and Hemoglobin A1c goals, diabetes diet, and exercise including blood glucose guidelines for exercising safely.    Portion Distortion:  -Group instruction provided by PowerPoint slides, verbal discussion, written materials, and food models to support subject matter. The instructor gives an explanation of serving size versus portion size, changes in portions sizes over the last 20 years, and what consists of a serving from each food group.   Stress Management:  -Group instruction provided by verbal instruction, video, and written materials to support subject matter.  Instructors review role of stress in heart disease and how to cope with stress positively.     Exercising on Your Own:  -Group instruction provided by verbal instruction, power point, and written materials to support subject.  Instructors discuss benefits of exercise, components of exercise, frequency and intensity of exercise, and end points for exercise.  Also discuss use of nitroglycerin and activating EMS.  Review options of places to exercise outside of rehab.  Review guidelines for sex with heart disease.   CARDIAC REHAB PHASE II EXERCISE from 10/26/2017 in [place]  Date  10/24/17  Educator  EP  Instruction Review Code  2-  Demonstrated Understanding      Cardiac Drugs I:  -Group instruction provided by verbal instruction and written materials to support subject.  Instructor reviews cardiac drug classes: antiplatelets, anticoagulants, beta blockers, and statins.  Instructor discusses reasons, side effects, and lifestyle considerations for each drug class.   Cardiac  Drugs II:  -Group instruction provided by verbal instruction and written materials to support subject.  Instructor reviews cardiac drug classes: angiotensin converting enzyme inhibitors (ACE-I), angiotensin II receptor blockers (ARBs), nitrates, and calcium channel blockers.  Instructor discusses reasons, side effects, and lifestyle considerations for each drug class.   Anatomy and Physiology of the Circulatory System:  Group verbal and written instruction and models provide basic cardiac anatomy and physiology, with the coronary electrical and arterial systems. Review of: AMI, Angina, Valve disease, Heart Failure, Peripheral Artery Disease, Cardiac Arrhythmia, Pacemakers, and the ICD.   Other Education:  -Group or individual verbal, written, or video instructions that support the educational goals of the cardiac rehab program.   Holiday Eating Survival Tips:  -Group instruction provided by PowerPoint slides, verbal discussion, and written materials to support subject matter. The instructor gives patients tips, tricks, and techniques to help them not only survive but enjoy the holidays despite the onslaught of food that accompanies the holidays.   Knowledge Questionnaire Score: Knowledge Questionnaire Score - 10/08/17 1555      Knowledge Questionnaire Score   Pre Score  21/24       Core Components/Risk Factors/Patient Goals at Admission: Personal Goals and Risk Factors at Admission - 10/02/17 1216      Core Components/Risk Factors/Patient Goals on Admission    Weight Management  Yes;Obesity;Weight Maintenance    Intervention  Weight Management: Develop a combined nutrition and exercise program designed to reach desired caloric intake, while maintaining appropriate intake of nutrient and fiber, sodium and fats, and appropriate energy expenditure required for the weight goal.;Weight Management: Provide education and appropriate resources to help participant work on and attain dietary  goals.;Weight Management/Obesity: Establish reasonable short term and long term weight goals.;Obesity: Provide education and appropriate resources to help participant work on and attain dietary goals.    Admit Weight  275 lb 9.2 oz (125 kg)    Goal Weight: Short Term  270 lb (122.5 kg)    Goal Weight: Long Term  250 lb (113.4 kg)    Expected Outcomes  Short Term: Continue to assess and modify interventions until short term weight is achieved;Long Term: Adherence to nutrition and physical activity/exercise program aimed toward attainment of established weight goal;Weight Maintenance: Understanding of the daily nutrition guidelines, which includes 25-35% calories from fat, 7% or less cal from saturated fats, less than 200mg  cholesterol, less than 1.5gm of sodium, & 5 or more servings of fruits and vegetables daily;Weight Loss: Understanding of general recommendations for a balanced deficit meal plan, which promotes 1-2 lb weight loss per week and includes a negative energy balance of [phone] kcal/d;Understanding recommendations for meals to include 15-35% energy as protein, 25-35% energy from fat, 35-60% energy from carbohydrates, less than 200mg  of dietary cholesterol, 20-35 gm of total fiber daily;Understanding of distribution of calorie intake throughout the day with the consumption of 4-5 meals/snacks    Diabetes  Yes    Intervention  Provide education about signs/symptoms and action to take for hypo/hyperglycemia.;Provide education about proper nutrition, including hydration, and aerobic/resistive exercise prescription along with prescribed medications to achieve blood glucose in normal ranges: Fasting glucose 65-99 mg/dL    Expected Outcomes  Short  Term: Participant verbalizes understanding of the signs/symptoms and immediate care of hyper/hypoglycemia, proper foot care and importance of medication, aerobic/resistive exercise and nutrition plan for blood glucose control.;Long Term: Attainment of HbA1C <  7%.    Hypertension  Yes    Intervention  Provide education on lifestyle modifcations including regular physical activity/exercise, weight management, moderate sodium restriction and increased consumption of fresh fruit, vegetables, and low fat dairy, alcohol moderation, and smoking cessation.;Monitor prescription use compliance.    Expected Outcomes  Short Term: Continued assessment and intervention until BP is < 140/3mm HG in hypertensive participants. < 130/27mm HG in hypertensive participants with diabetes, heart failure or chronic kidney disease.;Long Term: Maintenance of blood pressure at goal levels.    Lipids  Yes    Intervention  Provide education and support for participant on nutrition & aerobic/resistive exercise along with prescribed medications to achieve LDL 70mg , HDL >40mg .    Expected Outcomes  Short Term: Participant states understanding of desired cholesterol values and is compliant with medications prescribed. Participant is following exercise prescription and nutrition guidelines.;Long Term: Cholesterol controlled with medications as prescribed, with individualized exercise RX and with personalized nutrition plan. Value goals: LDL < 70mg , HDL > 40 mg.       Core Components/Risk Factors/Patient Goals Review:  Goals and Risk Factor Review    Row Name 10/10/17 1558 11/08/17 1051           Core Components/Risk Factors/Patient Goals Review   Personal Goals Review  Lipids;Diabetes;Hypertension;Weight Management/Obesity  Lipids;Diabetes;Hypertension;Weight Management/Obesity      Review  Pt with multiple CAD RF. Gwendy would like to become more active.  Pt with multiple CAD RF. Wilmetta is tolerating exercise well despite having joint pain.  She has great attendance.       Expected Outcomes  Pt will participate in CR exercise, nutrition, and lifestyle modification opportunities.   Pt will participate in CR exercise, nutrition, and lifestyle modification opportunities.           Core Components/Risk Factors/Patient Goals at Discharge (Final Review):  Goals and Risk Factor Review - 11/08/17 1051      Core Components/Risk Factors/Patient Goals Review   Personal Goals Review  Lipids;Diabetes;Hypertension;Weight Management/Obesity    Review  Pt with multiple CAD RF. Kimoni is tolerating exercise well despite having joint pain.  She has great attendance.     Expected Outcomes  Pt will participate in CR exercise, nutrition, and lifestyle modification opportunities.        ITP Comments: ITP Comments    Row Name 10/01/17 1027 10/02/17 0925 10/08/17 1216 11/07/17 1649     ITP Comments  Dr. Fransico Him, Medical Director   Dr. Fransico Him, Medical Director   Velta Addison started CR exercise today.  She tolerated exercise well with some minor joint pain.  This is not a new finding. Adjustments made to her exercise prescription for this.  30 Day ITP Review.  Adrieanna continues to tolerate exercise well despite having joint pain.  She has better days than others with her pain. She has great attendance.        Comments: See ITP Comments.

## 2017-11-09 ENCOUNTER — Encounter (HOSPITAL_COMMUNITY)
Admission: RE | Admit: 2017-11-09 | Discharge: 2017-11-09 | Disposition: A | Discharge status: Home / Self Care | Payer: Medicare Other | Source: Ambulatory Visit | Attending: Cardiovascular Disease | Admitting: Cardiovascular Disease

## 2017-11-09 ENCOUNTER — Encounter (HOSPITAL_COMMUNITY): Payer: Medicare Other

## 2017-11-09 DIAGNOSIS — Z7984 Long term (current) use of oral hypoglycemic drugs: Secondary | ICD-10-CM | POA: Diagnosis not present

## 2017-11-09 DIAGNOSIS — Z952 Presence of prosthetic heart valve: Secondary | ICD-10-CM | POA: Diagnosis not present

## 2017-11-09 DIAGNOSIS — E119 Type 2 diabetes mellitus without complications: Secondary | ICD-10-CM | POA: Diagnosis not present

## 2017-11-09 DIAGNOSIS — Z853 Personal history of malignant neoplasm of breast: Secondary | ICD-10-CM | POA: Diagnosis not present

## 2017-11-09 DIAGNOSIS — Z79899 Other long term (current) drug therapy: Secondary | ICD-10-CM | POA: Diagnosis not present

## 2017-11-09 DIAGNOSIS — I10 Essential (primary) hypertension: Secondary | ICD-10-CM | POA: Diagnosis not present

## 2017-11-12 ENCOUNTER — Encounter (HOSPITAL_COMMUNITY): Payer: Medicare Other

## 2017-11-14 ENCOUNTER — Encounter (HOSPITAL_COMMUNITY): Payer: Medicare Other

## 2017-11-14 ENCOUNTER — Encounter (HOSPITAL_COMMUNITY)
Admission: RE | Admit: 2017-11-14 | Discharge: 2017-11-14 | Disposition: A | Discharge status: Home / Self Care | Payer: Medicare Other | Source: Ambulatory Visit | Attending: Cardiovascular Disease | Admitting: Cardiovascular Disease

## 2017-11-14 DIAGNOSIS — C562 Malignant neoplasm of left ovary: Secondary | ICD-10-CM | POA: Diagnosis not present

## 2017-11-14 DIAGNOSIS — C50212 Malignant neoplasm of upper-inner quadrant of left female breast: Secondary | ICD-10-CM | POA: Diagnosis not present

## 2017-11-14 DIAGNOSIS — Z9189 Other specified personal risk factors, not elsewhere classified: Secondary | ICD-10-CM | POA: Diagnosis not present

## 2017-11-14 DIAGNOSIS — Z6841 Body Mass Index (BMI) 40.0 and over, adult: Secondary | ICD-10-CM | POA: Diagnosis not present

## 2017-11-14 DIAGNOSIS — Z794 Long term (current) use of insulin: Secondary | ICD-10-CM | POA: Diagnosis not present

## 2017-11-14 DIAGNOSIS — Z952 Presence of prosthetic heart valve: Secondary | ICD-10-CM

## 2017-11-14 DIAGNOSIS — E119 Type 2 diabetes mellitus without complications: Secondary | ICD-10-CM | POA: Diagnosis not present

## 2017-11-14 DIAGNOSIS — Z170 Estrogen receptor positive status [ER+]: Secondary | ICD-10-CM | POA: Diagnosis not present

## 2017-11-14 NOTE — Progress Notes (Signed)
Incomplete Session Note  Patient Details  Name: [name] MRN: [id number] Date of Birth: 04-Oct-1935 Referring Provider:     CARDIAC REHAB PHASE II ORIENTATION from 10/02/2017 in [place]  Referring Provider  Skeet Latch MD      [facility] did not complete her rehab session.  Ashlyne endorses increased chronic pain.  Her knees, back, and hips hurt constantly after she leaves CR.  She is unable to sleep at night because "I wake up if I move because it hurts too much."  Lendora wishes to stop participating in the program.  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  Elleni will be discharged from the CR program.

## 2017-11-16 ENCOUNTER — Encounter (HOSPITAL_COMMUNITY): Payer: Medicare Other

## 2017-11-19 ENCOUNTER — Encounter (HOSPITAL_COMMUNITY): Payer: Medicare Other

## 2017-11-21 ENCOUNTER — Encounter (HOSPITAL_COMMUNITY): Payer: Medicare Other

## 2017-11-23 ENCOUNTER — Encounter (HOSPITAL_COMMUNITY): Payer: Medicare Other

## 2017-11-26 ENCOUNTER — Encounter (HOSPITAL_COMMUNITY): Payer: Medicare Other

## 2017-11-26 LAB — CUP PACEART REMOTE DEVICE CHECK
Battery Voltage: 3.01 V
Brady Statistic AP VP Percent: 4.3 %
Brady Statistic AP VS Percent: 6.3 %
Brady Statistic AS VS Percent: 51 %
Brady Statistic RV Percent Paced: 41 %
Implantable Lead Implant Date: 20180216
Implantable Lead Location: 753859
Implantable Lead Location: 753860
Lead Channel Impedance Value: 480 Ohm
Lead Channel Pacing Threshold Amplitude: 0.625 V
Lead Channel Pacing Threshold Pulse Width: 0.5 ms
Lead Channel Pacing Threshold Pulse Width: 0.5 ms
Lead Channel Sensing Intrinsic Amplitude: 4 mV
Lead Channel Setting Pacing Amplitude: 0.875
Lead Channel Setting Pacing Pulse Width: 0.5 ms
Lead Channel Setting Sensing Sensitivity: 2.5 mV
MDC IDC LEAD IMPLANT DT: 20180216
MDC IDC MSMT BATTERY REMAINING LONGEVITY: 127 mo
MDC IDC MSMT BATTERY REMAINING PERCENTAGE: 95.5 %
MDC IDC MSMT LEADCHNL RA IMPEDANCE VALUE: 430 Ohm
MDC IDC MSMT LEADCHNL RA PACING THRESHOLD AMPLITUDE: 0.75 V
MDC IDC MSMT LEADCHNL RV SENSING INTR AMPL: 4.7 mV
MDC IDC PG IMPLANT DT: 20180216
MDC IDC PG SERIAL: 7998131
MDC IDC SESS DTM: 20190819211335
MDC IDC SET LEADCHNL RA PACING AMPLITUDE: 2 V
MDC IDC STAT BRADY AS VP PERCENT: 37 %
MDC IDC STAT BRADY RA PERCENT PACED: 8.9 %

## 2017-11-26 NOTE — Addendum Note (Signed)
Encounter addended by: Noel Christmas, RN on: 11/26/2017 9:56 AM  Actions taken: Sign clinical note

## 2017-11-26 NOTE — Progress Notes (Signed)
Discharge Progress Report  Patient Details  Name: [name] MRN: [id number] Date of Birth: 07/17/1935 Referring Provider:     CARDIAC REHAB PHASE II ORIENTATION from 10/02/2017 in [place]  Referring Provider  Skeet Latch MD       Number of Visits: 13  Reason for Discharge:  Early Exit:  [name] was having increased pain and was unable to tolerate continuing exercise despite seeing her orthopedic MD for this.   Smoking History:  Social History   Tobacco Use  Smoking Status Former Smoker  . Packs/day: 1.00  . Years: 5.00  . Pack years: 5.00  . 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)  ADL [facility]:   Initial Exercise Prescription: Initial Exercise Prescription - 10/02/17 1200      Date of Initial Exercise RX and Referring Provider   Date  10/02/17    Referring Provider  Skeet Latch MD      Recumbant Bike   Level  1    Minutes  10    METs  1      NuStep   Level  1    SPM  50    Minutes  10    METs  1      Arm Ergometer   Level  --    Minutes  --    METs  --      Track   Laps  4    Minutes  10    METs  1.7      Prescription Details   Frequency (times per week)  3    Duration  Progress to 30 minutes of continuous aerobic without signs/symptoms of physical distress      Intensity   THRR 40-80% of Max Heartrate  56-112    Ratings of Perceived Exertion  11-13    Perceived Dyspnea  0-4      Progression   Progression  Continue to progress workloads to maintain intensity without signs/symptoms of physical distress.      Resistance Training   Training Prescription  Yes    Weight  1lb    Reps  10-15       Discharge Exercise Prescription (Final Exercise Prescription Changes): Exercise Prescription Changes - 11/09/17 1414      Response to Exercise   Blood Pressure (Admit)  122/68    Blood Pressure (Exercise)  130/68     Blood Pressure (Exit)  108/72    Heart Rate (Admit)  80 bpm    Heart Rate (Exercise)  102 bpm    Heart Rate (Exit)  80 bpm    Rating of Perceived Exertion (Exercise)  14    Perceived Dyspnea (Exercise)  0    Symptoms  None     Comments  Pt discharged early from program due to chronic pain    Duration  Continue with 30 min of aerobic exercise without signs/symptoms of physical distress.    Intensity  THRR unchanged      Progression   Progression  Continue to progress workloads to maintain intensity without signs/symptoms of physical distress.    Average METs  2      Resistance Training   Training Prescription  Yes    Weight  3lbs    Reps  10-15    Time  10 Minutes      Interval Training   Interval Training  No  NuStep   Level  3    SPM  90    Minutes  20    METs  1.9      Arm Ergometer   Level  1    Minutes  10    METs  1.9      Home Exercise Plan   Plans to continue exercise at  Home (comment)   Chair Exercises, Walking    Frequency  Add 3 additional days to program exercise sessions.    Initial Home Exercises Provided  11/14/17       Functional Capacity: 6 Minute Walk    Row Name 10/02/17 1207         6 Minute Walk   Phase  Initial     Distance  457 feet     Walk Time  4.58 minutes     # of Rest Breaks  3     MPH  1.1     METS  -0.13     RPE  15     VO2 Peak  -0.46     Symptoms  Yes (comment)     Comments  fatigue. 3 short rest breaks for a total of 62 seconds     Resting HR  62 bpm     Resting BP  120/70     Resting Oxygen Saturation   97 %     Exercise Oxygen Saturation  during 6 min walk  99 %     Max Ex. HR  84 bpm     Max Ex. BP  138/80     2 Minute Post BP  120/70        Psychological, QOL, Others - Outcomes: PHQ 2/9: Depression screen PHQ 2/9 10/08/2017  Decreased Interest 0  Down, Depressed, Hopeless 0  PHQ - 2 Score 0    Quality of Life: Quality of Life - 10/08/17 1500      Quality of Life   Select  Quality of Life       Quality of Life Scores   Health/Function Pre  19.11 %    Socioeconomic Pre  26.13 %    Psych/Spiritual Pre  22.29 %    Family Pre  24 %    GLOBAL Pre  22.08 %       Personal Goals: Goals established at orientation with interventions provided to work toward goal. Personal Goals and Risk Factors at Admission - 10/02/17 1216      Core Components/Risk Factors/Patient Goals on Admission    Weight Management  Yes;Obesity;Weight Maintenance    Intervention  Weight Management: Develop a combined nutrition and exercise program designed to reach desired caloric intake, while maintaining appropriate intake of nutrient and fiber, sodium and fats, and appropriate energy expenditure required for the weight goal.;Weight Management: Provide education and appropriate resources to help participant work on and attain dietary goals.;Weight Management/Obesity: Establish reasonable short term and long term weight goals.;Obesity: Provide education and appropriate resources to help participant work on and attain dietary goals.    Admit Weight  275 lb 9.2 oz (125 kg)    Goal Weight: Short Term  270 lb (122.5 kg)    Goal Weight: Long Term  250 lb (113.4 kg)    Expected Outcomes  Short Term: Continue to assess and modify interventions until short term weight is achieved;Long Term: Adherence to nutrition and physical activity/exercise program aimed toward attainment of established weight goal;Weight Maintenance: Understanding of the daily nutrition guidelines, which includes 25-35% calories from  fat, 7% or less cal from saturated fats, less than 200mg  cholesterol, less than 1.5gm of sodium, & 5 or more servings of fruits and vegetables daily;Weight Loss: Understanding of general recommendations for a balanced deficit meal plan, which promotes 1-2 lb weight loss per week and includes a negative energy balance of [phone] kcal/d;Understanding recommendations for meals to include 15-35% energy as protein, 25-35% energy from  fat, 35-60% energy from carbohydrates, less than 200mg  of dietary cholesterol, 20-35 gm of total fiber daily;Understanding of distribution of calorie intake throughout the day with the consumption of 4-5 meals/snacks    Diabetes  Yes    Intervention  Provide education about signs/symptoms and action to take for hypo/hyperglycemia.;Provide education about proper nutrition, including hydration, and aerobic/resistive exercise prescription along with prescribed medications to achieve blood glucose in normal ranges: Fasting glucose 65-99 mg/dL    Expected Outcomes  Short Term: Participant verbalizes understanding of the signs/symptoms and immediate care of hyper/hypoglycemia, proper foot care and importance of medication, aerobic/resistive exercise and nutrition plan for blood glucose control.;Long Term: Attainment of HbA1C < 7%.    Hypertension  Yes    Intervention  Provide education on lifestyle modifcations including regular physical activity/exercise, weight management, moderate sodium restriction and increased consumption of fresh fruit, vegetables, and low fat dairy, alcohol moderation, and smoking cessation.;Monitor prescription use compliance.    Expected Outcomes  Short Term: Continued assessment and intervention until BP is < 140/44mm HG in hypertensive participants. < 130/89mm HG in hypertensive participants with diabetes, heart failure or chronic kidney disease.;Long Term: Maintenance of blood pressure at goal levels.    Lipids  Yes    Intervention  Provide education and support for participant on nutrition & aerobic/resistive exercise along with prescribed medications to achieve LDL 70mg , HDL >40mg .    Expected Outcomes  Short Term: Participant states understanding of desired cholesterol values and is compliant with medications prescribed. Participant is following exercise prescription and nutrition guidelines.;Long Term: Cholesterol controlled with medications as prescribed, with individualized  exercise RX and with personalized nutrition plan. Value goals: LDL < 70mg , HDL > 40 mg.        Personal Goals Discharge: Goals and Risk Factor Review    Row Name 10/10/17 1558 11/08/17 1051           Core Components/Risk Factors/Patient Goals Review   Personal Goals Review  Lipids;Diabetes;Hypertension;Weight Management/Obesity  Lipids;Diabetes;Hypertension;Weight Management/Obesity      Review  Pt with multiple CAD RF. [name] would like to become more active.  Pt with multiple CAD RF. [name] is tolerating exercise well despite having joint pain.  She has great attendance.       Expected Outcomes  Pt will participate in CR exercise, nutrition, and lifestyle modification opportunities.   Pt will participate in CR exercise, nutrition, and lifestyle modification opportunities.          Exercise Goals and Review: Exercise Goals    Row Name 10/02/17 0927             Exercise Goals   Increase Physical Activity  Yes       Intervention  Provide advice, education, support and counseling about physical activity/exercise needs.;Develop an individualized exercise prescription for aerobic and resistive training based on initial evaluation findings, risk stratification, comorbidities and participant's personal goals.       Expected Outcomes  Short Term: Attend rehab on a regular basis to increase amount of physical activity.;Long Term: Exercising regularly at least 3-5 days a week.;Long Term: Add in home  exercise to make exercise part of routine and to increase amount of physical activity.       Increase Strength and Stamina  Yes improve functional mobility and be able to walk without pain       Intervention  Provide advice, education, support and counseling about physical activity/exercise needs.;Develop an individualized exercise prescription for aerobic and resistive training based on initial evaluation findings, risk stratification, comorbidities and participant's personal goals.        Expected Outcomes  Short Term: Increase workloads from initial exercise prescription for resistance, speed, and METs.;Short Term: Perform resistance training exercises routinely during rehab and add in resistance training at home;Long Term: Improve cardiorespiratory fitness, muscular endurance and strength as measured by increased METs and functional capacity (6MWT)       Able to understand and use rate of perceived exertion (RPE) scale  Yes       Intervention  Provide education and explanation on how to use RPE scale       Expected Outcomes  Short Term: Able to use RPE daily in rehab to express subjective intensity level;Long Term:  Able to use RPE to guide intensity level when exercising independently       Knowledge and understanding of Target Heart Rate Range (THRR)  Yes       Intervention  Provide education and explanation of THRR including how the numbers were predicted and where they are located for reference       Expected Outcomes  Short Term: Able to state/look up THRR;Long Term: Able to use THRR to govern intensity when exercising independently;Short Term: Able to use daily as guideline for intensity in rehab       Able to check pulse independently  Yes       Intervention  Provide education and demonstration on how to check pulse in carotid and radial arteries.;Review the importance of being able to check your own pulse for safety during independent exercise       Expected Outcomes  Short Term: Able to explain why pulse checking is important during independent exercise;Long Term: Able to check pulse independently and accurately       Understanding of Exercise Prescription  Yes       Intervention  Provide education, explanation, and written materials on patient's individual exercise prescription       Expected Outcomes  Short Term: Able to explain program exercise prescription;Long Term: Able to explain home exercise prescription to exercise independently          Nutrition & Weight -  Outcomes: Pre Biometrics - 10/02/17 1216      Pre Biometrics   Height  5\' 7"  (1.702 m)    Weight  125 kg    Waist Circumference  52 inches    Hip Circumference  56.5 inches    Waist to Hip Ratio  0.92 %    BMI (Calculated)  43.15    Triceps Skinfold  45 mm    % Body Fat  56.2 %    Grip Strength  30 kg    Flexibility  0 in   LBP!   Single Leg Stand  0 seconds        Nutrition: Nutrition Therapy & Goals - 10/02/17 1214      Nutrition Therapy   Diet  consistent carbohydrate heart healthy      Personal Nutrition Goals   Nutrition Goal  Pt to identify and limit food sources of saturated fat, trans fat, and sodium  Personal Goal #2  Pt to identify food quantities necessary to achieve weight loss of 6-24 lbs. at graduation from cardiac rehab.       Intervention Plan   Intervention  Prescribe, educate and counsel regarding individualized specific dietary modifications aiming towards targeted core components such as weight, hypertension, lipid management, diabetes, heart failure and other comorbidities.    Expected Outcomes  Short Term Goal: Understand basic principles of dietary content, such as calories, fat, sodium, cholesterol and nutrients.       Nutrition Discharge: Nutrition Assessments - 11/16/17 0947      MEDFICTS Scores   Pre Score  35    Post Score  --   pt did not return survey      Education Questionnaire Score: Knowledge Questionnaire Score - 10/08/17 1555      Knowledge Questionnaire Score   Pre Score  21/24       Goals reviewed with patient; copy given to patient.

## 2017-11-28 ENCOUNTER — Encounter (HOSPITAL_COMMUNITY): Payer: Medicare Other

## 2017-11-30 ENCOUNTER — Encounter (HOSPITAL_COMMUNITY): Payer: Medicare Other

## 2017-12-03 ENCOUNTER — Encounter (HOSPITAL_COMMUNITY): Payer: Medicare Other

## 2017-12-05 ENCOUNTER — Encounter (HOSPITAL_COMMUNITY): Payer: Medicare Other

## 2017-12-05 DIAGNOSIS — M1712 Unilateral primary osteoarthritis, left knee: Secondary | ICD-10-CM | POA: Diagnosis not present

## 2017-12-05 DIAGNOSIS — M1711 Unilateral primary osteoarthritis, right knee: Secondary | ICD-10-CM | POA: Diagnosis not present

## 2017-12-07 ENCOUNTER — Encounter (HOSPITAL_COMMUNITY): Payer: Medicare Other

## 2017-12-10 ENCOUNTER — Encounter (HOSPITAL_COMMUNITY): Payer: Medicare Other

## 2017-12-12 ENCOUNTER — Encounter (HOSPITAL_COMMUNITY): Payer: Medicare Other

## 2017-12-12 DIAGNOSIS — M1712 Unilateral primary osteoarthritis, left knee: Secondary | ICD-10-CM | POA: Diagnosis not present

## 2017-12-12 DIAGNOSIS — M1711 Unilateral primary osteoarthritis, right knee: Secondary | ICD-10-CM | POA: Diagnosis not present

## 2017-12-14 ENCOUNTER — Encounter (HOSPITAL_COMMUNITY): Payer: Medicare Other

## 2017-12-17 ENCOUNTER — Encounter (HOSPITAL_COMMUNITY): Payer: Medicare Other

## 2017-12-19 ENCOUNTER — Encounter (HOSPITAL_COMMUNITY): Payer: Medicare Other

## 2017-12-21 ENCOUNTER — Encounter (HOSPITAL_COMMUNITY): Payer: Medicare Other

## 2017-12-24 ENCOUNTER — Encounter (HOSPITAL_COMMUNITY): Payer: Medicare Other

## 2017-12-25 ENCOUNTER — Ambulatory Visit: Payer: Medicare Other | Admitting: Cardiovascular Disease

## 2017-12-26 ENCOUNTER — Encounter (HOSPITAL_COMMUNITY): Payer: Medicare Other

## 2017-12-28 ENCOUNTER — Encounter (HOSPITAL_COMMUNITY): Payer: Medicare Other

## 2017-12-31 ENCOUNTER — Encounter (HOSPITAL_COMMUNITY): Payer: Medicare Other

## 2018-01-02 ENCOUNTER — Encounter (HOSPITAL_COMMUNITY): Payer: Medicare Other

## 2018-01-04 ENCOUNTER — Encounter (HOSPITAL_COMMUNITY): Payer: Medicare Other

## 2018-01-07 ENCOUNTER — Encounter (HOSPITAL_COMMUNITY): Payer: Medicare Other

## 2018-01-09 ENCOUNTER — Encounter (HOSPITAL_COMMUNITY): Payer: Medicare Other

## 2018-01-11 ENCOUNTER — Other Ambulatory Visit: Payer: Self-pay | Admitting: Physician Assistant

## 2018-01-17 DIAGNOSIS — E559 Vitamin D deficiency, unspecified: Secondary | ICD-10-CM | POA: Diagnosis not present

## 2018-01-17 DIAGNOSIS — E7800 Pure hypercholesterolemia, unspecified: Secondary | ICD-10-CM | POA: Diagnosis not present

## 2018-01-17 DIAGNOSIS — N183 Chronic kidney disease, stage 3 (moderate): Secondary | ICD-10-CM | POA: Diagnosis not present

## 2018-01-17 DIAGNOSIS — E1122 Type 2 diabetes mellitus with diabetic chronic kidney disease: Secondary | ICD-10-CM | POA: Diagnosis not present

## 2018-01-17 DIAGNOSIS — F4320 Adjustment disorder, unspecified: Secondary | ICD-10-CM | POA: Diagnosis not present

## 2018-01-17 DIAGNOSIS — E119 Type 2 diabetes mellitus without complications: Secondary | ICD-10-CM | POA: Diagnosis not present

## 2018-01-17 DIAGNOSIS — Z23 Encounter for immunization: Secondary | ICD-10-CM | POA: Diagnosis not present

## 2018-01-17 DIAGNOSIS — I10 Essential (primary) hypertension: Secondary | ICD-10-CM | POA: Diagnosis not present

## 2018-01-17 DIAGNOSIS — E1169 Type 2 diabetes mellitus with other specified complication: Secondary | ICD-10-CM | POA: Diagnosis not present

## 2018-01-21 ENCOUNTER — Ambulatory Visit (INDEPENDENT_AMBULATORY_CARE_PROVIDER_SITE_OTHER): Payer: Medicare Other

## 2018-01-21 DIAGNOSIS — I442 Atrioventricular block, complete: Secondary | ICD-10-CM | POA: Diagnosis not present

## 2018-01-21 NOTE — Progress Notes (Signed)
Remote pacemaker transmission.   

## 2018-01-24 ENCOUNTER — Encounter: Payer: Self-pay | Admitting: Cardiology

## 2018-01-28 DIAGNOSIS — M1712 Unilateral primary osteoarthritis, left knee: Secondary | ICD-10-CM | POA: Diagnosis not present

## 2018-01-28 DIAGNOSIS — E781 Pure hyperglyceridemia: Secondary | ICD-10-CM | POA: Diagnosis not present

## 2018-01-28 DIAGNOSIS — M1711 Unilateral primary osteoarthritis, right knee: Secondary | ICD-10-CM | POA: Diagnosis not present

## 2018-01-29 DIAGNOSIS — Z1231 Encounter for screening mammogram for malignant neoplasm of breast: Secondary | ICD-10-CM | POA: Diagnosis not present

## 2018-01-29 DIAGNOSIS — Z170 Estrogen receptor positive status [ER+]: Secondary | ICD-10-CM | POA: Diagnosis not present

## 2018-01-29 DIAGNOSIS — C50212 Malignant neoplasm of upper-inner quadrant of left female breast: Secondary | ICD-10-CM | POA: Diagnosis not present

## 2018-02-25 DIAGNOSIS — L308 Other specified dermatitis: Secondary | ICD-10-CM | POA: Diagnosis not present

## 2018-02-25 DIAGNOSIS — M160 Bilateral primary osteoarthritis of hip: Secondary | ICD-10-CM | POA: Diagnosis not present

## 2018-02-25 DIAGNOSIS — D225 Melanocytic nevi of trunk: Secondary | ICD-10-CM | POA: Diagnosis not present

## 2018-02-25 DIAGNOSIS — I872 Venous insufficiency (chronic) (peripheral): Secondary | ICD-10-CM | POA: Diagnosis not present

## 2018-02-28 ENCOUNTER — Other Ambulatory Visit: Payer: Self-pay | Admitting: Orthopaedic Surgery

## 2018-02-28 DIAGNOSIS — M1611 Unilateral primary osteoarthritis, right hip: Secondary | ICD-10-CM

## 2018-03-11 ENCOUNTER — Ambulatory Visit
Admission: RE | Admit: 2018-03-11 | Discharge: 2018-03-11 | Disposition: A | Discharge status: Home / Self Care | Payer: Medicare Other | Source: Ambulatory Visit | Attending: Orthopaedic Surgery | Admitting: Orthopaedic Surgery

## 2018-03-11 DIAGNOSIS — M1611 Unilateral primary osteoarthritis, right hip: Secondary | ICD-10-CM

## 2018-03-11 MED ORDER — METHYLPREDNISOLONE ACETATE 40 MG/ML INJ SUSP (RADIOLOG
120.0000 mg | Freq: Once | INTRAMUSCULAR | Status: AC
  Administered 2018-03-11: 120 mg via INTRA_ARTICULAR

## 2018-03-11 MED ORDER — IOPAMIDOL (ISOVUE-M 200) INJECTION 41%
1.0000 mL | Freq: Once | INTRAMUSCULAR | Status: AC
  Administered 2018-03-11: 1 mL via INTRA_ARTICULAR

## 2018-03-19 ENCOUNTER — Encounter: Payer: Self-pay | Admitting: Cardiovascular Disease

## 2018-03-19 ENCOUNTER — Ambulatory Visit (INDEPENDENT_AMBULATORY_CARE_PROVIDER_SITE_OTHER): Payer: Medicare Other | Admitting: Cardiovascular Disease

## 2018-03-19 VITALS — BP 122/66 | HR 62 | Ht 67.0 in | Wt 271.8 lb

## 2018-03-19 DIAGNOSIS — Z952 Presence of prosthetic heart valve: Secondary | ICD-10-CM | POA: Diagnosis not present

## 2018-03-19 DIAGNOSIS — I442 Atrioventricular block, complete: Secondary | ICD-10-CM

## 2018-03-19 DIAGNOSIS — I10 Essential (primary) hypertension: Secondary | ICD-10-CM | POA: Diagnosis not present

## 2018-03-19 DIAGNOSIS — R0602 Shortness of breath: Secondary | ICD-10-CM

## 2018-03-19 DIAGNOSIS — I342 Nonrheumatic mitral (valve) stenosis: Secondary | ICD-10-CM | POA: Diagnosis not present

## 2018-03-19 LAB — CUP PACEART REMOTE DEVICE CHECK
Battery Remaining Longevity: 123 mo
Battery Remaining Percentage: 95.5 %
Battery Voltage: 3.01 V
Brady Statistic AP VP Percent: 3.4 %
Brady Statistic AP VS Percent: 4.1 %
Brady Statistic AS VP Percent: 57 %
Brady Statistic AS VS Percent: 35 %
Brady Statistic RA Percent Paced: 6.3 %
Brady Statistic RV Percent Paced: 60 %
Date Time Interrogation Session: 20191118070015
Implantable Lead Implant Date: 20180216
Implantable Lead Implant Date: 20180216
Implantable Lead Location: 753859
Implantable Lead Location: 753860
Implantable Pulse Generator Implant Date: 20180216
Lead Channel Impedance Value: 440 Ohm
Lead Channel Impedance Value: 450 Ohm
Lead Channel Pacing Threshold Amplitude: 0.75 V
Lead Channel Pacing Threshold Amplitude: 1 V
Lead Channel Pacing Threshold Pulse Width: 0.5 ms
Lead Channel Pacing Threshold Pulse Width: 0.5 ms
Lead Channel Sensing Intrinsic Amplitude: 2.8 mV
Lead Channel Sensing Intrinsic Amplitude: 3.3 mV
Lead Channel Setting Pacing Amplitude: 1.25 V
Lead Channel Setting Pacing Amplitude: 2 V
Lead Channel Setting Pacing Pulse Width: 0.5 ms
Lead Channel Setting Sensing Sensitivity: 2.5 mV
Pulse Gen Model: 2272
Pulse Gen Serial Number: 7998131

## 2018-03-19 NOTE — Patient Instructions (Signed)
Medication Instructions:  Your physician recommends that you continue on your current medications as directed. Please refer to the Current Medication list given to you today.  If you need a refill on your cardiac medications before your next appointment, please call your pharmacy.   Lab work: NONE  Testing/Procedures: NONE   Follow-Up: At [facility], you and your health needs are our priority.  As part of our continuing mission to provide you with exceptional heart care, we have created designated Provider Care Teams.  These Care Teams include your primary Cardiologist (physician) and Advanced Practice Providers (APPs -  Physician Assistants and Nurse Practitioners) who all work together to provide you with the care you need, when you need it. You will need a follow up appointment in 12 months.  Please call our office 2 months in advance to schedule this appointment.  You may see DR [place]  or one of the following Advanced Practice Providers on your designated Care Team:   Luke Kilroy, PA-C Krista Kroeger, PA-C . Callie Goodrich, PA-C    

## 2018-03-19 NOTE — Progress Notes (Signed)
Cardiology Office Note   Date:  03/19/2018   ID:  [name], DOB [birth year], MRN [id number]  PCP:  Leighton Ruff, MD  Cardiologist:   Skeet Latch, MD  Electrophysiologist: Allegra Lai, MD  No chief complaint on file.    History of Present Illness: [name] is a 83 y.o. female with hypertension, diabetes, prior DVT/PE, severe aortic stenosis s/p TAVR, complete heart block status post pacemaker, breast cancer status post left lumpectomy, and morbid obesity who presents for follow-up.  [name] was admitted to the hospital 04/2016 with symptomatic bradycardia. She was noted to be in complete heart block with a ventricular rate in the 30s. Dr. Curt Bears implanted a St. Jude dual-chamber pacemaker on 04/21/16.  She had an echocardiogram 04/21/16 that revealed LVEF 60-65% with grade 1 diastolic dysfunction. She also had moderate aortic stenosis with a mean gradient of 33 mmHg. She was noted to have moderate mitral stenosis due to mitral annular calcification as well. The mean gradient 7 mmHg with a valve area by pressure half-time of 1.86 cm.  BNP was 612.    She had a repeat echocardiogram 05/2017 that revealed LVEF 55-60% and grade 1 diastolic dysfunction.  She was found to have critical aortic stenosis with a mean gradient of 62 mmHg.  She was referred to Dr. Burt Knack and underwent implantation of a 51mm Edwards Sapien 3 TAVR bioprosthesis 07/17/17.  Postoperative echo revealed normal systolic function and a mean valve gradient of 15 mmHg.  She required diuresis after the operation but was otherwise uneventful.  [name] has been doing well from a cardiac standpoint.  Her husband passed away February 06, 2018 and she has been feeling depressed.  She also struggles with arthritis in her hip and knees.  This makes it hard for her to exercise.  She has LE edema but is unable to elevated her legs due to hip pain.  She denies chest pain, orthopnea or pND.  She has exertional dyspnea that is  unchanged.  She attributes this to not exercising and weight.  She has neuropathy that makes it hard for her to drive.  She is unable to tell when she is pressing down on the break.     Past Medical History:  Diagnosis Date  . Anxiety   . Aortic stenosis   . Arthritis   . Breast cancer [facility])    CANCER  . Diabetes mellitus    Type II  . Dyspnea   . Heart murmur   . History of chemotherapy   . History of kidney stones   . Hypertension   . Mitral stenosis   . Morbid obesity (HCC)    BMI > 40  . Ovarian cancer ([place])   . Presence of permanent cardiac pacemaker    St. Jude  . Pulmonary embolism ([place]) 2013   post op  . 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 Spinal stenosis     Past Surgical History:  Procedure Laterality Date  . ABDOMINAL HYSTERECTOMY  2013  . BREAST SURGERY  03-2004   lumpectomy   . CARDIAC CATHETERIZATION    . INSERT / REPLACE / REMOVE PACEMAKER    . INTRAOPERATIVE TRANSTHORACIC ECHOCARDIOGRAM  07/17/2017   Procedure: INTRAOPERATIVE TRANSTHORACIC ECHOCARDIOGRAM;  Surgeon: Sherren Mocha, MD;  Location: [place];  Service: Open Heart Surgery;;  . LITHOTRIPSY    . PACEMAKER IMPLANT N/A 04/21/2016  Procedure: Pacemaker Implant;  Surgeon: Will Meredith Leeds, MD;  Location: [place] CV LAB;  Service: Cardiovascular;  Laterality: N/A;  . TONSILLECTOMY    . TRANSCATHETER AORTIC VALVE REPLACEMENT, TRANSFEMORAL N/A 07/17/2017   Procedure: TRANSCATHETER AORTIC VALVE REPLACEMENT, TRANSFEMORAL;  Surgeon: Sherren Mocha, MD;  Location: [place];  Service: Open Heart Surgery;  Laterality: N/A;     Current Outpatient Medications  Medication Sig Dispense Refill  . acetaminophen (TYLENOL) 650 MG CR tablet Take 1,300 mg by mouth every 8 (eight) hours as needed for pain.    [name] Kitchen amoxicillin (AMOXIL) 500 MG tablet Take 4 tablets (2,000 mg) one hour prior  to dental visits. 8 tablet 6  . aspirin 81 MG tablet Take 81 mg by mouth daily.      [name] Kitchen atorvastatin (LIPITOR) 20 MG tablet Take 20 mg by mouth at bedtime.     . Calcium Citrate-Vitamin D (CALCIUM CITRATE + PO) Take 1 tablet by mouth 2 (two) times daily.     . cholecalciferol (VITAMIN D) 1000 units tablet Take 1,000 Units by mouth 2 (two) times daily.     . fish oil-omega-3 fatty acids 1000 MG capsule Take 1 g by mouth every evening.     [name] Kitchen FLUoxetine (PROZAC) 40 MG capsule Take 40 mg by mouth daily.     . furosemide (LASIX) 40 MG tablet TAKE 1 TABLET BY MOUTH TWICE DAILY 180 tablet 2  . LEVEMIR FLEXTOUCH 100 UNIT/ML Pen Inject 60-70 Units into the skin 2 (two) times daily. Per sliding scale    . losartan (COZAAR) 50 MG tablet Take 50 mg by mouth daily.    . Magnesium 400 MG CAPS Take 400 mg by mouth at bedtime.     . metFORMIN (GLUCOPHAGE) 1000 MG tablet Take 1,000 mg by mouth 2 (two) times daily with a meal.     . metoprolol tartrate (LOPRESSOR) 25 MG tablet Take 12.5 mg by mouth 2 (two) times daily.     . Multiple Vitamin (MULTIVITAMIN) tablet Take 1 tablet by mouth daily.       No current facility-administered medications for this visit.     Allergies:   Tape    Social History:  The patient  reports that she has quit smoking. Her smoking use included cigarettes. She has a 5.00 pack-year smoking history. She has never used smokeless tobacco. She reports that she does not drink alcohol or use drugs.   Family History:  The patient's family history includes Cancer in her brother, father, and sister.    ROS:  Please see the history of present illness.   Otherwise, review of systems are positive for none.   All other systems are reviewed and negative.    PHYSICAL EXAM: VS:  BP 122/66   Pulse 62   Ht 5\' 7"  (1.702 m)   Wt 271 lb 12.8 oz (123.3 kg)   BMI 42.57 kg/m  , BMI Body mass index is 42.57 kg/m. GENERAL:  Well appearing HEENT: Pupils equal round and reactive, fundi not  visualized, oral mucosa unremarkable NECK:  No jugular venous distention, waveform within normal limits, carotid upstroke brisk and symmetric, no bruits LUNGS:  Clear to auscultation bilaterally HEART:  RRR.  PMI not displaced or sustained,S1 and S2 within normal limits, no S3, no S4, no clicks, no rubs, II/VI systolic murmur at the LUSB ABD:  Flat, positive bowel sounds normal in frequency in pitch, no bruits, no rebound, no guarding, no midline pulsatile mass, no hepatomegaly, no splenomegaly EXT:  2 plus pulses throughout, no edema, no cyanosis no clubbing SKIN:  No rashes no nodules NEURO:  Cranial nerves II through XII grossly intact, motor grossly intact throughout PSYCH:  Cognitively intact, oriented to person place and time   EKG:  EKG is ordered today. 03/19/18: Sinus rhythm.  VP.  Rate 62 bpm.    Echo 04/21/16: Study Conclusions  - Left ventricle: The cavity size was normal. Wall thickness was   increased in a pattern of severe LVH. Systolic function was   normal. The estimated ejection fraction was in the range of 60%   to 65%. Wall motion was normal; there were no regional wall   motion abnormalities. Doppler parameters are consistent with   abnormal left ventricular relaxation (grade 1 diastolic   dysfunction). - Aortic valve: Moderately to severely calcified annulus. Severely   thickened, severely calcified leaflets. There was moderate to   severe stenosis. There was trivial regurgitation. - Mitral valve: Severely calcified annulus. Severely calcified   leaflets . The findings are consistent with moderate stenosis.   There was mild to moderate regurgitation. Valve area by pressure   half-time: 1.86 cm^2. - Left atrium: The atrium was mildly dilated.  Echo 05/14/17: Study Conclusions  - Left ventricle: The cavity size was normal. There was moderate   concentric hypertrophy. Systolic function was normal. The   estimated ejection fraction was in the range of 55% to  60%. Wall   motion was normal; there were no regional wall motion   abnormalities. Doppler parameters are consistent with abnormal   left ventricular relaxation (grade 1 diastolic dysfunction).   Doppler parameters are consistent with high ventricular filling   pressure. - Aortic valve: Probably trileaflet; severely thickened, severely   calcified leaflets. Valve mobility was restricted. There was   critical stenosis. There was no regurgitation. Peak velocity (S):   478 cm/s. Mean gradient (S): 62 mm Hg. Valve area (VTI): 0.57   cm^2. Valve area (Vmax): 0.65 cm^2. Valve area (Vmean): 0.51   cm^2. - Mitral valve: Severely calcified annulus. Mobility was   restricted. The findings are consistent with mild stenosis. There   was mild regurgitation. Valve area by pressure half-time: 1.93   cm^2. Valve area by continuity equation (using LVOT flow): 1.43   cm^2. - Left atrium: The atrium was severely dilated. - Right ventricle: The cavity size was normal. Wall thickness was   normal. Systolic function was normal. - Tricuspid valve: There was mild regurgitation. - Pulmonary arteries: Systolic pressure was mildly increased. PA   peak pressure: 44 mm Hg (S).  Recent Labs: 07/18/2017: Hemoglobin 10.6; Magnesium 1.7; Platelets 130 11/01/2017: ALT 18; BNP 62.1; BUN 20; Creatinine, Ser 0.80; Potassium 4.4; Sodium 146     Lipid Panel    Component Value Date/Time   CHOL 126 11/01/2017 1213   TRIG 127 11/01/2017 1213   HDL 58 11/01/2017 1213   CHOLHDL 2.2 11/01/2017 1213   LDLCALC 43 11/01/2017 1213      Wt Readings from Last 3 Encounters:  03/19/18 271 lb 12.8 oz (123.3 kg)  10/30/17 272 lb (123.4 kg)  10/10/17 278 lb 3.5 oz (126.2 kg)      ASSESSMENT AND PLAN:  # Complete heart block s/p PPM: [name] is doing well.  Managed by Dr. Curt Bears.   # Severe aortic stenosis:   # S/p TAVR: [name] is doing well post-TAVR.  Mean gradient 16 mmHg on 08/2017.    # Acute on chronic  diastolic heart failure:  #  Moderate mitral stenosis: # Hypertension:  Volume status is stable.  BP is well-controlled. Continue metoprolol, losartan and lasix.  Repeat echo on follow up.  # Hyperlipidemia:  Continue atorvastatin.  LDL was 43 on 10/2017.    Current medicines are reviewed at length with the patient today.  The patient does not have concerns regarding medicines.   The following changes have been made:  no change  Labs/ tests ordered today include:   No orders of the defined types were placed in this encounter.    Disposition:   FU with Damilola Flamm C. Oval Linsey, MD, [facility] in 1 year.    Signed, Harlow Basley C. Oval Linsey, MD, [facility]  03/19/2018 5:03 PM    [place]

## 2018-03-22 ENCOUNTER — Emergency Department (HOSPITAL_COMMUNITY): Payer: Medicare Other | Admitting: Anesthesiology

## 2018-03-22 ENCOUNTER — Encounter (HOSPITAL_COMMUNITY): Admission: EM | Disposition: A | Discharge status: Home Health | Payer: Self-pay | Source: Home / Self Care

## 2018-03-22 ENCOUNTER — Encounter (HOSPITAL_BASED_OUTPATIENT_CLINIC_OR_DEPARTMENT_OTHER): Payer: Self-pay | Admitting: *Deleted

## 2018-03-22 ENCOUNTER — Emergency Department (HOSPITAL_BASED_OUTPATIENT_CLINIC_OR_DEPARTMENT_OTHER): Payer: Medicare Other

## 2018-03-22 ENCOUNTER — Inpatient Hospital Stay (HOSPITAL_BASED_OUTPATIENT_CLINIC_OR_DEPARTMENT_OTHER)
Admission: EM | Admit: 2018-03-22 | Discharge: 2018-03-28 | DRG: 330 | Disposition: A | Discharge status: Home Health | Payer: Medicare Other | Attending: General Surgery | Admitting: General Surgery

## 2018-03-22 ENCOUNTER — Other Ambulatory Visit: Payer: Self-pay

## 2018-03-22 DIAGNOSIS — Z8052 Family history of malignant neoplasm of bladder: Secondary | ICD-10-CM

## 2018-03-22 DIAGNOSIS — E872 Acidosis: Secondary | ICD-10-CM | POA: Diagnosis present

## 2018-03-22 DIAGNOSIS — Z953 Presence of xenogenic heart valve: Secondary | ICD-10-CM

## 2018-03-22 DIAGNOSIS — Z9221 Personal history of antineoplastic chemotherapy: Secondary | ICD-10-CM | POA: Diagnosis not present

## 2018-03-22 DIAGNOSIS — Z6841 Body Mass Index (BMI) 40.0 and over, adult: Secondary | ICD-10-CM

## 2018-03-22 DIAGNOSIS — M4800 Spinal stenosis, site unspecified: Secondary | ICD-10-CM | POA: Diagnosis not present

## 2018-03-22 DIAGNOSIS — Z853 Personal history of malignant neoplasm of breast: Secondary | ICD-10-CM

## 2018-03-22 DIAGNOSIS — Z91048 Other nonmedicinal substance allergy status: Secondary | ICD-10-CM | POA: Diagnosis not present

## 2018-03-22 DIAGNOSIS — Z86711 Personal history of pulmonary embolism: Secondary | ICD-10-CM | POA: Diagnosis not present

## 2018-03-22 DIAGNOSIS — J986 Disorders of diaphragm: Secondary | ICD-10-CM | POA: Diagnosis not present

## 2018-03-22 DIAGNOSIS — Z7984 Long term (current) use of oral hypoglycemic drugs: Secondary | ICD-10-CM

## 2018-03-22 DIAGNOSIS — Z8543 Personal history of malignant neoplasm of ovary: Secondary | ICD-10-CM

## 2018-03-22 DIAGNOSIS — Z950 Presence of cardiac pacemaker: Secondary | ICD-10-CM | POA: Diagnosis not present

## 2018-03-22 DIAGNOSIS — R198 Other specified symptoms and signs involving the digestive system and abdomen: Secondary | ICD-10-CM

## 2018-03-22 DIAGNOSIS — E785 Hyperlipidemia, unspecified: Secondary | ICD-10-CM | POA: Diagnosis not present

## 2018-03-22 DIAGNOSIS — Z809 Family history of malignant neoplasm, unspecified: Secondary | ICD-10-CM

## 2018-03-22 DIAGNOSIS — F419 Anxiety disorder, unspecified: Secondary | ICD-10-CM | POA: Diagnosis present

## 2018-03-22 DIAGNOSIS — K261 Acute duodenal ulcer with perforation: Secondary | ICD-10-CM | POA: Diagnosis not present

## 2018-03-22 DIAGNOSIS — E119 Type 2 diabetes mellitus without complications: Secondary | ICD-10-CM | POA: Diagnosis present

## 2018-03-22 DIAGNOSIS — K660 Peritoneal adhesions (postprocedural) (postinfection): Secondary | ICD-10-CM | POA: Diagnosis present

## 2018-03-22 DIAGNOSIS — Z7982 Long term (current) use of aspirin: Secondary | ICD-10-CM

## 2018-03-22 DIAGNOSIS — Z87442 Personal history of urinary calculi: Secondary | ICD-10-CM

## 2018-03-22 DIAGNOSIS — I080 Rheumatic disorders of both mitral and aortic valves: Secondary | ICD-10-CM | POA: Diagnosis not present

## 2018-03-22 DIAGNOSIS — Z90710 Acquired absence of both cervix and uterus: Secondary | ICD-10-CM

## 2018-03-22 DIAGNOSIS — R1013 Epigastric pain: Secondary | ICD-10-CM | POA: Diagnosis not present

## 2018-03-22 DIAGNOSIS — I10 Essential (primary) hypertension: Secondary | ICD-10-CM | POA: Diagnosis not present

## 2018-03-22 DIAGNOSIS — N200 Calculus of kidney: Secondary | ICD-10-CM | POA: Diagnosis not present

## 2018-03-22 DIAGNOSIS — K8020 Calculus of gallbladder without cholecystitis without obstruction: Secondary | ICD-10-CM | POA: Diagnosis not present

## 2018-03-22 DIAGNOSIS — Z7901 Long term (current) use of anticoagulants: Secondary | ICD-10-CM

## 2018-03-22 DIAGNOSIS — R509 Fever, unspecified: Secondary | ICD-10-CM | POA: Diagnosis not present

## 2018-03-22 DIAGNOSIS — K631 Perforation of intestine (nontraumatic): Secondary | ICD-10-CM | POA: Diagnosis not present

## 2018-03-22 DIAGNOSIS — K265 Chronic or unspecified duodenal ulcer with perforation: Secondary | ICD-10-CM | POA: Diagnosis not present

## 2018-03-22 DIAGNOSIS — M1990 Unspecified osteoarthritis, unspecified site: Secondary | ICD-10-CM | POA: Diagnosis present

## 2018-03-22 DIAGNOSIS — F329 Major depressive disorder, single episode, unspecified: Secondary | ICD-10-CM | POA: Diagnosis present

## 2018-03-22 DIAGNOSIS — R110 Nausea: Secondary | ICD-10-CM | POA: Diagnosis not present

## 2018-03-22 DIAGNOSIS — Z87891 Personal history of nicotine dependence: Secondary | ICD-10-CM | POA: Diagnosis not present

## 2018-03-22 DIAGNOSIS — R1010 Upper abdominal pain, unspecified: Secondary | ICD-10-CM | POA: Diagnosis not present

## 2018-03-22 DIAGNOSIS — Z9889 Other specified postprocedural states: Secondary | ICD-10-CM

## 2018-03-22 HISTORY — PX: LAPAROTOMY: SHX154

## 2018-03-22 LAB — COMPREHENSIVE METABOLIC PANEL
ALT: 15 U/L (ref 0–44)
AST: 20 U/L (ref 15–41)
Albumin: 3.6 g/dL (ref 3.5–5.0)
Alkaline Phosphatase: 55 U/L (ref 38–126)
Anion gap: 10 (ref 5–15)
BUN: 36 mg/dL — AB (ref 8–23)
CHLORIDE: 102 mmol/L (ref 98–111)
CO2: 23 mmol/L (ref 22–32)
Calcium: 9.2 mg/dL (ref 8.9–10.3)
Creatinine, Ser: 1.31 mg/dL — ABNORMAL HIGH (ref 0.44–1.00)
GFR calc Af Amer: 44 mL/min — ABNORMAL LOW (ref >60)
GFR, EST NON AFRICAN AMERICAN: 38 mL/min — AB (ref >60)
Glucose, Bld: 161 mg/dL — ABNORMAL HIGH (ref 70–99)
POTASSIUM: 4.7 mmol/L (ref 3.5–5.1)
SODIUM: 135 mmol/L (ref 135–145)
Total Bilirubin: 1.1 mg/dL (ref 0.3–1.2)
Total Protein: 7.4 g/dL (ref 6.5–8.1)

## 2018-03-22 LAB — URINALYSIS, ROUTINE W REFLEX MICROSCOPIC
Bilirubin Urine: NEGATIVE
Glucose, UA: NEGATIVE mg/dL
Ketones, ur: NEGATIVE mg/dL
NITRITE: NEGATIVE
Protein, ur: 30 mg/dL — AB
SPECIFIC GRAVITY, URINE: 1.01 (ref 1.005–1.030)
pH: 7 (ref 5.0–8.0)

## 2018-03-22 LAB — CBC WITH DIFFERENTIAL/PLATELET
Abs Immature Granulocytes: 0.11 10*3/uL — ABNORMAL HIGH (ref 0.00–0.07)
BASOS ABS: 0 10*3/uL (ref 0.0–0.1)
BASOS PCT: 0 %
EOS ABS: 0 10*3/uL (ref 0.0–0.5)
Eosinophils Relative: 0 %
HCT: 40.3 % (ref 36.0–46.0)
Hemoglobin: 12.8 g/dL (ref 12.0–15.0)
IMMATURE GRANULOCYTES: 1 %
Lymphocytes Relative: 9 %
Lymphs Abs: 1.6 10*3/uL (ref 0.7–4.0)
MCH: 29.5 pg (ref 26.0–34.0)
MCHC: 31.8 g/dL (ref 30.0–36.0)
MCV: 92.9 fL (ref 80.0–100.0)
MONOS PCT: 9 %
Monocytes Absolute: 1.7 10*3/uL — ABNORMAL HIGH (ref 0.1–1.0)
NEUTROS PCT: 81 %
NRBC: 0 % (ref 0.0–0.2)
Neutro Abs: 15.1 10*3/uL — ABNORMAL HIGH (ref 1.7–7.7)
PLATELETS: 218 10*3/uL (ref 150–400)
RBC: 4.34 MIL/uL (ref 3.87–5.11)
RDW: 14.8 % (ref 11.5–15.5)
WBC: 18.6 10*3/uL — AB (ref 4.0–10.5)

## 2018-03-22 LAB — TROPONIN I: Troponin I: <0.03 ng/mL (ref <0.03)

## 2018-03-22 LAB — URINALYSIS, MICROSCOPIC (REFLEX)

## 2018-03-22 LAB — I-STAT CG4 LACTIC ACID, ED: Lactic Acid, Venous: 3.83 mmol/L (ref 0.5–1.9)

## 2018-03-22 SURGERY — LAPAROTOMY, EXPLORATORY
Anesthesia: General | Site: Abdomen

## 2018-03-22 MED ORDER — CEFOTETAN DISODIUM-DEXTROSE 2-2.08 GM-%(50ML) IV SOLR
2.0000 g | INTRAVENOUS | Status: AC
  Filled 2018-03-22 (×2): qty 50

## 2018-03-22 MED ORDER — PHENYLEPHRINE 40 MCG/ML (10ML) SYRINGE FOR IV PUSH (FOR BLOOD PRESSURE SUPPORT)
PREFILLED_SYRINGE | INTRAVENOUS | Status: AC
  Filled 2018-03-22: qty 10

## 2018-03-22 MED ORDER — FENTANYL CITRATE (PF) 100 MCG/2ML IJ SOLN
50.0000 ug | Freq: Once | INTRAMUSCULAR | Status: AC
  Administered 2018-03-22: 50 ug via INTRAVENOUS

## 2018-03-22 MED ORDER — FENTANYL CITRATE (PF) 250 MCG/5ML IJ SOLN
INTRAMUSCULAR | Status: AC
  Filled 2018-03-22: qty 5

## 2018-03-22 MED ORDER — ARTIFICIAL TEARS OPHTHALMIC OINT
TOPICAL_OINTMENT | OPHTHALMIC | Status: AC
  Filled 2018-03-22: qty 3.5

## 2018-03-22 MED ORDER — ROCURONIUM BROMIDE 100 MG/10ML IV SOLN
INTRAVENOUS | Status: DC | PRN
  Administered 2018-03-22: 50 mg via INTRAVENOUS

## 2018-03-22 MED ORDER — ONDANSETRON HCL 4 MG/2ML IJ SOLN
INTRAMUSCULAR | Status: DC | PRN
  Administered 2018-03-22: 4 mg via INTRAVENOUS

## 2018-03-22 MED ORDER — ROCURONIUM BROMIDE 50 MG/5ML IV SOSY
PREFILLED_SYRINGE | INTRAVENOUS | Status: AC
  Filled 2018-03-22: qty 5

## 2018-03-22 MED ORDER — PIPERACILLIN-TAZOBACTAM 3.375 G IVPB 30 MIN
3.3750 g | Freq: Once | INTRAVENOUS | Status: AC
  Administered 2018-03-22: 3.375 g via INTRAVENOUS
  Filled 2018-03-22 (×2): qty 50

## 2018-03-22 MED ORDER — SODIUM CHLORIDE 0.9 % IV BOLUS
500.0000 mL | Freq: Once | INTRAVENOUS | Status: AC
  Administered 2018-03-22: 500 mL via INTRAVENOUS

## 2018-03-22 MED ORDER — FENTANYL CITRATE (PF) 250 MCG/5ML IJ SOLN
INTRAMUSCULAR | Status: DC | PRN
  Administered 2018-03-22 (×2): 50 ug via INTRAVENOUS
  Administered 2018-03-22: 100 ug via INTRAVENOUS
  Administered 2018-03-22: 50 ug via INTRAVENOUS

## 2018-03-22 MED ORDER — FENTANYL CITRATE (PF) 100 MCG/2ML IJ SOLN
INTRAMUSCULAR | Status: AC
  Filled 2018-03-22: qty 2

## 2018-03-22 MED ORDER — PHENYLEPHRINE HCL 10 MG/ML IJ SOLN
INTRAMUSCULAR | Status: AC
  Filled 2018-03-22: qty 1

## 2018-03-22 MED ORDER — ONDANSETRON HCL 4 MG/2ML IJ SOLN
INTRAMUSCULAR | Status: AC
  Filled 2018-03-22: qty 2

## 2018-03-22 MED ORDER — 0.9 % SODIUM CHLORIDE (POUR BTL) OPTIME
TOPICAL | Status: DC | PRN
  Administered 2018-03-22: 1000 mL

## 2018-03-22 MED ORDER — LACTATED RINGERS IV SOLN
INTRAVENOUS | Status: DC | PRN
  Administered 2018-03-22 (×2): via INTRAVENOUS

## 2018-03-22 MED ORDER — SUCCINYLCHOLINE CHLORIDE 20 MG/ML IJ SOLN
INTRAMUSCULAR | Status: DC | PRN
  Administered 2018-03-22: 120 mg via INTRAVENOUS

## 2018-03-22 MED ORDER — EPHEDRINE 5 MG/ML INJ
INTRAVENOUS | Status: AC
  Filled 2018-03-22: qty 10

## 2018-03-22 MED ORDER — SUCCINYLCHOLINE CHLORIDE 200 MG/10ML IV SOSY
PREFILLED_SYRINGE | INTRAVENOUS | Status: AC
  Filled 2018-03-22: qty 10

## 2018-03-22 MED ORDER — SODIUM CHLORIDE 0.9 % IV BOLUS
1000.0000 mL | Freq: Once | INTRAVENOUS | Status: AC
  Administered 2018-03-22: 1000 mL via INTRAVENOUS

## 2018-03-22 MED ORDER — PROPOFOL 10 MG/ML IV BOLUS
INTRAVENOUS | Status: AC
  Filled 2018-03-22: qty 20

## 2018-03-22 MED ORDER — LIDOCAINE 2% (20 MG/ML) 5 ML SYRINGE
INTRAMUSCULAR | Status: AC
  Filled 2018-03-22: qty 5

## 2018-03-22 MED ORDER — SODIUM CHLORIDE 0.9 % IV SOLN
INTRAVENOUS | Status: DC | PRN
  Administered 2018-03-22: 40 ug/min via INTRAVENOUS

## 2018-03-22 MED ORDER — LIDOCAINE HCL (CARDIAC) PF 100 MG/5ML IV SOSY
PREFILLED_SYRINGE | INTRAVENOUS | Status: DC | PRN
  Administered 2018-03-22: 100 mg via INTRATRACHEAL

## 2018-03-22 MED ORDER — FENTANYL CITRATE (PF) 100 MCG/2ML IJ SOLN
100.0000 ug | Freq: Once | INTRAMUSCULAR | Status: AC
  Administered 2018-03-22: 100 ug via INTRAVENOUS
  Filled 2018-03-22: qty 2

## 2018-03-22 MED ORDER — SODIUM CHLORIDE (PF) 0.9 % IJ SOLN
INTRAMUSCULAR | Status: AC
  Filled 2018-03-22: qty 10

## 2018-03-22 MED ORDER — PROPOFOL 10 MG/ML IV BOLUS
INTRAVENOUS | Status: DC | PRN
  Administered 2018-03-22: 120 mg via INTRAVENOUS

## 2018-03-22 MED ORDER — SUGAMMADEX SODIUM 500 MG/5ML IV SOLN
INTRAVENOUS | Status: AC
  Filled 2018-03-22: qty 5

## 2018-03-22 MED ORDER — SODIUM CHLORIDE 0.9 % IV SOLN: Freq: Once | INTRAVENOUS | Status: DC

## 2018-03-22 MED ORDER — IOPAMIDOL (ISOVUE-300) INJECTION 61%
100.0000 mL | Freq: Once | INTRAVENOUS | Status: AC | PRN
  Administered 2018-03-22: 80 mL via INTRAVENOUS

## 2018-03-22 MED ORDER — PHENYLEPHRINE HCL 10 MG/ML IJ SOLN
INTRAMUSCULAR | Status: DC | PRN
  Administered 2018-03-22 (×2): 40 ug via INTRAVENOUS

## 2018-03-22 SURGICAL SUPPLY — 39 items
CHLORAPREP W/TINT 26ML (MISCELLANEOUS) ×3 IMPLANT
CLIP VESOCCLUDE LG 6/CT (CLIP) ×3 IMPLANT
CLIP VESOCCLUDE MED 6/CT (CLIP) ×3 IMPLANT
CLIP VESOCCLUDE SM WIDE 6/CT (CLIP) ×3 IMPLANT
COVER SURGICAL LIGHT HANDLE (MISCELLANEOUS) ×3 IMPLANT
COVER WAND RF STERILE (DRAPES) ×3 IMPLANT
DRAPE LAPAROSCOPIC ABDOMINAL (DRAPES) ×3 IMPLANT
DRAPE WARM FLUID 44X44 (DRAPE) ×3 IMPLANT
DRSG OPSITE POSTOP 4X10 (GAUZE/BANDAGES/DRESSINGS) IMPLANT
DRSG OPSITE POSTOP 4X8 (GAUZE/BANDAGES/DRESSINGS) IMPLANT
ELECT BLADE 6.5 EXT (BLADE) IMPLANT
ELECT CAUTERY BLADE 6.4 (BLADE) ×3 IMPLANT
ELECT REM PT RETURN 9FT ADLT (ELECTROSURGICAL) ×3
ELECTRODE REM PT RTRN 9FT ADLT (ELECTROSURGICAL) ×1 IMPLANT
GLOVE BIOGEL PI IND STRL 7.0 (GLOVE) ×1 IMPLANT
GLOVE BIOGEL PI INDICATOR 7.0 (GLOVE) ×2
GLOVE SURG SS PI 7.0 STRL IVOR (GLOVE) ×3 IMPLANT
GOWN STRL REUS W/ TWL LRG LVL3 (GOWN DISPOSABLE) ×2 IMPLANT
GOWN STRL REUS W/TWL LRG LVL3 (GOWN DISPOSABLE) ×6
KIT BASIN OR (CUSTOM PROCEDURE TRAY) ×3 IMPLANT
LIGASURE IMPACT 36 18CM CVD LR (INSTRUMENTS) IMPLANT
LOOP VESSEL MAXI BLUE (MISCELLANEOUS) IMPLANT
LOOP VESSEL MINI RED (MISCELLANEOUS) IMPLANT
NEEDLE 22X1 1/2 (OR ONLY) (NEEDLE) ×3 IMPLANT
PACK GENERAL/GYN (CUSTOM PROCEDURE TRAY) ×3 IMPLANT
PENCIL SMOKE EVACUATOR (MISCELLANEOUS) ×3 IMPLANT
SPONGE LAP 18X18 X RAY DECT (DISPOSABLE) IMPLANT
STAPLER VISISTAT 35W (STAPLE) ×3 IMPLANT
SUCTION POOLE TIP (SUCTIONS) ×3 IMPLANT
SUT PDS AB 1 TP1 96 (SUTURE) IMPLANT
SUT SILK 2 0 (SUTURE) ×3
SUT SILK 2 0 SH CR/8 (SUTURE) ×3 IMPLANT
SUT SILK 2-0 18XBRD TIE 12 (SUTURE) ×1 IMPLANT
SUT SILK 3 0 (SUTURE) ×3
SUT SILK 3 0 SH CR/8 (SUTURE) ×3 IMPLANT
SUT SILK 3-0 18XBRD TIE 12 (SUTURE) ×1 IMPLANT
TOWEL OR 17X26 10 PK STRL BLUE (TOWEL DISPOSABLE) ×3 IMPLANT
TRAY FOLEY MTR SLVR 16FR STAT (SET/KITS/TRAYS/PACK) ×3 IMPLANT
YANKAUER SUCT BULB TIP NO VENT (SUCTIONS) IMPLANT

## 2018-03-22 NOTE — ED Notes (Signed)
RN with patient getting blood cultures and starting second IV -- will call CT when ready

## 2018-03-22 NOTE — Op Note (Signed)
Preoperative diagnosis: free air  Postoperative diagnosis: perforated duodenal ulcer   Procedure: exploratory laparotomy, lysis of adhesions, modified graham's patch  Surgeon: Gurney Maxin, M.D.  Asst: none  Anesthesia: general  Indications for procedure: [name] is a 83 y.o. year old female with symptoms of 3 days of abdominal pain and findings of free air on imaging concerning for perforated ulcer.  Description of procedure: The patient was brought into the operative suite. Anesthesia was administered with General endotracheal anesthesia. WHO checklist was applied. The patient was then placed in supine position. The area was prepped and draped in the usual sterile fashion.  Next, a upper midline incision was made and cautery was used to dissect through the subcutaneous layer. The fascia was entered through the midline. Upon entering the peritoneal cavity a large amount of tan fluid was seen and removed. The falciform ligament was divided. Examination of the duodenum and stomach identified a 5mm hole on the superior anterior surface of the first portion of the duodenum. A Balfour was put in place for retraction. Omentum was identified and multiple adhesions were removed from the abdominal wall to allow mobility of the omentum. A tongue was created. 3 3-0 silks were used to suture the hole closed and then the tongue of omentum was brought up over the hole and tied down using the silks again. The abdomen was irrigated with warm saline. The NG tube was checked for position and taped in place. A 19 fr blake drain was put into the right upper abdomen and brought along the superior edge of the duodenum and left stomach and sutured in place at the skin with 2-0 nylon. The fascia was closed with running 0 PDS. The skin was closed with staples. Island dressing was put in place  Findings: perforated duodenal ulcer  Specimen: none  Implant: 31fr blake drain   Blood loss: 62ml  Local  anesthesia: none  Complications: none  Gurney Maxin, M.D. General, Bariatric, & Minimally Invasive Surgery [facility]

## 2018-03-22 NOTE — ED Provider Notes (Signed)
[facility] Emergency Department Provider Note MRN:  [id number]  Reid Hope King date & time: 03/22/18     Chief Complaint   Abdominal Pain   History of Present Illness   Champagne Paletta is a 83 y.o. year-old female with a history of aortic stenosis status post TAVR, diabetes, pacemaker presenting to the ED with chief complaint of abdominal pain.  Sudden onset abdominal pain, located in the epigastrium, began yesterday afternoon, has been constant, slowly worsening since that time.  Also complaining of nausea, no vomiting, general malaise, was febrile at her PCPs office today, sent here for closer evaluation.  Denies chest pain or shortness of breath, no black or bloody stools.  Review of Systems  A complete 10 system review of systems was obtained and all systems are negative except as noted in the HPI and PMH.   Patient's Health History    Past Medical History:  Diagnosis Date  . Anxiety   . Aortic stenosis   . Arthritis   . Breast cancer [facility])    CANCER  . Diabetes mellitus    Type II  . Dyspnea   . Heart murmur   . History of chemotherapy   . History of kidney stones   . Hypertension   . Mitral stenosis   . Morbid obesity (HCC)    BMI > 40  . Ovarian cancer ([place])   . Presence of permanent cardiac pacemaker    St. Jude  . Pulmonary embolism ([place]) 2013   post op  . 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 Spinal stenosis     Past Surgical History:  Procedure Laterality Date  . ABDOMINAL HYSTERECTOMY  2013  . BREAST SURGERY  03-2004   lumpectomy   . CARDIAC CATHETERIZATION    . INSERT / REPLACE / REMOVE PACEMAKER    . INTRAOPERATIVE TRANSTHORACIC ECHOCARDIOGRAM  07/17/2017   Procedure: INTRAOPERATIVE TRANSTHORACIC ECHOCARDIOGRAM;  Surgeon: Sherren Mocha, MD;  Location: [place];  Service: Open Heart Surgery;;  .  LITHOTRIPSY    . PACEMAKER IMPLANT N/A 04/21/2016   Procedure: Pacemaker Implant;  Surgeon: Will Meredith Leeds, MD;  Location: [place] CV LAB;  Service: Cardiovascular;  Laterality: N/A;  . TONSILLECTOMY    . TRANSCATHETER AORTIC VALVE REPLACEMENT, TRANSFEMORAL N/A 07/17/2017   Procedure: TRANSCATHETER AORTIC VALVE REPLACEMENT, TRANSFEMORAL;  Surgeon: Sherren Mocha, MD;  Location: [place];  Service: Open Heart Surgery;  Laterality: N/A;    Family History  Problem Relation Age of Onset  . Cancer Father        BLADDER  . Cancer Brother   . Cancer Sister     Social History   Socioeconomic History  . Marital status: Married    Spouse name: Not on file  . Number of children: Not on file  . Years of education: Not on file  . Highest education level: High school graduate  Occupational History  . Not on file  Social Needs  . Financial resource strain: Not hard at all  . Food insecurity:    Worry: Never true    Inability: Never true  . Transportation needs:    Medical: Not on file    Non-medical: Not on file  Tobacco Use  . Smoking status: Former Smoker    Packs/day: 1.00    Years: 5.00    Pack years: 5.00    Types: Cigarettes  .  Smokeless tobacco: Never Used  Substance and Sexual Activity  . Alcohol use: No  . Drug use: No  . Sexual activity: Not on file  Lifestyle  . Physical activity:    Days per week: 0 days    Minutes per session: 0 min  . Stress: Not at all  Relationships  . Social connections:    Talks on phone: Not on file    Gets together: Not on file    Attends religious service: Not on file    Active member of club or organization: Not on file    Attends meetings of clubs or organizations: Not on file    Relationship status: Not on file  . Intimate partner violence:    Fear of current or ex partner: Not on file    Emotionally abused: Not on file    Physically abused: Not on file    Forced sexual activity: Not on file  Other Topics Concern  . Not on  file  Social History Narrative  . Not on file     Physical Exam  Vital Signs and Nursing Notes reviewed Vitals:   03/22/18 1740 03/22/18 1900  BP: 112/61 (!) 108/55  Pulse: (!) 106 (!) 116  Resp: 18 (!) 28  Temp: 99.4 F (37.4 C) 100.3 F (37.9 C)  SpO2: 95% 93%    CONSTITUTIONAL: Well-appearing, NAD NEURO:  Alert and oriented x 3, no focal deficits EYES:  eyes equal and reactive ENT/NECK:  no LAD, no JVD CARDIO: Tachycardic rate, well-perfused, normal S1 and S2 PULM:  CTAB no wheezing or rhonchi GI/GU:  normal bowel sounds, non-distended, mild tenderness palpation of the epigastrium MSK/SPINE:  No gross deformities, no edema SKIN:  no rash, atraumatic PSYCH:  Appropriate speech and behavior  Diagnostic and Interventional Summary    EKG Interpretation  Date/Time:    Ventricular Rate:    PR Interval:    QRS Duration:   QT Interval:    QTC Calculation:   R Axis:     Text Interpretation:        Labs Reviewed  CBC WITH DIFFERENTIAL/PLATELET - Abnormal; Notable for the following components:      Result Value   WBC 18.6 (*)    Neutro Abs 15.1 (*)    Monocytes Absolute 1.7 (*)    Abs Immature Granulocytes 0.11 (*)    All other components within normal limits  COMPREHENSIVE METABOLIC PANEL - Abnormal; Notable for the following components:   Glucose, Bld 161 (*)    BUN 36 (*)    Creatinine, Ser 1.31 (*)    GFR calc non Af Amer 38 (*)    GFR calc Af Amer 44 (*)    All other components within normal limits  URINALYSIS, ROUTINE W REFLEX MICROSCOPIC - Abnormal; Notable for the following components:   APPearance CLOUDY (*)    Hgb urine dipstick SMALL (*)    Protein, ur 30 (*)    Leukocytes, UA MODERATE (*)    All other components within normal limits  URINALYSIS, MICROSCOPIC (REFLEX) - Abnormal; Notable for the following components:   Bacteria, UA MANY (*)    All other components within normal limits  I-STAT CG4 LACTIC ACID, ED - Abnormal; Notable for the  following components:   Lactic Acid, Venous 3.83 (*)    All other components within normal limits  CULTURE, BLOOD (ROUTINE X 2)  CULTURE, BLOOD (ROUTINE X 2)  TROPONIN I    DG Chest 2 View  Final Result  CT ABDOMEN PELVIS W CONTRAST    (Results Pending)    Medications  sodium chloride 0.9 % bolus 1,000 mL (1,000 mLs Intravenous New Bag/Given 03/22/18 1951)  piperacillin-tazobactam (ZOSYN) IVPB 3.375 g (3.375 g Intravenous New Bag/Given 03/22/18 1952)  fentaNYL (SUBLIMAZE) 100 MCG/2ML injection (has no administration in time range)  sodium chloride 0.9 % bolus 500 mL (has no administration in time range)  0.9 %  sodium chloride infusion (has no administration in time range)  iopamidol (ISOVUE-300) 61 % injection 100 mL (80 mLs Intravenous Contrast Given 03/22/18 1930)  fentaNYL (SUBLIMAZE) injection 50 mcg (50 mcg Intravenous Given 03/22/18 1953)     Procedures Critical Care Critical Care Documentation Critical care time provided by me (excluding procedures): 38 minutes  Condition necessitating critical care: Concern for sepsis of intra-abdominal origin, perforated viscus  Components of critical care management: reviewing of prior records, laboratory and imaging interpretation, frequent re-examination and reassessment of vital signs, administration of IV fluids, IV antibiotics, discussion with consulting services, facilitation of transfer.    ED Course and Medical Decision Making  I have reviewed the triage vital signs and the nursing notes.  Pertinent labs & imaging results that were available during my care of the patient were reviewed by me and considered in my medical decision making (see below for details).  Concern for sepsis of intra-abdominal origin in this 83 year old female with tachycardia, leukocytosis, abdominal pain.  X-ray reveals air under the diaphragm concerning for perforation.  Code sepsis initiated, IV Zosyn, IV antibiotics, CT pending.  Concern for large  bowel perforation related to diverticular according to Dr. Carlis Abbott of radiology.  Spoke with Dr. Melinda Crutch of general surgery at [facility], who will see the patient in the ED.  We will transfer ED to ED.  Has received Zosyn, 1.5 L normal saline, on maintenance normal saline, fentanyl for pain.  Barth Kirks. [place] Small, [place] [email or web address]  Final Clinical Impressions(s) / ED Diagnoses     ICD-10-CM   1. Perforated viscus R19.8     ED Discharge Orders    None         Maudie Flakes, MD 03/22/18 2013

## 2018-03-22 NOTE — ED Notes (Signed)
Report given to [facility]. 

## 2018-03-22 NOTE — ED Notes (Signed)
Pt came in for abdominal pain after being sent from [place] walk in clinic. At the clinic, she had a fever and was given a GI cocktail for pain, and was told to come to the ED. Pt's abdominal pain started yesterday and increased today, making it difficult for her to take a deep breath. Pt is quite pleasant, lives at home with her sister, ambulates with cane.

## 2018-03-22 NOTE — ED Notes (Signed)
Pt's niece took sister home, will meet her at [facility] ED. Family took belongings other than pt's glasses and the jewelery she is currently wearing. Pt states the pain is easing off and appears more comfortable.

## 2018-03-22 NOTE — ED Notes (Signed)
Pt remains in CT

## 2018-03-22 NOTE — ED Notes (Signed)
General Surgery paged per Dr. Hayden Pedro request. [facility]

## 2018-03-22 NOTE — ED Triage Notes (Addendum)
Pt sent here by PMD for eval of epigastric abd pain also increased pain with deep breathing  x 1 day

## 2018-03-22 NOTE — ED Notes (Signed)
Report called to [place] charge nurse [facility] ED

## 2018-03-22 NOTE — Anesthesia Procedure Notes (Signed)
Procedure Name: Intubation Date/Time: 03/22/2018 11:14 PM Performed by: Clovis Cao, CRNA Pre-anesthesia Checklist: Patient identified, Emergency Drugs available, Suction available, Patient being monitored and Timeout performed Patient Re-evaluated:Patient Re-evaluated prior to induction Oxygen Delivery Method: Circle system utilized Preoxygenation: Pre-oxygenation with 100% oxygen Induction Type: IV induction, Rapid sequence and Cricoid Pressure applied Laryngoscope Size: Miller and 2 Grade View: Grade I Tube type: Oral Tube size: 7.5 mm Number of attempts: 1 Airway Equipment and Method: Stylet Placement Confirmation: ETT inserted through vocal cords under direct vision,  positive ETCO2 and breath sounds checked- equal and bilateral Secured at: 21 cm Tube secured with: Tape Dental Injury: Teeth and Oropharynx as per pre-operative assessment

## 2018-03-22 NOTE — Anesthesia Preprocedure Evaluation (Addendum)
Anesthesia Evaluation  Patient identified by MRN, date of birth, ID band Patient awake    Reviewed: Allergy & Precautions, NPO status , Patient's Chart, lab work & pertinent test results  Airway Mallampati: II  TM Distance: >3 FB Neck ROM: Full    Dental  (+) Upper Dentures, Dental Advisory Given, Caps   Pulmonary shortness of breath, former smoker,    Pulmonary exam normal breath sounds clear to auscultation       Cardiovascular hypertension, Pt. on medications and Pt. on home beta blockers + pacemaker + Valvular Problems/Murmurs AS  Rhythm:Regular Rate:Normal + Systolic murmurs ECG: NSR, rate 64  CATH: 1. Widely patent coronary arteries without obstructive CAD 2. Severely calcified aortic valve with restricted motion, known severe AS by echo 3. Severely calcified mitral valve annulus 4. Mild pulmonary HTN, elevated RA pressure, preserved cardiac output  ECHO: LV EF: 55% -   60% Left ventricle: The cavity size was normal. There was moderate concentric hypertrophy. Systolic function was normal. The estimated ejection fraction was in the range of 55% to 60%. Wall motion was normal; there were no regional wall motion abnormalities. Doppler parameters are consistent with abnormal left ventricular relaxation (grade 1 diastolic dysfunction). Doppler parameters are consistent with high ventricular filling pressure. Aortic valve: Probably trileaflet; severely thickened, severely calcified leaflets. Valve mobility was restricted. There was critical stenosis. There was no regurgitation. Peak velocity (S): 478 cm/s. Mean gradient (S): 62 mm Hg. Valve area (VTI): 0.57 cm^2. Valve area (Vmax): 0.65 cm^2. Valve area (Vmean): 0.51 cm^2. Mitral valve: Severely calcified annulus. Mobility was restricted. The findings are consistent with mild stenosis. There was mild regurgitation. Valve area by pressure half-time: 1.93 cm^2. Valve area by continuity  equation (using LVOT flow): 1.43 cm^2. Left atrium: The atrium was severely dilated. Right ventricle: The cavity size was normal. Wall thickness was normal. Systolic function was normal. Tricuspid valve: There was mild regurgitation. Pulmonary arteries: Systolic pressure was mildly increased. PA peak pressure: 44 mm Hg (S).         Neuro/Psych Anxiety negative neurological ROS     GI/Hepatic negative GI ROS, Neg liver ROS,   Endo/Other  diabetes, Insulin DependentMorbid obesity  Renal/GU negative Renal ROS     Musculoskeletal negative musculoskeletal ROS (+)   Abdominal   Peds  Hematology HLD   Anesthesia Other Findings Severe Aortic Stenosis  Reproductive/Obstetrics                             Anesthesia Physical  Anesthesia Plan  ASA: IV and emergent  Anesthesia Plan: General   Post-op Pain Management:    Induction: Intravenous, Rapid sequence and Cricoid pressure planned  PONV Risk Score and Plan: 4 or greater and Ondansetron, Dexamethasone and Treatment may vary due to age or medical condition  Airway Management Planned: Oral ETT  Additional Equipment: None  Intra-op Plan:   Post-operative Plan: Extubation in OR  Informed Consent: I have reviewed the patients History and Physical, chart, labs and discussed the procedure including the risks, benefits and alternatives for the proposed anesthesia with the patient or authorized representative who has indicated his/her understanding and acceptance.     Dental advisory given  Plan Discussed with: CRNA, Anesthesiologist and Surgeon  Anesthesia Plan Comments:        Anesthesia Quick Evaluation

## 2018-03-22 NOTE — ED Notes (Signed)
Date and time results received: 03/22/18 1925 (use smartphrase ".now" to insert current time)  Test: ISTAT Lactic Acid Critical Value:3.83  Name of Provider Notified: Dr. Viona Gilmore  Orders Received? Or Actions Taken?:

## 2018-03-22 NOTE — ED Provider Notes (Signed)
Blood pressure (!) 118/47, pulse 94, temperature 100.3 F (37.9 C), temperature source Oral, resp. rate (!) 26, height 5\' 7"  (1.702 m), weight 123 kg, SpO2 94 %.  In short, [name] is a 83 y.o. female with a chief complaint of Abdominal Pain .  Refer to the original H&P for additional details.  Called Dr. Kieth Brightly upon patient's arrival. Patient with returning abdominal pain. Given Fentanyl.   Discussed patient's case with General Surgery to request admission. Patient and family (if present) updated with plan. Care transferred to Surgery service.  I reviewed all nursing notes, vitals, pertinent old records, EKGs, labs, imaging (as available).     Margette Fast, MD 03/22/18 2204

## 2018-03-22 NOTE — ED Notes (Signed)
Pt in CT.

## 2018-03-22 NOTE — Progress Notes (Signed)
Reason for Consult: free air Referring Physician: [place], [name] is an 83 y.o. female.  HPI: 83 yo female with 3 days of epigastric pain. Pain was constant, intense. It did not radiate. She has nausea and did not tolerate much food. Pain worsened and so she came to the ER. The EMS ride was very uncomfortable due to the pain. She has never had a pain like this before. Her arthritis has been flaring and she has taken several NSAIDS over the last few months.  Past Medical History:  Diagnosis Date  . Anxiety   . Aortic stenosis   . Arthritis   . Breast cancer [facility])    CANCER  . Diabetes mellitus    Type II  . Dyspnea   . Heart murmur   . History of chemotherapy   . History of kidney stones   . Hypertension   . Mitral stenosis   . Morbid obesity (HCC)    BMI > 40  . Ovarian cancer ([place])   . Presence of permanent cardiac pacemaker    St. Jude  . Pulmonary embolism ([place]) 2013   post op  . 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 Spinal stenosis     Past Surgical History:  Procedure Laterality Date  . ABDOMINAL HYSTERECTOMY  2013  . BREAST SURGERY  03-2004   lumpectomy   . CARDIAC CATHETERIZATION    . INSERT / REPLACE / REMOVE PACEMAKER    . INTRAOPERATIVE TRANSTHORACIC ECHOCARDIOGRAM  07/17/2017   Procedure: INTRAOPERATIVE TRANSTHORACIC ECHOCARDIOGRAM;  Surgeon: Sherren Mocha, MD;  Location: [place];  Service: Open Heart Surgery;;  . LITHOTRIPSY    . PACEMAKER IMPLANT N/A 04/21/2016   Procedure: Pacemaker Implant;  Surgeon: Will Meredith Leeds, MD;  Location: [place] CV LAB;  Service: Cardiovascular;  Laterality: N/A;  . TONSILLECTOMY    . TRANSCATHETER AORTIC VALVE REPLACEMENT, TRANSFEMORAL N/A 07/17/2017   Procedure: TRANSCATHETER AORTIC VALVE REPLACEMENT, TRANSFEMORAL;  Surgeon: Sherren Mocha, MD;  Location: [place];  Service: Open  Heart Surgery;  Laterality: N/A;    Family History  Problem Relation Age of Onset  . Cancer Father        BLADDER  . Cancer Brother   . Cancer Sister     Social History:  reports that she has quit smoking. Her smoking use included cigarettes. She has a 5.00 pack-year smoking history. She has never used smokeless tobacco. She reports that she does not drink alcohol or use drugs.  Allergies:  Allergies  Allergen Reactions  . Tape Rash    CLEAR PLASTIC TAPE    Medications: I have reviewed the patient's current medications.  Results for orders placed or performed during the hospital encounter of 03/22/18 (from the past 48 hour(s))  CBC with Differential     Status: Abnormal   Collection Time: 03/22/18  5:51 PM  Result Value Ref Range   WBC 18.6 (H) 4.0 - 10.5 K/uL   RBC 4.34 3.87 - 5.11 MIL/uL   Hemoglobin 12.8 12.0 - 15.0 g/dL   HCT 40.3 36.0 - 46.0 %   MCV 92.9 80.0 - 100.0 fL   MCH 29.5 26.0 - 34.0 pg   MCHC 31.8 30.0 - 36.0 g/dL   RDW 14.8 11.5 - 15.5 %   Platelets 218 150 - 400 K/uL   nRBC 0.0 0.0 - 0.2 %   Neutrophils Relative %  81 %   Neutro Abs 15.1 (H) 1.7 - 7.7 K/uL   Lymphocytes Relative 9 %   Lymphs Abs 1.6 0.7 - 4.0 K/uL   Monocytes Relative 9 %   Monocytes Absolute 1.7 (H) 0.1 - 1.0 K/uL   Eosinophils Relative 0 %   Eosinophils Absolute 0.0 0.0 - 0.5 K/uL   Basophils Relative 0 %   Basophils Absolute 0.0 0.0 - 0.1 K/uL   Immature Granulocytes 1 %   Abs Immature Granulocytes 0.11 (H) 0.00 - 0.07 K/uL    Comment: Performed at [facility], [address] [place], [place] [address]  Comprehensive metabolic panel     Status: Abnormal   Collection Time: 03/22/18  5:51 PM  Result Value Ref Range   Sodium 135 135 - 145 mmol/L   Potassium 4.7 3.5 - 5.1 mmol/L   Chloride 102 98 - 111 mmol/L   CO2 23 22 - 32 mmol/L   Glucose, Bld 161 (H) 70 - 99 mg/dL   BUN 36 (H) 8 - 23 mg/dL   Creatinine, Ser 1.31 (H) 0.44 - 1.00 mg/dL   Calcium 9.2 8.9 - 10.3  mg/dL   Total Protein 7.4 6.5 - 8.1 g/dL   Albumin 3.6 3.5 - 5.0 g/dL   AST 20 15 - 41 U/L   ALT 15 0 - 44 U/L   Alkaline Phosphatase 55 38 - 126 U/L   Total Bilirubin 1.1 0.3 - 1.2 mg/dL   GFR calc non Af Amer 38 (L) >60 mL/min   GFR calc Af Amer 44 (L) >60 mL/min   Anion gap 10 5 - 15    Comment: Performed at [facility], [address] [place], [place] [address]  Urinalysis, Routine w reflex microscopic     Status: Abnormal   Collection Time: 03/22/18  6:41 PM  Result Value Ref Range   Color, Urine YELLOW YELLOW   APPearance CLOUDY (A) CLEAR   Specific Gravity, Urine 1.010 1.005 - 1.030   pH 7.0 5.0 - 8.0   Glucose, UA NEGATIVE NEGATIVE mg/dL   Hgb urine dipstick SMALL (A) NEGATIVE   Bilirubin Urine NEGATIVE NEGATIVE   Ketones, ur NEGATIVE NEGATIVE mg/dL   Protein, ur 30 (A) NEGATIVE mg/dL   Nitrite NEGATIVE NEGATIVE   Leukocytes, UA MODERATE (A) NEGATIVE    Comment: Performed at [facility], [address] [place], [place] [address]  Urinalysis, Microscopic (reflex)     Status: Abnormal   Collection Time: 03/22/18  6:41 PM  Result Value Ref Range   RBC / HPF 11-20 0 - 5 RBC/hpf   WBC, UA 21-50 0 - 5 WBC/hpf   Bacteria, UA MANY (A) NONE SEEN   Squamous Epithelial / LPF 0-5 0 - 5   Mucus PRESENT     Comment: Performed at [facility], [address] [place], [place] [address]  Troponin I - ONCE - STAT     Status: None   Collection Time: 03/22/18  7:11 PM  Result Value Ref Range   Troponin I <0.03 <0.03 ng/mL    Comment: Performed at [facility], [address] [place], [place] [address]  I-Stat CG4 Lactic Acid, ED     Status: Abnormal   Collection Time: 03/22/18  7:17 PM  Result Value Ref Range   Lactic Acid, Venous 3.83 (HH) 0.5 - 1.9 mmol/L   Comment NOTIFIED PHYSICIAN     Dg Chest 2  View  Result Date: 03/22/2018 CLINICAL DATA:  Pain burning in the upper abdomen EXAM: CHEST - 2 VIEW COMPARISON:  07/13/2017,  04/12/2016 FINDINGS: There is no focal parenchymal opacity. There is no pleural effusion or pneumothorax. There is stable cardiomegaly. There is a dual lead cardiac pacemaker. There is air beneath right and left diaphragm concerning for pneumoperitoneum. The osseous structures are unremarkable. IMPRESSION: Air beneath the right diaphragm concerning for pneumoperitoneum. Recommend further evaluation with an upright, supine and left decubitus abdominal x-ray. Critical Value/emergent results were called by telephone at the time of interpretation on 03/22/2018 at 6:49 pm to Dr. Gerlene Fee , who verbally acknowledged these results. Electronically Signed   By: Kathreen Devoid   On: 03/22/2018 18:51   Ct Abdomen Pelvis W Contrast  Result Date: 03/22/2018 CLINICAL DATA:  Upper abdominal pain. History of ovarian cancer and breast cancer. EXAM: CT ABDOMEN AND PELVIS WITH CONTRAST TECHNIQUE: Multidetector CT imaging of the abdomen and pelvis was performed using the standard protocol following bolus administration of intravenous contrast. CONTRAST:  41mL ISOVUE-300 IOPAMIDOL (ISOVUE-300) INJECTION 61% COMPARISON:  CT abdomen pelvis 09/20/2017 FINDINGS: Lower chest: Mild bibasilar atelectasis. Negative for effusion. Pacemaker. Aortic valve replacement. Hepatobiliary: 12 mm calcified gallstone. No gallbladder wall thickening or biliary dilatation. No focal liver lesion. Pancreas: Negative Spleen: Negative Adrenals/Urinary Tract: Multiple large renal calculi bilaterally. No renal obstruction. No ureteral stone. Multiple left renal cysts unchanged from the prior study. Urinary bladder is thick-walled and empty. Stomach/Bowel: Negative for bowel obstruction. No bowel mass or edema. Moderate free air in the abdomen. In addition, there is mild free fluid in the pelvis. Location of bowel perforation not definitely identified but possibly in the splenic flexure. Possible diverticular rupture. Vascular/Lymphatic: Negative Reproductive:  Hysterectomy.  Negative for pelvic mass Other: None Musculoskeletal: Multilevel degenerative change in the lumbar spine. No acute skeletal abnormality. Advanced degenerative change in the hip bilaterally. IMPRESSION: 1. Moderate free air throughout the abdomen compatible with bowel perforation. No bowel obstruction. Site of perforation not definitely identified but possibly in the splenic flexure at the site of previously noted diverticula. 2. Mild free fluid in the pelvis.  No abscess 3. Calcified gallstone 4. Bilateral renal calculi without renal obstruction. 5. These results were called by telephone at the time of interpretation on 03/22/2018 at 8:11 pm to Dr. Gerlene Fee , who verbally acknowledged these results. Electronically Signed   By: Franchot Gallo M.D.   On: 03/22/2018 20:12    Review of Systems  Constitutional: Negative for chills and fever.  HENT: Negative for hearing loss.   Eyes: Negative for blurred vision and double vision.  Respiratory: Negative for cough and hemoptysis.   Cardiovascular: Negative for chest pain and palpitations.  Gastrointestinal: Positive for abdominal pain, nausea and vomiting.  Genitourinary: Negative for dysuria and urgency.  Musculoskeletal: Negative for myalgias and neck pain.  Skin: Negative for itching and rash.  Neurological: Negative for dizziness, tingling and headaches.  Endo/Heme/Allergies: Does not bruise/bleed easily.  Psychiatric/Behavioral: Negative for depression and suicidal ideas.   Blood pressure (!) 118/47, pulse 94, temperature 100.3 F (37.9 C), temperature source Oral, resp. rate (!) 26, height 5\' 7"  (1.702 m), weight 123 kg, SpO2 94 %. Physical Exam  Vitals reviewed. Constitutional: She is oriented to person, place, and time. She appears well-developed and well-nourished.  HENT:  Head: Normocephalic and atraumatic.  Eyes: Pupils are equal, round, and reactive to light. Conjunctivae and EOM are normal.  Neck: Normal range of  motion. Neck supple.  Cardiovascular: Normal rate and regular rhythm.  Respiratory: Effort normal and breath sounds normal.  GI: Soft. Bowel sounds are normal. She exhibits no distension. There is abdominal tenderness in the epigastric area. There is rebound and guarding.  Musculoskeletal: Normal range of motion.  Neurological: She is alert and oriented to person, place, and time.  Skin: Skin is warm and dry.  Psychiatric: She has a normal mood and affect. Her behavior is normal.      Assessment/Plan: 83 yo female with free air, leukocytosis, acidosis and epigastric pain -ex lap for likely graham's patch -IV abx -admit to hospital, possible ICU admission  [name] 03/22/2018, 10:04 PM

## 2018-03-23 DIAGNOSIS — Z953 Presence of xenogenic heart valve: Secondary | ICD-10-CM | POA: Diagnosis not present

## 2018-03-23 DIAGNOSIS — Z950 Presence of cardiac pacemaker: Secondary | ICD-10-CM | POA: Diagnosis not present

## 2018-03-23 DIAGNOSIS — K261 Acute duodenal ulcer with perforation: Secondary | ICD-10-CM | POA: Diagnosis not present

## 2018-03-23 DIAGNOSIS — I10 Essential (primary) hypertension: Secondary | ICD-10-CM | POA: Diagnosis present

## 2018-03-23 DIAGNOSIS — E119 Type 2 diabetes mellitus without complications: Secondary | ICD-10-CM | POA: Diagnosis not present

## 2018-03-23 DIAGNOSIS — M1990 Unspecified osteoarthritis, unspecified site: Secondary | ICD-10-CM | POA: Diagnosis present

## 2018-03-23 DIAGNOSIS — F419 Anxiety disorder, unspecified: Secondary | ICD-10-CM | POA: Diagnosis present

## 2018-03-23 DIAGNOSIS — Z9221 Personal history of antineoplastic chemotherapy: Secondary | ICD-10-CM | POA: Diagnosis not present

## 2018-03-23 DIAGNOSIS — F329 Major depressive disorder, single episode, unspecified: Secondary | ICD-10-CM | POA: Diagnosis present

## 2018-03-23 DIAGNOSIS — E785 Hyperlipidemia, unspecified: Secondary | ICD-10-CM | POA: Diagnosis present

## 2018-03-23 DIAGNOSIS — Z853 Personal history of malignant neoplasm of breast: Secondary | ICD-10-CM | POA: Diagnosis not present

## 2018-03-23 DIAGNOSIS — Z87442 Personal history of urinary calculi: Secondary | ICD-10-CM | POA: Diagnosis not present

## 2018-03-23 DIAGNOSIS — Z809 Family history of malignant neoplasm, unspecified: Secondary | ICD-10-CM | POA: Diagnosis not present

## 2018-03-23 DIAGNOSIS — K265 Chronic or unspecified duodenal ulcer with perforation: Secondary | ICD-10-CM | POA: Diagnosis not present

## 2018-03-23 DIAGNOSIS — Z8052 Family history of malignant neoplasm of bladder: Secondary | ICD-10-CM | POA: Diagnosis not present

## 2018-03-23 DIAGNOSIS — Z7901 Long term (current) use of anticoagulants: Secondary | ICD-10-CM | POA: Diagnosis not present

## 2018-03-23 DIAGNOSIS — R1013 Epigastric pain: Secondary | ICD-10-CM | POA: Diagnosis not present

## 2018-03-23 DIAGNOSIS — E872 Acidosis: Secondary | ICD-10-CM | POA: Diagnosis not present

## 2018-03-23 DIAGNOSIS — M4800 Spinal stenosis, site unspecified: Secondary | ICD-10-CM | POA: Diagnosis present

## 2018-03-23 DIAGNOSIS — Z8543 Personal history of malignant neoplasm of ovary: Secondary | ICD-10-CM | POA: Diagnosis not present

## 2018-03-23 DIAGNOSIS — Z90710 Acquired absence of both cervix and uterus: Secondary | ICD-10-CM | POA: Diagnosis not present

## 2018-03-23 DIAGNOSIS — K8020 Calculus of gallbladder without cholecystitis without obstruction: Secondary | ICD-10-CM | POA: Diagnosis not present

## 2018-03-23 DIAGNOSIS — Z6841 Body Mass Index (BMI) 40.0 and over, adult: Secondary | ICD-10-CM | POA: Diagnosis not present

## 2018-03-23 DIAGNOSIS — Z87891 Personal history of nicotine dependence: Secondary | ICD-10-CM | POA: Diagnosis not present

## 2018-03-23 DIAGNOSIS — Z91048 Other nonmedicinal substance allergy status: Secondary | ICD-10-CM | POA: Diagnosis not present

## 2018-03-23 DIAGNOSIS — I080 Rheumatic disorders of both mitral and aortic valves: Secondary | ICD-10-CM | POA: Diagnosis not present

## 2018-03-23 DIAGNOSIS — Z86711 Personal history of pulmonary embolism: Secondary | ICD-10-CM | POA: Diagnosis not present

## 2018-03-23 HISTORY — DX: Chronic or unspecified duodenal ulcer with perforation: K26.5

## 2018-03-23 LAB — COMPREHENSIVE METABOLIC PANEL
ALT: 26 U/L (ref 0–44)
AST: 28 U/L (ref 15–41)
Albumin: 2.6 g/dL — ABNORMAL LOW (ref 3.5–5.0)
Alkaline Phosphatase: 51 U/L (ref 38–126)
Anion gap: 10 (ref 5–15)
BUN: 35 mg/dL — ABNORMAL HIGH (ref 8–23)
CO2: 23 mmol/L (ref 22–32)
Calcium: 8 mg/dL — ABNORMAL LOW (ref 8.9–10.3)
Chloride: 104 mmol/L (ref 98–111)
Creatinine, Ser: 1.53 mg/dL — ABNORMAL HIGH (ref 0.44–1.00)
GFR calc Af Amer: 37 mL/min — ABNORMAL LOW (ref >60)
GFR calc non Af Amer: 32 mL/min — ABNORMAL LOW (ref >60)
Glucose, Bld: 154 mg/dL — ABNORMAL HIGH (ref 70–99)
Potassium: 4.5 mmol/L (ref 3.5–5.1)
Sodium: 137 mmol/L (ref 135–145)
TOTAL PROTEIN: 5.7 g/dL — AB (ref 6.5–8.1)
Total Bilirubin: 0.8 mg/dL (ref 0.3–1.2)

## 2018-03-23 LAB — CBC
HCT: 35 % — ABNORMAL LOW (ref 36.0–46.0)
Hemoglobin: 11.4 g/dL — ABNORMAL LOW (ref 12.0–15.0)
MCH: 30.4 pg (ref 26.0–34.0)
MCHC: 32.6 g/dL (ref 30.0–36.0)
MCV: 93.3 fL (ref 80.0–100.0)
PLATELETS: 194 10*3/uL (ref 150–400)
RBC: 3.75 MIL/uL — ABNORMAL LOW (ref 3.87–5.11)
RDW: 14.9 % (ref 11.5–15.5)
WBC: 15.8 10*3/uL — AB (ref 4.0–10.5)
nRBC: 0 % (ref 0.0–0.2)

## 2018-03-23 LAB — GLUCOSE, CAPILLARY
GLUCOSE-CAPILLARY: 127 mg/dL — AB (ref 70–99)
GLUCOSE-CAPILLARY: 127 mg/dL — AB (ref 70–99)
Glucose-Capillary: 104 mg/dL — ABNORMAL HIGH (ref 70–99)
Glucose-Capillary: 116 mg/dL — ABNORMAL HIGH (ref 70–99)
Glucose-Capillary: 117 mg/dL — ABNORMAL HIGH (ref 70–99)
Glucose-Capillary: 145 mg/dL — ABNORMAL HIGH (ref 70–99)
Glucose-Capillary: 152 mg/dL — ABNORMAL HIGH (ref 70–99)

## 2018-03-23 LAB — MRSA PCR SCREENING: MRSA by PCR: POSITIVE — AB

## 2018-03-23 MED ORDER — ENOXAPARIN SODIUM 40 MG/0.4ML ~~LOC~~ SOLN
40.0000 mg | Freq: Every day | SUBCUTANEOUS | Status: DC
  Administered 2018-03-23 – 2018-03-28 (×6): 40 mg via SUBCUTANEOUS
  Filled 2018-03-23 (×6): qty 0.4

## 2018-03-23 MED ORDER — HYDROMORPHONE HCL 1 MG/ML IJ SOLN
INTRAMUSCULAR | Status: AC
  Administered 2018-03-23: 0.25 mg via INTRAVENOUS
  Filled 2018-03-23: qty 1

## 2018-03-23 MED ORDER — ACETAMINOPHEN 650 MG RE SUPP
650.0000 mg | Freq: Four times a day (QID) | RECTAL | Status: DC | PRN
  Administered 2018-03-23: 650 mg via RECTAL
  Filled 2018-03-23: qty 1

## 2018-03-23 MED ORDER — PANTOPRAZOLE SODIUM 40 MG IV SOLR
40.0000 mg | Freq: Two times a day (BID) | INTRAVENOUS | Status: DC
  Administered 2018-03-23 – 2018-03-27 (×10): 40 mg via INTRAVENOUS
  Filled 2018-03-23 (×10): qty 40

## 2018-03-23 MED ORDER — DIPHENHYDRAMINE HCL 50 MG/ML IJ SOLN: 12.5000 mg | Freq: Four times a day (QID) | INTRAMUSCULAR | Status: DC | PRN

## 2018-03-23 MED ORDER — HYDROMORPHONE HCL 1 MG/ML IJ SOLN
0.2500 mg | INTRAMUSCULAR | Status: DC | PRN
  Administered 2018-03-23: 0.25 mg via INTRAVENOUS
  Administered 2018-03-23: 0.5 mg via INTRAVENOUS

## 2018-03-23 MED ORDER — ONDANSETRON 4 MG PO TBDP: 4.0000 mg | ORAL_TABLET | Freq: Four times a day (QID) | ORAL | Status: DC | PRN

## 2018-03-23 MED ORDER — ORAL CARE MOUTH RINSE
15.0000 mL | Freq: Two times a day (BID) | OROMUCOSAL | Status: DC
  Administered 2018-03-23 – 2018-03-28 (×8): 15 mL via OROMUCOSAL

## 2018-03-23 MED ORDER — PIPERACILLIN-TAZOBACTAM 3.375 G IVPB
3.3750 g | Freq: Three times a day (TID) | INTRAVENOUS | Status: AC
  Administered 2018-03-23 – 2018-03-27 (×15): 3.375 g via INTRAVENOUS
  Filled 2018-03-23 (×17): qty 50

## 2018-03-23 MED ORDER — DEXTROSE-NACL 5-0.45 % IV SOLN
INTRAVENOUS | Status: DC
  Administered 2018-03-23 – 2018-03-26 (×4): via INTRAVENOUS

## 2018-03-23 MED ORDER — CHLORHEXIDINE GLUCONATE 0.12 % MT SOLN
15.0000 mL | Freq: Two times a day (BID) | OROMUCOSAL | Status: DC
  Administered 2018-03-23 – 2018-03-27 (×9): 15 mL via OROMUCOSAL
  Filled 2018-03-23 (×9): qty 15

## 2018-03-23 MED ORDER — MEPERIDINE HCL 50 MG/ML IJ SOLN: 6.2500 mg | INTRAMUSCULAR | Status: DC | PRN

## 2018-03-23 MED ORDER — SODIUM CHLORIDE 0.9 % IV SOLN
INTRAVENOUS | Status: DC | PRN
  Administered 2018-03-23: 250 mL via INTRAVENOUS

## 2018-03-23 MED ORDER — MORPHINE SULFATE (PF) 2 MG/ML IV SOLN
2.0000 mg | INTRAVENOUS | Status: DC | PRN
  Administered 2018-03-23 – 2018-03-26 (×8): 2 mg via INTRAVENOUS
  Filled 2018-03-23 (×8): qty 1

## 2018-03-23 MED ORDER — METOPROLOL TARTRATE 5 MG/5ML IV SOLN: 5.0000 mg | Freq: Four times a day (QID) | INTRAVENOUS | Status: DC | PRN

## 2018-03-23 MED ORDER — PROMETHAZINE HCL 25 MG/ML IJ SOLN: 6.2500 mg | INTRAMUSCULAR | Status: DC | PRN

## 2018-03-23 MED ORDER — INSULIN ASPART 100 UNIT/ML ~~LOC~~ SOLN
0.0000 [IU] | SUBCUTANEOUS | Status: DC
  Administered 2018-03-23: 3 [IU] via SUBCUTANEOUS
  Administered 2018-03-23: 4 [IU] via SUBCUTANEOUS
  Administered 2018-03-23 – 2018-03-24 (×3): 3 [IU] via SUBCUTANEOUS
  Administered 2018-03-24: 4 [IU] via SUBCUTANEOUS
  Administered 2018-03-24: 3 [IU] via SUBCUTANEOUS
  Administered 2018-03-24: 4 [IU] via SUBCUTANEOUS
  Administered 2018-03-24: 3 [IU] via SUBCUTANEOUS
  Administered 2018-03-25: 4 [IU] via SUBCUTANEOUS
  Administered 2018-03-25 (×2): 3 [IU] via SUBCUTANEOUS
  Administered 2018-03-25: 5 [IU] via SUBCUTANEOUS
  Administered 2018-03-25 – 2018-03-26 (×3): 3 [IU] via SUBCUTANEOUS
  Administered 2018-03-26: 4 [IU] via SUBCUTANEOUS
  Administered 2018-03-26: 3 [IU] via SUBCUTANEOUS
  Administered 2018-03-26: 7 [IU] via SUBCUTANEOUS

## 2018-03-23 MED ORDER — ONDANSETRON HCL 4 MG/2ML IJ SOLN: 4.0000 mg | Freq: Four times a day (QID) | INTRAMUSCULAR | Status: DC | PRN

## 2018-03-23 MED ORDER — DIPHENHYDRAMINE HCL 12.5 MG/5ML PO ELIX: 12.5000 mg | ORAL_SOLUTION | Freq: Four times a day (QID) | ORAL | Status: DC | PRN

## 2018-03-23 MED ORDER — LACTATED RINGERS IV BOLUS
500.0000 mL | Freq: Once | INTRAVENOUS | Status: AC
  Administered 2018-03-23: 500 mL via INTRAVENOUS

## 2018-03-23 MED ORDER — OXYCODONE HCL 5 MG PO TABS
5.0000 mg | ORAL_TABLET | ORAL | Status: DC | PRN
  Filled 2018-03-23: qty 1

## 2018-03-23 MED ORDER — ACETAMINOPHEN 325 MG PO TABS
650.0000 mg | ORAL_TABLET | Freq: Four times a day (QID) | ORAL | Status: DC | PRN
  Administered 2018-03-25 – 2018-03-27 (×2): 650 mg via ORAL
  Filled 2018-03-23 (×2): qty 2

## 2018-03-23 MED ORDER — SUGAMMADEX SODIUM 200 MG/2ML IV SOLN
INTRAVENOUS | Status: DC | PRN
  Administered 2018-03-22: 300 mg via INTRAVENOUS

## 2018-03-23 NOTE — Progress Notes (Signed)
Subjective Feeling much better since OR. Denies n/v. Pain is well controlled. Hoping to get up and walk today.  Objective: Vital signs in last 24 hours: Temp:  [97.7 F (36.5 C)-100.3 F (37.9 C)] 98.5 F (36.9 C) (01/18 0400) Pulse Rate:  [79-116] 79 (01/18 0700) Resp:  [10-28] 12 (01/18 0700) BP: (81-133)/(45-70) 93/45 (01/18 0700) SpO2:  [85 %-100 %] 99 % (01/18 0700) Weight:  [122 kg-123 kg] 122 kg (01/18 0200) Last BM Date: 03/21/18  Intake/Output from previous day: 01/17 0701 - 01/18 0700 In: 2470.5 [I.V.:1725.2; NG/GT:10; IV Piggyback:585.3] Out: 830 [Urine:585; Emesis/NG output:20; Drains:175; Blood:50] Intake/Output this shift: No intake/output data recorded.  Gen: NAD, comfortable. NG in place CV: RRR Pulm: Normal work of breathing Abd: Soft, appropriately ttp around incision; JP in place with serosang output. Ext: SCDs in place  Lab Results: CBC  Recent Labs    03/22/18 1751 03/23/18 0516  WBC 18.6* 15.8*  HGB 12.8 11.4*  HCT 40.3 35.0*  PLT 218 194   BMET Recent Labs    03/22/18 1751 03/23/18 0516  NA 135 137  K 4.7 4.5  CL 102 104  CO2 23 23  GLUCOSE 161* 154*  BUN 36* 35*  CREATININE 1.31* 1.53*  CALCIUM 9.2 8.0*   PT/INR No results for input(s): LABPROT, INR in the last 72 hours. ABG No results for input(s): PHART, HCO3 in the last 72 hours.  Invalid input(s): PCO2, PO2  Studies/Results:  Anti-infectives: Anti-infectives (From admission, onward)   Start     Dose/Rate Route Frequency Ordered Stop   03/23/18 0400  piperacillin-tazobactam (ZOSYN) IVPB 3.375 g     3.375 g 12.5 mL/hr over 240 Minutes Intravenous Every 8 hours 03/23/18 0212     03/22/18 2315  cefoTEtan in Dextrose 5% (CEFOTAN) IVPB 2 g     2 g 100 mL/hr over 30 Minutes Intravenous To Surgery 03/22/18 2314 03/23/18 2315   03/22/18 1900  piperacillin-tazobactam (ZOSYN) IVPB 3.375 g     3.375 g 100 mL/hr over 30 Minutes Intravenous  Once 03/22/18 1854 03/22/18 2031        Assessment/Plan: Patient Active Problem List   Diagnosis Date Noted  . Perforated duodenal ulcer ([place]) 03/23/2018  . Severe aortic stenosis 07/17/2017  . 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 Mitral stenosis 10/02/2016  . Symptomatic bradycardia 04/21/2016  . Bradycardia 04/20/2016  . Morbid obesity ([place]) 04/20/2016  . Essential hypertension 04/20/2016  . Insulin dependent diabetes mellitus ([place]) 04/20/2016  . History of ovarian cancer 04/20/2016  . Aortic stenosis 04/20/2016  . Spinal stenosis 04/20/2016  . Complete heart block ([place]) 04/20/2016  . History of breast cancer 12/27/2010   [name] is a pleasant 45yoF with hx of HTN, DM, HLD, TAVR 07/2017, arthritis, presented with perforated DU - now s/p exlap/Graham patch, repair of perforated DU 03/22/2018 LK  -NPO, NGT, MIVF -500cc bolus -BID PPI; holding PO home medications until upper GI - will plan for this POD#3; does take Lasix at home - will restart potentially tomorrow  -PT/OT - ambulates at home with help of cane and walker -PPx: SCDs, Lov, BID PPI   LOS: 0 days   [name], M.D. [facility]

## 2018-03-23 NOTE — Progress Notes (Signed)
Call back from B.Small - device on/working - will send over interrogation report

## 2018-03-23 NOTE — Progress Notes (Signed)
Spoke w/ Dr. Dema Severin, General Surgery. Relayed that only 1 lactic acid has been collected on pt thus far (3.83).  Per Dr. Dema Severin, no need for additional blood draw at this time. Further relayed pts BP has been soft (SBP 90s-100/MAP 55-65). Received orders for 586ml bolus. Will continue to monitor.

## 2018-03-23 NOTE — Transfer of Care (Signed)
Immediate Anesthesia Transfer of Care Note  Patient: [name]  Procedure(s) Performed: grahams pouch, repair of bowel perforation (N/A Abdomen)  Patient Location: PACU  Anesthesia Type:General  Level of Consciousness: awake  Airway & Oxygen Therapy: Patient Spontanous Breathing and Patient connected to nasal cannula oxygen  Post-op Assessment: Report given to RN and Post -op Vital signs reviewed and stable  Post vital signs: Reviewed and stable  Last Vitals:  Vitals Value Taken Time  BP 133/61 03/23/2018 12:10 AM  Temp    Pulse 92 03/23/2018 12:12 AM  Resp 20 03/23/2018 12:12 AM  SpO2 87 % 03/23/2018 12:12 AM  Vitals shown include unvalidated device data.  Last Pain:  Vitals:   03/22/18 2031  TempSrc:   PainSc: 4          Complications: No apparent anesthesia complications

## 2018-03-23 NOTE — Progress Notes (Signed)
Attempt to call report to [phone]- charge nurse to call back

## 2018-03-24 LAB — COMPREHENSIVE METABOLIC PANEL
ALT: 20 U/L (ref 0–44)
AST: 14 U/L — ABNORMAL LOW (ref 15–41)
Albumin: 2.3 g/dL — ABNORMAL LOW (ref 3.5–5.0)
Alkaline Phosphatase: 59 U/L (ref 38–126)
Anion gap: 8 (ref 5–15)
BUN: 23 mg/dL (ref 8–23)
CALCIUM: 8.1 mg/dL — AB (ref 8.9–10.3)
CO2: 24 mmol/L (ref 22–32)
Chloride: 107 mmol/L (ref 98–111)
Creatinine, Ser: 1.34 mg/dL — ABNORMAL HIGH (ref 0.44–1.00)
GFR calc Af Amer: 43 mL/min — ABNORMAL LOW (ref >60)
GFR calc non Af Amer: 37 mL/min — ABNORMAL LOW (ref >60)
Glucose, Bld: 153 mg/dL — ABNORMAL HIGH (ref 70–99)
Potassium: 4.3 mmol/L (ref 3.5–5.1)
Sodium: 139 mmol/L (ref 135–145)
Total Bilirubin: 1.4 mg/dL — ABNORMAL HIGH (ref 0.3–1.2)
Total Protein: 5.5 g/dL — ABNORMAL LOW (ref 6.5–8.1)

## 2018-03-24 LAB — GLUCOSE, CAPILLARY
GLUCOSE-CAPILLARY: 141 mg/dL — AB (ref 70–99)
Glucose-Capillary: 124 mg/dL — ABNORMAL HIGH (ref 70–99)
Glucose-Capillary: 127 mg/dL — ABNORMAL HIGH (ref 70–99)
Glucose-Capillary: 127 mg/dL — ABNORMAL HIGH (ref 70–99)
Glucose-Capillary: 131 mg/dL — ABNORMAL HIGH (ref 70–99)
Glucose-Capillary: 152 mg/dL — ABNORMAL HIGH (ref 70–99)
Glucose-Capillary: 154 mg/dL — ABNORMAL HIGH (ref 70–99)

## 2018-03-24 LAB — CBC
HCT: 33.6 % — ABNORMAL LOW (ref 36.0–46.0)
Hemoglobin: 10.6 g/dL — ABNORMAL LOW (ref 12.0–15.0)
MCH: 29.7 pg (ref 26.0–34.0)
MCHC: 31.5 g/dL (ref 30.0–36.0)
MCV: 94.1 fL (ref 80.0–100.0)
PLATELETS: 166 10*3/uL (ref 150–400)
RBC: 3.57 MIL/uL — AB (ref 3.87–5.11)
RDW: 14.9 % (ref 11.5–15.5)
WBC: 13 10*3/uL — ABNORMAL HIGH (ref 4.0–10.5)
nRBC: 0 % (ref 0.0–0.2)

## 2018-03-24 MED ORDER — GERHARDT'S BUTT CREAM
TOPICAL_CREAM | Freq: Three times a day (TID) | CUTANEOUS | Status: DC
  Administered 2018-03-24 – 2018-03-26 (×8): via TOPICAL
  Administered 2018-03-27: 1 via TOPICAL
  Administered 2018-03-27: 23:00:00 via TOPICAL
  Administered 2018-03-27: 1 via TOPICAL
  Administered 2018-03-28: 11:00:00 via TOPICAL
  Filled 2018-03-24 (×2): qty 1

## 2018-03-24 MED ORDER — OXYCODONE HCL 5 MG/5ML PO SOLN
5.0000 mg | Freq: Four times a day (QID) | ORAL | Status: DC | PRN
  Administered 2018-03-24 – 2018-03-25 (×4): 5 mg via ORAL
  Filled 2018-03-24 (×4): qty 5

## 2018-03-24 MED ORDER — SODIUM CHLORIDE 0.9% FLUSH: 10.0000 mL | INTRAVENOUS | Status: DC | PRN

## 2018-03-24 NOTE — Consult Note (Signed)
[facility] Nurse wound consult note Reason for Consult:Moisture associated skin damage on the buttocks and in the gluteal cleft (Chronic moisture issue)  Not pressure Wound type:Moisture associated skin damage, specifically intertriginous dermatitis Pressure Injury POA: NA Measurement:two partial thickness areas on the buttocks measuring 1cm x 2cm x 0.1cm each, red, dry.  Intertriginous maceration and linear skin splitting measuring 3cm x 0.1cm x 0.1cm (pink, moist) Wound FBX:UXYB, and both moist (Intragluteal cleft) and dry (buttocks) Drainage (amount, consistency, odor) scant serous Periwound: intact Dressing procedure/placement/frequency: I will provide a pressure redistribution chair pad for her use OOB in chair, Gerhart's butt cream to the buttock lesions and the house antimicrobial textile for use in the gluteal cleft. Turning side to side while in bed (she is on a mattress replacement with low air loss feature while in ICU) is an added adjunct to microclimate management while in ICU.  [place] nursing team will not follow, but will remain available to this patient, the nursing and medical teams.  Please re-consult if needed. Thanks, Maudie Flakes, MSN, RN, [place], Arther Abbott  Pager# [phone]

## 2018-03-24 NOTE — Evaluation (Signed)
Occupational Therapy Evaluation Patient Details Name: [name] MRN: [id number] DOB: Aug 08, 1935 Today's Date: 03/24/2018    History of Present Illness 36yoF with hx of HTN, DM, HLD, TAVR 07/2017, arthritis, presented with perforated DU - now s/p exlap/Graham patch, repair of perforated DU 03/22/2018   Clinical Impression   PTA, pt was living with her sister and was independent. Pt currently requires Min A for UB ADLs, Max A for LB ADLs, and Min A +2 for functional mobility. Pt with signfiicant pain and decreased balance impacting her safety and functional performance. Pt would benefit from further acute OT to facilitate safe dc. Recommend dc to SNF for further OT to optimize safety, independence with ADLs, and return to PLOF.      Follow Up Recommendations  SNF;Supervision/Assistance - 24 hour    Equipment Recommendations  Other (comment)(Defer to next venue)    Recommendations for Other Services PT consult     Precautions / Restrictions Precautions Precautions: Fall Restrictions Weight Bearing Restrictions: No      Mobility Bed Mobility Overal bed mobility: Needs Assistance Bed Mobility: Rolling;Supine to Sit Rolling: Min assist;+2 for physical assistance   Supine to sit: Mod assist;+2 for physical assistance     General bed mobility comments: min A for come into L sidelying, modA for bringing trunk to upright  Transfers Overall transfer level: Needs assistance Equipment used: 2 person hand held assist Transfers: Sit to/from Stand Sit to Stand: Min assist;+2 physical assistance         General transfer comment: minAx2 for steadying in upright.     Balance Overall balance assessment: Needs assistance Sitting-balance support: Feet supported;No upper extremity supported Sitting balance-Leahy Scale: Fair     Standing balance support: During functional activity;Bilateral upper extremity supported Standing balance-Leahy Scale: Poor Standing balance comment:  requires UE assist                           ADL either performed or assessed with clinical judgement   ADL Overall ADL's : Needs assistance/impaired Eating/Feeding: NPO   Grooming: Supervision/safety;Set up;Sitting   Upper Body Bathing: Minimal assistance;Sitting   Lower Body Bathing: Moderate assistance;Sit to/from stand   Upper Body Dressing : Minimal assistance;Sitting   Lower Body Dressing: Maximal assistance;Sit to/from stand   Toilet Transfer: Minimal assistance;Ambulation(two hand held A; short distance to recliner)           Functional mobility during ADLs: Minimal assistance(two hand held A) General ADL Comments: Pt with limited functional performance due to pain and poor balance     Vision Baseline Vision/History: Wears glasses Wears Glasses: At all times Patient Visual Report: No change from baseline       Perception     Praxis      Pertinent Vitals/Pain Pain Assessment: 0-10 Pain Score: 7  Pain Location: R UQ pain Pain Descriptors / Indicators: Grimacing;Guarding;Sore Pain Intervention(s): Monitored during session;Limited activity within patient's tolerance;Repositioned     Hand Dominance Right   Extremity/Trunk Assessment Upper Extremity Assessment Upper Extremity Assessment: Overall WFL for tasks assessed   Lower Extremity Assessment Lower Extremity Assessment: Defer to PT evaluation RLE Deficits / Details: decreased ROM in hips, knees and ankles secondary to OA RLE Sensation: history of peripheral neuropathy RLE Coordination: decreased fine motor LLE Deficits / Details: decreased ROM in hips, knees and ankles secondary to OA LLE Sensation: history of peripheral neuropathy LLE Coordination: decreased fine motor   Cervical / Trunk Assessment Cervical / Trunk Assessment:  Other exceptions Cervical / Trunk Exceptions: Increased body habitus and abdominal pain   Communication Communication Communication: No difficulties    Cognition Arousal/Alertness: Awake/alert Behavior During Therapy: WFL for tasks assessed/performed Overall Cognitive Status: Within Functional Limits for tasks assessed                                     General Comments  Pt niece present throughout session, assists with home set up information, Wound care nurse present for rolling over in bed and assessed buttock wound. Ordering chair pad for pressure redistribution, Pt on 2L O2 via [place] on entry, SaO2 96%O2, mobilized on RA and unable to maintain SaO2 >90% once seated in recliner, supplemental O2 replaced. HR max with mobilization 120 bpm    Exercises     Shoulder Instructions      Home Living Family/patient expects to be discharged to:: Private residence Living Arrangements: Other relatives(Sister (who has dementia))   Type of Home: House Home Access: Stairs to enter [employer] of Steps: 3 Entrance Stairs-Rails: Left Home Layout: Two level;Able to live on main level with bedroom/bathroom     Bathroom Shower/Tub: Occupational psychologist: Standard     Home Equipment: Environmental consultant - 4 wheels;Shower seat - built in          Prior Functioning/Environment Level of Independence: Independent with assistive device(s)        Comments: Uses rollator. ADLs and simple cooking. Stopped driving due to neuropathy        OT Problem List: Decreased strength;Decreased range of motion;Decreased activity tolerance;Impaired balance (sitting and/or standing);Decreased safety awareness;Decreased knowledge of use of DME or AE;Decreased knowledge of precautions;Pain      OT Treatment/Interventions: Self-care/ADL training;Therapeutic exercise;Energy conservation;DME and/or AE instruction;Therapeutic activities;Patient/family education    OT Goals(Current goals can be found in the care plan section) Acute Rehab OT Goals Patient Stated Goal: have less pain OT Goal Formulation: With patient Time For Goal  Achievement: 04/07/18 Potential to Achieve Goals: Good  OT Frequency: Min 2X/week   Barriers to D/C:            Co-evaluation PT/OT/SLP Co-Evaluation/Treatment: Yes Reason for Co-Treatment: Complexity of the patient's impairments (multi-system involvement) PT goals addressed during session: Mobility/safety with mobility;Balance;Strengthening/ROM OT goals addressed during session: ADL's and self-care      AM-PAC OT "6 Clicks" Daily Activity     Outcome Measure Help from another person eating meals?: None Help from another person taking care of personal grooming?: None Help from another person toileting, which includes using toliet, bedpan, or urinal?: A Lot Help from another person bathing (including washing, rinsing, drying)?: A Lot Help from another person to put on and taking off regular upper body clothing?: A Little Help from another person to put on and taking off regular lower body clothing?: A Lot 6 Click Score: 17   End of Session Equipment Utilized During Treatment: Oxygen Nurse Communication: Mobility status  Activity Tolerance: Patient tolerated treatment well;Patient limited by pain Patient left: in chair;with call bell/phone within reach  OT Visit Diagnosis: Unsteadiness on feet (R26.81);Other abnormalities of gait and mobility (R26.89);Muscle weakness (generalized) (M62.81);Pain Pain - part of body: (Abdomen)                Time: 3710-6269 OT Time Calculation (min): 32 min Charges:  OT General Charges $OT Visit: 1 Visit OT Evaluation $OT Eval Moderate Complexity: 1 Mod  Clariza Sickman  Sasha Rogel MSOT, OTR/L Acute Rehab Pager: [phone] Office: [place] 03/24/2018, 5:20 PM

## 2018-03-24 NOTE — Evaluation (Signed)
Physical Therapy Evaluation Patient Details Name: [name] MRN: [id number] DOB: February 25, 1936 Today's Date: 03/24/2018   History of Present Illness  26yoF with hx of HTN, DM, HLD, TAVR 07/2017, arthritis, presented with perforated DU - now s/p exlap/Graham patch, repair of perforated DU 03/22/2018  Clinical Impression  PTA pt living with sister in single story home with 3 steps to enter. Pt independent with ambulation, ADLs and iADLs, driving. Pt currently limited in safe mobility by surgical site pain and associated decreased ROM and strength. Pt requires modA for bed mobility, minAx2 for transfers and ambulation of 3 feet. PT recommending SNF level rehab at discharge due to decreased mobility due to pain, as pain is controlled and mobility increases d/c home with HHPT may be possible. PT will continue to follow and assess acutely.     Follow Up Recommendations SNF    Equipment Recommendations  Other (comment)(TBD at next venue)       Precautions / Restrictions Precautions Precautions: Fall Restrictions Weight Bearing Restrictions: No      Mobility  Bed Mobility Overal bed mobility: Needs Assistance Bed Mobility: Rolling;Supine to Sit Rolling: Min assist;+2 for physical assistance   Supine to sit: Mod assist;+2 for physical assistance     General bed mobility comments: min A for come into L sidelying, modA for bringing trunk to upright  Transfers Overall transfer level: Needs assistance Equipment used: 2 person hand held assist Transfers: Sit to/from Stand Sit to Stand: Min assist;+2 physical assistance         General transfer comment: minAx2 for steadying in upright.   Ambulation/Gait Ambulation/Gait assistance: Min assist;+2 physical assistance Gait Distance (Feet): 3 Feet Assistive device: 2 person hand held assist Gait Pattern/deviations: Step-through pattern;Decreased step length - right;Decreased step length - left;Shuffle;Antalgic;Trunk flexed Gait  velocity: slowed Gait velocity interpretation: <1.31 ft/sec, indicative of household ambulator General Gait Details: minAx2 for steadying with slow shuffling steps from bed to recliner, vc for upright posture, noted increased joint cavitation and creptius in spine and LE with movement       Balance Overall balance assessment: Needs assistance Sitting-balance support: Feet supported;No upper extremity supported Sitting balance-Leahy Scale: Fair     Standing balance support: During functional activity;Bilateral upper extremity supported Standing balance-Leahy Scale: Poor Standing balance comment: requires UE assist                             Pertinent Vitals/Pain Pain Assessment: 0-10 Pain Score: 7  Pain Location: R UQ pain Pain Descriptors / Indicators: Grimacing;Guarding;Sore Pain Intervention(s): Limited activity within patient's tolerance;Monitored during session;Repositioned    Home Living Family/patient expects to be discharged to:: Private residence     Type of Home: House Home Access: Stairs to enter Entrance Stairs-Rails: Left Entrance Stairs-Number of Steps: 3 Home Layout: Two level;Able to live on main level with bedroom/bathroom Home Equipment: Walker - 4 wheels;Shower seat - built in      Prior Function Level of Independence: Independent with assistive device(s)         Comments: Uses rollator. ADLs and simple cooking. Stopped driving due to neuropathy     Hand Dominance   Dominant Hand: Right    Extremity/Trunk Assessment   Upper Extremity Assessment Upper Extremity Assessment: Defer to OT evaluation    Lower Extremity Assessment Lower Extremity Assessment: RLE deficits/detail;LLE deficits/detail RLE Deficits / Details: decreased ROM in hips, knees and ankles secondary to OA RLE Sensation: history of peripheral neuropathy RLE  Coordination: decreased fine motor LLE Deficits / Details: decreased ROM in hips, knees and ankles secondary  to OA LLE Sensation: history of peripheral neuropathy LLE Coordination: decreased fine motor       Communication   Communication: No difficulties  Cognition Arousal/Alertness: Awake/alert Behavior During Therapy: WFL for tasks assessed/performed Overall Cognitive Status: Within Functional Limits for tasks assessed                                        General Comments General comments (skin integrity, edema, etc.): Pt niece present throughout session, assists with home set up information, Wound care nurse present for rolling over in bed and assessed buttock wound. Ordering chair pad for pressure redistribution, Pt on 2L O2 via [place] on entry, SaO2 96%O2, mobilized on RA and unable to maintain SaO2 >90% once seated in recliner, supplemental O2 replaced. HR max with mobilization 120 bpm        Assessment/Plan    PT Assessment Patient needs continued PT services  PT Problem List Decreased strength;Decreased activity tolerance;Decreased range of motion;Decreased balance;Decreased mobility;Impaired sensation;Pain       PT Treatment Interventions DME instruction;Gait training;Stair training;Functional mobility training;Therapeutic activities;Therapeutic exercise;Balance training;Cognitive remediation;Patient/family education    PT Goals (Current goals can be found in the Care Plan section)  Acute Rehab PT Goals Patient Stated Goal: have less pain PT Goal Formulation: With patient/family Time For Goal Achievement: 04/07/18 Potential to Achieve Goals: Fair    Frequency Min 3X/week   Barriers to discharge        Co-evaluation PT/OT/SLP Co-Evaluation/Treatment: Yes Reason for Co-Treatment: Complexity of the patient's impairments (multi-system involvement);For patient/therapist safety PT goals addressed during session: Mobility/safety with mobility;Balance;Strengthening/ROM         AM-PAC PT "6 Clicks" Mobility  Outcome Measure Help needed turning from your back  to your side while in a flat bed without using bedrails?: A Little Help needed moving from lying on your back to sitting on the side of a flat bed without using bedrails?: A Lot Help needed moving to and from a bed to a chair (including a wheelchair)?: A Little Help needed standing up from a chair using your arms (e.g., wheelchair or bedside chair)?: A Little Help needed to walk in hospital room?: A Lot Help needed climbing 3-5 steps with a railing? : Total 6 Click Score: 14    End of Session Equipment Utilized During Treatment: Oxygen Activity Tolerance: Patient limited by pain Patient left: in chair;with call bell/phone within reach;with chair alarm set;with family/visitor present Nurse Communication: Mobility status(return to bed with decreased sensation in buttock ) PT Visit Diagnosis: Unsteadiness on feet (R26.81);Other abnormalities of gait and mobility (R26.89);Muscle weakness (generalized) (M62.81);Difficulty in walking, not elsewhere classified (R26.2);Pain Pain - part of body: (R UQ)    Time: 1497-0263 PT Time Calculation (min) (ACUTE ONLY): 32 min   Charges:   PT Evaluation $PT Eval Moderate Complexity: 1 Mod          Katrenia Alkins B. Migdalia Dk PT, DPT Acute Rehabilitation Services Pager [phone] Office [phone]   [place] 03/24/2018, 2:19 PM

## 2018-03-24 NOTE — Anesthesia Postprocedure Evaluation (Signed)
Anesthesia Post Note  Patient: [name]  Procedure(s) Performed: grahams pouch, repair of bowel perforation (N/A Abdomen)     Patient location during evaluation: PACU Anesthesia Type: General Level of consciousness: sedated and patient cooperative Pain management: pain level controlled Vital Signs Assessment: post-procedure vital signs reviewed and stable Respiratory status: spontaneous breathing Cardiovascular status: stable Anesthetic complications: no    Last Vitals:  Vitals:   03/24/18 1900 03/24/18 2051  BP: (!) 123/59   Pulse: 95   Resp: 16   Temp:  36.9 C  SpO2: 98%     Last Pain:  Vitals:   03/24/18 2051  TempSrc: Oral  PainSc:                  Nolon Nations

## 2018-03-24 NOTE — Progress Notes (Signed)
  Progress Note: General Surgery Service   Assessment/Plan: Active Problems:   Perforated duodenal ulcer ([place])  s/p Procedure(s): grahams pouch, repair of bowel perforation 03/22/2018 Remove NG, remove foley -swallow tomorrow -transfer to floor w tele -continue PPI  DM Continue q4h SSI  HTN - controlled Continue PRNS, no lasix today Likely restart home meds after swallow test   LOS: 1 day  Chief Complaint/Subjective: Some incisional pain, no nausea  Objective: Vital signs in last 24 hours: Temp:  [98 F (36.7 C)-101.8 F (38.8 C)] 98.6 F (37 C) (01/19 0400) Pulse Rate:  [55-120] 104 (01/19 0500) Resp:  [12-28] 18 (01/19 0500) BP: (92-128)/(42-70) 115/50 (01/19 0500) SpO2:  [95 %-100 %] 98 % (01/19 0500) Last BM Date: 03/21/18  Intake/Output from previous day: 01/18 0701 - 01/19 0700 In: 2290.2 [I.V.:1162.9; NG/GT:10; IV Piggyback:1117.3] Out: 2202 [Urine:1980; Drains:222] Intake/Output this shift: No intake/output data recorded.  Lungs: nonlabored breathing  Cardiovascular: RRR  Abd: soft, incision with dry bandage, drain yellow output  Extremities: no edema  Neuro: AOx4  Lab Results: CBC  Recent Labs    03/23/18 0516 03/24/18 0428  WBC 15.8* 13.0*  HGB 11.4* 10.6*  HCT 35.0* 33.6*  PLT 194 166   BMET Recent Labs    03/23/18 0516 03/24/18 0428  NA 137 139  K 4.5 4.3  CL 104 107  CO2 23 24  GLUCOSE 154* 153*  BUN 35* 23  CREATININE 1.53* 1.34*  CALCIUM 8.0* 8.1*   PT/INR No results for input(s): LABPROT, INR in the last 72 hours. ABG No results for input(s): PHART, HCO3 in the last 72 hours.  Invalid input(s): PCO2, PO2  Studies/Results:  Anti-infectives: Anti-infectives (From admission, onward)   Start     Dose/Rate Route Frequency Ordered Stop   03/23/18 0400  piperacillin-tazobactam (ZOSYN) IVPB 3.375 g     3.375 g 12.5 mL/hr over 240 Minutes Intravenous Every 8 hours 03/23/18 0212     03/22/18 2315  cefoTEtan in Dextrose  5% (CEFOTAN) IVPB 2 g     2 g 100 mL/hr over 30 Minutes Intravenous To Surgery 03/22/18 2314 03/23/18 2315   03/22/18 1900  piperacillin-tazobactam (ZOSYN) IVPB 3.375 g     3.375 g 100 mL/hr over 30 Minutes Intravenous  Once 03/22/18 1854 03/22/18 2031      Medications: Scheduled Meds: . chlorhexidine  15 mL Mouth Rinse BID  . enoxaparin (LOVENOX) injection  40 mg Subcutaneous Daily  . insulin aspart  0-20 Units Subcutaneous Q4H  . mouth rinse  15 mL Mouth Rinse q12n4p  . pantoprazole (PROTONIX) IV  40 mg Intravenous Q12H   Continuous Infusions: . sodium chloride    . sodium chloride Stopped (03/23/18 2115)  . dextrose 5 % and 0.45% NaCl 50 mL/hr at 03/24/18 0600  . piperacillin-tazobactam (ZOSYN)  IV 12.5 mL/hr at 03/24/18 0600   PRN Meds:.sodium chloride, acetaminophen **OR** acetaminophen, diphenhydrAMINE **OR** diphenhydrAMINE, metoprolol tartrate, morphine injection, ondansetron **OR** ondansetron (ZOFRAN) IV, oxyCODONE  Mickeal Skinner, MD [facility]

## 2018-03-25 ENCOUNTER — Inpatient Hospital Stay (HOSPITAL_COMMUNITY): Payer: Medicare Other

## 2018-03-25 ENCOUNTER — Encounter (HOSPITAL_COMMUNITY): Payer: Self-pay | Admitting: General Surgery

## 2018-03-25 LAB — COMPREHENSIVE METABOLIC PANEL
ALT: 18 U/L (ref 0–44)
AST: 12 U/L — AB (ref 15–41)
Albumin: 2.2 g/dL — ABNORMAL LOW (ref 3.5–5.0)
Alkaline Phosphatase: 64 U/L (ref 38–126)
Anion gap: 10 (ref 5–15)
BUN: 16 mg/dL (ref 8–23)
CO2: 24 mmol/L (ref 22–32)
Calcium: 8.3 mg/dL — ABNORMAL LOW (ref 8.9–10.3)
Chloride: 105 mmol/L (ref 98–111)
Creatinine, Ser: 1.02 mg/dL — ABNORMAL HIGH (ref 0.44–1.00)
GFR calc Af Amer: >60 mL/min (ref >60)
GFR calc non Af Amer: 52 mL/min — ABNORMAL LOW (ref >60)
Glucose, Bld: 149 mg/dL — ABNORMAL HIGH (ref 70–99)
Potassium: 4.1 mmol/L (ref 3.5–5.1)
Sodium: 139 mmol/L (ref 135–145)
Total Bilirubin: 1.4 mg/dL — ABNORMAL HIGH (ref 0.3–1.2)
Total Protein: 5.7 g/dL — ABNORMAL LOW (ref 6.5–8.1)

## 2018-03-25 LAB — GLUCOSE, CAPILLARY
GLUCOSE-CAPILLARY: 147 mg/dL — AB (ref 70–99)
Glucose-Capillary: 129 mg/dL — ABNORMAL HIGH (ref 70–99)
Glucose-Capillary: 141 mg/dL — ABNORMAL HIGH (ref 70–99)
Glucose-Capillary: 176 mg/dL — ABNORMAL HIGH (ref 70–99)
Glucose-Capillary: 134 mg/dL — ABNORMAL HIGH (ref 70–99)

## 2018-03-25 LAB — CBC
HCT: 32.8 % — ABNORMAL LOW (ref 36.0–46.0)
Hemoglobin: 10.2 g/dL — ABNORMAL LOW (ref 12.0–15.0)
MCH: 29.5 pg (ref 26.0–34.0)
MCHC: 31.1 g/dL (ref 30.0–36.0)
MCV: 94.8 fL (ref 80.0–100.0)
Platelets: 185 10*3/uL (ref 150–400)
RBC: 3.46 MIL/uL — ABNORMAL LOW (ref 3.87–5.11)
RDW: 14.9 % (ref 11.5–15.5)
WBC: 10.5 10*3/uL (ref 4.0–10.5)
nRBC: 0 % (ref 0.0–0.2)

## 2018-03-25 MED ORDER — IOHEXOL 300 MG/ML  SOLN
150.0000 mL | Freq: Once | INTRAMUSCULAR | Status: AC | PRN
  Administered 2018-03-25: 150 mL via ORAL

## 2018-03-25 NOTE — Progress Notes (Signed)
Pt transfer from 26M. Pt alert and oriented x4. Oriented to room and call bell.

## 2018-03-25 NOTE — Plan of Care (Signed)
  Problem: Activity: Goal: Risk for activity intolerance will decrease Outcome: Progressing   Problem: Elimination: Goal: Will not experience complications related to urinary retention Outcome: Progressing   Pt was able to transfer to the [facility] with one assist during the shift.

## 2018-03-25 NOTE — Plan of Care (Signed)
  Problem: Clinical Measurements: Goal: Respiratory complications will improve Outcome: Progressing  Pt was given an incentive spirometer and educated on how to use it to help remind her to take deep breaths to help prevent developing pneumonia while in the hospital during this shift.

## 2018-03-25 NOTE — Plan of Care (Signed)
  Problem: Pain Managment: Goal: General experience of comfort will improve Outcome: Progressing   Problem: Safety: Goal: Ability to remain free from injury will improve Outcome: Progressing   Problem: Skin Integrity: Goal: Risk for impaired skin integrity will decrease Outcome: Progressing   

## 2018-03-25 NOTE — NC FL2 (Signed)
[place] LEVEL OF CARE SCREENING TOOL     IDENTIFICATION  Patient Name: [name] Birthdate: January 06, 1936 Sex: female Admission Date (Current Location): 03/22/2018  [facility] and [place] Number:  Herbalist and Address:  [facility], [place] [address], [place], [place] [address]      Provider Number: [id number]  Attending Physician Name and Address:  Edison Pace, Md, MD  Relative Name and Phone Number:       Current Level of Care: Hospital Recommended Level of Care: [place] Prior Approval Number:    Date Approved/Denied:   PASRR Number: [id number]  Discharge Plan: SNF    Current Diagnoses: Patient Active Problem List   Diagnosis Date Noted  . Perforated duodenal ulcer ([place]) 03/23/2018  . Severe aortic stenosis 07/17/2017  . 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 Mitral stenosis 10/02/2016  . Symptomatic bradycardia 04/21/2016  . Bradycardia 04/20/2016  . Morbid obesity ([place]) 04/20/2016  . Essential hypertension 04/20/2016  . Insulin dependent diabetes mellitus ([place]) 04/20/2016  . History of ovarian cancer 04/20/2016  . Aortic stenosis 04/20/2016  . Spinal stenosis 04/20/2016  . Complete heart block ([place]) 04/20/2016  . History of breast cancer 12/27/2010    Orientation RESPIRATION BLADDER Height & Weight     Self, Situation, Time, Place  O2(Nasal Cannula 2L) Continent Weight: 268 lb 15.4 oz (122 kg) Height:  5\' 7"  (170.2 cm)  BEHAVIORAL SYMPTOMS/MOOD NEUROLOGICAL BOWEL NUTRITION STATUS      Continent Diet  AMBULATORY STATUS COMMUNICATION OF NEEDS Skin   Limited Assist Verbally PU Stage and Appropriate Care, Other (Comment)(right buttocks, foam dressing. Closed incision on abdomen, guaze dressing)   PU Stage 2 Dressing: (change every three days)                   Personal Care Assistance Level of Assistance  Dressing, Feeding, Bathing Bathing Assistance: Limited  assistance Feeding assistance: Independent Dressing Assistance: Limited assistance     Functional Limitations Info  Sight, Hearing, Speech Sight Info: Adequate Hearing Info: Adequate Speech Info: Adequate    SPECIAL CARE FACTORS FREQUENCY  PT (By licensed PT), OT (By licensed OT)     PT Frequency: 2x OT Frequency: 2x            Contractures Contractures Info: Not present    Additional Factors Info  Code Status, Allergies Code Status Info: Full Code Allergies Info: Tape           Current Medications (03/25/2018):  This is the current hospital active medication list Current Facility-Administered Medications  Medication Dose Route Frequency Provider Last Rate Last Dose  . 0.9 %  sodium chloride infusion   Intravenous PRN Kinsinger, Arta Bruce, MD   Stopped at 03/23/18 2115  . acetaminophen (TYLENOL) tablet 650 mg  650 mg Oral Q6H PRN Kinsinger, Arta Bruce, MD   650 mg at 03/25/18 1218   Or  . acetaminophen (TYLENOL) suppository 650 mg  650 mg Rectal Q6H PRN Kinsinger, Arta Bruce, MD   650 mg at 03/23/18 2305  . chlorhexidine (PERIDEX) 0.12 % solution 15 mL  15 mL Mouth Rinse BID Kinsinger, Arta Bruce, MD   15 mL at 03/25/18 0849  . dextrose 5 %-0.45 % sodium chloride infusion   Intravenous Continuous Kinsinger, Arta Bruce, MD 50 mL/hr at 03/25/18 0400    . diphenhydrAMINE (BENADRYL) 12.5 MG/5ML elixir 12.5 mg  12.5 mg Oral Q6H PRN Kinsinger, Arta Bruce, MD  Or  . diphenhydrAMINE (BENADRYL) injection 12.5 mg  12.5 mg Intravenous Q6H PRN Kinsinger, Arta Bruce, MD      . enoxaparin (LOVENOX) injection 40 mg  40 mg Subcutaneous Daily Kinsinger, Arta Bruce, MD   40 mg at 03/25/18 0855  . Gerhardt's butt cream   Topical TID Ileana Roup, MD      . insulin aspart (novoLOG) injection 0-20 Units  0-20 Units Subcutaneous Q4H Kinsinger, Arta Bruce, MD   5 Units at 03/25/18 1214  . MEDLINE mouth rinse  15 mL Mouth Rinse q12n4p Kinsinger, Arta Bruce, MD   15 mL at 03/25/18  1148  . metoprolol tartrate (LOPRESSOR) injection 5 mg  5 mg Intravenous Q6H PRN Kinsinger, Arta Bruce, MD      . morphine 2 MG/ML injection 2 mg  2 mg Intravenous Q2H PRN Kinsinger, Arta Bruce, MD   2 mg at 03/25/18 1401  . ondansetron (ZOFRAN-ODT) disintegrating tablet 4 mg  4 mg Oral Q6H PRN Kinsinger, Arta Bruce, MD       Or  . ondansetron [facility]) injection 4 mg  4 mg Intravenous Q6H PRN Kinsinger, Arta Bruce, MD      . oxyCODONE (ROXICODONE) 5 MG/5ML solution 5 mg  5 mg Oral Q6H PRN Kinsinger, Arta Bruce, MD   5 mg at 03/25/18 [phone]  . pantoprazole (PROTONIX) injection 40 mg  40 mg Intravenous Q12H Kinsinger, Arta Bruce, MD   40 mg at 03/25/18 0855  . piperacillin-tazobactam (ZOSYN) IVPB 3.375 g  3.375 g Intravenous Q8H Kinsinger, Arta Bruce, MD 12.5 mL/hr at 03/25/18 1410 3.375 g at 03/25/18 1410  . sodium chloride flush (NS) 0.9 % injection 10-40 mL  10-40 mL Intracatheter PRN Kinsinger, Arta Bruce, MD         Discharge Medications: Please see discharge summary for a list of discharge medications.  Relevant Imaging Results:  Relevant Lab Results:   Additional Information SSN: [id number]  Eileen Stanford, LCSW

## 2018-03-25 NOTE — Progress Notes (Signed)
  Progress Note: General Surgery Service   Assessment/Plan: Active Problems:   Perforated duodenal ulcer ([place])  s/p Procedure(s): grahams pouch, repair of bowel perforation 03/22/2018 -swallow today - UGI with gastrograffin contrast -transfer to floor w tele -continue PPI  DM Continue q4h SSI  HTN - controlled Continue PRNS, no lasix today Likely restart home meds after swallow test   LOS: 2 days  Chief Complaint/Subjective: Some incisional pain, no nausea, no vomiting, feeling better each day  Objective: Vital signs in last 24 hours: Temp:  [98.1 F (36.7 C)-100 F (37.8 C)] 98.7 F (37.1 C) (01/20 0700) Pulse Rate:  [88-106] 97 (01/20 0600) Resp:  [12-26] 16 (01/20 0600) BP: (101-140)/(43-73) 136/67 (01/20 0600) SpO2:  [90 %-100 %] 97 % (01/20 0600) Last BM Date: 03/21/18  Intake/Output from previous day: 01/19 0701 - 01/20 0700 In: 1096.1 [I.V.:943.3; IV Piggyback:152.8] Out: 1205 [Urine:1100; Drains:105] Intake/Output this shift: No intake/output data recorded.  Lungs: nonlabored breathing  Cardiovascular: RRR  Abd: soft, incision c/d/i without erythema, drain yellow output  Extremities: no edema  Neuro: AOx4  Lab Results: CBC  Recent Labs    03/24/18 0428 03/25/18 0307  WBC 13.0* 10.5  HGB 10.6* 10.2*  HCT 33.6* 32.8*  PLT 166 185   BMET Recent Labs    03/24/18 0428 03/25/18 0307  NA 139 139  K 4.3 4.1  CL 107 105  CO2 24 24  GLUCOSE 153* 149*  BUN 23 16  CREATININE 1.34* 1.02*  CALCIUM 8.1* 8.3*   PT/INR No results for input(s): LABPROT, INR in the last 72 hours. ABG No results for input(s): PHART, HCO3 in the last 72 hours.  Invalid input(s): PCO2, PO2  Studies/Results:  Anti-infectives: Anti-infectives (From admission, onward)   Start     Dose/Rate Route Frequency Ordered Stop   03/23/18 0400  piperacillin-tazobactam (ZOSYN) IVPB 3.375 g     3.375 g 12.5 mL/hr over 240 Minutes Intravenous Every 8 hours 03/23/18 0212       03/22/18 2315  cefoTEtan in Dextrose 5% (CEFOTAN) IVPB 2 g     2 g 100 mL/hr over 30 Minutes Intravenous To Surgery 03/22/18 2314 03/23/18 2315   03/22/18 1900  piperacillin-tazobactam (ZOSYN) IVPB 3.375 g     3.375 g 100 mL/hr over 30 Minutes Intravenous  Once 03/22/18 1854 03/22/18 2031      Medications: Scheduled Meds: . chlorhexidine  15 mL Mouth Rinse BID  . enoxaparin (LOVENOX) injection  40 mg Subcutaneous Daily  . Gerhardt's butt cream   Topical TID  . insulin aspart  0-20 Units Subcutaneous Q4H  . mouth rinse  15 mL Mouth Rinse q12n4p  . pantoprazole (PROTONIX) IV  40 mg Intravenous Q12H   Continuous Infusions: . sodium chloride Stopped (03/23/18 2115)  . dextrose 5 % and 0.45% NaCl 50 mL/hr at 03/25/18 0400  . piperacillin-tazobactam (ZOSYN)  IV 3.375 g (03/25/18 0519)   PRN Meds:.sodium chloride, acetaminophen **OR** acetaminophen, diphenhydrAMINE **OR** diphenhydrAMINE, metoprolol tartrate, morphine injection, ondansetron **OR** ondansetron (ZOFRAN) IV, oxyCODONE, sodium chloride flush  Ileana Roup, MD [facility]

## 2018-03-26 LAB — COMPREHENSIVE METABOLIC PANEL
ALT: 17 U/L (ref 0–44)
AST: 15 U/L (ref 15–41)
Albumin: 2.2 g/dL — ABNORMAL LOW (ref 3.5–5.0)
Alkaline Phosphatase: 95 U/L (ref 38–126)
Anion gap: 6 (ref 5–15)
BUN: 10 mg/dL (ref 8–23)
CO2: 27 mmol/L (ref 22–32)
Calcium: 8.4 mg/dL — ABNORMAL LOW (ref 8.9–10.3)
Chloride: 105 mmol/L (ref 98–111)
Creatinine, Ser: 0.9 mg/dL (ref 0.44–1.00)
GFR calc Af Amer: >60 mL/min (ref >60)
GFR calc non Af Amer: >60 mL/min (ref >60)
Glucose, Bld: 155 mg/dL — ABNORMAL HIGH (ref 70–99)
Potassium: 3.8 mmol/L (ref 3.5–5.1)
Sodium: 138 mmol/L (ref 135–145)
Total Bilirubin: 1.1 mg/dL (ref 0.3–1.2)
Total Protein: 5.8 g/dL — ABNORMAL LOW (ref 6.5–8.1)

## 2018-03-26 LAB — CBC
HCT: 30.4 % — ABNORMAL LOW (ref 36.0–46.0)
Hemoglobin: 9.3 g/dL — ABNORMAL LOW (ref 12.0–15.0)
MCH: 29.1 pg (ref 26.0–34.0)
MCHC: 30.6 g/dL (ref 30.0–36.0)
MCV: 95 fL (ref 80.0–100.0)
Platelets: 186 10*3/uL (ref 150–400)
RBC: 3.2 MIL/uL — ABNORMAL LOW (ref 3.87–5.11)
RDW: 14.7 % (ref 11.5–15.5)
WBC: 7.2 10*3/uL (ref 4.0–10.5)
nRBC: 0 % (ref 0.0–0.2)

## 2018-03-26 LAB — GLUCOSE, CAPILLARY
Glucose-Capillary: 118 mg/dL — ABNORMAL HIGH (ref 70–99)
Glucose-Capillary: 139 mg/dL — ABNORMAL HIGH (ref 70–99)
Glucose-Capillary: 147 mg/dL — ABNORMAL HIGH (ref 70–99)
Glucose-Capillary: 150 mg/dL — ABNORMAL HIGH (ref 70–99)
Glucose-Capillary: 161 mg/dL — ABNORMAL HIGH (ref 70–99)
Glucose-Capillary: 206 mg/dL — ABNORMAL HIGH (ref 70–99)

## 2018-03-26 MED ORDER — OXYCODONE HCL 5 MG/5ML PO SOLN
5.0000 mg | Freq: Four times a day (QID) | ORAL | Status: DC | PRN
  Administered 2018-03-26 (×2): 5 mg via ORAL
  Filled 2018-03-26 (×2): qty 5

## 2018-03-26 MED ORDER — ATORVASTATIN CALCIUM 10 MG PO TABS
20.0000 mg | ORAL_TABLET | Freq: Every day | ORAL | Status: DC
  Administered 2018-03-26 – 2018-03-27 (×2): 20 mg via ORAL
  Filled 2018-03-26 (×2): qty 2

## 2018-03-26 MED ORDER — MUPIROCIN 2 % EX OINT
1.0000 "application " | TOPICAL_OINTMENT | Freq: Two times a day (BID) | CUTANEOUS | Status: DC
  Administered 2018-03-26 – 2018-03-28 (×5): 1 via NASAL
  Filled 2018-03-26: qty 22

## 2018-03-26 MED ORDER — LOSARTAN POTASSIUM 50 MG PO TABS
50.0000 mg | ORAL_TABLET | Freq: Every day | ORAL | Status: DC
  Administered 2018-03-26 – 2018-03-28 (×3): 50 mg via ORAL
  Filled 2018-03-26 (×3): qty 1

## 2018-03-26 MED ORDER — INSULIN ASPART 100 UNIT/ML ~~LOC~~ SOLN
0.0000 [IU] | Freq: Three times a day (TID) | SUBCUTANEOUS | Status: DC
  Administered 2018-03-26: 3 [IU] via SUBCUTANEOUS
  Administered 2018-03-27: 4 [IU] via SUBCUTANEOUS
  Administered 2018-03-27: 3 [IU] via SUBCUTANEOUS
  Administered 2018-03-27: 4 [IU] via SUBCUTANEOUS
  Administered 2018-03-27 – 2018-03-28 (×3): 3 [IU] via SUBCUTANEOUS

## 2018-03-26 MED ORDER — ENSURE ENLIVE PO LIQD
237.0000 mL | Freq: Two times a day (BID) | ORAL | Status: DC
  Administered 2018-03-26 – 2018-03-28 (×4): 237 mL via ORAL

## 2018-03-26 MED ORDER — METOPROLOL TARTRATE 12.5 MG HALF TABLET
12.5000 mg | ORAL_TABLET | Freq: Two times a day (BID) | ORAL | Status: DC
  Administered 2018-03-26 – 2018-03-28 (×5): 12.5 mg via ORAL
  Filled 2018-03-26 (×5): qty 1

## 2018-03-26 MED ORDER — CHLORHEXIDINE GLUCONATE CLOTH 2 % EX PADS
6.0000 | MEDICATED_PAD | Freq: Every day | CUTANEOUS | Status: DC
  Administered 2018-03-26 – 2018-03-28 (×3): 6 via TOPICAL

## 2018-03-26 MED ORDER — FLUOXETINE HCL 20 MG PO CAPS
40.0000 mg | ORAL_CAPSULE | Freq: Every day | ORAL | Status: DC
  Administered 2018-03-26 – 2018-03-28 (×3): 40 mg via ORAL
  Filled 2018-03-26 (×3): qty 2
  Filled 2018-03-26: qty 4

## 2018-03-26 MED ORDER — MORPHINE SULFATE (PF) 2 MG/ML IV SOLN: 2.0000 mg | INTRAVENOUS | Status: DC | PRN

## 2018-03-26 NOTE — Progress Notes (Signed)
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 Requiring min A with RW. She continues to fatigue very quickly and needs assist to get in and out of bed. Given that she lives with her sister who has dementia, continue to recommend SNF for her to rehab before returning to caregiving. PT will continue to follow.    Follow Up Recommendations  SNF     Equipment Recommendations  Rolling walker with 5" wheels    Recommendations for Other Services       Precautions / Restrictions Precautions Precautions: Fall Restrictions Weight Bearing Restrictions: No    Mobility  Bed Mobility Overal bed mobility: Needs Assistance Bed Mobility: Supine to Sit;Sit to Supine     Supine to sit: Min assist Sit to supine: Min assist   General bed mobility comments: vc's for getting to EOB and therapist's hand on pt's lower leg to stabilize. Needed min A to get LE's back into bed on return. HOB 30deg and pt had difficuly rising from this level  Transfers Overall transfer level: Needs assistance Equipment used: Rolling walker (2 wheeled) Transfers: Sit to/from Stand Sit to Stand: Min assist;Min guard         General transfer comment: min A from bed, min-guard A from chair with B arm rests, vc's for fwd wt shift  Ambulation/Gait Ambulation/Gait assistance: Min assist Gait Distance (Feet): 160 Feet(80', seated rest, 80') Assistive device: Rolling walker (2 wheeled) Gait Pattern/deviations: Step-through pattern;Decreased step length - right;Decreased step length - left;Shuffle;Antalgic;Trunk flexed Gait velocity:  decreased Gait velocity interpretation: <1.31 ft/sec, indicative of household ambulator General Gait Details: vc's for erect posture and breathing. Pt becomes winded after ~40', HR up to 106 bpm, O2 sats 93%   Stairs             Wheelchair Mobility    Modified Rankin (Stroke Patients Only)       Balance Overall balance assessment: Needs assistance Sitting-balance support: Feet supported;No upper extremity supported Sitting balance-Leahy Scale: Fair     Standing balance support: During functional activity;Bilateral upper extremity supported Standing balance-Leahy Scale: Fair Standing balance comment: able to maintain static standing without support                            Cognition Arousal/Alertness: Awake/alert Behavior During Therapy: WFL for tasks assessed/performed Overall Cognitive Status: Within Functional Limits for tasks assessed                                        Exercises Total Joint Exercises Bridges: 5 reps General Exercises - Lower Extremity Ankle Circles/Pumps: AROM;Both;10 reps;Supine Heel Slides: AROM;Both;5 reps;Supine Straight Leg Raises: AROM;Both;5 reps;Supine    General Comments General comments (skin integrity, edema, etc.): pt's sister with dementia lives with her. Their other sister is here to stay with her for 1.5 weeks but will then have to return home.       Pertinent Vitals/Pain Pain Assessment: Faces Faces Pain Scale: Hurts even more Pain Location:  R UQ pain Pain Descriptors / Indicators: Grimacing;Guarding;Operative site guarding Pain Intervention(s): Limited activity within patient's tolerance;Monitored during session    Home Living                      Prior Function            PT Goals (current goals can now be found in the care plan section) Acute Rehab PT Goals Patient Stated Goal: have less pain PT Goal Formulation: With patient/family Time For Goal Achievement:  04/07/18 Potential to Achieve Goals: Fair Progress towards PT goals: Progressing toward goals    Frequency    Min 3X/week      PT Plan Current plan remains appropriate    Co-evaluation              AM-PAC PT "6 Clicks" Mobility   Outcome Measure  Help needed turning from your back to your side while in a flat bed without using bedrails?: A Little Help needed moving from lying on your back to sitting on the side of a flat bed without using bedrails?: A Little Help needed moving to and from a bed to a chair (including a wheelchair)?: A Little Help needed standing up from a chair using your arms (e.g., wheelchair or bedside chair)?: A Little Help needed to walk in hospital room?: A Little Help needed climbing 3-5 steps with a railing? : Total 6 Click Score: 16    End of Session Equipment Utilized During Treatment: Gait belt Activity Tolerance: Patient tolerated treatment well Patient left: with call bell/phone within reach;with family/visitor present;in bed;with bed alarm set Nurse Communication: Mobility status PT Visit Diagnosis: Unsteadiness on feet (R26.81);Other abnormalities of gait and mobility (R26.89);Muscle weakness (generalized) (M62.81);Difficulty in walking, not elsewhere classified (R26.2);Pain Pain - part of body: (R UQ)     Time: 8675-4492 PT Time Calculation (min) (ACUTE ONLY): 30 min  Charges:  $Gait Training: 23-37 mins                     [place]  Pager [phone] Office [place] 03/26/2018, 4:33 PM

## 2018-03-26 NOTE — Progress Notes (Signed)
[facility] Progress Note  4 Days Post-Op  Subjective: CC-  Doing ok today. Abdominal pain well controlled. Complaining of more pain in hips/knees from her arthritis. Tolerating clear liquids. Denies n/v. Passing flatus. No BM since surgery.   Objective: Vital signs in last 24 hours: Temp:  [98 F (36.7 C)-98.4 F (36.9 C)] 98.4 F (36.9 C) (01/21 0430) Pulse Rate:  [80-137] 85 (01/21 0430) Resp:  [13-20] 19 (01/20 2020) BP: (129-150)/(53-62) 140/61 (01/21 0430) SpO2:  [77 %-100 %] 96 % (01/21 0430) Weight:  [122.5 kg-125.1 kg] 125.1 kg (01/21 0553) Last BM Date: 03/21/18  Intake/Output from previous day: 01/20 0701 - 01/21 0700 In: 1571.7 [P.O.:60; I.V.:1297.4; IV Piggyback:214.3] Out: 1445 [Urine:1400; Drains:45] Intake/Output this shift: Total I/O In: 142.1 [I.V.:100; IV Piggyback:42.1] Out: -   PE: Gen:  Alert, NAD, pleasant HEENT: EOM's intact, pupils equal and round Pulm:  CTAB, no W/R/R, effort normal Abd: Soft, mild distension, +BS, midline incision cdi with staples intact and no erythema or drainage, JP drain with serosanguinous output Skin: no rashes noted, warm and dry  Lab Results:  Recent Labs    03/25/18 0307 03/26/18 0351  WBC 10.5 7.2  HGB 10.2* 9.3*  HCT 32.8* 30.4*  PLT 185 186   BMET Recent Labs    03/25/18 0307 03/26/18 0351  NA 139 138  K 4.1 3.8  CL 105 105  CO2 24 27  GLUCOSE 149* 155*  BUN 16 10  CREATININE 1.02* 0.90  CALCIUM 8.3* 8.4*   PT/INR No results for input(s): LABPROT, INR in the last 72 hours. CMP     Component Value Date/Time   NA 138 03/26/2018 0351   NA 146 (H) 11/01/2017 1213   K 3.8 03/26/2018 0351   CL 105 03/26/2018 0351   CO2 27 03/26/2018 0351   GLUCOSE 155 (H) 03/26/2018 0351   BUN 10 03/26/2018 0351   BUN 20 11/01/2017 1213   CREATININE 0.90 03/26/2018 0351   CREATININE 1.23 (H) 06/28/2016 1209   CALCIUM 8.4 (L) 03/26/2018 0351   CALCIUM 8.4 12/29/2007 0528   PROT 5.8 (L)  03/26/2018 0351   PROT 7.1 11/01/2017 1213   ALBUMIN 2.2 (L) 03/26/2018 0351   ALBUMIN 4.5 11/01/2017 1213   AST 15 03/26/2018 0351   ALT 17 03/26/2018 0351   ALKPHOS 95 03/26/2018 0351   BILITOT 1.1 03/26/2018 0351   BILITOT 0.6 11/01/2017 1213   GFRNONAA >60 03/26/2018 0351   GFRAA >60 03/26/2018 0351   Lipase  No results found for: LIPASE     Studies/Results: Dg [place] Floor Scout & Delayed Images W Single Cm  Result Date: 03/25/2018 CLINICAL DATA:  83 year old female inpatient status post surgical repair of perforated duodenal bulb ulcer on 03/22/2018, presenting for postoperative evaluation. Patient reports slowly improving abdominal pain. EXAM: WATER SOLUBLE UPPER GI SERIES TECHNIQUE: Single-column upper GI series was performed using water soluble contrast. CONTRAST:  Omnipaque 300 p.o. COMPARISON:  03/22/2018 CT abdomen/pelvis. FLUOROSCOPY TIME:  Fluoroscopy Time:  2 minutes 54 seconds Radiation Exposure Index (if provided by the fluoroscopic device): 6.0 Number of Acquired Spot Images: 8 FINDINGS: Scout radiograph demonstrates multiple bilateral renal stones and right upper quadrant gallstone. Right-sided surgical drain terminates in the medial upper left abdomen. Vertical skin staples in the midline abdomen. No disproportionately dilated small bowel loops. No evidence of pneumatosis or pneumoperitoneum. Normal esophageal distensibility and normal esophageal motility, with no gross evidence of esophageal mass, stricture or ulcer. Stomach is nondistended. No hiatal hernia. Normal  gastric emptying. No definite gastric fold thickening or gastric filling defects. Normal caliber duodenum with no duodenal fold thickening or filling defects. No evidence of contrast leak within the proximal duodenum at the ulcer repair site. No ulcer detected on this scan. Normal position of the duodenal jejunal junction. Contrast passes into normal caliber proximal jejunal loops. IMPRESSION: Normal postoperative  water-soluble upper GI, with no evidence of contrast leak at the repair site in the proximal duodenum. Normal gastric emptying. No evidence of small-bowel obstruction or ileus. Cholelithiasis. Bilateral nephrolithiasis. Electronically Signed   By: Ilona Sorrel M.D.   On: 03/25/2018 11:53    Anti-infectives: Anti-infectives (From admission, onward)   Start     Dose/Rate Route Frequency Ordered Stop   03/23/18 0400  piperacillin-tazobactam (ZOSYN) IVPB 3.375 g     3.375 g 12.5 mL/hr over 240 Minutes Intravenous Every 8 hours 03/23/18 0212     03/22/18 2315  cefoTEtan in Dextrose 5% (CEFOTAN) IVPB 2 g     2 g 100 mL/hr over 30 Minutes Intravenous To Surgery 03/22/18 2314 03/23/18 2315   03/22/18 1900  piperacillin-tazobactam (ZOSYN) IVPB 3.375 g     3.375 g 100 mL/hr over 30 Minutes Intravenous  Once 03/22/18 1854 03/22/18 2031       Assessment/Plan DM- SSI HTN- home meds HLD- lipitor Anxiety/depression- prozac  Perforated duodenal ulcer  S/p exploratory laparotomy, lysis of adhesions, modified graham's patch 1/17 Dr. Kieth Brightly - POD 4 - UGI 1/20 negative for leak, started on clears - JP drain output serosanguinous, 45cc/24hr - continue PPI and IV zosyn, check H pylori  ID - zosyn 1/17>> FEN - IVF, FLD, Ensure VTE - SCDs, lovenox Foley - none  Plan - Advance to full liquids and add Ensure. Check h pylori. Continue therapies, patient hopeful to go home with home health rather than to SNF. Add home meds for HTN, HLD, and anxiety/depression.    LOS: 3 days    Wellington Hampshire , [facility] 03/26/2018, 9:13 AM Pager: [phone] Mon-Thurs 7:00 am-4:30 pm Fri 7:00 am -11:30 AM Sat-Sun 7:00 am-11:30 am

## 2018-03-26 NOTE — Discharge Instructions (Signed)
[facility]      [facility] [phone]  OPEN ABDOMINAL SURGERY: POST OP INSTRUCTIONS  Always review your discharge instruction sheet given to you by the facility where your surgery was performed.  IF YOU HAVE DISABILITY OR FAMILY LEAVE FORMS, YOU MUST BRING THEM TO THE OFFICE FOR PROCESSING.  PLEASE DO NOT GIVE THEM TO YOUR DOCTOR.  1. A prescription for pain medication may be given to you upon discharge.  Take your pain medication as prescribed, if needed.  If narcotic pain medicine is not needed, then you may take acetaminophen (Tylenol) or ibuprofen (Advil) as needed. 2. Take your usually prescribed medications unless otherwise directed. 3. If you need a refill on your pain medication, please contact your pharmacy. They will contact our office to request authorization.  Prescriptions will not be filled after 5pm or on week-ends. 4. You should follow a light diet the first few days after arrival home, such as soup and crackers, pudding, etc.unless your doctor has advised otherwise. A high-fiber, low fat diet can be resumed as tolerated.   Be sure to include lots of fluids daily. Most patients will experience some swelling and bruising on the chest and neck area.  Ice packs will help.  Swelling and bruising can take several days to resolve 5. Most patients will experience some swelling and bruising in the area of the incision. Ice pack will help. Swelling and bruising can take several days to resolve..  6. It is common to experience some constipation if taking pain medication after surgery.  Increasing fluid intake and taking a stool softener will usually help or prevent this problem from occurring.  A mild laxative (Milk of Magnesia or Miralax) should be taken according to package directions if there are no bowel movements after 48 hours. 7.  You may have steri-strips (small skin tapes) in place directly over the incision.  These strips should be left on the skin for 7-10 days.  If your  surgeon used skin glue on the incision, you may shower in 24 hours.  The glue will flake off over the next 2-3 weeks.  Any sutures or staples will be removed at the office during your follow-up visit. You may find that a light gauze bandage over your incision may keep your staples from being rubbed or pulled. You may shower and replace the bandage daily. 8. ACTIVITIES:  You may resume regular (light) daily activities beginning the next day--such as daily self-care, walking, climbing stairs--gradually increasing activities as tolerated.  You may have sexual intercourse when it is comfortable.  Refrain from any heavy lifting or straining until approved by your doctor. a. You may drive when you no longer are taking prescription pain medication, you can comfortably wear a seatbelt, and you can safely maneuver your car and apply brakes b. Return to Work: ___________________________________ 9. You should see your doctor in the office for a follow-up appointment approximately two weeks after your surgery.  Make sure that you call for this appointment within a day or two after you arrive home to insure a convenient appointment time. OTHER INSTRUCTIONS:  _____________________________________________________________ _____________________________________________________________  WHEN TO CALL YOUR DOCTOR: 1. Fever over 101.0 2. Inability to urinate 3. Nausea and/or vomiting 4. Extreme swelling or bruising 5. Continued bleeding from incision. 6. Increased pain, redness, or drainage from the incision. 7. Difficulty swallowing or breathing 8. Muscle cramping or spasms. 9. Numbness or tingling in hands or feet or around lips.  The clinic staff is available to   answer your questions during regular business hours.  Please don't hesitate to call and ask to speak to one of the nurses if you have concerns.  For further questions, please visit [email or web address]   

## 2018-03-26 NOTE — Care Management Note (Signed)
Case Management Note  Patient Details  Name: [name] MRN: [id number] Date of Birth: 02-25-1936  Subjective/Objective:                    Action/Plan:  Will continue to follow for discharge needs. PT recommendation SNF patient hoping to improve and discharge with home health . Went to discuss with patient, she currently has visitors. NCM will follow up later today or in morning. Patient appreciative. Expected Discharge Date:                  Expected Discharge Plan:     In-House Referral:  Clinical Social Work  Discharge planning Services  CM Consult  Post Acute Care Choice:    Choice offered to:     DME Arranged:    DME Agency:     HH Arranged:    HH Agency:     Status of Service:  In process, will continue to follow  If discussed at Long Length of Stay Meetings, dates discussed:    Additional Comments:  Marilu Favre, RN 03/26/2018, 3:01 PM

## 2018-03-27 LAB — CBC
HCT: 29.9 % — ABNORMAL LOW (ref 36.0–46.0)
Hemoglobin: 9.5 g/dL — ABNORMAL LOW (ref 12.0–15.0)
MCH: 29.1 pg (ref 26.0–34.0)
MCHC: 31.8 g/dL (ref 30.0–36.0)
MCV: 91.7 fL (ref 80.0–100.0)
Platelets: 217 10*3/uL (ref 150–400)
RBC: 3.26 MIL/uL — ABNORMAL LOW (ref 3.87–5.11)
RDW: 14.6 % (ref 11.5–15.5)
WBC: 8.6 10*3/uL (ref 4.0–10.5)
nRBC: 0 % (ref 0.0–0.2)

## 2018-03-27 LAB — CULTURE, BLOOD (ROUTINE X 2)
Culture: NO GROWTH
Culture: NO GROWTH
Special Requests: ADEQUATE
Special Requests: ADEQUATE

## 2018-03-27 LAB — COMPREHENSIVE METABOLIC PANEL
ALT: 26 U/L (ref 0–44)
AST: 22 U/L (ref 15–41)
Albumin: 2.2 g/dL — ABNORMAL LOW (ref 3.5–5.0)
Alkaline Phosphatase: 164 U/L — ABNORMAL HIGH (ref 38–126)
Anion gap: 7 (ref 5–15)
BUN: 6 mg/dL — ABNORMAL LOW (ref 8–23)
CO2: 26 mmol/L (ref 22–32)
CREATININE: 0.89 mg/dL (ref 0.44–1.00)
Calcium: 8.4 mg/dL — ABNORMAL LOW (ref 8.9–10.3)
Chloride: 105 mmol/L (ref 98–111)
GFR calc Af Amer: >60 mL/min (ref >60)
GFR calc non Af Amer: >60 mL/min (ref >60)
Glucose, Bld: 163 mg/dL — ABNORMAL HIGH (ref 70–99)
Potassium: 3.6 mmol/L (ref 3.5–5.1)
Sodium: 138 mmol/L (ref 135–145)
TOTAL PROTEIN: 5.8 g/dL — AB (ref 6.5–8.1)
Total Bilirubin: 1 mg/dL (ref 0.3–1.2)

## 2018-03-27 LAB — GLUCOSE, CAPILLARY
GLUCOSE-CAPILLARY: 175 mg/dL — AB (ref 70–99)
Glucose-Capillary: 166 mg/dL — ABNORMAL HIGH (ref 70–99)
Glucose-Capillary: 121 mg/dL — ABNORMAL HIGH (ref 70–99)
Glucose-Capillary: 141 mg/dL — ABNORMAL HIGH (ref 70–99)

## 2018-03-27 LAB — H. PYLORI ANTIBODY, IGG: H Pylori IgG: 4.67 Index Value — ABNORMAL HIGH (ref 0.00–0.79)

## 2018-03-27 MED ORDER — PANTOPRAZOLE SODIUM 40 MG PO TBEC
40.0000 mg | DELAYED_RELEASE_TABLET | Freq: Two times a day (BID) | ORAL | Status: DC
  Administered 2018-03-27 – 2018-03-28 (×2): 40 mg via ORAL
  Filled 2018-03-27 (×2): qty 1

## 2018-03-27 MED ORDER — MORPHINE SULFATE (PF) 2 MG/ML IV SOLN: 2.0000 mg | INTRAVENOUS | Status: DC | PRN

## 2018-03-27 NOTE — Clinical Social Work Note (Signed)
Clinical Social Work Assessment  Patient Details  Name: [name] MRN: [id number] Date of Birth: 06/11/1935  Date of referral:  03/27/18               Reason for consult:  Facility Placement, Discharge Planning                Permission sought to share information with:  Facility Sport and exercise psychologist, Family Supports Permission granted to share information::  Yes, Verbal Permission Granted  Name::      Howie Ill and Biochemist, clinical  Agency::   [facility]  Relationship::   sisters  Contact Information:   [phone]  Housing/Transportation Living arrangements for the past 2 months:  [place] of Information:  Patient, Siblings Patient Interpreter Needed:  None Criminal Activity/Legal Involvement Pertinent to Current Situation/Hospitalization:  No - Comment as needed Significant Relationships:  Siblings, Other Family Members Lives with:  Siblings Do you feel safe going back to the place where you live?  Yes Need for family participation in patient care:  Yes (Comment)  Care giving concerns:  Pt lives at home with her sister, the other sister is here visiting from [place], therapy staff recommended SNF and supervision at discharge. Pt hesistant about SNF, would prefer to return home with home health but does recognize that she would benefit from consistent therapies.   Social Worker assessment / plan:  CSW met with pt and her two sisters at bedside. Introduced self, role, and reason for visit. Pt lives with her sister at home and her other sister is visiting until 1/29 from [place]. Pt is relatively independent and desires to return home, however she does understand that she needs consistent therapy and supervision. Pt provided with CMS ratings packet and SNF offers, she and her sisters are aware of how they are set up and that pt may have to make a deicsion today.  CSW also discussed that because pt really desires to go home and has good support that we  could screen her for [facility] with [facility]. Pt very interested. Understands that if she is not eligible then she will choose a SNF.   Will make Home First referral.   Employment status:  Retired Forensic scientist:  Medicare PT Recommendations:  [place], [place] / Referral to community resources:  [place]  Patient/Family's Response to care:  Pt amenable to speaking with CSW, she is interested in SNF if she is unable to be screened and accepted for Home First program.  Patient/Family's Understanding of and Emotional Response to Diagnosis, Current Treatment, and Prognosis:  Pt and pt family state understanding of diagnosis, current treatment and prognosis. They are realistic about pts needs and level of support required at home. Pt interested in how she can safely discharge home. Pt and her sisters are both emotionally appropriate and pleasant- engaged in conversation.  Emotional Assessment Appearance:  Appears stated age Attitude/Demeanor/Rapport:  Engaged, Gracious, Charismatic Affect (typically observed):  Adaptable, Accepting, Pleasant Orientation:  Oriented to Situation, Oriented to  Time, Oriented to Place, Oriented to Self Alcohol / Substance use:  Not Applicable Psych involvement (Current and /or in the community):  No (Comment)  Discharge Needs  Concerns to be addressed:  Care Coordination Readmission within the last 30 days:  No Current discharge risk:  Physical Impairment Barriers to Discharge:  Continued Medical Work up   [employer], [place] 03/27/2018, 12:20 PM

## 2018-03-27 NOTE — Care Management Note (Addendum)
Case Management Note  Patient Details  Name: Vernette Moise MRN: [id number] Date of Birth: Apr 27, 1935  Subjective/Objective:                    Action Patient has been accepted to [place] program. She will need orders and face to face for HHRN,PT,OT. Paged [facility]CS. Received orders for HHRN,PT,OT,SW, aide  Patient from home with sister, another sister in town from [place]. Patient has 24 hour assistance at home and is very interested in [place] program.   Referral given to [facility] with [place]. Tommi Rumps will speak with patient and her sisters and review chart. If patient meets criteria for program will need home health orders and face to face.   If patient does discharge to home , she already has walker and 3 in 1 .  Expected Discharge Date:                  Expected Discharge Plan:     In-House Referral:  Clinical Social Work  Discharge planning Services  CM Consult  Post Acute Care Choice:    Choice offered to:  Patient, Sibling  DME Arranged:    DME Agency:     HH Arranged:    [place] Agency:     Status of Service:  In process, will continue to follow  If discussed at Long Length of Stay Meetings, dates discussed:    Additional Comments:  Marilu Favre, RN 03/27/2018, 12:19 PM

## 2018-03-27 NOTE — Progress Notes (Signed)
[facility] Progress Note  5 Days Post-Op  Subjective: CC-  Up in chair eating breakfast. Doing well today. Abdominal pain well controlled. Tolerating solid food. Denies n/v. Passing flatus this morning, BM x1 yesterday.  Therapies still recommending SNF, patient agreeable.  Objective: Vital signs in last 24 hours: Temp:  [98 F (36.7 C)-99.2 F (37.3 C)] 98 F (36.7 C) (01/22 0502) Pulse Rate:  [82-96] 82 (01/22 0502) Resp:  [16-20] 20 (01/22 0502) BP: (114-141)/(49-64) 137/64 (01/22 0502) SpO2:  [90 %-94 %] 94 % (01/22 0502) Last BM Date: 03/26/18  Intake/Output from previous day: 01/21 0701 - 01/22 0700 In: 1252.7 [I.V.:1059.5; IV Piggyback:193.1] Out: 1335 [Urine:1275; Drains:60] Intake/Output this shift: No intake/output data recorded.  PE: Gen:  Alert, NAD, pleasant HEENT: EOM's intact, pupils equal and round Pulm:  CTAB, no W/R/R, effort normal Abd: Soft, mild distension, +BS, midline incision cdi with staples intact and no erythema or drainage, JP drain with serous output Skin: no rashes noted, warm and dry  Lab Results:  Recent Labs    03/26/18 0351 03/27/18 0340  WBC 7.2 8.6  HGB 9.3* 9.5*  HCT 30.4* 29.9*  PLT 186 217   BMET Recent Labs    03/26/18 0351 03/27/18 0340  NA 138 138  K 3.8 3.6  CL 105 105  CO2 27 26  GLUCOSE 155* 163*  BUN 10 6*  CREATININE 0.90 0.89  CALCIUM 8.4* 8.4*   PT/INR No results for input(s): LABPROT, INR in the last 72 hours. CMP     Component Value Date/Time   NA 138 03/27/2018 0340   NA 146 (H) 11/01/2017 1213   K 3.6 03/27/2018 0340   CL 105 03/27/2018 0340   CO2 26 03/27/2018 0340   GLUCOSE 163 (H) 03/27/2018 0340   BUN 6 (L) 03/27/2018 0340   BUN 20 11/01/2017 1213   CREATININE 0.89 03/27/2018 0340   CREATININE 1.23 (H) 06/28/2016 1209   CALCIUM 8.4 (L) 03/27/2018 0340   CALCIUM 8.4 12/29/2007 0528   PROT 5.8 (L) 03/27/2018 0340   PROT 7.1 11/01/2017 1213   ALBUMIN 2.2 (L) 03/27/2018  0340   ALBUMIN 4.5 11/01/2017 1213   AST 22 03/27/2018 0340   ALT 26 03/27/2018 0340   ALKPHOS 164 (H) 03/27/2018 0340   BILITOT 1.0 03/27/2018 0340   BILITOT 0.6 11/01/2017 1213   GFRNONAA >60 03/27/2018 0340   GFRAA >60 03/27/2018 0340   Lipase  No results found for: LIPASE     Studies/Results: Dg [place] Floor Scout & Delayed Images W Single Cm  Result Date: 03/25/2018 CLINICAL DATA:  83 year old female inpatient status post surgical repair of perforated duodenal bulb ulcer on 03/22/2018, presenting for postoperative evaluation. Patient reports slowly improving abdominal pain. EXAM: WATER SOLUBLE UPPER GI SERIES TECHNIQUE: Single-column upper GI series was performed using water soluble contrast. CONTRAST:  Omnipaque 300 p.o. COMPARISON:  03/22/2018 CT abdomen/pelvis. FLUOROSCOPY TIME:  Fluoroscopy Time:  2 minutes 54 seconds Radiation Exposure Index (if provided by the fluoroscopic device): 6.0 Number of Acquired Spot Images: 8 FINDINGS: Scout radiograph demonstrates multiple bilateral renal stones and right upper quadrant gallstone. Right-sided surgical drain terminates in the medial upper left abdomen. Vertical skin staples in the midline abdomen. No disproportionately dilated small bowel loops. No evidence of pneumatosis or pneumoperitoneum. Normal esophageal distensibility and normal esophageal motility, with no gross evidence of esophageal mass, stricture or ulcer. Stomach is nondistended. No hiatal hernia. Normal gastric emptying. No definite gastric fold thickening or gastric filling defects.  Normal caliber duodenum with no duodenal fold thickening or filling defects. No evidence of contrast leak within the proximal duodenum at the ulcer repair site. No ulcer detected on this scan. Normal position of the duodenal jejunal junction. Contrast passes into normal caliber proximal jejunal loops. IMPRESSION: Normal postoperative water-soluble upper GI, with no evidence of contrast leak at the repair  site in the proximal duodenum. Normal gastric emptying. No evidence of small-bowel obstruction or ileus. Cholelithiasis. Bilateral nephrolithiasis. Electronically Signed   By: Ilona Sorrel M.D.   On: 03/25/2018 11:53    Anti-infectives: Anti-infectives (From admission, onward)   Start     Dose/Rate Route Frequency Ordered Stop   03/23/18 0400  piperacillin-tazobactam (ZOSYN) IVPB 3.375 g     3.375 g 12.5 mL/hr over 240 Minutes Intravenous Every 8 hours 03/23/18 0212 03/27/18 2359   03/22/18 2315  cefoTEtan in Dextrose 5% (CEFOTAN) IVPB 2 g     2 g 100 mL/hr over 30 Minutes Intravenous To Surgery 03/22/18 2314 03/23/18 2315   03/22/18 1900  piperacillin-tazobactam (ZOSYN) IVPB 3.375 g     3.375 g 100 mL/hr over 30 Minutes Intravenous  Once 03/22/18 1854 03/22/18 2031       Assessment/Plan DM- SSI HTN- home meds HLD- lipitor Anxiety/depression- prozac  Perforated duodenal ulcer S/p exploratory laparotomy, lysis of adhesions, modified graham's patch 1/17 Dr. Kieth Brightly - POD 5 - UGI 1/20 negative for leak - JP drain output serous, 60cc/24hr, can d/c prior to discharge - continue PPI and IV zosyn, H pylori pending  ID - zosyn 1/17>>1/22 FEN - KVO IVF, soft diet, Ensure VTE - SCDs, lovenox Foley - none  Plan - Continue soft diet. D/c antibiotics after today. Continue BID PPI, follow h pylori. Patient medically stable for discharge. Social work is following, will ask them to assist patient with picking a facility.   LOS: 4 days    Wellington Hampshire , [facility] 03/27/2018, 8:57 AM Pager: [phone] Mon-Thurs 7:00 am-4:30 pm Fri 7:00 am -11:30 AM Sat-Sun 7:00 am-11:30 am

## 2018-03-27 NOTE — Social Work (Signed)
Aware pt approved for [place] signing off. Please consult if any additional needs arise.  Alexander Mt, [place] Work [phone]

## 2018-03-27 NOTE — Consult Note (Signed)
[facility] CM Primary Care Navigator  03/27/2018  [name] [birth year] [id number]   Met with patient and sisters Jeanne Ivan and Barnetta Chapel) at the bedside to identify possible discharge needs.   Patient presented with "severe epigastric pain", found with perforated duodenal ulcer, underwent exploratory laparotomy- repair of perforated duodenal ulcer.    Patient endorses Dr. Leighton Ruff with [place] [facility] as her primary care provider and who helps her manage her health conditions mainly DM, HTN.Patient denies any needs with transportation, medications and caregiver for now.  Discharge disposition is skilled nursing facility per therapy recommendation.  Patient is from home with sister and another sister who came from [place].  Has 24-hour assistance at home and is very interested in [place] program per Inpatient CM- referral to [facility] from [place] was made.  [facility] hospital liaison notified of patient's discharge disposition.  There are no identifiable [facility] Care Management needs at this point.    For additional questions please contact:  Edwena Felty A. Victoire Deans, BSN, RN-BC [facility] PRIMARY CARE Navigator Cell: [phone]

## 2018-03-27 NOTE — Discharge Summary (Signed)
[facility] Discharge Summary   Patient ID: [name] MRN: [id number] DOB/AGE: [birth year] 83 y.o.  Admit date: 03/22/2018 Discharge date: 03/28/2018  Admitting Diagnosis: Free air Leukocytosis Acidosis   Discharge Diagnosis Patient Active Problem List   Diagnosis Date Noted  . Perforated duodenal ulcer ([place]) 03/23/2018  . Severe aortic stenosis 07/17/2017  . 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 Mitral stenosis 10/02/2016  . Symptomatic bradycardia 04/21/2016  . Bradycardia 04/20/2016  . Morbid obesity ([place]) 04/20/2016  . Essential hypertension 04/20/2016  . Insulin dependent diabetes mellitus ([place]) 04/20/2016  . History of ovarian cancer 04/20/2016  . Aortic stenosis 04/20/2016  . Spinal stenosis 04/20/2016  . Complete heart block ([place]) 04/20/2016  . History of breast cancer 12/27/2010    Consultants None  Imaging: No results found.  Procedures Dr. Kieth Brightly (03/22/18) - exploratory laparotomy, lysis of adhesions, modified graham's patch  Hospital Course:  [name] is an 83yo female who presented to [facility] 1/17 with 3 days of worsening epigastric pain.  Workup included CT scan which showed moderate free air throughout the abdomen compatible with perforated viscous, site of perforation not definitely identified .  Patient was admitted and underwent procedure listed above. Intraoperatively she was found to have perforated duodenal ulcer. Tolerated procedure well and was transferred to the floor.  UGI performed 1/20 and revealed no post-operative leak, therefore NG tube was removed and diet slowly advanced as tolerated.  Patient worked with therapies during this admission who recommended SNF vs 24hr supervision with home health therapies when medically stable for discharge. JP drain removed on 1/23. H pylori IgG came back positive so [name] was started on triple therapy for treatment with clarithromycin, amoxicillin, and PPI.  On POD6, the patient was voiding well, tolerating diet, ambulating well, pain well controlled, vital signs stable, incisions c/d/i and felt stable for discharge home.  Patient will follow up as below and knows to call with questions or concerns.    I have personally reviewed the patients medication history on the [place] controlled substance database.    Physical Exam: Gen: Alert, NAD, pleasant HEENT: EOM's intact, pupils equal and round Pulm: CTAB, no W/R/R, effort normal Abd: Soft,mild distension, +BS,midline incision cdi with staples intact and no erythema or drainage, JP drain with serous output >> drain moved Skin: no rashes noted, warm and dry  Allergies as of 03/28/2018      Reactions   Nsaids Anaphylaxis   Tape Rash   CLEAR PLASTIC TAPE      Medication List    STOP taking these medications   aspirin 81 MG tablet     TAKE these medications   acetaminophen 650 MG CR tablet Commonly known as:  TYLENOL Take 1,300 mg by mouth every 8 (eight) hours as needed for pain.   amoxicillin 500 MG tablet Commonly known as:  AMOXIL Take 1 tablet (500 mg total) by mouth 2 (two) times daily for 14 days. What changed:    how much to take  how to take this  when to take this  additional instructions   amoxicillin 500 MG tablet Commonly known as:  AMOXIL Take 2 tablets (1,000 mg total) by mouth 2 (two) times daily for 14 days. What changed:  You were already taking a medication with the same name, and this prescription was added. Make sure you understand how and when to take each.   atorvastatin 20 MG tablet Commonly known as:  LIPITOR Take 20 mg by mouth at  bedtime.   CALCIUM CITRATE + PO Take 1 tablet by mouth 2 (two) times daily.   cholecalciferol 1000 units tablet Commonly known as:  VITAMIN D Take 1,000 Units by mouth 2 (two) times daily.   clarithromycin 500 MG tablet Commonly known as:  BIAXIN Take 1 tablet (500 mg total) by mouth 2 (two) times daily for 14  days.   fish oil-omega-3 fatty acids 1000 MG capsule Take 1 g by mouth every evening.   FLUoxetine 40 MG capsule Commonly known as:  PROZAC Take 40 mg by mouth daily.   furosemide 40 MG tablet Commonly known as:  LASIX TAKE 1 TABLET BY MOUTH TWICE DAILY What changed:    how much to take  when to take this   LEVEMIR FLEXTOUCH 100 UNIT/ML Pen Generic drug:  Insulin Detemir Inject 60-70 Units into the skin 2 (two) times daily. Per sliding scale   losartan 50 MG tablet Commonly known as:  COZAAR Take 50 mg by mouth daily.   Magnesium 400 MG Caps Take 400 mg by mouth at bedtime.   metFORMIN 1000 MG tablet Commonly known as:  GLUCOPHAGE Take 1,000 mg by mouth 2 (two) times daily with a meal.   metoprolol tartrate 25 MG tablet Commonly known as:  LOPRESSOR Take 12.5 mg by mouth 2 (two) times daily.   multivitamin tablet Take 1 tablet by mouth daily.   oxyCODONE 5 MG/5ML solution Commonly known as:  ROXICODONE Take 5 mLs (5 mg total) by mouth every 6 (six) hours as needed for severe pain.   pantoprazole 40 MG tablet Commonly known as:  PROTONIX Take 1 tablet (40 mg total) by mouth 2 (two) times daily.   saccharomyces boulardii 250 MG capsule Commonly known as:  FLORASTOR Take 1 capsule (250 mg total) by mouth 2 (two) times daily. You can find a probiotic over the counter. Take this while you are on antibiotics.        Follow-up Information    Kinsinger, Arta Bruce, MD. Go on 04/17/2018.   Specialty:  General Surgery Why:  Your appointment is 2/12 at 9:15am with Dr. Kieth Brightly to follow up from your recent surgery. Please arrive 15 minutes early to check in. Contact information: [address] [place] [address] [phone]        [facility], [place]. Go on 04/04/2018.   Specialty:  General Surgery Why:  Your appointment is 1/30 at 2:00PM with one of our nurses to have your staples removed. Please arrive 30 minutes prior to your  appointment to check in and fill out paperwork. Bring photo ID and insurance information. Contact information: [address] [place] [phone]       Leighton Ruff, MD. Call.   Specialty:  Family Medicine Why:  Call to arrange post-hospitalization follow up appointment Contact information: [place] [address] [place], [facility] Follow up.   Specialty:  Home Health Services Contact information: [place] [address] [phone]           Signed: Wellington Hampshire, [facility] 03/28/2018, 9:00 AM Pager: [phone] Mon-Thurs 7:00 am-4:30 pm Fri 7:00 am -11:30 AM Sat-Sun 7:00 am-11:30 am

## 2018-03-27 NOTE — Progress Notes (Signed)
Occupational Therapy Treatment Patient Details Name: [name] MRN: [id number] DOB: [birth year] Today's Date: 03/27/2018    History of present illness 17yoF with hx of HTN, DM, HLD, TAVR 07/2017, arthritis, presented with perforated DU - now s/p exlap/Graham patch, repair of perforated DU 03/22/2018   OT comments  Pt participated in ADL retraining session today with focus on functional mobility, toilet transfers and standing at sink for grooming. Pt is currently Min A for all of the above and d/c plan remains appropriate.   Follow Up Recommendations  SNF;Supervision/Assistance - 24 hour    Equipment Recommendations  Other (comment)(Defer to next venue)    Recommendations for Other Services      Precautions / Restrictions Precautions Precautions: Fall Precaution Comments: Contact Precautions Restrictions Weight Bearing Restrictions: No       Mobility Bed Mobility Overal bed mobility: (Pt received up in hallway ambulating with staff)                Transfers Overall transfer level: Needs assistance Equipment used: Rolling walker (2 wheeled) Transfers: Sit to/from [facility] Sit to Stand: Min guard;Min assist Stand pivot transfers: Min guard       General transfer comment: Min A from comfort height toilet using grab bar & RW and Min guard A to chair with B arm rests, vc's for fwd wt shift    Balance Overall balance assessment: Needs assistance Sitting-balance support: Feet supported;No upper extremity supported Sitting balance-Leahy Scale: Fair     Standing balance support: During functional activity;Bilateral upper extremity supported Standing balance-Leahy Scale: Fair                             ADL either performed or assessed with clinical judgement   ADL Overall ADL's : Needs assistance/impaired Eating/Feeding: Modified independent;Sitting   Grooming: Wash/dry hands;Wash/dry face;Oral care;Standing;Minimal  assistance Grooming Details (indicate cue type and reason): Pt completed grooming standing at sink today, however, she was noted to lean forward and rest bilateral UE/elbows on sink/counter top to stabilize herself.                  Toilet Transfer: Minimal assistance;RW;Ambulation;Comfort height toilet;Grab bars Toilet Transfer Details (indicate cue type and reason): VC's for safety and sequencing w/ RW/grab bars Toileting- Clothing Manipulation and Hygiene: Minimal assistance;Sitting/lateral lean;Sit to/from stand       Functional mobility during ADLs: Min guard;Rolling walker General ADL Comments: Pt reports improved pain level rating as 5/10 today during functional mobility in hallway followed by standing at sink for grooming and toileting/transfers. Pt relies heaviliy on UE's in standing and requires use of grab bars during toilet transfer. Rest break required during functional mobility today noted.     Vision Baseline Vision/History: Wears glasses Wears Glasses: At all times Patient Visual Report: No change from baseline     Perception     Praxis      Cognition Arousal/Alertness: Awake/alert Behavior During Therapy: WFL for tasks assessed/performed Overall Cognitive Status: Within Functional Limits for tasks assessed                                          Exercises     Shoulder Instructions       General Comments      Pertinent Vitals/ Pain       Pain Assessment: 0-10 Pain Score:  5  Pain Location: R UQ/surgical incision pain & headache Pain Descriptors / Indicators: Grimacing;Guarding;Operative site guarding;Aching Pain Intervention(s): Limited activity within patient's tolerance;Monitored during session;Repositioned;Patient requesting pain meds-RN notified(Pt requested tylenol for head ache and RN was notified)  Home Living                                          Prior Functioning/Environment               Frequency  Min 2X/week        Progress Toward Goals  OT Goals(current goals can now be found in the care plan section)  Progress towards OT goals: Progressing toward goals  Acute Rehab OT Goals Patient Stated Goal: Go to SNF Rehab prior to home (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 ADL Goals Pt Will Perform Grooming: with min guard assist;standing Pt Will Perform Lower Body Dressing: with min guard assist;sit to/from stand Pt Will Transfer to Toilet: with min guard assist;ambulating;bedside commode Pt Will Perform Toileting - Clothing Manipulation and hygiene: with min guard assist;sit to/from stand;sitting/lateral leans  Plan Discharge plan remains appropriate    Co-evaluation                 AM-PAC OT "6 Clicks" Daily Activity     Outcome Measure   Help from another person eating meals?: None Help from another person taking care of personal grooming?: A Little Help from another person toileting, which includes using toliet, bedpan, or urinal?: A Little Help from another person bathing (including washing, rinsing, drying)?: A Lot Help from another person to put on and taking off regular upper body clothing?: A Little Help from another person to put on and taking off regular lower body clothing?: A Lot 6 Click Score: 17    End of Session Equipment Utilized During Treatment: Rolling walker  OT Visit Diagnosis: Unsteadiness on feet (R26.81);Other abnormalities of gait and mobility (R26.89);Muscle weakness (generalized) (M62.81);Pain Pain - part of body: (UQ surgical incision pain and headache)   Activity Tolerance Patient tolerated treatment well;Patient limited by fatigue   Patient Left in chair;with call bell/phone within reach   Nurse Communication Patient requests pain meds        Time: 0912-0935 OT Time Calculation (min): 23 min  Charges: OT General Charges $OT Visit: 1 Visit OT Treatments $Self Care/Home Management : 23-37  mins    Almyra Deforest, OTR/L 03/27/2018, 9:50 AM

## 2018-03-28 LAB — GLUCOSE, CAPILLARY
GLUCOSE-CAPILLARY: 149 mg/dL — AB (ref 70–99)
Glucose-Capillary: 132 mg/dL — ABNORMAL HIGH (ref 70–99)

## 2018-03-28 MED ORDER — AMOXICILLIN 500 MG PO TABS: 500.0000 mg | ORAL_TABLET | Freq: Two times a day (BID) | ORAL | 0 refills | Status: AC

## 2018-03-28 MED ORDER — AMOXICILLIN 500 MG PO TABS: 1000.0000 mg | ORAL_TABLET | Freq: Two times a day (BID) | ORAL | 0 refills | Status: AC

## 2018-03-28 MED ORDER — SACCHAROMYCES BOULARDII 250 MG PO CAPS: 250.0000 mg | ORAL_CAPSULE | Freq: Two times a day (BID) | ORAL | Status: AC

## 2018-03-28 MED ORDER — PANTOPRAZOLE SODIUM 40 MG PO TBEC: 40.0000 mg | DELAYED_RELEASE_TABLET | Freq: Two times a day (BID) | ORAL | 0 refills | Status: DC

## 2018-03-28 MED ORDER — OXYCODONE HCL 5 MG/5ML PO SOLN: 5.0000 mg | Freq: Four times a day (QID) | ORAL | 0 refills | Status: DC | PRN

## 2018-03-28 MED ORDER — CLARITHROMYCIN 500 MG PO TABS: 500.0000 mg | ORAL_TABLET | Freq: Two times a day (BID) | ORAL | 0 refills | Status: AC

## 2018-03-28 NOTE — Progress Notes (Signed)
Saw pt for line care (midline). Plan is for discharge today, per [place], [place]. Davina Poke, RN made aware. Floor staff will remove IV prior to discharge.

## 2018-03-28 NOTE — Progress Notes (Signed)
Physical Therapy Treatment Patient Details Name: [name] MRN: [id number] DOB: [birth year] Today's Date: 03/28/2018    History of Present Illness 39yoF with hx of HTN, DM, HLD, TAVR 07/2017, arthritis, presented with perforated DU - now s/p exlap/Graham patch, repair of perforated DU 03/22/2018    PT Comments    Pt is making good progress towards her goals today, and is expecting to d/c home with [facility] care this afternoon. Pt is currently limited in safe mobility by pain, DoE and generalized weakness. Pt requires min guard-minA for transfers depending on the height of the surface, min guard for ambulation with RW and rest breaks for DoE. Pt requires minA for ascent/descent of 3 steps with L rail. Pt current plan for d/c home with assistance and HHPT is appropriate at this time.     Follow Up Recommendations  Supervision/Assistance - 24 hour;Home health PT(going home with [facility])     Equipment Recommendations  None recommended by PT    Recommendations for Other Services       Precautions / Restrictions Precautions Precautions: Fall Precaution Comments: Contact Precautions Restrictions Weight Bearing Restrictions: No    Mobility  Bed Mobility Overal bed mobility: (OOB in recliner on entry)                Transfers Overall transfer level: Needs assistance Equipment used: Rolling walker (2 wheeled) Transfers: Sit to/from [facility] Sit to Stand: Min guard;Min assist         General transfer comment: min guard from recliner, minA from low bench in hallway   Ambulation/Gait Ambulation/Gait assistance: Min guard Gait Distance (Feet): 150 Feet Assistive device: Rolling walker (2 wheeled) Gait Pattern/deviations: Step-through pattern;Decreased step length - right;Decreased step length - left;Antalgic;Trunk flexed;Shuffle Gait velocity: decreased Gait velocity interpretation: 1.31 - 2.62 ft/sec, indicative of limited community  ambulator General Gait Details: min guard for safey, vc for upright posture and proximity to RW, required 1x seated rest break for 3/4 DoE   Stairs Stairs: Yes Stairs assistance: Min assist Stair Management: One rail Left;Sideways;Step to pattern Number of Stairs: 3 General stair comments: minA for steadying with ascent/descent of 3 steps with L sided railing       Balance Overall balance assessment: Needs assistance Sitting-balance support: Feet supported;No upper extremity supported Sitting balance-Leahy Scale: Fair     Standing balance support: During functional activity;Bilateral upper extremity supported Standing balance-Leahy Scale: Fair                              Cognition Arousal/Alertness: Awake/alert Behavior During Therapy: WFL for tasks assessed/performed Overall Cognitive Status: Within Functional Limits for tasks assessed                                           General Comments General comments (skin integrity, edema, etc.): Pt sisters present at beginning of session. Discussed with pt the continued need for use of IS due to increased SoB with ambulation       Pertinent Vitals/Pain Pain Assessment: Faces Faces Pain Scale: Hurts a little bit Pain Location: R UQ/surgical incision pain & headache Pain Descriptors / Indicators: Grimacing;Guarding;Operative site guarding;Aching Pain Intervention(s): Limited activity within patient's tolerance;Monitored during session;Repositioned           PT Goals (current goals can now be found in the care plan  section) Acute Rehab PT Goals PT Goal Formulation: With patient/family Time For Goal Achievement: 04/07/18 Potential to Achieve Goals: Fair Progress towards PT goals: Progressing toward goals    Frequency    Min 3X/week      PT Plan Discharge plan needs to be updated       AM-PAC PT "6 Clicks" Mobility   Outcome Measure  Help needed turning from your back to your  side while in a flat bed without using bedrails?: A Little Help needed moving from lying on your back to sitting on the side of a flat bed without using bedrails?: A Little Help needed moving to and from a bed to a chair (including a wheelchair)?: A Little Help needed standing up from a chair using your arms (e.g., wheelchair or bedside chair)?: A Little Help needed to walk in hospital room?: A Little Help needed climbing 3-5 steps with a railing? : A Little 6 Click Score: 18    End of Session Equipment Utilized During Treatment: Gait belt Activity Tolerance: Patient tolerated treatment well Patient left: in chair;with call bell/phone within reach;with family/visitor present Nurse Communication: Mobility status PT Visit Diagnosis: Unsteadiness on feet (R26.81);Other abnormalities of gait and mobility (R26.89);Muscle weakness (generalized) (M62.81);Difficulty in walking, not elsewhere classified (R26.2);Pain Pain - part of body: (R UQ)     Time: 8882-8003 PT Time Calculation (min) (ACUTE ONLY): 19 min  Charges:  $Gait Training: 8-22 mins                     Milliani Herrada B. Migdalia Dk PT, DPT Acute Rehabilitation Services Pager [phone] Office [phone]    [place] 03/28/2018, 3:25 PM

## 2018-03-28 NOTE — Plan of Care (Signed)
  Problem: Health Behavior/Discharge Planning: Goal: Ability to manage health-related needs will improve Outcome: Progressing   Problem: Clinical Measurements: Goal: Ability to maintain clinical measurements within normal limits will improve Outcome: Progressing Goal: Will remain free from infection Outcome: Progressing   Problem: Activity: Goal: Risk for activity intolerance will decrease Outcome: Progressing   Problem: Nutrition: Goal: Adequate nutrition will be maintained Outcome: Progressing   Problem: Pain Managment: Goal: General experience of comfort will improve Outcome: Progressing   Problem: Safety: Goal: Ability to remain free from injury will improve Outcome: Progressing   Problem: Skin Integrity: Goal: Risk for impaired skin integrity will decrease Outcome: Progressing

## 2018-03-29 DIAGNOSIS — I119 Hypertensive heart disease without heart failure: Secondary | ICD-10-CM | POA: Diagnosis not present

## 2018-03-29 DIAGNOSIS — Z48815 Encounter for surgical aftercare following surgery on the digestive system: Secondary | ICD-10-CM | POA: Diagnosis not present

## 2018-03-29 DIAGNOSIS — I442 Atrioventricular block, complete: Secondary | ICD-10-CM | POA: Diagnosis not present

## 2018-03-29 DIAGNOSIS — E119 Type 2 diabetes mellitus without complications: Secondary | ICD-10-CM | POA: Diagnosis not present

## 2018-03-29 DIAGNOSIS — M4800 Spinal stenosis, site unspecified: Secondary | ICD-10-CM | POA: Diagnosis not present

## 2018-03-29 DIAGNOSIS — Z794 Long term (current) use of insulin: Secondary | ICD-10-CM | POA: Diagnosis not present

## 2018-03-29 DIAGNOSIS — I050 Rheumatic mitral stenosis: Secondary | ICD-10-CM | POA: Diagnosis not present

## 2018-03-29 DIAGNOSIS — Z9181 History of falling: Secondary | ICD-10-CM | POA: Diagnosis not present

## 2018-03-29 DIAGNOSIS — Z8543 Personal history of malignant neoplasm of ovary: Secondary | ICD-10-CM | POA: Diagnosis not present

## 2018-03-29 DIAGNOSIS — B9681 Helicobacter pylori [H. pylori] as the cause of diseases classified elsewhere: Secondary | ICD-10-CM | POA: Diagnosis not present

## 2018-03-29 DIAGNOSIS — K261 Acute duodenal ulcer with perforation: Secondary | ICD-10-CM | POA: Diagnosis not present

## 2018-03-29 DIAGNOSIS — R001 Bradycardia, unspecified: Secondary | ICD-10-CM | POA: Diagnosis not present

## 2018-03-29 DIAGNOSIS — Z952 Presence of prosthetic heart valve: Secondary | ICD-10-CM | POA: Diagnosis not present

## 2018-03-29 DIAGNOSIS — Z95810 Presence of automatic (implantable) cardiac defibrillator: Secondary | ICD-10-CM | POA: Diagnosis not present

## 2018-03-29 DIAGNOSIS — Z853 Personal history of malignant neoplasm of breast: Secondary | ICD-10-CM | POA: Diagnosis not present

## 2018-03-30 DIAGNOSIS — K261 Acute duodenal ulcer with perforation: Secondary | ICD-10-CM | POA: Diagnosis not present

## 2018-03-30 DIAGNOSIS — B9681 Helicobacter pylori [H. pylori] as the cause of diseases classified elsewhere: Secondary | ICD-10-CM | POA: Diagnosis not present

## 2018-03-30 DIAGNOSIS — I119 Hypertensive heart disease without heart failure: Secondary | ICD-10-CM | POA: Diagnosis not present

## 2018-03-30 DIAGNOSIS — I050 Rheumatic mitral stenosis: Secondary | ICD-10-CM | POA: Diagnosis not present

## 2018-03-30 DIAGNOSIS — E119 Type 2 diabetes mellitus without complications: Secondary | ICD-10-CM | POA: Diagnosis not present

## 2018-03-30 DIAGNOSIS — Z48815 Encounter for surgical aftercare following surgery on the digestive system: Secondary | ICD-10-CM | POA: Diagnosis not present

## 2018-04-01 ENCOUNTER — Other Ambulatory Visit: Payer: Self-pay | Admitting: *Deleted

## 2018-04-01 ENCOUNTER — Other Ambulatory Visit (HOSPITAL_COMMUNITY): Payer: Self-pay | Admitting: Physician Assistant

## 2018-04-01 DIAGNOSIS — E119 Type 2 diabetes mellitus without complications: Secondary | ICD-10-CM | POA: Diagnosis not present

## 2018-04-01 DIAGNOSIS — I050 Rheumatic mitral stenosis: Secondary | ICD-10-CM | POA: Diagnosis not present

## 2018-04-01 DIAGNOSIS — Z48815 Encounter for surgical aftercare following surgery on the digestive system: Secondary | ICD-10-CM | POA: Diagnosis not present

## 2018-04-01 DIAGNOSIS — K261 Acute duodenal ulcer with perforation: Secondary | ICD-10-CM | POA: Diagnosis not present

## 2018-04-01 DIAGNOSIS — I119 Hypertensive heart disease without heart failure: Secondary | ICD-10-CM | POA: Diagnosis not present

## 2018-04-01 DIAGNOSIS — B9681 Helicobacter pylori [H. pylori] as the cause of diseases classified elsewhere: Secondary | ICD-10-CM | POA: Diagnosis not present

## 2018-04-01 NOTE — Consult Note (Signed)
Made aware by Tommi Rumps with [facility] that patient enrolle d in the [place] program and will likely have transportation needs.  Will send notification to [place] [facility] office to make aware of patient's enrollment with [place], MSN-Ed, RN,BSN [facility] Liaison [phone]

## 2018-04-02 DIAGNOSIS — I050 Rheumatic mitral stenosis: Secondary | ICD-10-CM | POA: Diagnosis not present

## 2018-04-02 DIAGNOSIS — K261 Acute duodenal ulcer with perforation: Secondary | ICD-10-CM | POA: Diagnosis not present

## 2018-04-02 DIAGNOSIS — I119 Hypertensive heart disease without heart failure: Secondary | ICD-10-CM | POA: Diagnosis not present

## 2018-04-02 DIAGNOSIS — B9681 Helicobacter pylori [H. pylori] as the cause of diseases classified elsewhere: Secondary | ICD-10-CM | POA: Diagnosis not present

## 2018-04-02 DIAGNOSIS — E119 Type 2 diabetes mellitus without complications: Secondary | ICD-10-CM | POA: Diagnosis not present

## 2018-04-02 DIAGNOSIS — Z48815 Encounter for surgical aftercare following surgery on the digestive system: Secondary | ICD-10-CM | POA: Diagnosis not present

## 2018-04-03 DIAGNOSIS — I050 Rheumatic mitral stenosis: Secondary | ICD-10-CM | POA: Diagnosis not present

## 2018-04-03 DIAGNOSIS — I119 Hypertensive heart disease without heart failure: Secondary | ICD-10-CM | POA: Diagnosis not present

## 2018-04-03 DIAGNOSIS — B9681 Helicobacter pylori [H. pylori] as the cause of diseases classified elsewhere: Secondary | ICD-10-CM | POA: Diagnosis not present

## 2018-04-03 DIAGNOSIS — K261 Acute duodenal ulcer with perforation: Secondary | ICD-10-CM | POA: Diagnosis not present

## 2018-04-03 DIAGNOSIS — Z48815 Encounter for surgical aftercare following surgery on the digestive system: Secondary | ICD-10-CM | POA: Diagnosis not present

## 2018-04-03 DIAGNOSIS — E119 Type 2 diabetes mellitus without complications: Secondary | ICD-10-CM | POA: Diagnosis not present

## 2018-04-04 DIAGNOSIS — E1122 Type 2 diabetes mellitus with diabetic chronic kidney disease: Secondary | ICD-10-CM | POA: Diagnosis not present

## 2018-04-04 DIAGNOSIS — K265 Chronic or unspecified duodenal ulcer with perforation: Secondary | ICD-10-CM | POA: Diagnosis not present

## 2018-04-04 DIAGNOSIS — E538 Deficiency of other specified B group vitamins: Secondary | ICD-10-CM | POA: Diagnosis not present

## 2018-04-04 DIAGNOSIS — E878 Other disorders of electrolyte and fluid balance, not elsewhere classified: Secondary | ICD-10-CM | POA: Diagnosis not present

## 2018-04-04 DIAGNOSIS — E612 Magnesium deficiency: Secondary | ICD-10-CM | POA: Diagnosis not present

## 2018-04-04 DIAGNOSIS — Z794 Long term (current) use of insulin: Secondary | ICD-10-CM | POA: Diagnosis not present

## 2018-04-04 DIAGNOSIS — R7989 Other specified abnormal findings of blood chemistry: Secondary | ICD-10-CM | POA: Diagnosis not present

## 2018-04-09 DIAGNOSIS — I050 Rheumatic mitral stenosis: Secondary | ICD-10-CM | POA: Diagnosis not present

## 2018-04-09 DIAGNOSIS — Z48815 Encounter for surgical aftercare following surgery on the digestive system: Secondary | ICD-10-CM | POA: Diagnosis not present

## 2018-04-09 DIAGNOSIS — E119 Type 2 diabetes mellitus without complications: Secondary | ICD-10-CM | POA: Diagnosis not present

## 2018-04-09 DIAGNOSIS — B9681 Helicobacter pylori [H. pylori] as the cause of diseases classified elsewhere: Secondary | ICD-10-CM | POA: Diagnosis not present

## 2018-04-09 DIAGNOSIS — I119 Hypertensive heart disease without heart failure: Secondary | ICD-10-CM | POA: Diagnosis not present

## 2018-04-09 DIAGNOSIS — K261 Acute duodenal ulcer with perforation: Secondary | ICD-10-CM | POA: Diagnosis not present

## 2018-04-10 DIAGNOSIS — I119 Hypertensive heart disease without heart failure: Secondary | ICD-10-CM | POA: Diagnosis not present

## 2018-04-10 DIAGNOSIS — K261 Acute duodenal ulcer with perforation: Secondary | ICD-10-CM | POA: Diagnosis not present

## 2018-04-10 DIAGNOSIS — I050 Rheumatic mitral stenosis: Secondary | ICD-10-CM | POA: Diagnosis not present

## 2018-04-10 DIAGNOSIS — B9681 Helicobacter pylori [H. pylori] as the cause of diseases classified elsewhere: Secondary | ICD-10-CM | POA: Diagnosis not present

## 2018-04-10 DIAGNOSIS — E119 Type 2 diabetes mellitus without complications: Secondary | ICD-10-CM | POA: Diagnosis not present

## 2018-04-10 DIAGNOSIS — Z48815 Encounter for surgical aftercare following surgery on the digestive system: Secondary | ICD-10-CM | POA: Diagnosis not present

## 2018-04-11 DIAGNOSIS — K261 Acute duodenal ulcer with perforation: Secondary | ICD-10-CM | POA: Diagnosis not present

## 2018-04-11 DIAGNOSIS — E119 Type 2 diabetes mellitus without complications: Secondary | ICD-10-CM | POA: Diagnosis not present

## 2018-04-11 DIAGNOSIS — I050 Rheumatic mitral stenosis: Secondary | ICD-10-CM | POA: Diagnosis not present

## 2018-04-11 DIAGNOSIS — I119 Hypertensive heart disease without heart failure: Secondary | ICD-10-CM | POA: Diagnosis not present

## 2018-04-11 DIAGNOSIS — B9681 Helicobacter pylori [H. pylori] as the cause of diseases classified elsewhere: Secondary | ICD-10-CM | POA: Diagnosis not present

## 2018-04-11 DIAGNOSIS — Z48815 Encounter for surgical aftercare following surgery on the digestive system: Secondary | ICD-10-CM | POA: Diagnosis not present

## 2018-04-12 DIAGNOSIS — R790 Abnormal level of blood mineral: Secondary | ICD-10-CM | POA: Diagnosis not present

## 2018-04-12 DIAGNOSIS — R7989 Other specified abnormal findings of blood chemistry: Secondary | ICD-10-CM | POA: Diagnosis not present

## 2018-04-15 DIAGNOSIS — Z48815 Encounter for surgical aftercare following surgery on the digestive system: Secondary | ICD-10-CM | POA: Diagnosis not present

## 2018-04-15 DIAGNOSIS — I050 Rheumatic mitral stenosis: Secondary | ICD-10-CM | POA: Diagnosis not present

## 2018-04-15 DIAGNOSIS — I119 Hypertensive heart disease without heart failure: Secondary | ICD-10-CM | POA: Diagnosis not present

## 2018-04-15 DIAGNOSIS — B9681 Helicobacter pylori [H. pylori] as the cause of diseases classified elsewhere: Secondary | ICD-10-CM | POA: Diagnosis not present

## 2018-04-15 DIAGNOSIS — K261 Acute duodenal ulcer with perforation: Secondary | ICD-10-CM | POA: Diagnosis not present

## 2018-04-15 DIAGNOSIS — E119 Type 2 diabetes mellitus without complications: Secondary | ICD-10-CM | POA: Diagnosis not present

## 2018-04-18 DIAGNOSIS — I050 Rheumatic mitral stenosis: Secondary | ICD-10-CM | POA: Diagnosis not present

## 2018-04-18 DIAGNOSIS — I119 Hypertensive heart disease without heart failure: Secondary | ICD-10-CM | POA: Diagnosis not present

## 2018-04-18 DIAGNOSIS — E119 Type 2 diabetes mellitus without complications: Secondary | ICD-10-CM | POA: Diagnosis not present

## 2018-04-18 DIAGNOSIS — K261 Acute duodenal ulcer with perforation: Secondary | ICD-10-CM | POA: Diagnosis not present

## 2018-04-18 DIAGNOSIS — B9681 Helicobacter pylori [H. pylori] as the cause of diseases classified elsewhere: Secondary | ICD-10-CM | POA: Diagnosis not present

## 2018-04-18 DIAGNOSIS — Z48815 Encounter for surgical aftercare following surgery on the digestive system: Secondary | ICD-10-CM | POA: Diagnosis not present

## 2018-04-19 DIAGNOSIS — R790 Abnormal level of blood mineral: Secondary | ICD-10-CM | POA: Diagnosis not present

## 2018-04-22 ENCOUNTER — Ambulatory Visit (INDEPENDENT_AMBULATORY_CARE_PROVIDER_SITE_OTHER): Payer: Medicare Other

## 2018-04-22 DIAGNOSIS — M21962 Unspecified acquired deformity of left lower leg: Secondary | ICD-10-CM | POA: Diagnosis not present

## 2018-04-22 DIAGNOSIS — E1351 Other specified diabetes mellitus with diabetic peripheral angiopathy without gangrene: Secondary | ICD-10-CM | POA: Diagnosis not present

## 2018-04-22 DIAGNOSIS — M21961 Unspecified acquired deformity of right lower leg: Secondary | ICD-10-CM | POA: Diagnosis not present

## 2018-04-22 DIAGNOSIS — L602 Onychogryphosis: Secondary | ICD-10-CM | POA: Diagnosis not present

## 2018-04-22 DIAGNOSIS — I442 Atrioventricular block, complete: Secondary | ICD-10-CM

## 2018-04-22 DIAGNOSIS — E1143 Type 2 diabetes mellitus with diabetic autonomic (poly)neuropathy: Secondary | ICD-10-CM | POA: Diagnosis not present

## 2018-04-23 DIAGNOSIS — E119 Type 2 diabetes mellitus without complications: Secondary | ICD-10-CM | POA: Diagnosis not present

## 2018-04-23 DIAGNOSIS — K261 Acute duodenal ulcer with perforation: Secondary | ICD-10-CM | POA: Diagnosis not present

## 2018-04-23 DIAGNOSIS — B9681 Helicobacter pylori [H. pylori] as the cause of diseases classified elsewhere: Secondary | ICD-10-CM | POA: Diagnosis not present

## 2018-04-23 DIAGNOSIS — I119 Hypertensive heart disease without heart failure: Secondary | ICD-10-CM | POA: Diagnosis not present

## 2018-04-23 DIAGNOSIS — I050 Rheumatic mitral stenosis: Secondary | ICD-10-CM | POA: Diagnosis not present

## 2018-04-23 DIAGNOSIS — Z48815 Encounter for surgical aftercare following surgery on the digestive system: Secondary | ICD-10-CM | POA: Diagnosis not present

## 2018-04-24 ENCOUNTER — Telehealth: Payer: Self-pay

## 2018-04-24 LAB — CUP PACEART REMOTE DEVICE CHECK
Battery Remaining Longevity: 120 mo
Battery Remaining Percentage: 95.5 %
Battery Voltage: 3.01 V
Brady Statistic AP VP Percent: 5 %
Brady Statistic AP VS Percent: 3.1 %
Brady Statistic AS VP Percent: 65 %
Brady Statistic AS VS Percent: 26 %
Brady Statistic RA Percent Paced: 6.9 %
Date Time Interrogation Session: 20200218171530
Implantable Lead Implant Date: 20180216
Implantable Lead Implant Date: 20180216
Implantable Lead Location: 753859
Implantable Lead Location: 753860
Implantable Pulse Generator Implant Date: 20180216
Lead Channel Impedance Value: 430 Ohm
Lead Channel Impedance Value: 440 Ohm
Lead Channel Pacing Threshold Amplitude: 0.75 V
Lead Channel Pacing Threshold Amplitude: 1 V
Lead Channel Pacing Threshold Pulse Width: 0.5 ms
Lead Channel Pacing Threshold Pulse Width: 0.5 ms
Lead Channel Sensing Intrinsic Amplitude: 3.3 mV
Lead Channel Sensing Intrinsic Amplitude: 3.9 mV
Lead Channel Setting Pacing Amplitude: 1.25 V
Lead Channel Setting Pacing Amplitude: 2 V
Lead Channel Setting Pacing Pulse Width: 0.5 ms
Lead Channel Setting Sensing Sensitivity: 2.5 mV
MDC IDC PG SERIAL: 7998131
MDC IDC STAT BRADY RV PERCENT PACED: 70 %

## 2018-04-24 NOTE — Telephone Encounter (Signed)
Spoke with patient to remind of missed remote transmission 

## 2018-04-28 DIAGNOSIS — M4800 Spinal stenosis, site unspecified: Secondary | ICD-10-CM | POA: Diagnosis not present

## 2018-04-28 DIAGNOSIS — K261 Acute duodenal ulcer with perforation: Secondary | ICD-10-CM | POA: Diagnosis not present

## 2018-04-28 DIAGNOSIS — I442 Atrioventricular block, complete: Secondary | ICD-10-CM | POA: Diagnosis not present

## 2018-04-28 DIAGNOSIS — Z9181 History of falling: Secondary | ICD-10-CM | POA: Diagnosis not present

## 2018-04-28 DIAGNOSIS — Z853 Personal history of malignant neoplasm of breast: Secondary | ICD-10-CM | POA: Diagnosis not present

## 2018-04-28 DIAGNOSIS — I119 Hypertensive heart disease without heart failure: Secondary | ICD-10-CM | POA: Diagnosis not present

## 2018-04-28 DIAGNOSIS — Z952 Presence of prosthetic heart valve: Secondary | ICD-10-CM | POA: Diagnosis not present

## 2018-04-28 DIAGNOSIS — Z794 Long term (current) use of insulin: Secondary | ICD-10-CM | POA: Diagnosis not present

## 2018-04-28 DIAGNOSIS — B9681 Helicobacter pylori [H. pylori] as the cause of diseases classified elsewhere: Secondary | ICD-10-CM | POA: Diagnosis not present

## 2018-04-28 DIAGNOSIS — Z8543 Personal history of malignant neoplasm of ovary: Secondary | ICD-10-CM | POA: Diagnosis not present

## 2018-04-28 DIAGNOSIS — Z95810 Presence of automatic (implantable) cardiac defibrillator: Secondary | ICD-10-CM | POA: Diagnosis not present

## 2018-04-28 DIAGNOSIS — R001 Bradycardia, unspecified: Secondary | ICD-10-CM | POA: Diagnosis not present

## 2018-04-28 DIAGNOSIS — Z48815 Encounter for surgical aftercare following surgery on the digestive system: Secondary | ICD-10-CM | POA: Diagnosis not present

## 2018-04-28 DIAGNOSIS — I050 Rheumatic mitral stenosis: Secondary | ICD-10-CM | POA: Diagnosis not present

## 2018-04-28 DIAGNOSIS — E119 Type 2 diabetes mellitus without complications: Secondary | ICD-10-CM | POA: Diagnosis not present

## 2018-04-29 DIAGNOSIS — K261 Acute duodenal ulcer with perforation: Secondary | ICD-10-CM | POA: Diagnosis not present

## 2018-04-29 DIAGNOSIS — I050 Rheumatic mitral stenosis: Secondary | ICD-10-CM | POA: Diagnosis not present

## 2018-04-29 DIAGNOSIS — B9681 Helicobacter pylori [H. pylori] as the cause of diseases classified elsewhere: Secondary | ICD-10-CM | POA: Diagnosis not present

## 2018-04-29 DIAGNOSIS — I119 Hypertensive heart disease without heart failure: Secondary | ICD-10-CM | POA: Diagnosis not present

## 2018-04-29 DIAGNOSIS — E119 Type 2 diabetes mellitus without complications: Secondary | ICD-10-CM | POA: Diagnosis not present

## 2018-04-29 DIAGNOSIS — Z48815 Encounter for surgical aftercare following surgery on the digestive system: Secondary | ICD-10-CM | POA: Diagnosis not present

## 2018-05-01 DIAGNOSIS — I050 Rheumatic mitral stenosis: Secondary | ICD-10-CM | POA: Diagnosis not present

## 2018-05-01 DIAGNOSIS — Z48815 Encounter for surgical aftercare following surgery on the digestive system: Secondary | ICD-10-CM | POA: Diagnosis not present

## 2018-05-01 DIAGNOSIS — K261 Acute duodenal ulcer with perforation: Secondary | ICD-10-CM | POA: Diagnosis not present

## 2018-05-01 DIAGNOSIS — E119 Type 2 diabetes mellitus without complications: Secondary | ICD-10-CM | POA: Diagnosis not present

## 2018-05-01 DIAGNOSIS — B9681 Helicobacter pylori [H. pylori] as the cause of diseases classified elsewhere: Secondary | ICD-10-CM | POA: Diagnosis not present

## 2018-05-01 DIAGNOSIS — I119 Hypertensive heart disease without heart failure: Secondary | ICD-10-CM | POA: Diagnosis not present

## 2018-05-01 NOTE — Progress Notes (Signed)
Remote pacemaker transmission.   

## 2018-05-03 DIAGNOSIS — B9681 Helicobacter pylori [H. pylori] as the cause of diseases classified elsewhere: Secondary | ICD-10-CM | POA: Diagnosis not present

## 2018-05-03 DIAGNOSIS — Z48815 Encounter for surgical aftercare following surgery on the digestive system: Secondary | ICD-10-CM | POA: Diagnosis not present

## 2018-05-03 DIAGNOSIS — I119 Hypertensive heart disease without heart failure: Secondary | ICD-10-CM | POA: Diagnosis not present

## 2018-05-03 DIAGNOSIS — K261 Acute duodenal ulcer with perforation: Secondary | ICD-10-CM | POA: Diagnosis not present

## 2018-05-03 DIAGNOSIS — I050 Rheumatic mitral stenosis: Secondary | ICD-10-CM | POA: Diagnosis not present

## 2018-05-03 DIAGNOSIS — E119 Type 2 diabetes mellitus without complications: Secondary | ICD-10-CM | POA: Diagnosis not present

## 2018-05-07 DIAGNOSIS — K261 Acute duodenal ulcer with perforation: Secondary | ICD-10-CM | POA: Diagnosis not present

## 2018-05-07 DIAGNOSIS — Z48815 Encounter for surgical aftercare following surgery on the digestive system: Secondary | ICD-10-CM | POA: Diagnosis not present

## 2018-05-07 DIAGNOSIS — I050 Rheumatic mitral stenosis: Secondary | ICD-10-CM | POA: Diagnosis not present

## 2018-05-07 DIAGNOSIS — I119 Hypertensive heart disease without heart failure: Secondary | ICD-10-CM | POA: Diagnosis not present

## 2018-05-07 DIAGNOSIS — E119 Type 2 diabetes mellitus without complications: Secondary | ICD-10-CM | POA: Diagnosis not present

## 2018-05-07 DIAGNOSIS — B9681 Helicobacter pylori [H. pylori] as the cause of diseases classified elsewhere: Secondary | ICD-10-CM | POA: Diagnosis not present

## 2018-05-08 DIAGNOSIS — E119 Type 2 diabetes mellitus without complications: Secondary | ICD-10-CM | POA: Diagnosis not present

## 2018-05-08 DIAGNOSIS — I119 Hypertensive heart disease without heart failure: Secondary | ICD-10-CM | POA: Diagnosis not present

## 2018-05-08 DIAGNOSIS — B9681 Helicobacter pylori [H. pylori] as the cause of diseases classified elsewhere: Secondary | ICD-10-CM | POA: Diagnosis not present

## 2018-05-08 DIAGNOSIS — I050 Rheumatic mitral stenosis: Secondary | ICD-10-CM | POA: Diagnosis not present

## 2018-05-08 DIAGNOSIS — Z48815 Encounter for surgical aftercare following surgery on the digestive system: Secondary | ICD-10-CM | POA: Diagnosis not present

## 2018-05-08 DIAGNOSIS — K261 Acute duodenal ulcer with perforation: Secondary | ICD-10-CM | POA: Diagnosis not present

## 2018-05-10 ENCOUNTER — Encounter: Payer: Medicare Other | Admitting: Cardiology

## 2018-05-14 DIAGNOSIS — I119 Hypertensive heart disease without heart failure: Secondary | ICD-10-CM | POA: Diagnosis not present

## 2018-05-14 DIAGNOSIS — K261 Acute duodenal ulcer with perforation: Secondary | ICD-10-CM | POA: Diagnosis not present

## 2018-05-14 DIAGNOSIS — E119 Type 2 diabetes mellitus without complications: Secondary | ICD-10-CM | POA: Diagnosis not present

## 2018-05-14 DIAGNOSIS — B9681 Helicobacter pylori [H. pylori] as the cause of diseases classified elsewhere: Secondary | ICD-10-CM | POA: Diagnosis not present

## 2018-05-14 DIAGNOSIS — Z48815 Encounter for surgical aftercare following surgery on the digestive system: Secondary | ICD-10-CM | POA: Diagnosis not present

## 2018-05-14 DIAGNOSIS — I050 Rheumatic mitral stenosis: Secondary | ICD-10-CM | POA: Diagnosis not present

## 2018-05-23 ENCOUNTER — Encounter: Payer: Self-pay | Admitting: *Deleted

## 2018-05-23 ENCOUNTER — Encounter: Payer: Self-pay | Admitting: Cardiology

## 2018-05-23 DIAGNOSIS — C569 Malignant neoplasm of unspecified ovary: Secondary | ICD-10-CM | POA: Insufficient documentation

## 2018-05-23 DIAGNOSIS — N200 Calculus of kidney: Secondary | ICD-10-CM | POA: Insufficient documentation

## 2018-05-23 DIAGNOSIS — E119 Type 2 diabetes mellitus without complications: Secondary | ICD-10-CM | POA: Insufficient documentation

## 2018-05-23 DIAGNOSIS — C50212 Malignant neoplasm of upper-inner quadrant of left female breast: Secondary | ICD-10-CM | POA: Insufficient documentation

## 2018-05-23 DIAGNOSIS — Z86718 Personal history of other venous thrombosis and embolism: Secondary | ICD-10-CM | POA: Insufficient documentation

## 2018-05-31 ENCOUNTER — Encounter: Payer: Medicare Other | Admitting: Cardiology

## 2018-06-24 ENCOUNTER — Telehealth: Payer: Self-pay | Admitting: Physician Assistant

## 2018-06-24 ENCOUNTER — Other Ambulatory Visit: Payer: Self-pay | Admitting: Physician Assistant

## 2018-06-24 DIAGNOSIS — Z952 Presence of prosthetic heart valve: Secondary | ICD-10-CM

## 2018-06-24 NOTE — Telephone Encounter (Signed)
HEART AND VASCULAR CENTER   MULTIDISCIPLINARY HEART VALVE TEAM    TELEPHONE CALL NOTE CONSENT  [name] has been deemed a candidate for a follow-up tele-health visit to limit community exposure during the Covid-19 pandemic. I spoke with the patient via phone to ensure availability of phone/video source, confirm preferred email & phone number, and discuss instructions and expectations.  I reminded [name] to be prepared with any vital sign and/or heart rhythm information that could potentially be obtained via home monitoring, at the time of her visit. I reminded [name] to expect a phone call prior to her visit.  Angelena Form, PA-C 06/24/2018 3:51 PM   INSTRUCTIONS FOR DOWNLOADING THE MYCHART APP TO SMARTPHONE  - The patient must first make sure to have activated MyChart and know their login information - If Apple, go to [employer] and type in MyChart in the search bar and download the app. If Android, ask patient to go to [employer] and type in [place] in the search bar and download the app. The app is free but as with any other app downloads, their phone may require them to verify saved payment information or Apple/Android password.  - The patient will need to then log into the app with their MyChart username and password, and select  as their healthcare provider to link the account. When it is time for your visit, go to the MyChart app, find appointments, and click Begin Video Visit. Be sure to Select Allow for your device to access the Microphone and Camera for your visit. You will then be connected, and your provider will be with you shortly.  **If they have any issues connecting, or need assistance please contact MyChart service desk [phone])**  **If using a computer, in order to ensure the best quality for their visit they will need to use either of the following Internet Browsers: [employer], or Google Chrome**  IF USING  DOXIMITY or DOXY.ME - The patient will receive a link just prior to their visit by text.     FULL LENGTH CONSENT FOR TELE-HEALTH VISIT   I hereby voluntarily request, consent and authorize [facility] and its employed or contracted physicians, physician assistants, nurse practitioners or other licensed health care professionals (the Practitioner), to provide me with telemedicine health care services (the "Services") as deemed necessary by the treating Practitioner. I acknowledge and consent to receive the Services by the Practitioner via telemedicine. I understand that the telemedicine visit will involve communicating with the Practitioner through live audiovisual communication technology and the disclosure of certain medical information by electronic transmission. I acknowledge that I have been given the opportunity to request an in-person assessment or other available alternative prior to the telemedicine visit and am voluntarily participating in the telemedicine visit.  I understand that I have the right to withhold or withdraw my consent to the use of telemedicine in the course of my care at any time, without affecting my right to future care or treatment, and that the Practitioner or I may terminate the telemedicine visit at any time. I understand that I have the right to inspect all information obtained and/or recorded in the course of the telemedicine visit and may receive copies of available information for a reasonable fee.  I understand that some of the potential risks of receiving the Services via telemedicine include:  [name] Kitchen Delay or interruption in medical evaluation due to technological equipment failure or disruption; . Information transmitted may not be sufficient (  e.g. poor resolution of images) to allow for appropriate medical decision making by the Practitioner; and/or  . In rare instances, security protocols could fail, causing a breach of personal health information.  Furthermore, I  acknowledge that it is my responsibility to provide information about my medical history, conditions and care that is complete and accurate to the best of my ability. I acknowledge that Practitioner's advice, recommendations, and/or decision may be based on factors not within their control, such as incomplete or inaccurate data provided by me or distortions of diagnostic images or specimens that may result from electronic transmissions. I understand that the practice of medicine is not an exact science and that Practitioner makes no warranties or guarantees regarding treatment outcomes. I acknowledge that I will receive a copy of this consent concurrently upon execution via email to the email address I last provided but may also request a printed copy by calling the office of [facility].    I understand that my insurance will be billed for this visit.   I have read or had this consent read to me. . I understand the contents of this consent, which adequately explains the benefits and risks of the Services being provided via telemedicine.  . I have been provided ample opportunity to ask questions regarding this consent and the Services and have had my questions answered to my satisfaction. . I give my informed consent for the services to be provided through the use of telemedicine in my medical care  By participating in this telemedicine visit I agree to the above.

## 2018-06-25 ENCOUNTER — Telehealth (INDEPENDENT_AMBULATORY_CARE_PROVIDER_SITE_OTHER): Payer: Medicare Other | Admitting: Physician Assistant

## 2018-06-25 ENCOUNTER — Other Ambulatory Visit: Payer: Self-pay

## 2018-06-25 DIAGNOSIS — Z952 Presence of prosthetic heart valve: Secondary | ICD-10-CM

## 2018-06-25 DIAGNOSIS — I10 Essential (primary) hypertension: Secondary | ICD-10-CM

## 2018-06-25 DIAGNOSIS — Z7189 Other specified counseling: Secondary | ICD-10-CM

## 2018-06-25 DIAGNOSIS — Z950 Presence of cardiac pacemaker: Secondary | ICD-10-CM

## 2018-06-25 DIAGNOSIS — I350 Nonrheumatic aortic (valve) stenosis: Secondary | ICD-10-CM

## 2018-06-25 MED ORDER — AMOXICILLIN 500 MG PO TABS: 2000.0000 mg | ORAL_TABLET | ORAL | 12 refills | Status: DC

## 2018-06-25 NOTE — Patient Instructions (Addendum)
Hello Ms Parveen,   It was so nice to talk to you on the audio/video chat today. I am so glad you are doing so well. I just wanted to send you a recap of our discussion.   Please continue taking antibiotics prior to any dental work including cleanings (amoxicillin) . Please continue on all your same medications.    Your 1 year echo has been rescheduled until August. Please continue regular follow up with you primary cardiologist.   All appointment details are listed in upcoming appointments in MyChart or attached to this letter in your "after visit summary."  Please call us with any questions or concerns you may have and please stay safe during these uncertain times.  Nell Range

## 2018-06-25 NOTE — Progress Notes (Signed)
HEART AND VASCULAR CENTER   MULTIDISCIPLINARY HEART VALVE TEAM     Virtual Visit via Video Note   This visit type was conducted due to national recommendations for restrictions regarding the COVID-19 Pandemic (e.g. social distancing) in an effort to limit this patient's exposure and mitigate transmission in our community.  Due to her co-morbid illnesses, this patient is at least at moderate risk for complications without adequate follow up.  This format is felt to be most appropriate for this patient at this time.  All issues noted in this document were discussed and addressed.  A limited physical exam was performed with this format.  Please refer to the patient's chart for her consent to telehealth for [facility].   Evaluation Performed:  Follow-up visit  Date:  06/25/2018   ID:  [name], DOB [birth year], MRN [id number]  Patient Location: Home Provider Location: Office  PCP:  Leighton Ruff, MD  Cardiologist: Dr. Oval Linsey / Dr. Burt Knack &Dr.Owen (TAVR)  Electrophysiologist: Dr. Curt Bears   Chief Complaint:  1 year s/p TAVR   History of Present Illness:    [name] is a 83 y.o. female with CHB s/p PPM (2018), HTN,prior DVT/PE,morbid obesity,chronic diastolic CHF, DMT2, ovarian/breast cancer, decreased physical mobility due to advanced arthritis and severe AS s/p TAVR (07/17/17) who presents for follow up.   The patient does not have symptoms concerning for COVID-19 infection (fever, chills, cough, or new shortness of breath).   She was diagnosed with severe aortic stenosis last year. She was referred to the multidisciplinary heart valve clinic and underwent diagnostic cardiac catheterization by Dr. Burt Knack on 06/13/17 and found to have widely patent coronary arteries with no significant coronary artery disease. There was mild pulmonary hypertension with preserved cardiac output. She underwent successful TAVR with a 26 mm EdwardsSapienTHVvia the TF approach on  07/17/2017.Postoperative echo showed EF 65%, normally functioning TAVR valve with no PVL and mean gradient of 27mmHg. She had postop volume overload and was diuresed with IV Lasix. She was discharged on aspirin and Plavix. 1 month echo showed EF 65%, normally functioning TAVR valve with mean gradient 16 mm Hg. Of note, she had enlarged lymph nodes on pre TAVR CT. Follow up CT in 09/2017 showed stable enlargement and felt to be reactive 2/2 body habitus.   She was admitted in 03/2018 for a perforated duodenal ulcer. She was found to have Hpylori and started on triple therapy. Her aspirin was discontinued.   Today she presents for a telehealth visit. She is doing quite well. No CP or SOB. She has chronic 2+ LE edema, but no orthopnea or PND. No dizziness or syncope. No blood in stool or urine. No palpitations. She is mostly limited by her painful arthritis and no longer able to take NSAIDS given her recent perforated ulcer. She has been having a nice time despite being on quarantine with her sister.    Past Medical History:  Diagnosis Date   Anxiety    Aortic stenosis    Arthritis    Breast cancer ([place])    CANCER   Diabetes mellitus    Type II   Dyspnea    Heart murmur    History of chemotherapy    History of kidney stones    Hypertension    Mitral stenosis    Morbid obesity (HCC)    BMI > 40   Ovarian cancer ([place])    Presence of permanent cardiac pacemaker    [employer]. [employer]   Pulmonary embolism ([place]) 2013  post op   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   26 mm Edwards Sapien 3 transcatheter heart valve placed via percutaneous right transfemoral approach    Spinal stenosis    Past Surgical History:  Procedure Laterality Date   ABDOMINAL HYSTERECTOMY  2013   BREAST SURGERY  03-2004   lumpectomy    CARDIAC CATHETERIZATION     INSERT / REPLACE / [place] ECHOCARDIOGRAM  07/17/2017   Procedure:  INTRAOPERATIVE TRANSTHORACIC ECHOCARDIOGRAM;  Surgeon: Sherren Mocha, MD;  Location: [facility];  Service: Open Heart Surgery;;   LAPAROTOMY N/A 03/22/2018   Procedure: Jenetta Downer pouch, repair of bowel perforation;  Surgeon: Mickeal Skinner, MD;  Location: [facility];  Service: General;  Laterality: N/A;   LITHOTRIPSY     PACEMAKER IMPLANT N/A 04/21/2016   Procedure: Pacemaker Implant;  Surgeon: Will Meredith Leeds, MD;  Location: [facility];  Service: Cardiovascular;  Laterality: N/A;   TONSILLECTOMY     TRANSCATHETER AORTIC VALVE REPLACEMENT, TRANSFEMORAL N/A 07/17/2017   Procedure: TRANSCATHETER AORTIC VALVE REPLACEMENT, TRANSFEMORAL;  Surgeon: Sherren Mocha, MD;  Location: [facility];  Service: Open Heart Surgery;  Laterality: N/A;     No outpatient medications have been marked as taking for the 06/25/18 encounter (Telemedicine) with Eileen Stanford, PA-C.     Allergies:   Nsaids and Tape   Social History   Tobacco Use   Smoking status: Former Smoker    Packs/day: 1.00    Years: 5.00    Pack years: 5.00    Types: Cigarettes   Smokeless tobacco: Never Used  Substance Use Topics   Alcohol use: No   Drug use: No     Family Hx: The patient's family history includes Bladder Cancer in her father; Cancer in her brother and sister; Heart failure in her mother.  ROS:   Please see the history of present illness.    All other systems reviewed and are negative.   Prior CV studies:   The following studies were reviewed today:  TAVR OPERATIVE NOTE   Date of Procedure:07/17/2017  Preoperative Diagnosis:Severe Aortic Stenosis   Procedure:   Transcatheter Aortic Valve Replacement - PercutaneousPercutaneous RightTransfemoral Approach Edwards Sapien 3 THV (size 32mm, model # 9600TFX, serial # [id number])  Co-Surgeons:Clarence H. Roxy Manns, MD and Sherren Mocha, MD  Pre-operative Echo  Findings: ? Severe aortic stenosis ? Normalleft ventricular systolic function  Post-operative Echo Findings: ? Mildparavalvular leak ? Normalleft ventricular systolic function  _____________________   2D ECHO 08/22/17 ( 30 day s/p TAVR) Study Conclusions - Catheterization with cardiac intervention. TAVR performed. 26 mm Edwards Sapien - Left ventricle: The cavity size was normal. Wall thickness was increased in a pattern of mild LVH. Systolic function was vigorous. The estimated ejection fraction was in the range of 65% to 70%. Wall motion was normal; there were no regional wall motion abnormalities. Doppler parameters are consistent with abnormal left ventricular relaxation (grade 1 diastolic dysfunction). - Aortic valve: A TAVR bioprosthesis was present and functioning normally. Peak velocity (S): 268 cm/s. Mean gradient (S): 16 mm Hg. Peak gradient (S): 29 mm Hg. Valve area (VTI): 1.71 cm^2. Valve area (Vmax): 1.51 cm^2. Valve area (Vmean): 1.59 cm^2. - Mitral valve: Severely calcified annulus. Mobility was restricted. The findings are consistent with mild stenosis. Mean gradient (D): 4 mm Hg. Valve area by pressure half-time: 1.88 cm^2. Valve area by continuity equation (using LVOT flow): 1.72 cm^2. - Left atrium: The atrium was mildly dilated.  Volume/bsa, S: 39.9 ml/m^2. - Pulmonary arteries: Systolic pressure was mildly increased. PA peak pressure: 31 mm Hg (S).   Labs/Other Tests and Data Reviewed:    EKG:  No ECG reviewed.  Recent Labs: 07/18/2017: Magnesium 1.7 11/01/2017: BNP 62.1 03/27/2018: ALT 26; BUN 6; Creatinine, Ser 0.89; Hemoglobin 9.5; Platelets 217; Potassium 3.6; Sodium 138   Recent Lipid Panel Lab Results  Component Value Date/Time   CHOL 126 11/01/2017 12:13 PM   TRIG 127 11/01/2017 12:13 PM   HDL 58 11/01/2017 12:13 PM   CHOLHDL 2.2 11/01/2017 12:13 PM   LDLCALC 43 11/01/2017 12:13 PM    Wt Readings  from Last 3 Encounters:  03/26/18 275 lb 12.7 oz (125.1 kg)  03/19/18 271 lb 12.8 oz (123.3 kg)  10/30/17 272 lb (123.4 kg)     Objective:    Vital Signs:  There were no vitals taken for this visit.   Well nourished, well developed female in no acute distress. Morbidly obesity. 2+ pitting edema    ASSESSMENT & PLAN:    Severe AS s/p TAVR: no longer on aspirin given recent perforated duodenal ulcer with admission in 03/2018. She has NYHA class II symptoms. She is mostly limited by her degenerative arthritis. KCCQ completed over the phone. She has amoxicillin for SBE prophylaxis. Echo has been pushed out until August given covid 19 pandemic.   CHB s/p PPM: followed by Dr. Curt Bears  HTN: no recent BP readings. Continue same regimen.  COVID-19 Education: the signs and symptoms of COVID-19 were discussed with the patient and how to seek care for testing (follow up with PCP or arrange E-visit).  The importance of social distancing was discussed today.  Time:   Today, I have spent 25 minutes with the patient with telehealth technology discussing the above problems.     Medication Adjustments/Labs and Tests Ordered: Current medicines are reviewed at length with the patient today.  Concerns regarding medicines are outlined above.   Tests Ordered: No orders of the defined types were placed in this encounter.   Medication Changes: Meds ordered this encounter  Medications   amoxicillin (AMOXIL) 500 MG tablet    Sig: Take 4 tablets (2,000 mg total) by mouth as directed. 1 hour prior to dental work including cleanings    Dispense:  12 tablet    Refill:  12    Order Specific Question:   Supervising Provider    Answer:   Burt Knack, [place]    Disposition:  Follow up august for echo  Mable Fill, PA-C  06/25/2018 3:40 PM    [place]

## 2018-07-10 ENCOUNTER — Other Ambulatory Visit: Payer: Self-pay | Admitting: Cardiology

## 2018-07-22 ENCOUNTER — Other Ambulatory Visit: Payer: Self-pay

## 2018-07-22 ENCOUNTER — Ambulatory Visit (INDEPENDENT_AMBULATORY_CARE_PROVIDER_SITE_OTHER): Payer: Medicare Other | Admitting: *Deleted

## 2018-07-22 DIAGNOSIS — I442 Atrioventricular block, complete: Secondary | ICD-10-CM | POA: Diagnosis not present

## 2018-07-23 LAB — CUP PACEART REMOTE DEVICE CHECK
Date Time Interrogation Session: 20200519075113
Implantable Lead Implant Date: 20180216
Implantable Lead Implant Date: 20180216
Implantable Lead Location: 753859
Implantable Lead Location: 753860
Implantable Pulse Generator Implant Date: 20180216
Pulse Gen Model: 2272
Pulse Gen Serial Number: 7998131

## 2018-07-31 ENCOUNTER — Encounter: Payer: Self-pay | Admitting: Cardiology

## 2018-07-31 NOTE — Progress Notes (Signed)
Remote pacemaker transmission.   

## 2018-08-28 ENCOUNTER — Encounter: Payer: Self-pay | Admitting: Thoracic Surgery (Cardiothoracic Vascular Surgery)

## 2018-09-15 IMAGING — CR DG CHEST 2V
2 series · 2 of 2 positions shown · non-contrast
Comparison: 04/22/2016

CLINICAL DATA: Preoperative exam for TAVR.  Aortic stenosis.

EXAM:
CHEST - 2 VIEW

[w chest lat]
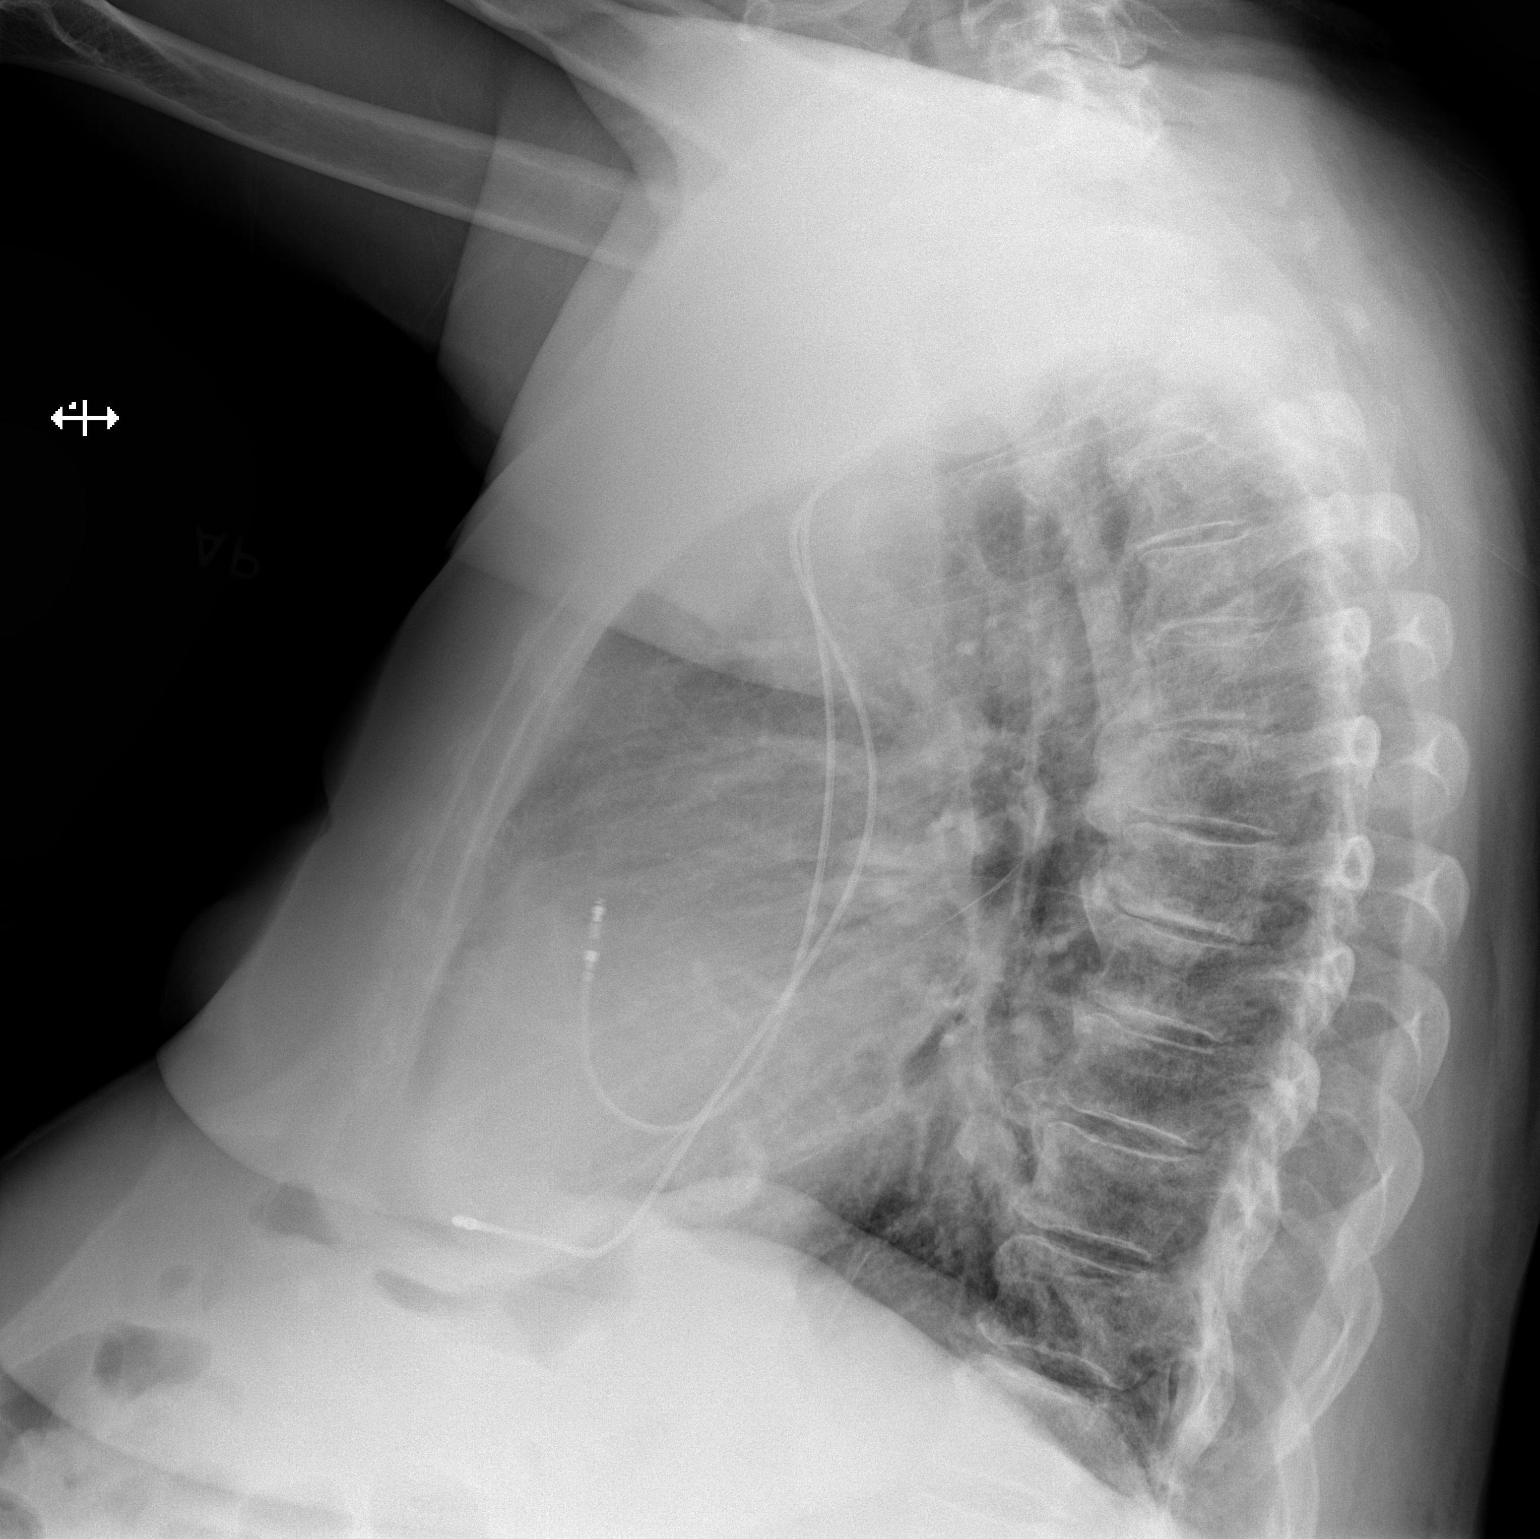

[x chest ap]
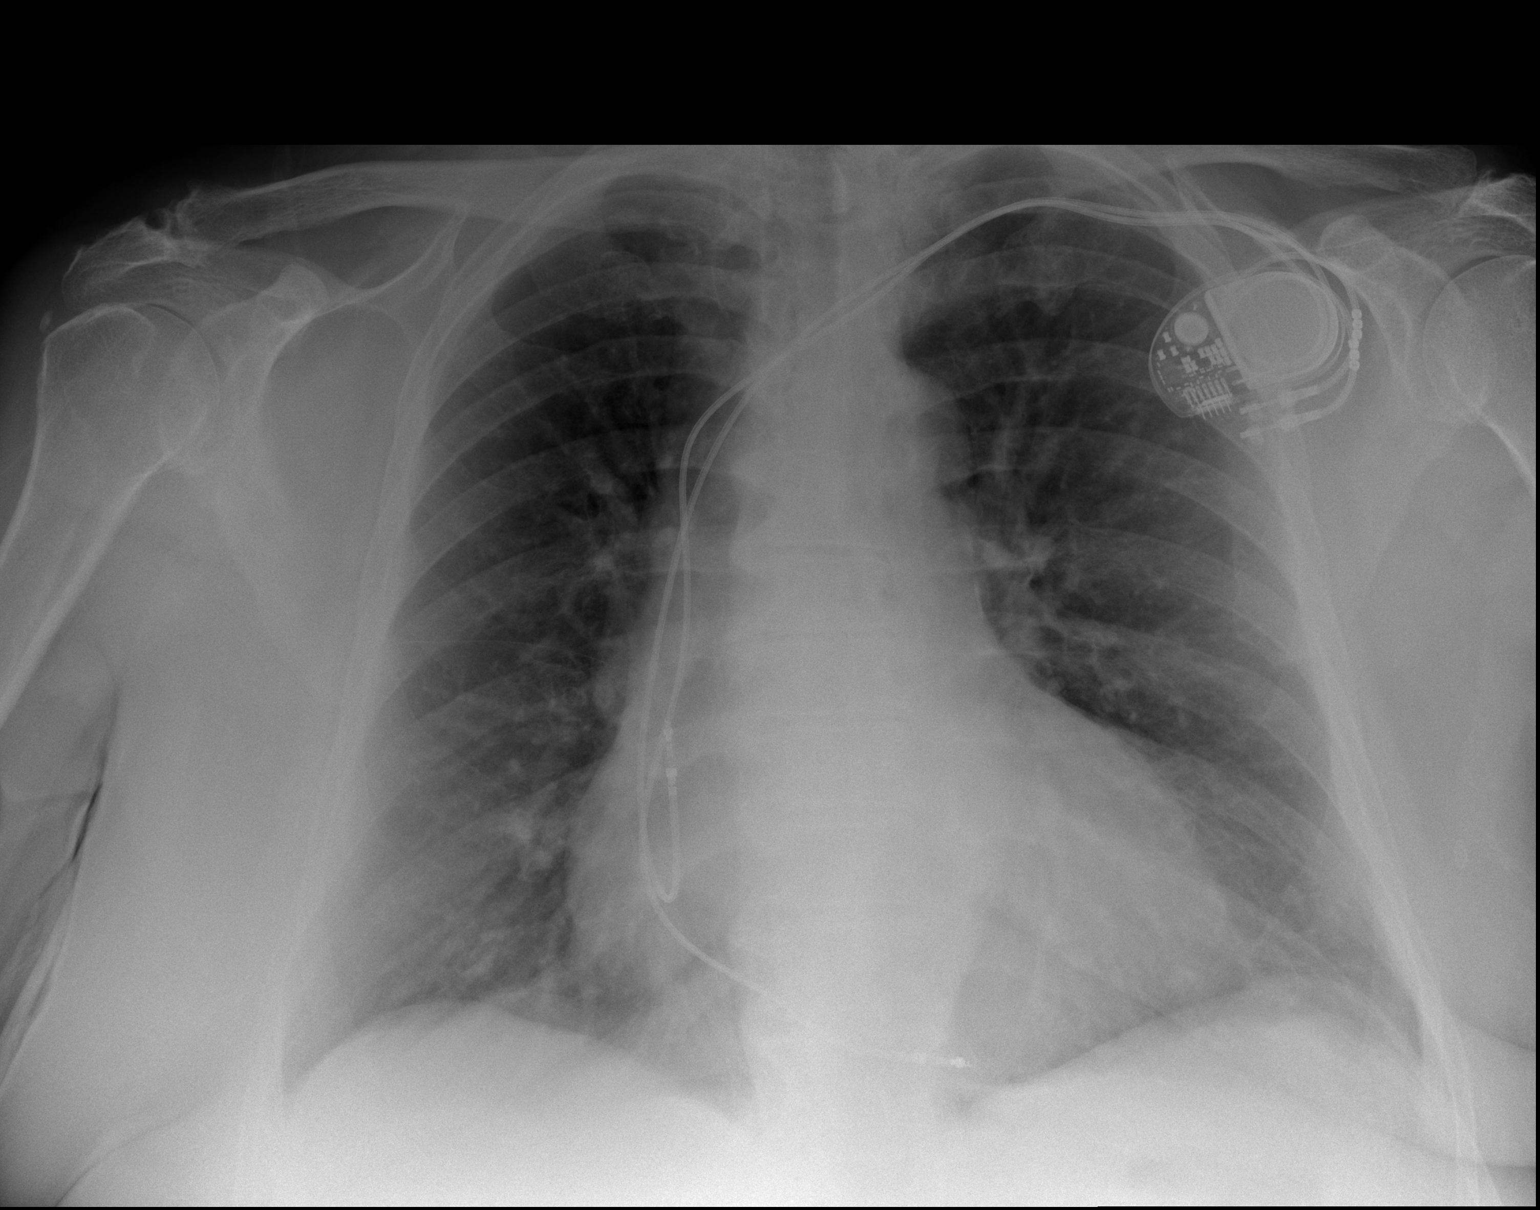

[2 of 2 positions shown; findings below may reference images not displayed]

FINDINGS: Cardiomegaly. Atherosclerosis and tortuosity of the aorta. Dual lead
pacemaker with leads in the region of the right atrium and right
ventricle. Pulmonary vascularity is normal. Lungs are clear. No
edema or effusions. Ordinary degenerative changes affect the spine
and shoulders.
IMPRESSION: No active disease. Cardiomegaly. Aortic atherosclerosis. Pacemaker.

## 2018-09-19 IMAGING — DX DG CHEST 1V PORT
1 series · 1 of 1 positions shown · non-contrast
Comparison: PA and lateral chest x-ray July 13, 2017

CLINICAL DATA: Status post transcatheter aortic valve replacement
today.

EXAM:
PORTABLE CHEST 1 VIEW

[chest ap]
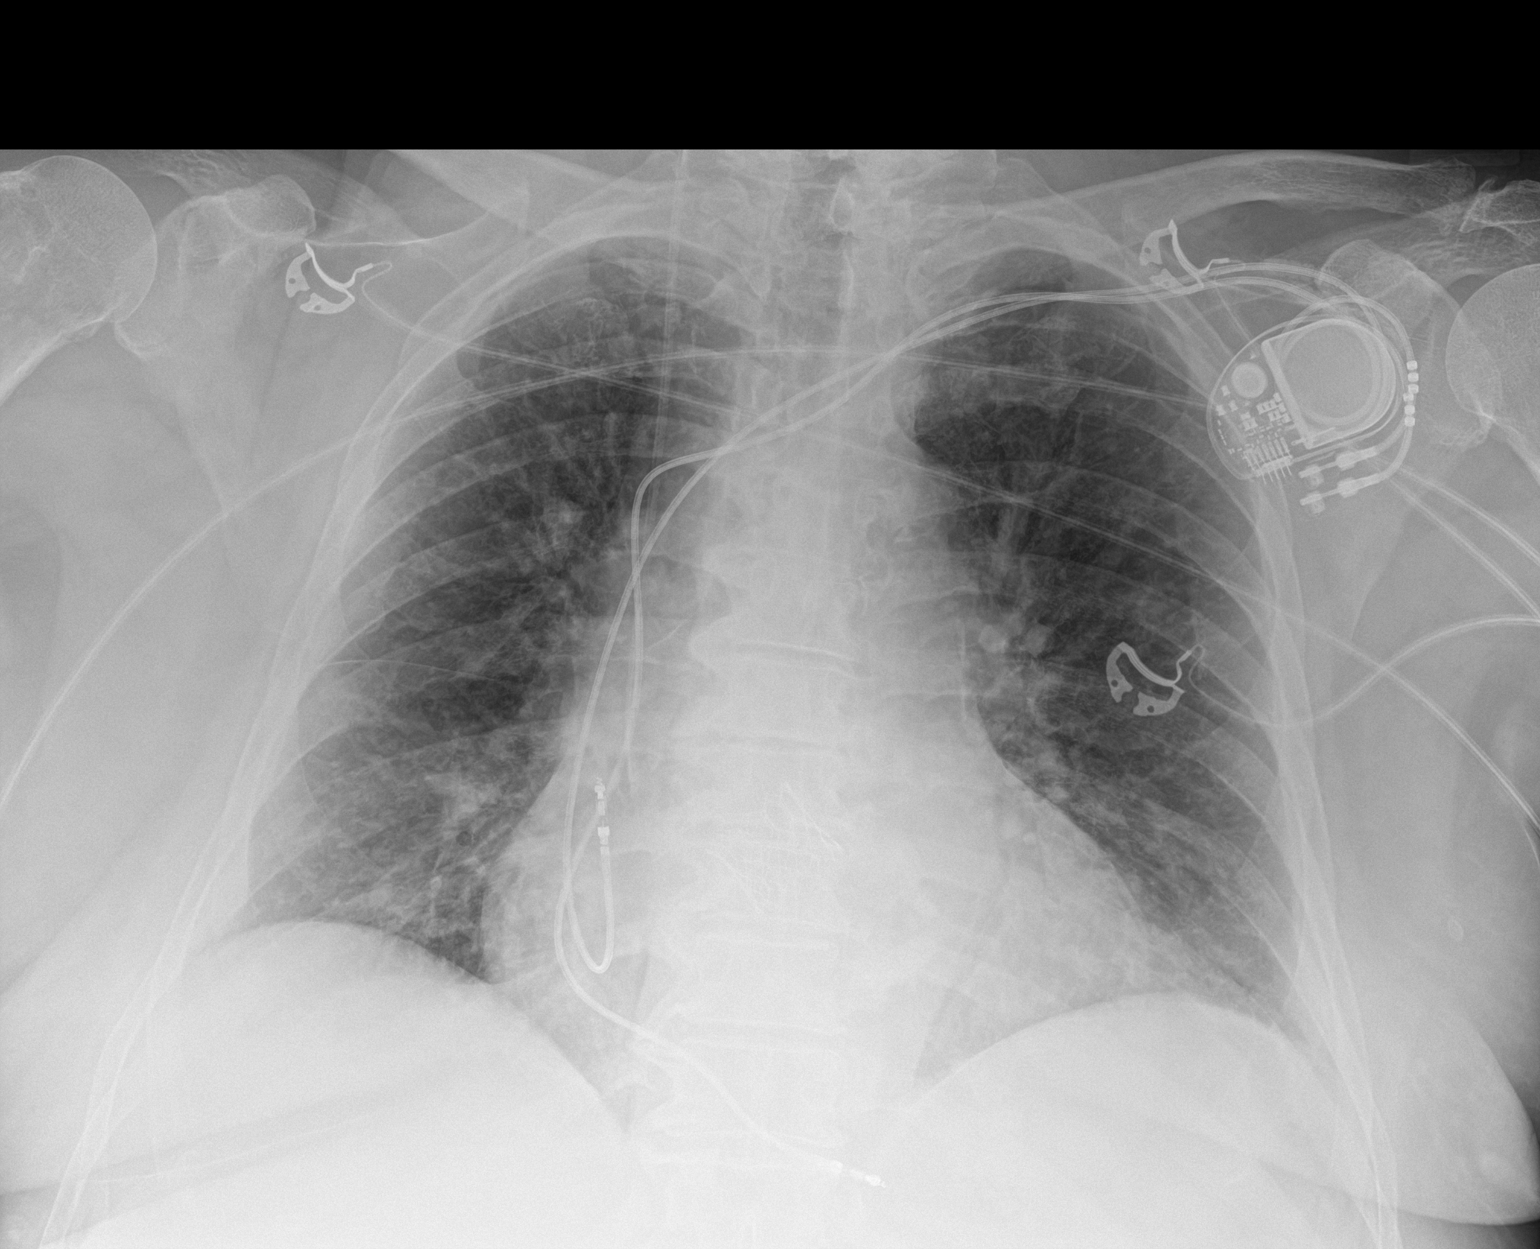

[1 of 1 positions shown; findings below may reference images not displayed]

FINDINGS: The lungs are well-expanded. The interstitial markings are mildly
prominent. The cardiac silhouette remains enlarged. The pulmonary
vascularity is mildly engorged. The ICD is in stable position. The
right internal jugular venous catheter terminates over the
midportion of the SVC. There is calcification in the wall of the
aortic arch and descending thoracic aorta.
IMPRESSION: No postprocedure complication following transcatheter aortic valve
replacement. Low-grade CHF not greatly changed from the previous
study.

Thoracic aortic atherosclerosis.

## 2018-10-09 ENCOUNTER — Other Ambulatory Visit (HOSPITAL_COMMUNITY): Payer: Medicare Other

## 2018-10-11 DIAGNOSIS — E559 Vitamin D deficiency, unspecified: Secondary | ICD-10-CM | POA: Diagnosis not present

## 2018-10-11 DIAGNOSIS — E7800 Pure hypercholesterolemia, unspecified: Secondary | ICD-10-CM | POA: Diagnosis not present

## 2018-10-11 DIAGNOSIS — F419 Anxiety disorder, unspecified: Secondary | ICD-10-CM | POA: Diagnosis not present

## 2018-10-11 DIAGNOSIS — N183 Chronic kidney disease, stage 3 (moderate): Secondary | ICD-10-CM | POA: Diagnosis not present

## 2018-10-11 DIAGNOSIS — Z794 Long term (current) use of insulin: Secondary | ICD-10-CM | POA: Diagnosis not present

## 2018-10-11 DIAGNOSIS — E1122 Type 2 diabetes mellitus with diabetic chronic kidney disease: Secondary | ICD-10-CM | POA: Diagnosis not present

## 2018-10-11 DIAGNOSIS — I10 Essential (primary) hypertension: Secondary | ICD-10-CM | POA: Diagnosis not present

## 2018-10-11 DIAGNOSIS — F339 Major depressive disorder, recurrent, unspecified: Secondary | ICD-10-CM | POA: Diagnosis not present

## 2018-10-21 ENCOUNTER — Ambulatory Visit (INDEPENDENT_AMBULATORY_CARE_PROVIDER_SITE_OTHER): Payer: Medicare Other | Admitting: *Deleted

## 2018-10-21 DIAGNOSIS — I442 Atrioventricular block, complete: Secondary | ICD-10-CM

## 2018-10-21 LAB — CUP PACEART REMOTE DEVICE CHECK
Battery Remaining Longevity: 122 mo
Battery Remaining Percentage: 95.5 %
Battery Voltage: 2.99 V
Brady Statistic AP VP Percent: 4.5 %
Brady Statistic AP VS Percent: 2.1 %
Brady Statistic AS VP Percent: 75 %
Brady Statistic AS VS Percent: 18 %
Brady Statistic RA Percent Paced: 5.5 %
Brady Statistic RV Percent Paced: 80 %
Date Time Interrogation Session: 20200817060017
Implantable Lead Implant Date: 20180216
Implantable Lead Implant Date: 20180216
Implantable Lead Location: 753859
Implantable Lead Location: 753860
Implantable Pulse Generator Implant Date: 20180216
Lead Channel Impedance Value: 430 Ohm
Lead Channel Impedance Value: 430 Ohm
Lead Channel Pacing Threshold Amplitude: 0.75 V
Lead Channel Pacing Threshold Amplitude: 0.75 V
Lead Channel Pacing Threshold Pulse Width: 0.5 ms
Lead Channel Pacing Threshold Pulse Width: 0.5 ms
Lead Channel Sensing Intrinsic Amplitude: 12 mV
Lead Channel Sensing Intrinsic Amplitude: 3.7 mV
Lead Channel Setting Pacing Amplitude: 1 V
Lead Channel Setting Pacing Amplitude: 2 V
Lead Channel Setting Pacing Pulse Width: 0.5 ms
Lead Channel Setting Sensing Sensitivity: 2.5 mV
Pulse Gen Model: 2272
Pulse Gen Serial Number: 7998131

## 2018-10-30 ENCOUNTER — Encounter: Payer: Self-pay | Admitting: Cardiology

## 2018-10-30 NOTE — Progress Notes (Signed)
Remote pacemaker transmission.   

## 2018-11-04 DIAGNOSIS — I10 Essential (primary) hypertension: Secondary | ICD-10-CM | POA: Diagnosis not present

## 2018-11-04 DIAGNOSIS — Z853 Personal history of malignant neoplasm of breast: Secondary | ICD-10-CM | POA: Diagnosis not present

## 2018-11-04 DIAGNOSIS — F339 Major depressive disorder, recurrent, unspecified: Secondary | ICD-10-CM | POA: Diagnosis not present

## 2018-11-04 DIAGNOSIS — N183 Chronic kidney disease, stage 3 (moderate): Secondary | ICD-10-CM | POA: Diagnosis not present

## 2018-11-04 DIAGNOSIS — E1122 Type 2 diabetes mellitus with diabetic chronic kidney disease: Secondary | ICD-10-CM | POA: Diagnosis not present

## 2018-11-04 DIAGNOSIS — E781 Pure hyperglyceridemia: Secondary | ICD-10-CM | POA: Diagnosis not present

## 2018-11-04 DIAGNOSIS — E1169 Type 2 diabetes mellitus with other specified complication: Secondary | ICD-10-CM | POA: Diagnosis not present

## 2018-11-15 DIAGNOSIS — Z23 Encounter for immunization: Secondary | ICD-10-CM | POA: Diagnosis not present

## 2018-11-15 DIAGNOSIS — N183 Chronic kidney disease, stage 3 (moderate): Secondary | ICD-10-CM | POA: Diagnosis not present

## 2018-11-15 DIAGNOSIS — E7800 Pure hypercholesterolemia, unspecified: Secondary | ICD-10-CM | POA: Diagnosis not present

## 2018-11-15 DIAGNOSIS — R609 Edema, unspecified: Secondary | ICD-10-CM | POA: Diagnosis not present

## 2018-11-15 DIAGNOSIS — E559 Vitamin D deficiency, unspecified: Secondary | ICD-10-CM | POA: Diagnosis not present

## 2018-11-15 DIAGNOSIS — E1122 Type 2 diabetes mellitus with diabetic chronic kidney disease: Secondary | ICD-10-CM | POA: Diagnosis not present

## 2018-12-06 DIAGNOSIS — Z6841 Body Mass Index (BMI) 40.0 and over, adult: Secondary | ICD-10-CM | POA: Diagnosis not present

## 2018-12-06 DIAGNOSIS — M1711 Unilateral primary osteoarthritis, right knee: Secondary | ICD-10-CM | POA: Diagnosis not present

## 2018-12-06 DIAGNOSIS — M1712 Unilateral primary osteoarthritis, left knee: Secondary | ICD-10-CM | POA: Diagnosis not present

## 2018-12-06 DIAGNOSIS — R809 Proteinuria, unspecified: Secondary | ICD-10-CM | POA: Diagnosis not present

## 2018-12-06 DIAGNOSIS — M1611 Unilateral primary osteoarthritis, right hip: Secondary | ICD-10-CM | POA: Diagnosis not present

## 2018-12-06 DIAGNOSIS — E1121 Type 2 diabetes mellitus with diabetic nephropathy: Secondary | ICD-10-CM | POA: Diagnosis not present

## 2018-12-06 DIAGNOSIS — Z170 Estrogen receptor positive status [ER+]: Secondary | ICD-10-CM | POA: Diagnosis not present

## 2018-12-06 DIAGNOSIS — Z79899 Other long term (current) drug therapy: Secondary | ICD-10-CM | POA: Diagnosis not present

## 2018-12-06 DIAGNOSIS — Z86718 Personal history of other venous thrombosis and embolism: Secondary | ICD-10-CM | POA: Diagnosis not present

## 2018-12-06 DIAGNOSIS — N1831 Chronic kidney disease, stage 3a: Secondary | ICD-10-CM | POA: Diagnosis not present

## 2018-12-06 DIAGNOSIS — E1169 Type 2 diabetes mellitus with other specified complication: Secondary | ICD-10-CM | POA: Diagnosis not present

## 2018-12-06 DIAGNOSIS — C50212 Malignant neoplasm of upper-inner quadrant of left female breast: Secondary | ICD-10-CM | POA: Diagnosis not present

## 2018-12-06 DIAGNOSIS — F419 Anxiety disorder, unspecified: Secondary | ICD-10-CM | POA: Diagnosis not present

## 2018-12-06 DIAGNOSIS — M4807 Spinal stenosis, lumbosacral region: Secondary | ICD-10-CM | POA: Diagnosis not present

## 2018-12-06 DIAGNOSIS — E559 Vitamin D deficiency, unspecified: Secondary | ICD-10-CM | POA: Diagnosis not present

## 2018-12-06 DIAGNOSIS — Z08 Encounter for follow-up examination after completed treatment for malignant neoplasm: Secondary | ICD-10-CM | POA: Diagnosis not present

## 2018-12-06 DIAGNOSIS — E1122 Type 2 diabetes mellitus with diabetic chronic kidney disease: Secondary | ICD-10-CM | POA: Diagnosis not present

## 2018-12-06 DIAGNOSIS — Z86010 Personal history of colonic polyps: Secondary | ICD-10-CM | POA: Diagnosis not present

## 2018-12-06 DIAGNOSIS — N183 Chronic kidney disease, stage 3 unspecified: Secondary | ICD-10-CM | POA: Diagnosis not present

## 2018-12-06 DIAGNOSIS — C562 Malignant neoplasm of left ovary: Secondary | ICD-10-CM | POA: Diagnosis not present

## 2018-12-06 DIAGNOSIS — E7800 Pure hypercholesterolemia, unspecified: Secondary | ICD-10-CM | POA: Diagnosis not present

## 2018-12-06 DIAGNOSIS — M1612 Unilateral primary osteoarthritis, left hip: Secondary | ICD-10-CM | POA: Diagnosis not present

## 2018-12-06 DIAGNOSIS — Z794 Long term (current) use of insulin: Secondary | ICD-10-CM | POA: Diagnosis not present

## 2018-12-06 DIAGNOSIS — Z9189 Other specified personal risk factors, not elsewhere classified: Secondary | ICD-10-CM | POA: Diagnosis not present

## 2018-12-06 DIAGNOSIS — Z853 Personal history of malignant neoplasm of breast: Secondary | ICD-10-CM | POA: Diagnosis not present

## 2018-12-09 ENCOUNTER — Other Ambulatory Visit: Payer: Self-pay | Admitting: Orthopaedic Surgery

## 2018-12-09 DIAGNOSIS — M1611 Unilateral primary osteoarthritis, right hip: Secondary | ICD-10-CM

## 2018-12-11 ENCOUNTER — Other Ambulatory Visit: Payer: Medicare Other

## 2018-12-13 ENCOUNTER — Ambulatory Visit
Admission: RE | Admit: 2018-12-13 | Discharge: 2018-12-13 | Disposition: A | Discharge status: Home / Self Care | Payer: Medicare Other | Source: Ambulatory Visit | Attending: Orthopaedic Surgery | Admitting: Orthopaedic Surgery

## 2018-12-13 DIAGNOSIS — M1611 Unilateral primary osteoarthritis, right hip: Secondary | ICD-10-CM

## 2018-12-13 MED ORDER — IOPAMIDOL (ISOVUE-M 200) INJECTION 41%
1.0000 mL | Freq: Once | INTRAMUSCULAR | Status: AC
  Administered 2018-12-13: 1 mL via INTRA_ARTICULAR

## 2018-12-13 MED ORDER — METHYLPREDNISOLONE ACETATE 40 MG/ML INJ SUSP (RADIOLOG
120.0000 mg | Freq: Once | INTRAMUSCULAR | Status: AC
  Administered 2018-12-13: 120 mg via INTRA_ARTICULAR

## 2018-12-23 ENCOUNTER — Other Ambulatory Visit (HOSPITAL_COMMUNITY): Payer: Medicare Other

## 2019-01-06 ENCOUNTER — Other Ambulatory Visit: Payer: Self-pay | Admitting: Cardiovascular Disease

## 2019-01-20 ENCOUNTER — Ambulatory Visit (INDEPENDENT_AMBULATORY_CARE_PROVIDER_SITE_OTHER): Payer: Medicare Other | Admitting: *Deleted

## 2019-01-20 DIAGNOSIS — I442 Atrioventricular block, complete: Secondary | ICD-10-CM | POA: Diagnosis not present

## 2019-01-21 LAB — CUP PACEART REMOTE DEVICE CHECK
Battery Remaining Longevity: 122 mo
Battery Remaining Percentage: 95.5 %
Battery Voltage: 3.01 V
Brady Statistic AP VP Percent: 4.4 %
Brady Statistic AP VS Percent: 1.8 %
Brady Statistic AS VP Percent: 78 %
Brady Statistic AS VS Percent: 15 %
Brady Statistic RA Percent Paced: 5 %
Brady Statistic RV Percent Paced: 82 %
Date Time Interrogation Session: 20201116070018
Implantable Lead Implant Date: 20180216
Implantable Lead Implant Date: 20180216
Implantable Lead Location: 753859
Implantable Lead Location: 753860
Implantable Pulse Generator Implant Date: 20180216
Lead Channel Impedance Value: 430 Ohm
Lead Channel Impedance Value: 440 Ohm
Lead Channel Pacing Threshold Amplitude: 0.75 V
Lead Channel Pacing Threshold Amplitude: 0.875 V
Lead Channel Pacing Threshold Pulse Width: 0.5 ms
Lead Channel Pacing Threshold Pulse Width: 0.5 ms
Lead Channel Sensing Intrinsic Amplitude: 12 mV
Lead Channel Sensing Intrinsic Amplitude: 3.8 mV
Lead Channel Setting Pacing Amplitude: 1.125
Lead Channel Setting Pacing Amplitude: 2 V
Lead Channel Setting Pacing Pulse Width: 0.5 ms
Lead Channel Setting Sensing Sensitivity: 2.5 mV
Pulse Gen Model: 2272
Pulse Gen Serial Number: 7998131

## 2019-02-14 DIAGNOSIS — Z170 Estrogen receptor positive status [ER+]: Secondary | ICD-10-CM | POA: Diagnosis not present

## 2019-02-14 DIAGNOSIS — Z9189 Other specified personal risk factors, not elsewhere classified: Secondary | ICD-10-CM | POA: Diagnosis not present

## 2019-02-14 DIAGNOSIS — Z1231 Encounter for screening mammogram for malignant neoplasm of breast: Secondary | ICD-10-CM | POA: Diagnosis not present

## 2019-02-14 DIAGNOSIS — C50212 Malignant neoplasm of upper-inner quadrant of left female breast: Secondary | ICD-10-CM | POA: Diagnosis not present

## 2019-02-14 NOTE — Progress Notes (Signed)
Remote pacemaker transmission.   

## 2019-04-01 ENCOUNTER — Ambulatory Visit: Payer: Medicare Other

## 2019-04-01 ENCOUNTER — Telehealth: Payer: Self-pay | Admitting: *Deleted

## 2019-04-01 DIAGNOSIS — F339 Major depressive disorder, recurrent, unspecified: Secondary | ICD-10-CM | POA: Diagnosis not present

## 2019-04-01 DIAGNOSIS — Z853 Personal history of malignant neoplasm of breast: Secondary | ICD-10-CM | POA: Diagnosis not present

## 2019-04-01 DIAGNOSIS — E1122 Type 2 diabetes mellitus with diabetic chronic kidney disease: Secondary | ICD-10-CM | POA: Diagnosis not present

## 2019-04-01 DIAGNOSIS — I10 Essential (primary) hypertension: Secondary | ICD-10-CM | POA: Diagnosis not present

## 2019-04-01 DIAGNOSIS — E1169 Type 2 diabetes mellitus with other specified complication: Secondary | ICD-10-CM | POA: Diagnosis not present

## 2019-04-01 DIAGNOSIS — N183 Chronic kidney disease, stage 3 unspecified: Secondary | ICD-10-CM | POA: Diagnosis not present

## 2019-04-01 DIAGNOSIS — E7800 Pure hypercholesterolemia, unspecified: Secondary | ICD-10-CM | POA: Diagnosis not present

## 2019-04-01 DIAGNOSIS — E781 Pure hyperglyceridemia: Secondary | ICD-10-CM | POA: Diagnosis not present

## 2019-04-01 NOTE — Telephone Encounter (Signed)
A message was left, re: her follow up visit. 

## 2019-04-10 ENCOUNTER — Ambulatory Visit: Payer: Medicare Other | Attending: Internal Medicine

## 2019-04-10 ENCOUNTER — Ambulatory Visit: Payer: Medicare Other

## 2019-04-10 DIAGNOSIS — Z23 Encounter for immunization: Secondary | ICD-10-CM | POA: Insufficient documentation

## 2019-04-10 NOTE — Progress Notes (Signed)
   Covid-19 Vaccination Clinic  Name:  [name]    MRN: [id number] DOB: 1935/12/30  04/10/2019  [name] was observed post Covid-19 immunization for 15 minutes without incidence. She was provided with Vaccine Information Sheet and instruction to access the V-Safe system.   [name] was instructed to call 911 with any severe reactions post vaccine: [name] Kitchen Difficulty breathing  . Swelling of your face and throat  . A fast heartbeat  . A bad rash all over your body  . Dizziness and weakness    Immunizations Administered    Name Date Dose VIS Date Route   Pfizer COVID-19 Vaccine 04/10/2019 12:15 PM 0.3 mL 02/14/2019 Intramuscular   Manufacturer: [place]   Lot: [id number]   [place]: [id number]

## 2019-04-21 ENCOUNTER — Ambulatory Visit (INDEPENDENT_AMBULATORY_CARE_PROVIDER_SITE_OTHER): Payer: Medicare Other | Admitting: *Deleted

## 2019-04-21 DIAGNOSIS — I442 Atrioventricular block, complete: Secondary | ICD-10-CM | POA: Diagnosis not present

## 2019-04-21 LAB — CUP PACEART REMOTE DEVICE CHECK
Battery Remaining Longevity: 122 mo
Battery Remaining Percentage: 95.5 %
Battery Voltage: 2.99 V
Brady Statistic AP VP Percent: 4.2 %
Brady Statistic AP VS Percent: 1.5 %
Brady Statistic AS VP Percent: 80 %
Brady Statistic AS VS Percent: 13 %
Brady Statistic RA Percent Paced: 4.6 %
Brady Statistic RV Percent Paced: 84 %
Date Time Interrogation Session: 20210215082828
Implantable Lead Implant Date: 20180216
Implantable Lead Implant Date: 20180216
Implantable Lead Location: 753859
Implantable Lead Location: 753860
Implantable Pulse Generator Implant Date: 20180216
Lead Channel Impedance Value: 430 Ohm
Lead Channel Impedance Value: 440 Ohm
Lead Channel Pacing Threshold Amplitude: 0.75 V
Lead Channel Pacing Threshold Amplitude: 0.875 V
Lead Channel Pacing Threshold Pulse Width: 0.5 ms
Lead Channel Pacing Threshold Pulse Width: 0.5 ms
Lead Channel Sensing Intrinsic Amplitude: 12 mV
Lead Channel Sensing Intrinsic Amplitude: 3.9 mV
Lead Channel Setting Pacing Amplitude: 1.125
Lead Channel Setting Pacing Amplitude: 2 V
Lead Channel Setting Pacing Pulse Width: 0.5 ms
Lead Channel Setting Sensing Sensitivity: 2.5 mV
Pulse Gen Model: 2272
Pulse Gen Serial Number: 7998131

## 2019-04-22 NOTE — Progress Notes (Signed)
PPM Remote  

## 2019-05-02 DIAGNOSIS — E1122 Type 2 diabetes mellitus with diabetic chronic kidney disease: Secondary | ICD-10-CM | POA: Diagnosis not present

## 2019-05-02 DIAGNOSIS — Z853 Personal history of malignant neoplasm of breast: Secondary | ICD-10-CM | POA: Diagnosis not present

## 2019-05-02 DIAGNOSIS — N183 Chronic kidney disease, stage 3 unspecified: Secondary | ICD-10-CM | POA: Diagnosis not present

## 2019-05-02 DIAGNOSIS — F339 Major depressive disorder, recurrent, unspecified: Secondary | ICD-10-CM | POA: Diagnosis not present

## 2019-05-02 DIAGNOSIS — E781 Pure hyperglyceridemia: Secondary | ICD-10-CM | POA: Diagnosis not present

## 2019-05-02 DIAGNOSIS — E7800 Pure hypercholesterolemia, unspecified: Secondary | ICD-10-CM | POA: Diagnosis not present

## 2019-05-02 DIAGNOSIS — E1169 Type 2 diabetes mellitus with other specified complication: Secondary | ICD-10-CM | POA: Diagnosis not present

## 2019-05-02 DIAGNOSIS — I10 Essential (primary) hypertension: Secondary | ICD-10-CM | POA: Diagnosis not present

## 2019-05-05 ENCOUNTER — Ambulatory Visit: Payer: Medicare Other | Attending: Internal Medicine

## 2019-05-05 DIAGNOSIS — Z23 Encounter for immunization: Secondary | ICD-10-CM

## 2019-05-05 NOTE — Progress Notes (Signed)
   Covid-19 Vaccination Clinic  Name:  [name]    MRN: [id number] DOB: [birth year]  05/05/2019  [name] was observed post Covid-19 immunization for 30 minutes based on pre-vaccination screening without incidence. She was provided with Vaccine Information Sheet and instruction to access the V-Safe system.   [name] was instructed to call 911 with any severe reactions post vaccine: [name] Kitchen Difficulty breathing  . Swelling of your face and throat  . A fast heartbeat  . A bad rash all over your body  . Dizziness and weakness    Immunizations Administered    Name Date Dose VIS Date Route   Pfizer COVID-19 Vaccine 05/05/2019  3:20 PM 0.3 mL 02/14/2019 Intramuscular   Manufacturer: [place]   Lot: [id number]   [place]: [id number]

## 2019-05-30 ENCOUNTER — Telehealth: Payer: Self-pay | Admitting: Physician Assistant

## 2019-05-30 NOTE — Telephone Encounter (Signed)
[name] returned my call and she cannot bring the pt to an apt on 4/23. After speaking with [name] further the pt will be seeing Dr Curt Bears on 4/20 after her Echo.  I recommended that she should probably make a follow-up appointment in 6 months with Dr Oval Linsey so that she can see an MD every few months in person. [name] agreed with plan.

## 2019-05-30 NOTE — Telephone Encounter (Signed)
I left a detailed voicemail for [name] that the she can disregard the recall letter for Emily Range PA-C follow-up. The pt did a telemedicine visit in April of 2020 with [name].  The pt has not followed up with her primary cardiologist Dr Oval Linsey and I have booked an apt for the pt to see her on 4/23 at 11:20 AM to follow-up in the office after having an echocardiogram on 4/20.

## 2019-05-30 NOTE — Telephone Encounter (Signed)
Howie Ill, niece of the patient called to set up a TAVR follow-up per the recall , but all of [name]'s appointments are blocked. Please contact the niece to set up the appointment

## 2019-06-02 DIAGNOSIS — M1711 Unilateral primary osteoarthritis, right knee: Secondary | ICD-10-CM | POA: Diagnosis not present

## 2019-06-02 DIAGNOSIS — M1712 Unilateral primary osteoarthritis, left knee: Secondary | ICD-10-CM | POA: Diagnosis not present

## 2019-06-24 ENCOUNTER — Ambulatory Visit (HOSPITAL_COMMUNITY): Payer: Medicare Other | Attending: Cardiovascular Disease

## 2019-06-24 ENCOUNTER — Encounter: Payer: Self-pay | Admitting: Cardiology

## 2019-06-24 ENCOUNTER — Other Ambulatory Visit: Payer: Self-pay

## 2019-06-24 ENCOUNTER — Ambulatory Visit (INDEPENDENT_AMBULATORY_CARE_PROVIDER_SITE_OTHER): Payer: Medicare Other | Admitting: Cardiology

## 2019-06-24 VITALS — BP 124/68 | HR 70 | Ht 67.0 in | Wt 280.0 lb

## 2019-06-24 DIAGNOSIS — Z952 Presence of prosthetic heart valve: Secondary | ICD-10-CM

## 2019-06-24 DIAGNOSIS — E7800 Pure hypercholesterolemia, unspecified: Secondary | ICD-10-CM | POA: Diagnosis not present

## 2019-06-24 DIAGNOSIS — I442 Atrioventricular block, complete: Secondary | ICD-10-CM | POA: Diagnosis not present

## 2019-06-24 DIAGNOSIS — R8290 Unspecified abnormal findings in urine: Secondary | ICD-10-CM | POA: Diagnosis not present

## 2019-06-24 DIAGNOSIS — E612 Magnesium deficiency: Secondary | ICD-10-CM | POA: Diagnosis not present

## 2019-06-24 DIAGNOSIS — E1169 Type 2 diabetes mellitus with other specified complication: Secondary | ICD-10-CM | POA: Diagnosis not present

## 2019-06-24 DIAGNOSIS — I10 Essential (primary) hypertension: Secondary | ICD-10-CM | POA: Diagnosis not present

## 2019-06-24 DIAGNOSIS — F339 Major depressive disorder, recurrent, unspecified: Secondary | ICD-10-CM | POA: Diagnosis not present

## 2019-06-24 DIAGNOSIS — E559 Vitamin D deficiency, unspecified: Secondary | ICD-10-CM | POA: Diagnosis not present

## 2019-06-24 DIAGNOSIS — E1122 Type 2 diabetes mellitus with diabetic chronic kidney disease: Secondary | ICD-10-CM | POA: Diagnosis not present

## 2019-06-24 DIAGNOSIS — R809 Proteinuria, unspecified: Secondary | ICD-10-CM | POA: Diagnosis not present

## 2019-06-24 DIAGNOSIS — N183 Chronic kidney disease, stage 3 unspecified: Secondary | ICD-10-CM | POA: Diagnosis not present

## 2019-06-24 NOTE — Patient Instructions (Signed)
Medication Instructions:  Your physician has recommended you make the following change in your medication:  1. INCREASE Lasix to 40 TWICE a day for 5 days, then return normal dosing.  *If you need a refill on your cardiac medications before your next appointment, please call your pharmacy*   Lab Work: None ordered If you have labs (blood work) drawn today and your tests are completely normal, you will receive your results only by: [name] Kitchen MyChart Message (if you have MyChart) OR . A paper copy in the mail If you have any lab test that is abnormal or we need to change your treatment, we will call you to review the results.   Testing/Procedures: None ordered   Follow-Up: Your physician recommends that you schedule a follow-up appointment in: 1 week with APP on [facility] care team.  Remote monitoring is used to monitor your Pacemaker of ICD from home. This monitoring reduces the number of office visits required to check your device to one time per year. It allows [place] to keep an eye on the functioning of your device to ensure it is working properly. You are scheduled for a device check from home on 07/21/2019. You may send your transmission at any time that day. If you have a wireless device, the transmission will be sent automatically. After your physician reviews your transmission, you will receive a postcard with your next transmission date.   At [facility], you and your health needs are our priority.  As part of our continuing mission to provide you with exceptional heart care, we have created designated Provider Care Teams.  These Care Teams include your primary Cardiologist (physician) and Advanced Practice Providers (APPs -  Physician Assistants and Nurse Practitioners) who all work together to provide you with the care you need, when you need it.  We recommend signing up for the patient portal called "MyChart".  Sign up information is provided on this After Visit Summary.  MyChart  is used to connect with patients for Virtual Visits (Telemedicine).  Patients are able to view lab/test results, encounter notes, upcoming appointments, etc.  Non-urgent messages can be sent to your provider as well.   To learn more about what you can do with MyChart, go to [email or web address].    Your next appointment:   1 year(s)  The format for your next appointment:   In Person  Provider:   Allegra Lai, MD   Thank you for choosing [facility]!!   Trinidad Curet, RN [phone]    Other Instructions

## 2019-06-24 NOTE — Progress Notes (Signed)
Electrophysiology Office Note   Date:  06/24/2019   ID:  [name], DOB 07/10/1935, MRN [id number]  PCP:  Leighton Ruff, MD  Primary Electrophysiologist:  Edwen Mclester Meredith Leeds, MD    No chief complaint on file.    History of Present Illness: [name] is a 84 y.o. female who is being seen today for the evaluation of heart block at the request of Leighton Ruff, MD. Presenting today for electrophysiology evaluation. She has history of diabetes, hypertension, ovarian cancer, breast cancer status post left lumpectomy with port on the right, obesity, arthritis, and spinal stenosis. She presented to [facility] on 04/20/16 with worsening shortness of breath lower extremity edema and exercise intolerance. She was found to be in complete heart block. She was admitted to the hospital and home metoprolol was held. Heart block continued and she had a St. Jude dual-chamber pacemaker placed on 04/21/16.   Today, denies symptoms of palpitations, chest pain, shortness of breath, orthopnea, PND,  claudication, dizziness, presyncope, syncope, bleeding, or neurologic sequela. The patient is tolerating medications without difficulties.  Her main complaint today is lower extremity edema.  She is able to do most of her daily activities, though she is limited with edema and arthritis in her legs.  She says that her edema is up to her knees and her legs feel quite tight all the time.  She has been trying to sleep in a recliner and elevate her legs.  Past Medical History:  Diagnosis Date  . Anxiety   . Aortic stenosis   . Arthritis   . Breast cancer [facility]    CANCER  . Diabetes mellitus    Type II  . Dyspnea   . Heart murmur   . History of chemotherapy   . History of kidney stones   . Hypertension   . Mitral stenosis   . Morbid obesity (HCC)    BMI > 40  . Ovarian cancer ([place])   . Presence of permanent cardiac pacemaker    St. Jude  . Pulmonary embolism ([place]) 2013   post op  . 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 Spinal stenosis    Past Surgical History:  Procedure Laterality Date  . ABDOMINAL HYSTERECTOMY  2013  . BREAST SURGERY  03-2004   lumpectomy   . CARDIAC CATHETERIZATION    . INSERT / REPLACE / REMOVE PACEMAKER    . INTRAOPERATIVE TRANSTHORACIC ECHOCARDIOGRAM  07/17/2017   Procedure: INTRAOPERATIVE TRANSTHORACIC ECHOCARDIOGRAM;  Surgeon: Sherren Mocha, MD;  Location: [facility];  Service: Open Heart Surgery;;  . LAPAROTOMY N/A 03/22/2018   Procedure: grahams pouch, repair of bowel perforation;  Surgeon: Kinsinger, Arta Bruce, MD;  Location: [facility];  Service: General;  Laterality: N/A;  . LITHOTRIPSY    . PACEMAKER IMPLANT N/A 04/21/2016   Procedure: Pacemaker Implant;  Surgeon: Aviv Rota Meredith Leeds, MD;  Location: [facility] CV LAB;  Service: Cardiovascular;  Laterality: N/A;  . TONSILLECTOMY    . TRANSCATHETER AORTIC VALVE REPLACEMENT, TRANSFEMORAL N/A 07/17/2017   Procedure: TRANSCATHETER AORTIC VALVE REPLACEMENT, TRANSFEMORAL;  Surgeon: Sherren Mocha, MD;  Location: [facility] [place];  Service: Open Heart Surgery;  Laterality: N/A;     Current Outpatient Medications  Medication Sig Dispense Refill  . acetaminophen (TYLENOL) 650 MG CR tablet Take 1,300 mg by mouth every 8 (eight) hours as needed for pain.    [name] Kitchen amoxicillin (AMOXIL) 500  MG tablet Take 4 tablets (2,000 mg total) by mouth as directed. 1 hour prior to dental work including cleanings 12 tablet 12  . atorvastatin (LIPITOR) 20 MG tablet Take 20 mg by mouth at bedtime.     . Calcium Citrate-Vitamin D (CALCIUM CITRATE + PO) Take 1 tablet by mouth 2 (two) times daily.     . cholecalciferol (VITAMIN D) 1000 units tablet Take 1,000 Units by mouth 2 (two) times daily.     . fish oil-omega-3 fatty acids 1000 MG capsule Take 1 g by mouth every evening.     [name] Kitchen FLUoxetine (PROZAC) 40 MG capsule Take  40 mg by mouth daily.     . furosemide (LASIX) 40 MG tablet TAKE 1 TABLET BY MOUTH TWICE DAILY 180 tablet 2  . LEVEMIR FLEXTOUCH 100 UNIT/ML Pen Inject 60-70 Units into the skin 2 (two) times daily. Per sliding scale    . losartan (COZAAR) 50 MG tablet Take 50 mg by mouth daily.    . Magnesium 400 MG CAPS Take 400 mg by mouth at bedtime.     . metFORMIN (GLUCOPHAGE) 1000 MG tablet Take 1,000 mg by mouth 2 (two) times daily with a meal.     . metoprolol tartrate (LOPRESSOR) 25 MG tablet TAKE 1 TABLET(25 MG) BY MOUTH TWICE DAILY 180 tablet 3  . Multiple Vitamin (MULTIVITAMIN) tablet Take 1 tablet by mouth daily.      . pantoprazole (PROTONIX) 40 MG tablet Take 1 tablet (40 mg total) by mouth 2 (two) times daily. 60 tablet 0  . saccharomyces boulardii (FLORASTOR) 250 MG capsule Take 1 capsule (250 mg total) by mouth 2 (two) times daily. You can find a probiotic over the counter. Take this while you are on antibiotics.     No current facility-administered medications for this visit.    Allergies:   Nsaids and Tape   Social History:  The patient  reports that she has quit smoking. Her smoking use included cigarettes. She has a 5.00 pack-year smoking history. She has never used smokeless tobacco. She reports that she does not drink alcohol or use drugs.   Family History:  The patient's family history includes Bladder Cancer in her father; Cancer in her brother and sister; Heart failure in her mother.   ROS:  Please see the history of present illness.   Otherwise, review of systems is positive for none.   All other systems are reviewed and negative.   PHYSICAL EXAM: VS:  BP 124/68   Pulse 70   Ht 5\' 7"  (1.702 m)   Wt 280 lb (127 kg)   SpO2 95%   BMI 43.85 kg/m  , BMI Body mass index is 43.85 kg/m. GEN: Well nourished, well developed, in no acute distress  HEENT: normal  Neck: no JVD, carotid bruits, or masses Cardiac: RRR; no murmurs, rubs, or gallops,no edema  Respiratory:  clear to  auscultation bilaterally, normal work of breathing GI: soft, nontender, nondistended, + BS MS: no deformity or atrophy  Skin: warm and dry, device site well healed Neuro:  Strength and sensation are intact Psych: euthymic mood, full affect  EKG:  EKG is ordered today. Personal review of the ekg ordered shows sinus rhythm, ventricular paced  Personal review of the device interrogation today. Results in [place]: No results found for requested labs within last 8760 hours.    Lipid Panel     Component Value Date/Time   CHOL 126 11/01/2017 1213   TRIG  127 11/01/2017 1213   HDL 58 11/01/2017 1213   CHOLHDL 2.2 11/01/2017 1213   LDLCALC 43 11/01/2017 1213     Wt Readings from Last 3 Encounters:  06/24/19 280 lb (127 kg)  03/26/18 275 lb 12.7 oz (125.1 kg)  03/19/18 271 lb 12.8 oz (123.3 kg)      Other studies Reviewed: Additional studies/ records that were reviewed today include: TTE 04/21/16, hospital records  Review of the above records today demonstrates:  - Left ventricle: The cavity size was normal. Wall thickness was   increased in a pattern of severe LVH. Systolic function was   normal. The estimated ejection fraction was in the range of 60%   to 65%. Wall motion was normal; there were no regional wall   motion abnormalities. Doppler parameters are consistent with   abnormal left ventricular relaxation (grade 1 diastolic   dysfunction). - Aortic valve: Moderately to severely calcified annulus. Severely   thickened, severely calcified leaflets. There was moderate to   severe stenosis. There was trivial regurgitation. - Mitral valve: Severely calcified annulus. Severely calcified   leaflets . The findings are consistent with moderate stenosis.   There was mild to moderate regurgitation. Valve area by pressure   half-time: 1.86 cm^2. - Left atrium: The atrium was mildly dilated.   ASSESSMENT AND PLAN:  1.  Complete AV block: Status post Saint Jude  dual-chamber pacemaker implanted 04/13/2016.  Device functioning appropriately.  No changes.   2. Hypertension: Blood pressure is well controlled  3. Moderate aortic stenosis: Status post TAVR.  Plan per primary cardiology.  4. Atrial tachycardia: Found on device interrogation.  Currently on metoprolol.  Symptoms of palpitations greatly improved.    5.  Diastolic heart failure: She has very significant lower extremity edema.  She is currently on Lasix, but this is not unfortunately doing the trick.  We Kenny Rea increase her Lasix to 40 mg twice daily for 5 days.  We Makhiya Coburn arrange for follow-up in clinic next week with one of the APP's.  Current medicines are reviewed at length with the patient today.   The patient does not have concerns regarding her medicines.  The following changes were made today: Increase Lasix  Labs/ tests ordered today include:  Orders Placed This Encounter  Procedures  . EKG 12-Lead     Disposition:   FU with Tilda Samudio 12 months  Signed, Matt Delpizzo Meredith Leeds, MD  06/24/2019 4:21 PM     [place] [id number] [phone] (office) [phone] (fax)

## 2019-06-27 ENCOUNTER — Ambulatory Visit: Payer: Medicare Other | Admitting: Cardiovascular Disease

## 2019-06-27 DIAGNOSIS — M1711 Unilateral primary osteoarthritis, right knee: Secondary | ICD-10-CM | POA: Diagnosis not present

## 2019-06-27 DIAGNOSIS — M1712 Unilateral primary osteoarthritis, left knee: Secondary | ICD-10-CM | POA: Diagnosis not present

## 2019-06-30 DIAGNOSIS — F339 Major depressive disorder, recurrent, unspecified: Secondary | ICD-10-CM | POA: Diagnosis not present

## 2019-06-30 DIAGNOSIS — E1169 Type 2 diabetes mellitus with other specified complication: Secondary | ICD-10-CM | POA: Diagnosis not present

## 2019-06-30 DIAGNOSIS — E1122 Type 2 diabetes mellitus with diabetic chronic kidney disease: Secondary | ICD-10-CM | POA: Diagnosis not present

## 2019-06-30 DIAGNOSIS — N183 Chronic kidney disease, stage 3 unspecified: Secondary | ICD-10-CM | POA: Diagnosis not present

## 2019-06-30 DIAGNOSIS — Z853 Personal history of malignant neoplasm of breast: Secondary | ICD-10-CM | POA: Diagnosis not present

## 2019-06-30 DIAGNOSIS — E7800 Pure hypercholesterolemia, unspecified: Secondary | ICD-10-CM | POA: Diagnosis not present

## 2019-06-30 DIAGNOSIS — I10 Essential (primary) hypertension: Secondary | ICD-10-CM | POA: Diagnosis not present

## 2019-07-02 NOTE — Progress Notes (Signed)
Cardiology Clinic Note   Patient Name: [name] Date of Encounter: 07/03/2019  Primary Care Provider:  Leighton Ruff, MD Primary Cardiologist:  Emily Latch, MD  Patient Profile    [name] 84 year old female presents today for follow-up evaluation of her acute on chronic diastolic heart failure.  Past Medical History    Past Medical History:  Diagnosis Date  . Anxiety   . Aortic stenosis   . Arthritis   . Breast cancer [facility])    CANCER  . Diabetes mellitus    Type II  . Dyspnea   . Heart murmur   . History of chemotherapy   . History of kidney stones   . Hypertension   . Mitral stenosis   . Morbid obesity (HCC)    BMI > 40  . Ovarian cancer ([place])   . Presence of permanent cardiac pacemaker    St. Jude  . Pulmonary embolism ([place]) 2013   post op  . 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 Spinal stenosis    Past Surgical History:  Procedure Laterality Date  . ABDOMINAL HYSTERECTOMY  2013  . BREAST SURGERY  03-2004   lumpectomy   . CARDIAC CATHETERIZATION    . INSERT / REPLACE / REMOVE PACEMAKER    . INTRAOPERATIVE TRANSTHORACIC ECHOCARDIOGRAM  07/17/2017   Procedure: INTRAOPERATIVE TRANSTHORACIC ECHOCARDIOGRAM;  Surgeon: Emily Mocha, MD;  Location: [place];  Service: Open Heart Surgery;;  . LAPAROTOMY N/A 03/22/2018   Procedure: grahams pouch, repair of bowel perforation;  Surgeon: [name], Emily Bruce, MD;  Location: [place];  Service: General;  Laterality: N/A;  . LITHOTRIPSY    . PACEMAKER IMPLANT N/A 04/21/2016   Procedure: Pacemaker Implant;  Surgeon: Emily Meredith Leeds, MD;  Location: [place] CV LAB;  Service: Cardiovascular;  Laterality: N/A;  . TONSILLECTOMY    . TRANSCATHETER AORTIC VALVE REPLACEMENT, TRANSFEMORAL N/A 07/17/2017   Procedure: TRANSCATHETER AORTIC VALVE REPLACEMENT, TRANSFEMORAL;  Surgeon:  Emily Mocha, MD;  Location: [place];  Service: Open Heart Surgery;  Laterality: N/A;    Allergies  Allergies  Allergen Reactions  . Nsaids Anaphylaxis  . Tape Rash    CLEAR PLASTIC TAPE    History of Present Illness    [name] has a PMH of diabetes, hypertension, ovarian cancer, breast cancer status post left lung, obesity arthritis, spinal stenosis, and complete heart block.  She presented to [facility]/20/2016 with worsening shortness of breath and lower extremity edema.  She was found to be in CHB.  She was admitted to the hospital and her metoprolol was held.  Her heart block continued and she received a Saint Jude dual-chamber pacemaker on 04/21/2016.  She was last seen by Dr. Curt [name] on 06/24/2019.  During that time she was noted to have increased lower extremity edema.  Her Lasix was increased to 40 mg twice daily for 5 days.  She denied palpitations, chest pain, shortness of breath, orthopnea, PND, claudication, syncope, presyncope bleeding, and neurologic sequela.  She is tolerating her medications well.  She indicated was up to her knees.  She was sleeping in the recliner and was trying to elevate her legs.  She presents the clinic today for further evaluation and states she is feeling better after her increased dosing of Lasix.  She has started to eat a lower sodium diet.  She has limited with her physical activity due to  arthritis.  She has no increased work of breathing today.  She states she tries to elevate her lower extremities through the day but has been limited by her recliner.  I Emily give her the salty 6 sheet, order a BMP today, give her the [place] support stockings information, average daily weight log, have her take furosemide 40 mg twice daily Monday Wednesday Friday and Sunday and 40 mg daily on Tuesday Thursday Saturday.  Plan for follow-up with Dr. In 1 months.  Today she denies chest pain, shortness of breath, lower extremity edema, fatigue, palpitations,  melena, hematuria, hemoptysis, diaphoresis, weakness, presyncope, syncope, orthopnea, and PND.   Home Medications    Prior to Admission medications   Medication Sig Start Date End Date Taking? Authorizing Provider  acetaminophen (TYLENOL) 650 MG CR tablet Take 1,300 mg by mouth every 8 (eight) hours as needed for pain.    [provider]  amoxicillin (AMOXIL) 500 MG tablet Take 4 tablets (2,000 mg total) by mouth as directed. 1 hour prior to dental work including cleanings 06/25/18   Emily Stanford, PA-C  atorvastatin (LIPITOR) 20 MG tablet Take 20 mg by mouth at bedtime.     [provider]  Calcium Citrate-Vitamin D (CALCIUM CITRATE + PO) Take 1 tablet by mouth 2 (two) times daily.     [provider]  cholecalciferol (VITAMIN D) 1000 units tablet Take 1,000 Units by mouth 2 (two) times daily.     [provider]  fish oil-omega-3 fatty acids 1000 MG capsule Take 1 g by mouth every evening.     [provider]  FLUoxetine (PROZAC) 40 MG capsule Take 40 mg by mouth daily.     [provider]  furosemide (LASIX) 40 MG tablet TAKE 1 TABLET BY MOUTH TWICE DAILY 01/06/19   Emily Latch, MD  LEVEMIR FLEXTOUCH 100 UNIT/ML Pen Inject 60-70 Units into the skin 2 (two) times daily. Per sliding scale 03/20/16   [provider]  losartan (COZAAR) 50 MG tablet Take 50 mg by mouth daily.    [provider]  Magnesium 400 MG CAPS Take 400 mg by mouth at bedtime.     [provider]  metFORMIN (GLUCOPHAGE) 1000 MG tablet Take 1,000 mg by mouth 2 (two) times daily with a meal.     [provider]  metoprolol tartrate (LOPRESSOR) 25 MG tablet TAKE 1 TABLET(25 MG) BY MOUTH TWICE DAILY 07/10/18   Emily Mocha, MD  Multiple Vitamin (MULTIVITAMIN) tablet Take 1 tablet by mouth daily.      [provider]  pantoprazole (PROTONIX) 40 MG tablet Take 1 tablet (40 mg total) by mouth 2 (two) times daily. 03/28/18    [name], Emily Hamper, PA-C  saccharomyces boulardii (FLORASTOR) 250 MG capsule Take 1 capsule (250 mg total) by mouth 2 (two) times daily. You can find a probiotic over the counter. Take this while you are on antibiotics. 03/28/18   [name], Emily Hamper, PA-C    Family History    Family History  Problem Relation Age of Onset  . Bladder Cancer Father   . Cancer Brother   . Cancer Sister   . Heart failure Mother    She indicated that her mother is deceased. She indicated that her father is deceased. She indicated that only one of her two sisters is alive. She indicated that her brother is deceased. She indicated that her maternal grandmother is deceased. She indicated that her maternal grandfather is deceased. She indicated that  her paternal grandmother is deceased. She indicated that her paternal grandfather is deceased.  Social History    Social History   Socioeconomic History  . Marital status: Widowed    Spouse name: Not on file  . Number of children: Not on file  . Years of education: Not on file  . Highest education level: High school graduate  Occupational History  . Not on file  Tobacco Use  . Smoking status: Former Smoker    Packs/day: 1.00    Years: 5.00    Pack years: 5.00    Types: Cigarettes  . Smokeless tobacco: Never Used  Substance and Sexual Activity  . Alcohol use: No  . Drug use: No  . Sexual activity: Not on file  Other Topics Concern  . Not on file  Social History Narrative  . Not on file   Social Determinants of Health   Financial Resource Strain:   . Difficulty of Paying Living Expenses:   Food Insecurity:   . Worried About Charity fundraiser in the Last Year:   . Arboriculturist in the Last Year:   Transportation Needs:   . Film/video editor (Medical):   [name] Kitchen Lack of Transportation (Non-Medical):   Physical Activity:   . Days of Exercise per Week:   . Minutes of Exercise per Session:   Stress:   . Feeling of Stress :   Social Connections:   .  Frequency of Communication with Friends and Family:   . Frequency of Social Gatherings with Friends and Family:   . Attends Religious Services:   . Active Member of Clubs or Organizations:   . Attends Archivist Meetings:   [name] Kitchen Marital Status:   Intimate Partner Violence:   . Fear of Current or Ex-Partner:   . Emotionally Abused:   [name] Kitchen Physically Abused:   . Sexually Abused:      Review of Systems    General:  No chills, fever, night sweats or weight changes.  Cardiovascular:  No chest pain, dyspnea on exertion, edema, orthopnea, palpitations, paroxysmal nocturnal dyspnea. Dermatological: No rash, lesions/masses Respiratory: No cough, dyspnea Urologic: No hematuria, dysuria Abdominal:   No nausea, vomiting, diarrhea, bright red blood per rectum, melena, or hematemesis Neurologic:  No visual changes, wkns, changes in mental status. All other systems reviewed and are otherwise negative except as noted above.  Physical Exam    VS:  BP 128/70   Pulse 79   Ht 5\' 7"  (1.702 m)   Wt 277 lb 3.2 oz (125.7 kg)   BMI 43.42 kg/m  , BMI Body mass index is 43.42 kg/m. GEN: Well nourished, well developed, in no acute distress. HEENT: normal. Neck: Supple, no JVD, carotid bruits, or masses. Cardiac: RRR, no murmurs, rubs, or gallops. No clubbing, cyanosis, generalized bilateral lower extremity nonpitting edema.  Radials/DP/PT 2+ and equal bilaterally.  Respiratory:  Respirations regular and unlabored, clear to auscultation bilaterally. GI: Soft, nontender, nondistended, BS + x 4. MS: no deformity or atrophy. Skin: warm and dry, no rash. Neuro:  Strength and sensation are intact. Psych: Normal affect.  Accessory Clinical Findings    ECG personally reviewed by me today-none today.  Echocardiogram 06/24/2019 IMPRESSIONS    1. Left ventricular ejection fraction, by estimation, is 60 to 65%. The  left ventricle has normal function. The left ventricle has no regional  wall motion  abnormalities. There is moderate concentric left ventricular  hypertrophy. Left ventricular  diastolic function could not be evaluated.  2. Right ventricular systolic function is normal. The right ventricular  size is normal. There is mildly elevated pulmonary artery systolic  pressure. The estimated right ventricular systolic pressure is 123456 mmHg.  3. Left atrial size was mildly dilated.  4. MVA by continuity 1.74 cm2. The mitral valve is degenerative. No  evidence of mitral valve regurgitation. Mild mitral stenosis. The mean  mitral valve gradient is 6.0 mmHg with average heart rate of 67 bpm.  5. EOA 1.37 cm2. DI 0.37. Trivial paravalvular leak noted. The aortic  valve has been repaired/replaced. Aortic valve regurgitation is trivial.  There is a 26 mm Edwards Sapien prosthetic (TAVR) valve present in the  aortic position. Procedure Date:  07/17/2017. Aortic valve mean gradient measures 15.0 mmHg. Aortic valve  Vmax measures 2.64 m/s.  6. The inferior vena cava is normal in size with greater than 50%  respiratory variability, suggesting right atrial pressure of 3 mmHg.   Comparison(s): No significant change from prior study. Trivial PVL. Stable  gradients across TAVR compared with prior. Mild mitral stenosis.  Assessment & Plan   1.  Acute on chronic diastolic heart failure-euvolemic today.  Echocardiogram 06/24/2019 showed normal LVEF, diastolic function could not be assessed, normal functioning TAVR valve Resume furosemide 40 mg daily on Tuesday Thursday Saturday Increase furosemide to 80 mg twice daily on Monday Wednesday Friday Sunday Continue current medical therapy Heart healthy low-sodium diet-salty 6 given Increase physical activity as tolerated Lower extremity support stockings-sheet given Elevate extremities when not active Daily weights-1 given Order BMP  Essential hypertension-BP today 128/70.  Well-controlled at home Continue current medical therapy Heart  healthy low-sodium diet Increase physical activity as tolerated  Moderate aortic stenosis-status post TAVR.  Echocardiogram 06/24/2019 showed normal LVEF and normally functioning TAVR valve. Continue to monitor  Complete AV block-no palpitations or recent syncope.  Status post cinch dual-chamber pacemaker inserted on 2/18.  Interrogated 06/24/2019 and functioning properly.  Disposition: Follow-up with Dr. Oval Linsey in 3 months.    Jossie Ng. Thi Sisemore NP-C    07/03/2019, 3:54 PM [address] Office [phone] Fax [phone]

## 2019-07-03 ENCOUNTER — Encounter: Payer: Self-pay | Admitting: General Practice

## 2019-07-03 ENCOUNTER — Other Ambulatory Visit: Payer: Self-pay

## 2019-07-03 ENCOUNTER — Ambulatory Visit (INDEPENDENT_AMBULATORY_CARE_PROVIDER_SITE_OTHER): Payer: Medicare Other | Admitting: General Practice

## 2019-07-03 VITALS — BP 128/70 | HR 79 | Ht 67.0 in | Wt 277.2 lb

## 2019-07-03 DIAGNOSIS — I442 Atrioventricular block, complete: Secondary | ICD-10-CM | POA: Diagnosis not present

## 2019-07-03 DIAGNOSIS — H5203 Hypermetropia, bilateral: Secondary | ICD-10-CM | POA: Diagnosis not present

## 2019-07-03 DIAGNOSIS — H524 Presbyopia: Secondary | ICD-10-CM | POA: Diagnosis not present

## 2019-07-03 DIAGNOSIS — Z952 Presence of prosthetic heart valve: Secondary | ICD-10-CM | POA: Diagnosis not present

## 2019-07-03 DIAGNOSIS — E119 Type 2 diabetes mellitus without complications: Secondary | ICD-10-CM | POA: Diagnosis not present

## 2019-07-03 DIAGNOSIS — I10 Essential (primary) hypertension: Secondary | ICD-10-CM

## 2019-07-03 DIAGNOSIS — Z6841 Body Mass Index (BMI) 40.0 and over, adult: Secondary | ICD-10-CM | POA: Diagnosis not present

## 2019-07-03 DIAGNOSIS — M1711 Unilateral primary osteoarthritis, right knee: Secondary | ICD-10-CM | POA: Diagnosis not present

## 2019-07-03 DIAGNOSIS — I5033 Acute on chronic diastolic (congestive) heart failure: Secondary | ICD-10-CM

## 2019-07-03 DIAGNOSIS — M1712 Unilateral primary osteoarthritis, left knee: Secondary | ICD-10-CM | POA: Diagnosis not present

## 2019-07-03 MED ORDER — FUROSEMIDE 40 MG PO TABS: ORAL_TABLET | ORAL | 1 refills | Status: DC

## 2019-07-03 NOTE — Patient Instructions (Signed)
Medication Instructions:   Change Lasix: Take 40 mg two times daily every Monday, Wednesday, Friday, and Sunday. Take 40 mg once daily every Tuesday, Thursday, and Saturday.  *If you need a refill on your cardiac medications before your next appointment, please call your pharmacy*   Lab Work: Your physician recommends that you return for lab work today: BMET  If you have labs (blood work) drawn today and your tests are completely normal, you will receive your results only by: [name] Kitchen MyChart Message (if you have MyChart) OR . A paper copy in the mail If you have any lab test that is abnormal or we need to change your treatment, we will call you to review the results.   Follow-Up: At [facility], you and your health needs are our priority.  As part of our continuing mission to provide you with exceptional heart care, we have created designated Provider Care Teams.  These Care Teams include your primary Cardiologist (physician) and Advanced Practice Providers (APPs -  Physician Assistants and Nurse Practitioners) who all work together to provide you with the care you need, when you need it.  We recommend signing up for the patient portal called "MyChart".  Sign up information is provided on this After Visit Summary.  MyChart is used to connect with patients for Virtual Visits (Telemedicine).  Patients are able to view lab/test results, encounter notes, upcoming appointments, etc.  Non-urgent messages can be sent to your provider as well.   To learn more about what you can do with MyChart, go to [email or web address].    Your next appointment:   3 month(s)  The format for your next appointment:   In Person  Provider:   Skeet Latch, MD   Other Instructions Your physician recommends that you weigh, daily, at the same time every day, and in the same amount of clothing. Please record your daily weights on the handout provided and bring it to your next appointment. Please call our office  if your weight increases by 3 lbs overnight or 5 lbs in one week.  Please continue your chair exercises.  Please purchase Compression stockings.

## 2019-07-04 LAB — BASIC METABOLIC PANEL
BUN/Creatinine Ratio: 25 (ref 12–28)
BUN: 24 mg/dL (ref 8–27)
CO2: 27 mmol/L (ref 20–29)
Calcium: 10.1 mg/dL (ref 8.7–10.3)
Chloride: 102 mmol/L (ref 96–106)
Creatinine, Ser: 0.96 mg/dL (ref 0.57–1.00)
GFR calc Af Amer: 64 mL/min/{1.73_m2} (ref >59)
GFR calc non Af Amer: 55 mL/min/{1.73_m2} — ABNORMAL LOW (ref >59)
Glucose: 153 mg/dL — ABNORMAL HIGH (ref 65–99)
Potassium: 4.5 mmol/L (ref 3.5–5.2)
Sodium: 144 mmol/L (ref 134–144)

## 2019-07-17 DIAGNOSIS — I10 Essential (primary) hypertension: Secondary | ICD-10-CM | POA: Diagnosis not present

## 2019-07-17 DIAGNOSIS — R1084 Generalized abdominal pain: Secondary | ICD-10-CM | POA: Diagnosis not present

## 2019-07-17 DIAGNOSIS — R0902 Hypoxemia: Secondary | ICD-10-CM | POA: Diagnosis not present

## 2019-07-18 ENCOUNTER — Other Ambulatory Visit: Payer: Self-pay

## 2019-07-18 ENCOUNTER — Inpatient Hospital Stay (HOSPITAL_COMMUNITY)
Admission: EM | Admit: 2019-07-18 | Discharge: 2019-07-22 | DRG: 175 | Disposition: A | Discharge status: Home Health | Payer: Medicare Other | Attending: Internal Medicine | Admitting: Internal Medicine

## 2019-07-18 ENCOUNTER — Inpatient Hospital Stay (HOSPITAL_COMMUNITY): Payer: Medicare Other

## 2019-07-18 ENCOUNTER — Emergency Department (HOSPITAL_COMMUNITY): Payer: Medicare Other

## 2019-07-18 ENCOUNTER — Encounter (HOSPITAL_COMMUNITY): Payer: Self-pay | Admitting: Emergency Medicine

## 2019-07-18 DIAGNOSIS — Z66 Do not resuscitate: Secondary | ICD-10-CM | POA: Diagnosis present

## 2019-07-18 DIAGNOSIS — I442 Atrioventricular block, complete: Secondary | ICD-10-CM | POA: Diagnosis not present

## 2019-07-18 DIAGNOSIS — Z23 Encounter for immunization: Secondary | ICD-10-CM

## 2019-07-18 DIAGNOSIS — R600 Localized edema: Secondary | ICD-10-CM | POA: Diagnosis not present

## 2019-07-18 DIAGNOSIS — I2699 Other pulmonary embolism without acute cor pulmonale: Secondary | ICD-10-CM | POA: Diagnosis not present

## 2019-07-18 DIAGNOSIS — Z20822 Contact with and (suspected) exposure to covid-19: Secondary | ICD-10-CM | POA: Diagnosis present

## 2019-07-18 DIAGNOSIS — M1990 Unspecified osteoarthritis, unspecified site: Secondary | ICD-10-CM | POA: Diagnosis present

## 2019-07-18 DIAGNOSIS — Z87891 Personal history of nicotine dependence: Secondary | ICD-10-CM

## 2019-07-18 DIAGNOSIS — Z853 Personal history of malignant neoplasm of breast: Secondary | ICD-10-CM

## 2019-07-18 DIAGNOSIS — J9601 Acute respiratory failure with hypoxia: Secondary | ICD-10-CM | POA: Diagnosis not present

## 2019-07-18 DIAGNOSIS — I5032 Chronic diastolic (congestive) heart failure: Secondary | ICD-10-CM | POA: Diagnosis present

## 2019-07-18 DIAGNOSIS — R1013 Epigastric pain: Secondary | ICD-10-CM

## 2019-07-18 DIAGNOSIS — B9620 Unspecified Escherichia coli [E. coli] as the cause of diseases classified elsewhere: Secondary | ICD-10-CM | POA: Diagnosis present

## 2019-07-18 DIAGNOSIS — I959 Hypotension, unspecified: Secondary | ICD-10-CM | POA: Diagnosis not present

## 2019-07-18 DIAGNOSIS — M4800 Spinal stenosis, site unspecified: Secondary | ICD-10-CM | POA: Diagnosis present

## 2019-07-18 DIAGNOSIS — R05 Cough: Secondary | ICD-10-CM | POA: Diagnosis not present

## 2019-07-18 DIAGNOSIS — N390 Urinary tract infection, site not specified: Secondary | ICD-10-CM

## 2019-07-18 DIAGNOSIS — E119 Type 2 diabetes mellitus without complications: Secondary | ICD-10-CM | POA: Diagnosis present

## 2019-07-18 DIAGNOSIS — Z794 Long term (current) use of insulin: Secondary | ICD-10-CM

## 2019-07-18 DIAGNOSIS — I110 Hypertensive heart disease with heart failure: Secondary | ICD-10-CM | POA: Diagnosis present

## 2019-07-18 DIAGNOSIS — Z950 Presence of cardiac pacemaker: Secondary | ICD-10-CM | POA: Diagnosis not present

## 2019-07-18 DIAGNOSIS — Z8249 Family history of ischemic heart disease and other diseases of the circulatory system: Secondary | ICD-10-CM | POA: Diagnosis not present

## 2019-07-18 DIAGNOSIS — Z8052 Family history of malignant neoplasm of bladder: Secondary | ICD-10-CM | POA: Diagnosis not present

## 2019-07-18 DIAGNOSIS — R509 Fever, unspecified: Secondary | ICD-10-CM | POA: Diagnosis not present

## 2019-07-18 DIAGNOSIS — Z923 Personal history of irradiation: Secondary | ICD-10-CM

## 2019-07-18 DIAGNOSIS — Z953 Presence of xenogenic heart valve: Secondary | ICD-10-CM

## 2019-07-18 DIAGNOSIS — M7989 Other specified soft tissue disorders: Secondary | ICD-10-CM | POA: Diagnosis not present

## 2019-07-18 DIAGNOSIS — I2609 Other pulmonary embolism with acute cor pulmonale: Secondary | ICD-10-CM

## 2019-07-18 DIAGNOSIS — R0602 Shortness of breath: Secondary | ICD-10-CM | POA: Diagnosis not present

## 2019-07-18 DIAGNOSIS — Z886 Allergy status to analgesic agent status: Secondary | ICD-10-CM

## 2019-07-18 DIAGNOSIS — R000 Tachycardia, unspecified: Secondary | ICD-10-CM | POA: Diagnosis present

## 2019-07-18 DIAGNOSIS — Z9221 Personal history of antineoplastic chemotherapy: Secondary | ICD-10-CM

## 2019-07-18 DIAGNOSIS — Z8543 Personal history of malignant neoplasm of ovary: Secondary | ICD-10-CM | POA: Diagnosis not present

## 2019-07-18 DIAGNOSIS — M2550 Pain in unspecified joint: Secondary | ICD-10-CM | POA: Diagnosis not present

## 2019-07-18 DIAGNOSIS — Z91048 Other nonmedicinal substance allergy status: Secondary | ICD-10-CM

## 2019-07-18 DIAGNOSIS — R5381 Other malaise: Secondary | ICD-10-CM | POA: Diagnosis not present

## 2019-07-18 DIAGNOSIS — Z86711 Personal history of pulmonary embolism: Secondary | ICD-10-CM | POA: Diagnosis not present

## 2019-07-18 DIAGNOSIS — I10 Essential (primary) hypertension: Secondary | ICD-10-CM | POA: Diagnosis not present

## 2019-07-18 DIAGNOSIS — Z7401 Bed confinement status: Secondary | ICD-10-CM | POA: Diagnosis not present

## 2019-07-18 DIAGNOSIS — J9621 Acute and chronic respiratory failure with hypoxia: Secondary | ICD-10-CM | POA: Diagnosis not present

## 2019-07-18 DIAGNOSIS — E785 Hyperlipidemia, unspecified: Secondary | ICD-10-CM | POA: Diagnosis present

## 2019-07-18 LAB — HEMOGLOBIN A1C
Hgb A1c MFr Bld: 7 % — ABNORMAL HIGH (ref 4.8–5.6)
Mean Plasma Glucose: 154.2 mg/dL

## 2019-07-18 LAB — CBC WITH DIFFERENTIAL/PLATELET
Abs Immature Granulocytes: 0.04 10*3/uL (ref 0.00–0.07)
Basophils Absolute: 0 10*3/uL (ref 0.0–0.1)
Basophils Relative: 0 %
Eosinophils Absolute: 0 10*3/uL (ref 0.0–0.5)
Eosinophils Relative: 0 %
HCT: 41.6 % (ref 36.0–46.0)
Hemoglobin: 13.9 g/dL (ref 12.0–15.0)
Immature Granulocytes: 0 %
Lymphocytes Relative: 12 %
Lymphs Abs: 1.3 10*3/uL (ref 0.7–4.0)
MCH: 31.8 pg (ref 26.0–34.0)
MCHC: 33.4 g/dL (ref 30.0–36.0)
MCV: 95.2 fL (ref 80.0–100.0)
Monocytes Absolute: 1.2 10*3/uL — ABNORMAL HIGH (ref 0.1–1.0)
Monocytes Relative: 11 %
Neutro Abs: 8.6 10*3/uL — ABNORMAL HIGH (ref 1.7–7.7)
Neutrophils Relative %: 77 %
Platelets: 171 10*3/uL (ref 150–400)
RBC: 4.37 MIL/uL (ref 3.87–5.11)
RDW: 14.1 % (ref 11.5–15.5)
WBC: 11.1 10*3/uL — ABNORMAL HIGH (ref 4.0–10.5)
nRBC: 0 % (ref 0.0–0.2)

## 2019-07-18 LAB — TROPONIN I (HIGH SENSITIVITY)
Troponin I (High Sensitivity): 15 ng/L (ref <18)
Troponin I (High Sensitivity): 17 ng/L (ref <18)

## 2019-07-18 LAB — URINALYSIS, ROUTINE W REFLEX MICROSCOPIC
Bilirubin Urine: NEGATIVE
Glucose, UA: NEGATIVE mg/dL
Ketones, ur: 20 mg/dL — AB
Nitrite: POSITIVE — AB
Protein, ur: 100 mg/dL — AB
Specific Gravity, Urine: 1.014 (ref 1.005–1.030)
WBC, UA: >50 WBC/hpf — ABNORMAL HIGH (ref 0–5)
pH: 5 (ref 5.0–8.0)

## 2019-07-18 LAB — COMPREHENSIVE METABOLIC PANEL
ALT: 16 U/L (ref 0–44)
AST: 18 U/L (ref 15–41)
Albumin: 4.1 g/dL (ref 3.5–5.0)
Alkaline Phosphatase: 68 U/L (ref 38–126)
Anion gap: 12 (ref 5–15)
BUN: 21 mg/dL (ref 8–23)
CO2: 25 mmol/L (ref 22–32)
Calcium: 8.9 mg/dL (ref 8.9–10.3)
Chloride: 98 mmol/L (ref 98–111)
Creatinine, Ser: 0.97 mg/dL (ref 0.44–1.00)
GFR calc Af Amer: >60 mL/min (ref >60)
GFR calc non Af Amer: 54 mL/min — ABNORMAL LOW (ref >60)
Glucose, Bld: 210 mg/dL — ABNORMAL HIGH (ref 70–99)
Potassium: 4.3 mmol/L (ref 3.5–5.1)
Sodium: 135 mmol/L (ref 135–145)
Total Bilirubin: 1.2 mg/dL (ref 0.3–1.2)
Total Protein: 7.6 g/dL (ref 6.5–8.1)

## 2019-07-18 LAB — BLOOD GAS, ARTERIAL
Acid-Base Excess: 1.4 mmol/L (ref 0.0–2.0)
Bicarbonate: 25.8 mmol/L (ref 20.0–28.0)
FIO2: 21
O2 Saturation: 84.7 %
Patient temperature: 98.6
pCO2 arterial: 41.8 mmHg (ref 32.0–48.0)
pH, Arterial: 7.407 (ref 7.350–7.450)
pO2, Arterial: 51.1 mmHg — ABNORMAL LOW (ref 83.0–108.0)

## 2019-07-18 LAB — APTT: aPTT: 126 seconds — ABNORMAL HIGH (ref 24–36)

## 2019-07-18 LAB — BRAIN NATRIURETIC PEPTIDE: B Natriuretic Peptide: 261.6 pg/mL — ABNORMAL HIGH (ref 0.0–100.0)

## 2019-07-18 LAB — HEPARIN LEVEL (UNFRACTIONATED): Heparin Unfractionated: 0.57 IU/mL (ref 0.30–0.70)

## 2019-07-18 LAB — CBG MONITORING, ED
Glucose-Capillary: 183 mg/dL — ABNORMAL HIGH (ref 70–99)
Glucose-Capillary: 191 mg/dL — ABNORMAL HIGH (ref 70–99)

## 2019-07-18 LAB — PROTIME-INR
INR: 1.3 — ABNORMAL HIGH (ref 0.8–1.2)
Prothrombin Time: 16 seconds — ABNORMAL HIGH (ref 11.4–15.2)

## 2019-07-18 LAB — SARS CORONAVIRUS 2 BY RT PCR (HOSPITAL ORDER, PERFORMED IN ~~LOC~~ HOSPITAL LAB): SARS Coronavirus 2: NEGATIVE

## 2019-07-18 LAB — LIPASE, BLOOD: Lipase: 21 U/L (ref 11–51)

## 2019-07-18 LAB — GLUCOSE, CAPILLARY: Glucose-Capillary: 154 mg/dL — ABNORMAL HIGH (ref 70–99)

## 2019-07-18 LAB — LACTIC ACID, PLASMA: Lactic Acid, Venous: 1.4 mmol/L (ref 0.5–1.9)

## 2019-07-18 MED ORDER — ADULT MULTIVITAMIN W/MINERALS CH
1.0000 | ORAL_TABLET | Freq: Every day | ORAL | Status: DC
  Administered 2019-07-18 – 2019-07-21 (×4): 1 via ORAL
  Filled 2019-07-18 (×4): qty 1

## 2019-07-18 MED ORDER — ONDANSETRON HCL 4 MG/2ML IJ SOLN
4.0000 mg | Freq: Once | INTRAMUSCULAR | Status: AC
  Administered 2019-07-18: 4 mg via INTRAVENOUS
  Filled 2019-07-18: qty 2

## 2019-07-18 MED ORDER — ACETAMINOPHEN 650 MG RE SUPP: 650.0000 mg | Freq: Four times a day (QID) | RECTAL | Status: DC | PRN

## 2019-07-18 MED ORDER — CEPHALEXIN 500 MG PO CAPS
500.0000 mg | ORAL_CAPSULE | Freq: Two times a day (BID) | ORAL | 0 refills | Status: DC
Start: 2019-07-18 — End: 2019-07-21

## 2019-07-18 MED ORDER — INSULIN ASPART 100 UNIT/ML ~~LOC~~ SOLN
0.0000 [IU] | Freq: Every day | SUBCUTANEOUS | Status: DC
  Filled 2019-07-18: qty 0.05

## 2019-07-18 MED ORDER — ONDANSETRON HCL 4 MG/2ML IJ SOLN
INTRAMUSCULAR | Status: AC
  Administered 2019-07-18: 4 mg via INTRAVENOUS
  Filled 2019-07-18: qty 2

## 2019-07-18 MED ORDER — ATORVASTATIN CALCIUM 20 MG PO TABS
20.0000 mg | ORAL_TABLET | Freq: Every day | ORAL | Status: DC
  Administered 2019-07-18 – 2019-07-21 (×4): 20 mg via ORAL
  Filled 2019-07-18 (×2): qty 2
  Filled 2019-07-18 (×2): qty 1

## 2019-07-18 MED ORDER — OMEGA-3 FATTY ACIDS 1000 MG PO CAPS: 1.0000 g | ORAL_CAPSULE | Freq: Every evening | ORAL | Status: DC

## 2019-07-18 MED ORDER — PANTOPRAZOLE SODIUM 40 MG PO TBEC
40.0000 mg | DELAYED_RELEASE_TABLET | Freq: Two times a day (BID) | ORAL | Status: DC
  Administered 2019-07-18 – 2019-07-21 (×8): 40 mg via ORAL
  Filled 2019-07-18 (×8): qty 1

## 2019-07-18 MED ORDER — SODIUM CHLORIDE 0.9 % IV SOLN
1.0000 g | INTRAVENOUS | Status: DC
  Administered 2019-07-19 – 2019-07-20 (×2): 1 g via INTRAVENOUS
  Filled 2019-07-18: qty 1
  Filled 2019-07-18 (×2): qty 10

## 2019-07-18 MED ORDER — INSULIN ASPART 100 UNIT/ML ~~LOC~~ SOLN
0.0000 [IU] | Freq: Three times a day (TID) | SUBCUTANEOUS | Status: DC
  Administered 2019-07-18: 3 [IU] via SUBCUTANEOUS
  Administered 2019-07-19: 2 [IU] via SUBCUTANEOUS
  Administered 2019-07-19: 3 [IU] via SUBCUTANEOUS
  Administered 2019-07-19: 2 [IU] via SUBCUTANEOUS
  Filled 2019-07-18: qty 0.15

## 2019-07-18 MED ORDER — FUROSEMIDE 40 MG PO TABS: 40.0000 mg | ORAL_TABLET | Freq: Every day | ORAL | Status: DC

## 2019-07-18 MED ORDER — PANTOPRAZOLE SODIUM 40 MG PO TBEC: 40.0000 mg | DELAYED_RELEASE_TABLET | Freq: Two times a day (BID) | ORAL | 0 refills | Status: DC

## 2019-07-18 MED ORDER — VITAMIN D 25 MCG (1000 UNIT) PO TABS: 1000.0000 [IU] | ORAL_TABLET | Freq: Two times a day (BID) | ORAL | Status: DC

## 2019-07-18 MED ORDER — FLUOXETINE HCL 20 MG PO CAPS
40.0000 mg | ORAL_CAPSULE | Freq: Every day | ORAL | Status: DC
  Administered 2019-07-18 – 2019-07-21 (×4): 40 mg via ORAL
  Filled 2019-07-18 (×5): qty 2

## 2019-07-18 MED ORDER — SACCHAROMYCES BOULARDII 250 MG PO CAPS
250.0000 mg | ORAL_CAPSULE | Freq: Two times a day (BID) | ORAL | Status: DC
  Administered 2019-07-18 – 2019-07-21 (×7): 250 mg via ORAL
  Filled 2019-07-18 (×7): qty 1

## 2019-07-18 MED ORDER — ORAL CARE MOUTH RINSE
15.0000 mL | Freq: Two times a day (BID) | OROMUCOSAL | Status: DC
  Administered 2019-07-19 – 2019-07-21 (×6): 15 mL via OROMUCOSAL

## 2019-07-18 MED ORDER — OMEGA-3-ACID ETHYL ESTERS 1 G PO CAPS
1.0000 g | ORAL_CAPSULE | Freq: Every day | ORAL | Status: DC
  Administered 2019-07-19 – 2019-07-21 (×3): 1 g via ORAL
  Filled 2019-07-18 (×3): qty 1

## 2019-07-18 MED ORDER — SENNOSIDES-DOCUSATE SODIUM 8.6-50 MG PO TABS: 1.0000 | ORAL_TABLET | Freq: Every evening | ORAL | Status: DC | PRN

## 2019-07-18 MED ORDER — PANTOPRAZOLE SODIUM 40 MG IV SOLR
40.0000 mg | Freq: Once | INTRAVENOUS | Status: AC
  Administered 2019-07-18: 40 mg via INTRAVENOUS
  Filled 2019-07-18: qty 40

## 2019-07-18 MED ORDER — ONDANSETRON HCL 4 MG PO TABS: 4.0000 mg | ORAL_TABLET | Freq: Four times a day (QID) | ORAL | Status: DC | PRN

## 2019-07-18 MED ORDER — IOHEXOL 350 MG/ML SOLN
100.0000 mL | Freq: Once | INTRAVENOUS | Status: AC | PRN
  Administered 2019-07-18: 100 mL via INTRAVENOUS

## 2019-07-18 MED ORDER — VITAMIN D 25 MCG (1000 UNIT) PO TABS
1000.0000 [IU] | ORAL_TABLET | Freq: Two times a day (BID) | ORAL | Status: DC
  Administered 2019-07-18 – 2019-07-21 (×7): 1000 [IU] via ORAL
  Filled 2019-07-18 (×8): qty 1

## 2019-07-18 MED ORDER — ACETAMINOPHEN 325 MG PO TABS
650.0000 mg | ORAL_TABLET | Freq: Four times a day (QID) | ORAL | Status: DC | PRN
  Administered 2019-07-18 – 2019-07-21 (×6): 650 mg via ORAL
  Filled 2019-07-18 (×6): qty 2

## 2019-07-18 MED ORDER — FUROSEMIDE 40 MG PO TABS
40.0000 mg | ORAL_TABLET | ORAL | Status: DC
  Administered 2019-07-19: 40 mg via ORAL
  Filled 2019-07-18: qty 1

## 2019-07-18 MED ORDER — HEPARIN (PORCINE) 25000 UT/250ML-% IV SOLN
1850.0000 [IU]/h | INTRAVENOUS | Status: DC
  Administered 2019-07-18 – 2019-07-19 (×2): 1850 [IU]/h via INTRAVENOUS
  Filled 2019-07-18: qty 250

## 2019-07-18 MED ORDER — INSULIN DETEMIR 100 UNIT/ML ~~LOC~~ SOLN
55.0000 [IU] | Freq: Two times a day (BID) | SUBCUTANEOUS | Status: DC
  Administered 2019-07-18 – 2019-07-21 (×7): 55 [IU] via SUBCUTANEOUS
  Filled 2019-07-18 (×7): qty 0.55

## 2019-07-18 MED ORDER — FUROSEMIDE 40 MG PO TABS
40.0000 mg | ORAL_TABLET | ORAL | Status: DC
  Administered 2019-07-20 – 2019-07-21 (×4): 40 mg via ORAL
  Filled 2019-07-18 (×5): qty 1

## 2019-07-18 MED ORDER — HEPARIN BOLUS VIA INFUSION
3500.0000 [IU] | Freq: Once | INTRAVENOUS | Status: AC
  Administered 2019-07-18: 3500 [IU] via INTRAVENOUS
  Filled 2019-07-18: qty 3500

## 2019-07-18 MED ORDER — CHLORHEXIDINE GLUCONATE CLOTH 2 % EX PADS
6.0000 | MEDICATED_PAD | Freq: Every day | CUTANEOUS | Status: DC
  Administered 2019-07-19: 6 via TOPICAL

## 2019-07-18 MED ORDER — ACETAMINOPHEN 325 MG PO TABS
650.0000 mg | ORAL_TABLET | Freq: Once | ORAL | Status: AC
  Administered 2019-07-18: 650 mg via ORAL
  Filled 2019-07-18: qty 2

## 2019-07-18 MED ORDER — INSULIN DETEMIR 100 UNIT/ML FLEXPEN: 55.0000 [IU] | PEN_INJECTOR | Freq: Two times a day (BID) | SUBCUTANEOUS | Status: DC

## 2019-07-18 MED ORDER — MAGNESIUM 400 MG PO CAPS: 400.0000 mg | ORAL_CAPSULE | Freq: Every day | ORAL | Status: DC

## 2019-07-18 MED ORDER — ONDANSETRON HCL 4 MG/2ML IJ SOLN: 4.0000 mg | Freq: Four times a day (QID) | INTRAMUSCULAR | Status: DC | PRN

## 2019-07-18 MED ORDER — ONDANSETRON HCL 4 MG/2ML IJ SOLN: 4.0000 mg | Freq: Once | INTRAMUSCULAR | Status: AC

## 2019-07-18 MED ORDER — SODIUM CHLORIDE 0.9 % IV SOLN
1.0000 g | Freq: Once | INTRAVENOUS | Status: AC
  Administered 2019-07-18: 1 g via INTRAVENOUS
  Filled 2019-07-18: qty 10

## 2019-07-18 MED ORDER — SODIUM CHLORIDE (PF) 0.9 % IJ SOLN
INTRAMUSCULAR | Status: AC
  Filled 2019-07-18: qty 50

## 2019-07-18 MED ORDER — MAGNESIUM OXIDE 400 (241.3 MG) MG PO TABS
400.0000 mg | ORAL_TABLET | Freq: Every day | ORAL | Status: DC
  Administered 2019-07-18 – 2019-07-21 (×4): 400 mg via ORAL
  Filled 2019-07-18 (×4): qty 1

## 2019-07-18 NOTE — ED Provider Notes (Addendum)
Patient had been discharged by Dr. Florina Ou after being treated for UTI.  At time of discharge patient noted to be hypoxic and blood gas confirmed this.  Patient does note increased dyspnea and will order chest CT to rule out PE.  11:35 AM Patient's CT of her chest consistent with pulmonary embolus with right heart strain.  Heparin ordered per pharmacy.  Will admit to the hospitalist service   Lacretia Leigh, MD 07/18/19 [id number]    Lacretia Leigh, MD 07/18/19 [id number]

## 2019-07-18 NOTE — ED Notes (Signed)
Patient transferred to hospital bed for more comfort.

## 2019-07-18 NOTE — ED Notes (Signed)
Patient transported to CT 

## 2019-07-18 NOTE — H&P (Signed)
History and Physical    [name] [id number] DOB: 1936/01/27 DOA: 07/18/2019  PCP: Emily Ruff, MD   Patient coming from: [facility] with sister  Chief Complaint: Epigastric discomfort  HPI: [name] is [name] 84 y.o. female with medical history significant for history of ovarian cancer status post TSH BSO in 2012 also had [place] and Taxol chemo, breast cancer-status post lumpectomy in 2005, diabetes mellitus on insulin, hypertension, pacemaker in place, mitral stenosis/aortic stenosis, morbid obesity, [name]  Patient also complains of dysuria and burning urine.  She has history of perforated duodenal ulcer in 2020 and initially failed the pain similar to that. She also had associated nausea but no vomiting or diarrhea. Patient otherwise denies any abdominal pain, focal weakness,speech difficulties  ED Course: [id number] systolic, heart rate in low 100, febrile 101.9, and also of up to 102.7.  Placement with normal CMP, that is 11.1, troponin XV, 17 and lactic acid 1.4, UA was consistent with UTI.  Patient was given ceftriaxone 1 g and she was about to be discharged when nursing noticed that she was somewhat dyspneic and needing oxygen confirmed with ABG where PO2 was 51, and underwent CTA that showed submassive PE and admission was requested.  Review of Systems: All systems were reviewed and were negative except as mentioned in HPI above. Negative for sore throat Negative for headache Negative for focal weakness  Past Medical History:  Diagnosis Date  . Anxiety   . Aortic stenosis   . Arthritis   . Breast cancer [facility])    CANCER  . Diabetes mellitus    Type II  . Dyspnea   . Heart murmur   . History of chemotherapy   . History of kidney stones   . Hypertension   . Mitral stenosis   . Morbid obesity (HCC)    BMI > 40  . Ovarian cancer ([place])   . Presence of permanent cardiac pacemaker     St. Jude  . Pulmonary embolism ([place]) 2013   post op  . 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 Spinal stenosis     Past Surgical History:  Procedure Laterality Date  . ABDOMINAL HYSTERECTOMY  2013  . BREAST SURGERY  03-2004   lumpectomy   . CARDIAC CATHETERIZATION    . INSERT / REPLACE / REMOVE PACEMAKER    . INTRAOPERATIVE TRANSTHORACIC ECHOCARDIOGRAM  07/17/2017   Procedure: INTRAOPERATIVE TRANSTHORACIC ECHOCARDIOGRAM;  Surgeon: Emily Mocha, MD;  Location: [place];  Service: Open Heart Surgery;;  . LAPAROTOMY N/[name] 03/22/2018   Procedure: grahams pouch, repair of bowel perforation;  Surgeon: Kinsinger, Arta Bruce, MD;  Location: [place];  Service: General;  Laterality: N/[name];  . LITHOTRIPSY    . PACEMAKER IMPLANT N/[name] 04/21/2016   Procedure: Pacemaker Implant;  Surgeon: Emily Meredith Leeds, MD;  Location: [place] CV LAB;  Service: Cardiovascular;  Laterality: N/[name];  . TONSILLECTOMY    . TRANSCATHETER AORTIC VALVE REPLACEMENT, TRANSFEMORAL N/[name] 07/17/2017   Procedure: TRANSCATHETER AORTIC VALVE REPLACEMENT, TRANSFEMORAL;  Surgeon: Emily Mocha, MD;  Location: [place];  Service: Open Heart Surgery;  Laterality: N/[name];     reports that she has quit smoking. Her smoking use included cigarettes. She has [name] 5.00 pack-year smoking history. She has never used smokeless tobacco. She reports that she does not drink alcohol or  use drugs.  Allergies  Allergen Reactions  . Nsaids Anaphylaxis  . Tape Rash    CLEAR PLASTIC TAPE    Family History  Problem Relation Age of Onset  . Bladder Cancer Father   . Cancer Brother   . Cancer Sister   . Heart failure Mother      Prior to Admission medications   Medication Sig Start Date End Date Taking? Authorizing Provider  atorvastatin (LIPITOR) 20 MG tablet Take 20 mg by mouth at bedtime.    Yes [provider]    Calcium Citrate-Vitamin D (CALCIUM CITRATE + PO) Take 1 tablet by mouth 2 (two) times daily.    Yes [provider]  cholecalciferol (VITAMIN D) 1000 units tablet Take 1,000 Units by mouth 2 (two) times daily.    Yes [provider]  fish oil-omega-3 fatty acids 1000 MG capsule Take 1 g by mouth every evening.    Yes [provider]  FLUoxetine (PROZAC) 40 MG capsule Take 40 mg by mouth daily.    Yes [provider]  furosemide (LASIX) 40 MG tablet Take 40 mg (1 tab) by mouth twice daily every Mon., Wed, Fri., and Sun. Take 40 mg once daily on Tues., Thurs., and Sat. 07/03/19  Yes Cleaver, Jossie Ng, NP  LEVEMIR FLEXTOUCH 100 UNIT/ML Pen Inject 60-70 Units into the skin 2 (two) times daily. Per sliding scale 03/20/16  Yes [provider]  losartan (COZAAR) 50 MG tablet Take 50 mg by mouth daily.   Yes [provider]  Magnesium 400 MG CAPS Take 400 mg by mouth at bedtime.    Yes [provider]  metFORMIN (GLUCOPHAGE) 1000 MG tablet Take 1,000 mg by mouth 2 (two) times daily with [name] meal.    Yes [provider]  metoprolol tartrate (LOPRESSOR) 25 MG tablet TAKE 1 TABLET(25 MG) BY MOUTH TWICE DAILY Patient taking differently: Take 25 mg by mouth 2 (two) times daily.  07/10/18  Yes Emily Mocha, MD  Multiple Vitamin (MULTIVITAMIN) tablet Take 1 tablet by mouth daily.     Yes [provider]  saccharomyces boulardii (FLORASTOR) 250 MG capsule Take 1 capsule (250 mg total) by mouth 2 (two) times daily. You can find [name] probiotic over the counter. Take this while you are on antibiotics. 03/28/18  Yes Meuth, Emily A, PA-C  cephALEXin (KEFLEX) 500 MG capsule Take 1 capsule (500 mg total) by mouth 2 (two) times daily. 07/18/19   [name], John, MD  pantoprazole (PROTONIX) 40 MG tablet Take 1 tablet (40 mg total) by mouth 2 (two) times daily. 07/18/19   [name], Emily Reichmann, MD    Physical Exam: Vitals:   07/18/19 1315 07/18/19 1330 07/18/19  1345 07/18/19 1423  BP: (!) 128/57 129/78 (!) 107/55   Pulse: (!) 106 (!) 107 (!) 108   Resp: (!) 28 19 (!) 21   Temp:    (!) 101.3 F (38.5 C)  TempSrc:      SpO2: 92% 96% 96%     General exam: AAOx3, weak, obese, shaky, weak appearing. HEENT:Oral mucosa moist, Ear/Nose WNL grossly, dentition normal. Respiratory system: bilaterally clear,no wheezing or crackles,no use of accessory muscle Cardiovascular system: S1 & S2 +, No JVD,. Gastrointestinal system: Abdomen soft, NT,ND, BS+ Nervous System:Alert, awake, moving extremities and grossly nonfocal Extremities: No edema, distal peripheral pulses palpable.  Skin: No rashes,no icterus. MSK: Normal muscle bulk,tone, power   Foley Catheter:  Labs on Admission: I have personally reviewed following labs and imaging studies  CBC: Recent Labs  Lab 07/18/19 0122  WBC 11.1*  NEUTROABS 8.6*  HGB 13.9  HCT 41.6  MCV 95.2  PLT [id number]   Basic Metabolic Panel: Recent Labs  Lab 07/18/19 0122  NA 135  K 4.3  CL 98  CO2 25  GLUCOSE 210*  BUN 21  CREATININE 0.97  CALCIUM 8.9   GFR: CrCl cannot be calculated (Unknown ideal weight.). Liver Function Tests: Recent Labs  Lab 07/18/19 0122  AST 18  ALT 16  ALKPHOS 68  BILITOT 1.2  PROT 7.6  ALBUMIN 4.1   Recent Labs  Lab 07/18/19 0122  LIPASE 21   No results for input(s): AMMONIA in the last 168 hours. Coagulation Profile: Recent Labs  Lab 07/18/19 1258  INR 1.3*   Cardiac Enzymes: No results for input(s): CKTOTAL, CKMB, CKMBINDEX, TROPONINI in the last 168 hours. BNP (last 3 results) No results for input(s): PROBNP in the last 8760 hours. HbA1C: No results for input(s): HGBA1C in the last 72 hours. CBG: No results for input(s): GLUCAP in the last 168 hours. Lipid Profile: No results for input(s): CHOL, HDL, LDLCALC, TRIG, CHOLHDL, LDLDIRECT in the last 72 hours. Thyroid Function Tests: No results for input(s): TSH, T4TOTAL, FREET4, T3FREE, THYROIDAB in the last  72 hours. Anemia Panel: No results for input(s): VITAMINB12, FOLATE, FERRITIN, TIBC, IRON, RETICCTPCT in the last 72 hours. Urine analysis:    Component Value Date/Time   COLORURINE YELLOW 07/18/2019 0542   APPEARANCEUR CLOUDY ([name]) 07/18/2019 0542   LABSPEC 1.014 07/18/2019 0542   PHURINE 5.0 07/18/2019 0542   GLUCOSEU NEGATIVE 07/18/2019 0542   HGBUR MODERATE ([name]) 07/18/2019 0542   BILIRUBINUR NEGATIVE 07/18/2019 0542   KETONESUR 20 ([name]) 07/18/2019 0542   PROTEINUR 100 ([name]) 07/18/2019 0542   UROBILINOGEN 0.2 12/28/2007 0222   NITRITE POSITIVE ([name]) 07/18/2019 0542   LEUKOCYTESUR LARGE ([name]) 07/18/2019 0542    Radiological Exams on Admission: DG Chest 2 View  Result Date: 07/18/2019 CLINICAL DATA:  Fever and vomiting EXAM: CHEST - 2 VIEW COMPARISON:  03/22/2018 FINDINGS: Mild cardiomegaly. Unchanged position of left chest wall pacemaker. No focal airspace consolidation, pleural effusion or pulmonary edema. IMPRESSION: No active cardiopulmonary disease. Electronically Signed   By: Ulyses Jarred M.D.   On: 07/18/2019 02:09   CT Angio Chest PE W/Cm &/Or Wo Cm  Result Date: 07/18/2019 CLINICAL DATA:  Shortness of breath EXAM: CT ANGIOGRAPHY CHEST WITH CONTRAST TECHNIQUE: Multidetector CT imaging of the chest was performed using the standard protocol during bolus administration of intravenous contrast. Multiplanar CT image reconstructions and MIPs were obtained to evaluate the vascular anatomy. CONTRAST:  137mL OMNIPAQUE IOHEXOL 350 MG/ML SOLN COMPARISON:  Chest radiograph Jul 18, 2019; CT angiogram chest June 20, 2017; CT angiogram chest April 12, 2016 FINDINGS: Cardiovascular: There are multiple pulmonary emboli throughout both lower lobes as well as in the right upper lobe. Pulmonary embolus is seen in the distal most aspect of the left main pulmonary artery. The right ventricle to left ventricle diameter ratio is 1.4, indicative of right heart strain. There is no thoracic aortic aneurysm or  dissection. There are scattered foci of calcification in visualized great vessels. There is aortic atherosclerosis. Pacemaker leads are attached to the right atrium and right ventricle. There is extensive mitral annulus calcification. There is [name] prosthetic aortic valve. No pericardial effusion or pericardial thickening evident. Mediastinum/Nodes: There is [name] stable mass in the left thyroid containing calcification measuring 2.1 x 2.0 cm. [name] calcification along the inferior aspect of  the left lobe of the thyroid measures 1.2 x 1.1 cm, stable. No new thyroid lesion evident. No evident thoracic adenopathy. No esophageal lesions are evident. Lungs/Pleura: There is mild bibasilar atelectasis. There is no appreciable edema or airspace opacity. No pleural effusions are evident. Upper Abdomen: There is hepatic steatosis. There is [name] cyst arising from the left kidney, incompletely visualized, measuring 6.5 x 6.0 cm. Visualized upper abdominal structures otherwise are unremarkable. Musculoskeletal: There is degenerative change in the thoracic spine. No blastic or lytic bone lesions. No chest wall lesions. Review of the MIP images confirms the above findings. IMPRESSION: 1. Extensive pulmonary embolus, more severe on the right than on the left, with right heart strain. Positive for acute PE with CTevidence of right heart strain (RV/LV Ratio = 1.4) consistent with at least submassive (intermediate risk) PE. The presence of right heart strain has been associated with an increased risk of morbidity and mortality. 2. Aortic atherosclerosis. No aneurysm or dissection evident. Status post aortic valve replacement. Pacemaker leads attached to right atrium and right ventricle. 3.  Bibasilar atelectasis.  No airspace consolidation. 4.  No adenopathy. 5. Stable left lobe thyroid nodule. In the setting of significant comorbidities or limited life expectancy, no follow-up recommended. Clinical assessment with respect to potential further  evaluation of this nodule advised. (Ref: J Am Coll Radiol. 2015 Feb;12(2): 143-50). 6.  Hepatic steatosis. Aortic Atherosclerosis (ICD10-I70.0). Critical Value/emergent results were called by telephone at the time of interpretation on 07/18/2019 at 11:30 am to provider Lacretia Leigh , who verbally acknowledged these results. Electronically Signed   By: Lowella Grip III M.D.   On: 07/18/2019 11:31    Assessment/Plan  Extensive pulmonary embolus more severe in the right with right heart strain evidenced in the CT RV/LV ratio 1.4: Unclear etiology but patient is sedentary barely mobile, has remote history of breast and ovarian cancer patient. COVID 19 is negative and had both covid vaccines last dose in march. She Emily be admitted to stepdown unit, seen by pulmonary critical care no indication for lytics at this time and Emily monitor closely on IV heparin infusion, obtain echocardiogram.  Troponin x2 are negative, lactic acid normal reassuring.  Currently hemodynamically stable with [name] stable blood pressure.  At risk of decompensating monitor.  Duplex of the leg.  Acute hypoxic respiratory failure needing supplemental oxygen went up to 5 L and being weaned down to 3 L.  This is due to PE.  Continue supplemental oxygen and wean as tolerated.  UTI patient febrile in the ED COVID-19 negative, continue Rocephin follow-up urine culture.  Having fever I think this is also from her PE continue supportive measures as needed Tylenol  [id number] Lipitor  Diabetes mellitus on long-term insulin and Metformin.  Hold p.o. meds.  Add sliding scale insulin, Levemir at lower dose and monitor. LastA1c stable 6.6 in 2019  Hypertension: Blood pressure stable in the setting of submassive PE hold her metoprolol and losartan.  Leg edema chronic: We Emily resume Lasix at lower dose 40 mg daily hopefully can resume at home dose  Status post TAVR in 2019, PPM  history of ovarian cancer status post TSH BSO in 2012 also  had [place] and Taxol chemo, breast cancer-status post lumpectomy in 2005  Goals of care: Discussed about CODE STATUS patient sister remain DNR does not wish to receive CPR ventilator daughter at the bedside in agreement.  Monitor closely in stepdown unit, she is at risk of decompensation, pulmonary critical care has been consulted.  There is no height or weight on file to calculate BMI.   Severity of Illness: * I certify that at the point of admission it is my clinical judgment that the patient Emily require inpatient hospital care spanning beyond 2 midnights from the point of admission due to high intensity of service, high risk for further deterioration and high frequency of surveillance required due to her submassive PE, hypoxia.    DVT prophylaxis: heparin Code Status:DNR Family Communication: Admission, patients condition and plan of care including tests being ordered have been discussed with the patient and her daughter who indicate understanding and agree with the plan and Code Status.  Consults called:   Antonieta Pert MD [facility]  If 7PM-7AM, please contact night-coverage [email or web address]  07/18/2019, 2:27 PM

## 2019-07-18 NOTE — Consult Note (Signed)
NAME:  [name], MRN:  [id number], DOB:  1936-03-06, LOS: 0 ADMISSION DATE:  07/18/2019, CONSULTATION DATE:  5/14 REFERRING MD:  Zenia Resides, CHIEF COMPLAINT:  Submassive PE    Brief History   84 year old female initially presented with working diagnosis of urinary tract infection on 5/14.  Also was complaining of shortness of breath.  A CT of chest was obtained which indeed demonstrated Extensive pulmonary embolus right greater than left with RV/LV ratio of 1.4 consistent with submassive pulmonary emboli.  Because of this pulmonary was asked to evaluate.  Modified PESI score: 4   History of present illness   This is an 84 year old female who presented to the emergency room on 5/14 right after midnight with chief complaint of abdominal discomfort for approximately 12 hours with associated nausea.  But no vomiting or diarrhea.  She had had previous perforated ulcer which she had felt similar discomfort with.  Because of this discomfort she came to the emergency room for evaluation.  On presentation her temperature was 101.9.  She is not on chronic oxygen at home.  ER evaluation included urinalysis which suggested urinary tract infection, mildly elevated CBC, hyperglycemia.  She was to be treated for urinary tract infection.  She was to be discharged and her supplemental oxygen was removed.  Pulse oximeter was noted to be 86 to 91% because the patient did have some complaint of new exertional dyspnea a CT of chest was obtained, this was an indeed positive for pulmonary emboli with evidence of right heart strain.  Pulmonary was asked to see in regards to submassive pulmonary emboli.  Past Medical History  HFpEF, aortic stenosis, prior breast cancer (left breast, diagnosed November 2005 status post lumpectomy, radiation and chemo) diabetes type 2, mitral stenosis, morbid obesity with BMI over 40, ovarian cancer status post TSH BSO 2012 also treated with [place] and Taxol, cardiac pacemaker, prior pulmonary  emboli following surgery, history of DVT transcatheter aortic valve replacement, spinal stenosis, cardiac pacemaker Prior ruptured duodenal ulcer  ECOG score 2 Significant Hospital Events   5/14 admitted w/ UTI-->incidentally found to be hypoxic   Consults:  pulm consulted  Procedures:    Significant Diagnostic Tests:  CT angio 5/14: 1. Extensive pulmonary embolus, more severe on the right than on the left, with right heart strain. Positive for acute PE with CTevidence of right heart strain (RV/LV Ratio = 1.4) consistent with at least submassive (intermediate risk) PE. The presence of right heart strain has been associated with an increased risk of morbidity and mortality. 2. Aortic atherosclerosis. No aneurysm or dissection evident. Status post aortic valve replacement. Pacemaker leads attached to right atrium and right ventricle. 3.  Bibasilar atelectasis.  No airspace consolidation. 4.  No adenopathy. 5. Stable left lobe thyroid nodule. In the setting of significant comorbidities or limited life expectancy, no follow-up recommended. Clinical assessment with respect to potential further evaluation of this nodule advised. (Ref: J Am Coll Radiol. 2015 Feb;12(2): 143-50). 6.  Hepatic steatosis. Aortic Atherosclerosis (ICD10-I70.0).  ECHO 5/14>>> LE [place] 5/14>>> Micro Data:  UC 5/14>>>  Antimicrobials:  Rocephin 5/14>>>  Interim history/subjective:  Complains of back pain and intermittent nausea   Objective   Blood pressure (Abnormal) 155/80, pulse (Abnormal) 110, temperature (Abnormal) 102.7 F (39.3 C), temperature source Oral, resp. rate 17, SpO2 95 %.       No intake or output data in the 24 hours ending 07/18/19 1304 There were no vitals filed for this visit.  Examination: General: obese white female.  Resting in bed. Frequently repositioning due to back pain HENT: NCAT no JVD MMM sclera not icteric  Lungs: clear no accessory use 3 lpm [place] Cardiovascular: RRR w/out  MRG Abdomen: soft c/o more flank than abd pain  Extremities: LE edema does have TEDS stockings in place  Neuro: awake and oriented  GU: voids   Resolved Hospital Problem list     Assessment & Plan:   Submassive PE w/ evidence RV strain UTI H/o breast and uterine cancer (both not active but being monitored) HFpEF Sinus tachycardia  MO Arthritis and spinal stenosis HTN  Diabetes type II  Submassive Unprovoked Pulmonary emboli w/ evidence of RV strain by RV/LV ratio  -this is her second thromboembolic event.  -PESI score 4 so she is high risk. That being said at this point her symptom burden is more related to her UTI and back discomfort from the stretcher than from her PE. That being said this could change rapidly.  Plan Cont IV heparin (she will need life long AC as this is second thromboembolic event) Get ECHO to eval for RV dysfxn Cycle CEs (the fact that her Lactic acid and trop I are negative is reassuring) Get LE [place] Admit to step down ->if declines would consider catheter directed TPA   All other rx per IM   Erick Colace ACNP-BC [place] Pager # [phone] OR # [phone] if no answer   Best practice:  Per primary   Labs   CBC: Recent Labs  Lab 07/18/19 0122  WBC 11.1*  NEUTROABS 8.6*  HGB 13.9  HCT 41.6  MCV 95.2  PLT [id number]    Basic Metabolic Panel: Recent Labs  Lab 07/18/19 0122  NA 135  K 4.3  CL 98  CO2 25  GLUCOSE 210*  BUN 21  CREATININE 0.97  CALCIUM 8.9   GFR: CrCl cannot be calculated (Unknown ideal weight.). Recent Labs  Lab 07/18/19 0122 07/18/19 0455  WBC 11.1*  --   LATICACIDVEN  --  1.4    Liver Function Tests: Recent Labs  Lab 07/18/19 0122  AST 18  ALT 16  ALKPHOS 68  BILITOT 1.2  PROT 7.6  ALBUMIN 4.1   Recent Labs  Lab 07/18/19 0122  LIPASE 21   No results for input(s): AMMONIA in the last 168 hours.  ABG    Component Value Date/Time   PHART 7.407 07/18/2019 0830   PCO2ART  41.8 07/18/2019 0830   PO2ART 51.1 (L) 07/18/2019 0830   HCO3 25.8 07/18/2019 0830   TCO2 29 07/17/2017 1228   ACIDBASEDEF 1.0 06/13/2017 0856   O2SAT 84.7 07/18/2019 0830     Coagulation Profile: No results for input(s): INR, PROTIME in the last 168 hours.  Cardiac Enzymes: No results for input(s): CKTOTAL, CKMB, CKMBINDEX, TROPONINI in the last 168 hours.  HbA1C: Hgb A1c MFr Bld  Date/Time Value Ref Range Status  07/13/2017 09:58 AM 6.6 (H) 4.8 - 5.6 % Final    Comment:    (NOTE) Pre diabetes:          5.7%-6.4% Diabetes:              >6.4% Glycemic control for   <7.0% adults with diabetes     CBG: No results for input(s): GLUCAP in the last 168 hours.  Review of Systems:   Review of Systems  Constitutional: Positive for chills, fever and malaise/fatigue.  HENT: Negative.   Eyes: Negative.   Respiratory: Positive for shortness of breath.  Cardiovascular: Positive for leg swelling.  Gastrointestinal: Positive for abdominal pain and nausea.  Genitourinary: Positive for dysuria and flank pain.  Musculoskeletal: Positive for back pain.  Neurological: Negative.   Endo/Heme/Allergies: Negative.   Psychiatric/Behavioral: Negative.      Past Medical History  She,  has a past medical history of Anxiety, Aortic stenosis, Arthritis, Breast cancer ([place]), Diabetes mellitus, Dyspnea, Heart murmur, History of chemotherapy, History of kidney stones, Hypertension, Mitral stenosis, Morbid obesity ([place]), Ovarian cancer ([place]), Presence of permanent cardiac pacemaker, Pulmonary embolism ([place]) (2013), S/P TAVR (transcatheter aortic valve replacement) (07/17/2017), and Spinal stenosis.   Surgical History    Past Surgical History:  Procedure Laterality Date  . ABDOMINAL HYSTERECTOMY  2013  . BREAST SURGERY  03-2004   lumpectomy   . CARDIAC CATHETERIZATION    . INSERT / REPLACE / REMOVE PACEMAKER    . INTRAOPERATIVE TRANSTHORACIC ECHOCARDIOGRAM  07/17/2017   Procedure:  INTRAOPERATIVE TRANSTHORACIC ECHOCARDIOGRAM;  Surgeon: Sherren Mocha, MD;  Location: [facility];  Service: Open Heart Surgery;;  . LAPAROTOMY N/A 03/22/2018   Procedure: grahams pouch, repair of bowel perforation;  Surgeon: Kinsinger, Arta Bruce, MD;  Location: [facility];  Service: General;  Laterality: N/A;  . LITHOTRIPSY    . PACEMAKER IMPLANT N/A 04/21/2016   Procedure: Pacemaker Implant;  Surgeon: Will Meredith Leeds, MD;  Location: [facility];  Service: Cardiovascular;  Laterality: N/A;  . TONSILLECTOMY    . TRANSCATHETER AORTIC VALVE REPLACEMENT, TRANSFEMORAL N/A 07/17/2017   Procedure: TRANSCATHETER AORTIC VALVE REPLACEMENT, TRANSFEMORAL;  Surgeon: Sherren Mocha, MD;  Location: [facility];  Service: Open Heart Surgery;  Laterality: N/A;     Social History   reports that she has quit smoking. Her smoking use included cigarettes. She has a 5.00 pack-year smoking history. She has never used smokeless tobacco. She reports that she does not drink alcohol or use drugs.   Family History   Her family history includes Bladder Cancer in her father; Cancer in her brother and sister; Heart failure in her mother.   Allergies Allergies  Allergen Reactions  . Nsaids Anaphylaxis  . Tape Rash    CLEAR PLASTIC TAPE     Home Medications  Prior to Admission medications   Medication Sig Start Date End Date Taking? Authorizing Provider  atorvastatin (LIPITOR) 20 MG tablet Take 20 mg by mouth at bedtime.    Yes [provider]  Calcium Citrate-Vitamin D (CALCIUM CITRATE + PO) Take 1 tablet by mouth 2 (two) times daily.    Yes [provider]  cholecalciferol (VITAMIN D) 1000 units tablet Take 1,000 Units by mouth 2 (two) times daily.    Yes [provider]  fish oil-omega-3 fatty acids 1000 MG capsule Take 1 g by mouth every evening.    Yes [provider]  FLUoxetine (PROZAC) 40 MG capsule Take 40 mg by mouth daily.    Yes [provider]  furosemide (LASIX)  40 MG tablet Take 40 mg (1 tab) by mouth twice daily every Mon., Wed, Fri., and Sun. Take 40 mg once daily on Tues., Thurs., and Sat. 07/03/19  Yes Cleaver, Jossie Ng, NP  LEVEMIR FLEXTOUCH 100 UNIT/ML Pen Inject 60-70 Units into the skin 2 (two) times daily. Per sliding scale 03/20/16  Yes [provider]  losartan (COZAAR) 50 MG tablet Take 50 mg by mouth daily.   Yes [provider]  Magnesium 400 MG CAPS Take 400 mg by mouth at bedtime.    Yes [provider]  metFORMIN (GLUCOPHAGE) 1000 MG tablet Take 1,000 mg by mouth 2 (two) times daily with a meal.    Yes [provider]  metoprolol tartrate (LOPRESSOR) 25 MG tablet TAKE 1 TABLET(25 MG) BY MOUTH TWICE DAILY Patient taking differently: Take 25 mg by mouth 2 (two) times daily.  07/10/18  Yes Sherren Mocha, MD  Multiple Vitamin (MULTIVITAMIN) tablet Take 1 tablet by mouth daily.     Yes [provider]  saccharomyces boulardii (FLORASTOR) 250 MG capsule Take 1 capsule (250 mg total) by mouth 2 (two) times daily. You can find a probiotic over the counter. Take this while you are on antibiotics. 03/28/18  Yes Meuth, Brooke A, PA-C  cephALEXin (KEFLEX) 500 MG capsule Take 1 capsule (500 mg total) by mouth 2 (two) times daily. 07/18/19   Molpus, John, MD  pantoprazole (PROTONIX) 40 MG tablet Take 1 tablet (40 mg total) by mouth 2 (two) times daily. 07/18/19   Molpus, Jenny Reichmann, MD     Critical care time: [place] ACNP-BC [place] Pager # [phone] OR # [phone] if no answer

## 2019-07-18 NOTE — ED Notes (Signed)
Pt sitting up on the side of the bed. NAD noted. Pt denies any  Needs. Will continue to monitor.

## 2019-07-18 NOTE — ED Notes (Signed)
Assisted pt up to the bathroom via wheelchair. Will continue to monitor.

## 2019-07-18 NOTE — ED Triage Notes (Signed)
Pt from home c/o epigastric pain onset last PM c/o nausea denies vomiting. Pt also denies diarrhea or constipation.  Hx gastric ulcers

## 2019-07-18 NOTE — ED Notes (Signed)
Patient has orders to discharge. Awaiting antibiotic to finish infusing.

## 2019-07-18 NOTE — ED Notes (Signed)
[place], MD made aware patient still has a temp of 101.3 after receiving tylenol.  No new orders.

## 2019-07-18 NOTE — Progress Notes (Signed)
ANTICOAGULATION CONSULT NOTE - Initial Consult  Pharmacy Consult for Heparin Indication: pulmonary embolus  Allergies  Allergen Reactions  . Nsaids Anaphylaxis  . Tape Rash    CLEAR PLASTIC TAPE    Patient Measurements:   Heparin Dosing Weight: 91.6 kg  Vital Signs: Temp: 99.7 F (37.6 C) (05/14 0859) Temp Source: Oral (05/14 0859) BP: 136/69 (05/14 1045) Pulse Rate: 89 (05/14 1045)  Labs: Recent Labs    07/18/19 0122  HGB 13.9  HCT 41.6  PLT 171  CREATININE 0.97  TROPONINIHS 15    CrCl cannot be calculated (Unknown ideal weight.).   Medical History: Past Medical History:  Diagnosis Date  . Anxiety   . Aortic stenosis   . Arthritis   . Breast cancer [facility])    CANCER  . Diabetes mellitus    Type II  . Dyspnea   . Heart murmur   . History of chemotherapy   . History of kidney stones   . Hypertension   . Mitral stenosis   . Morbid obesity ([facility])    BMI > 40  . Ovarian cancer ([place])   . Presence of permanent cardiac pacemaker    [employer]  . Pulmonary embolism ([place]) 2013   post op  . 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 Spinal stenosis     Medications:  (Not in a hospital admission)  Assessment: 84 y/o F with a h/o PE and TAVR not on chronic anticoagulation initially d/c from ED after treatment for UTI noted to be dyspneic and found to have PE with right heart strain.    Goal of Therapy:  Heparin level 0.3-0.7 units/ml Monitor platelets by anticoagulation protocol: Yes   Plan:  Give 3500 units bolus x 1 Start heparin infusion at 1850 units/hr Check anti-Xa level in 8 hours and daily while on heparin Continue to monitor H&H and platelets  Ulice Dash D 07/18/2019,11:50 AM

## 2019-07-18 NOTE — ED Notes (Signed)
Pt lying in bed awake. o2 applied. Pt Denies any needs. Will continue to monitor.

## 2019-07-18 NOTE — ED Notes (Signed)
Pt lying in bed awake. Denies any needs. Will continue to monitor pt.

## 2019-07-18 NOTE — ED Notes (Addendum)
Pt sitting up in bed awake.informed pt that we are waiting on urine sample. Denies any needs. Will continue to monitor.

## 2019-07-18 NOTE — ED Notes (Signed)
Patient was on oxygen this morning at the start of the shift, according to patient she does not use at home.  Oxygen removed.  After about 10-15 minute patient O2 sats are anywhere from 86-91%.  Patient denies SOB but niece at bedside states that patient was c/o SOB yesterday when EMS picked her [place] and brought to the hospital.  Dr. Zenia Resides made aware and order received for ABG.  Respiratory made aware.

## 2019-07-18 NOTE — ED Notes (Signed)
Patient back from CT.

## 2019-07-18 NOTE — ED Provider Notes (Signed)
[facility] DEPT Provider Note: Georgena Spurling, MD, FACEP  CSN: [id number] MRN: [id number] ARRIVAL: 07/18/19 at [place]: [place]  Abdominal Pain   HISTORY OF PRESENT ILLNESS  07/18/19 1:11 AM [name] is a 84 y.o. female with epigastric "discomfort" that began the evening before yesterday.  She describes it as a discomfort and not a frank pain.  It is worse when lying supine and better when sitting upright.  She describes it as feeling like when she had a perforated duodenal ulcer several years ago.  She has had associated nausea but no vomiting or or diarrhea.  She has had no constipation or dysuria.  She has had an occasional cough but no shortness of breath.  She is not aware of having a fever but her temperature on arrival was 101.9.   Past Medical History:  Diagnosis Date  . Anxiety   . Aortic stenosis   . Arthritis   . Breast cancer [facility])    CANCER  . Diabetes mellitus    Type II  . Dyspnea   . Heart murmur   . History of chemotherapy   . History of kidney stones   . Hypertension   . Mitral stenosis   . Morbid obesity (HCC)    BMI > 40  . Ovarian cancer ([place])   . Presence of permanent cardiac pacemaker    St. Jude  . Pulmonary embolism ([place]) 2013   post op  . 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 Spinal stenosis     Past Surgical History:  Procedure Laterality Date  . ABDOMINAL HYSTERECTOMY  2013  . BREAST SURGERY  03-2004   lumpectomy   . CARDIAC CATHETERIZATION    . INSERT / REPLACE / REMOVE PACEMAKER    . INTRAOPERATIVE TRANSTHORACIC ECHOCARDIOGRAM  07/17/2017   Procedure: INTRAOPERATIVE TRANSTHORACIC ECHOCARDIOGRAM;  Surgeon: Sherren Mocha, MD;  Location: [place];  Service: Open Heart Surgery;;  . LAPAROTOMY N/A 03/22/2018   Procedure: grahams pouch, repair of bowel perforation;  Surgeon: Kinsinger,  Arta Bruce, MD;  Location: [place];  Service: General;  Laterality: N/A;  . LITHOTRIPSY    . PACEMAKER IMPLANT N/A 04/21/2016   Procedure: Pacemaker Implant;  Surgeon: Will Meredith Leeds, MD;  Location: [place] CV LAB;  Service: Cardiovascular;  Laterality: N/A;  . TONSILLECTOMY    . TRANSCATHETER AORTIC VALVE REPLACEMENT, TRANSFEMORAL N/A 07/17/2017   Procedure: TRANSCATHETER AORTIC VALVE REPLACEMENT, TRANSFEMORAL;  Surgeon: Sherren Mocha, MD;  Location: [place];  Service: Open Heart Surgery;  Laterality: N/A;    Family History  Problem Relation Age of Onset  . Bladder Cancer Father   . Cancer Brother   . Cancer Sister   . Heart failure Mother     Social History   Tobacco Use  . Smoking status: Former Smoker    Packs/day: 1.00    Years: 5.00    Pack years: 5.00    Types: Cigarettes  . Smokeless tobacco: Never Used  Substance Use Topics  . Alcohol use: No  . Drug use: No    Prior to Admission medications   Medication Sig Start Date End Date Taking? Authorizing Provider  acetaminophen (TYLENOL) 650 MG CR tablet Take 1,300 mg by mouth every 8 (eight) hours as needed for pain.   Yes [provider]  amoxicillin (AMOXIL) 500 MG tablet Take 4 tablets (2,000 mg  total) by mouth as directed. 1 hour prior to dental work including cleanings 06/25/18  Yes Eileen Stanford, PA-C  atorvastatin (LIPITOR) 20 MG tablet Take 20 mg by mouth at bedtime.    Yes [provider]  Calcium Citrate-Vitamin D (CALCIUM CITRATE + PO) Take 1 tablet by mouth 2 (two) times daily.    Yes [provider]  cholecalciferol (VITAMIN D) 1000 units tablet Take 1,000 Units by mouth 2 (two) times daily.    Yes [provider]  fish oil-omega-3 fatty acids 1000 MG capsule Take 1 g by mouth every evening.    Yes [provider]  FLUoxetine (PROZAC) 40 MG capsule Take 40 mg by mouth daily.    Yes [provider]  furosemide (LASIX) 40 MG tablet Take 40 mg (1 tab)  by mouth twice daily every Mon., Wed, Fri., and Sun. Take 40 mg once daily on Tues., Thurs., and Sat. 07/03/19  Yes Cleaver, Jossie Ng, NP  LEVEMIR FLEXTOUCH 100 UNIT/ML Pen Inject 60-70 Units into the skin 2 (two) times daily. Per sliding scale 03/20/16  Yes [provider]  losartan (COZAAR) 50 MG tablet Take 50 mg by mouth daily.   Yes [provider]  Magnesium 400 MG CAPS Take 400 mg by mouth at bedtime.    Yes [provider]  metFORMIN (GLUCOPHAGE) 1000 MG tablet Take 1,000 mg by mouth 2 (two) times daily with a meal.    Yes [provider]  metoprolol tartrate (LOPRESSOR) 25 MG tablet TAKE 1 TABLET(25 MG) BY MOUTH TWICE DAILY Patient taking differently: Take 25 mg by mouth 2 (two) times daily.  07/10/18  Yes Sherren Mocha, MD  Multiple Vitamin (MULTIVITAMIN) tablet Take 1 tablet by mouth daily.     Yes [provider]  pantoprazole (PROTONIX) 40 MG tablet Take 1 tablet (40 mg total) by mouth 2 (two) times daily. 03/28/18  Yes Meuth, Brooke A, PA-C  saccharomyces boulardii (FLORASTOR) 250 MG capsule Take 1 capsule (250 mg total) by mouth 2 (two) times daily. You can find a probiotic over the counter. Take this while you are on antibiotics. 03/28/18  Yes Meuth, Brooke A, PA-C    Allergies Nsaids and Tape   REVIEW OF SYSTEMS  Negative except as noted here or in the History of Present Illness.   PHYSICAL EXAMINATION  Initial Vital Signs Blood pressure 127/69, pulse 94, temperature (!) 101.9 F (38.8 C), temperature source Oral, resp. rate (!) 24, SpO2 91 %.  Examination General: Well-developed, well-nourished female in no acute distress; appearance consistent with age of record HENT: normocephalic; atraumatic Eyes: pupils equal, round and reactive to light; extraocular muscles intact Neck: supple Heart: regular rate and rhythm; no murmur; paced rhythm on monitor Lungs: clear to auscultation bilaterally Abdomen: soft; nondistended; subxiphoid  tenderness which reproduces the patient's "discomfort" but not pain; bowel sounds present Extremities: No deformity; full range of motion; edema of lower legs and feet which are in compression stockings Neurologic: Awake, alert and oriented; motor function intact in all extremities and symmetric; no facial droop Skin: Warm and dry Psychiatric: Normal mood and affect   RESULTS  Summary of this visit's results, reviewed and interpreted by myself:   EKG Interpretation  Date/Time:    Ventricular Rate:    PR Interval:    QRS Duration:   QT Interval:    QTC Calculation:   R Axis:     Text Interpretation:        Laboratory Studies: Results for  orders placed or performed during the hospital encounter of 07/18/19 (from the past 24 hour(s))  CBC with Differential/Platelet     Status: Abnormal   Collection Time: 07/18/19  1:22 AM  Result Value Ref Range   WBC 11.1 (H) 4.0 - 10.5 K/uL   RBC 4.37 3.87 - 5.11 MIL/uL   Hemoglobin 13.9 12.0 - 15.0 g/dL   HCT 41.6 36.0 - 46.0 %   MCV 95.2 80.0 - 100.0 fL   MCH 31.8 26.0 - 34.0 pg   MCHC 33.4 30.0 - 36.0 g/dL   RDW 14.1 11.5 - 15.5 %   Platelets 171 150 - 400 K/uL   nRBC 0.0 0.0 - 0.2 %   Neutrophils Relative % 77 %   Neutro Abs 8.6 (H) 1.7 - 7.7 K/uL   Lymphocytes Relative 12 %   Lymphs Abs 1.3 0.7 - 4.0 K/uL   Monocytes Relative 11 %   Monocytes Absolute 1.2 (H) 0.1 - 1.0 K/uL   Eosinophils Relative 0 %   Eosinophils Absolute 0.0 0.0 - 0.5 K/uL   Basophils Relative 0 %   Basophils Absolute 0.0 0.0 - 0.1 K/uL   Immature Granulocytes 0 %   Abs Immature Granulocytes 0.04 0.00 - 0.07 K/uL  Comprehensive metabolic panel     Status: Abnormal   Collection Time: 07/18/19  1:22 AM  Result Value Ref Range   Sodium 135 135 - 145 mmol/L   Potassium 4.3 3.5 - 5.1 mmol/L   Chloride 98 98 - 111 mmol/L   CO2 25 22 - 32 mmol/L   Glucose, Bld 210 (H) 70 - 99 mg/dL   BUN 21 8 - 23 mg/dL   Creatinine, Ser 0.97 0.44 - 1.00 mg/dL   Calcium 8.9  8.9 - 10.3 mg/dL   Total Protein 7.6 6.5 - 8.1 g/dL   Albumin 4.1 3.5 - 5.0 g/dL   AST 18 15 - 41 U/L   ALT 16 0 - 44 U/L   Alkaline Phosphatase 68 38 - 126 U/L   Total Bilirubin 1.2 0.3 - 1.2 mg/dL   GFR calc non Af Amer 54 (L) >60 mL/min   GFR calc Af Amer >60 >60 mL/min   Anion gap 12 5 - 15  Lipase, blood     Status: None   Collection Time: 07/18/19  1:22 AM  Result Value Ref Range   Lipase 21 11 - 51 U/L  Troponin I (High Sensitivity)     Status: None   Collection Time: 07/18/19  1:22 AM  Result Value Ref Range   Troponin I (High Sensitivity) 15 <18 ng/L  Blood culture (routine x 2)     Status: None (Preliminary result)   Collection Time: 07/18/19  1:28 AM   Specimen: BLOOD RIGHT FOREARM  Result Value Ref Range   Specimen Description BLOOD RIGHT FOREARM    Special Requests      BOTTLES DRAWN AEROBIC AND ANAEROBIC Blood Culture results may not be optimal due to an inadequate volume of blood received in culture bottles Performed at [facility], [place] [address] [place], [place] [address]    Culture PENDING    Report Status PENDING   Lactic acid, plasma     Status: None   Collection Time: 07/18/19  4:55 AM  Result Value Ref Range   Lactic Acid, Venous 1.4 0.5 - 1.9 mmol/L  Urinalysis, Routine w reflex microscopic     Status: Abnormal   Collection Time: 07/18/19  5:42 AM  Result Value  Ref Range   Color, Urine YELLOW YELLOW   APPearance CLOUDY (A) CLEAR   Specific Gravity, Urine 1.014 1.005 - 1.030   pH 5.0 5.0 - 8.0   Glucose, UA NEGATIVE NEGATIVE mg/dL   Hgb urine dipstick MODERATE (A) NEGATIVE   Bilirubin Urine NEGATIVE NEGATIVE   Ketones, ur 20 (A) NEGATIVE mg/dL   Protein, ur 100 (A) NEGATIVE mg/dL   Nitrite POSITIVE (A) NEGATIVE   Leukocytes,Ua LARGE (A) NEGATIVE   RBC / HPF 21-50 0 - 5 RBC/hpf   WBC, UA >50 (H) 0 - 5 WBC/hpf   Bacteria, UA FEW (A) NONE SEEN   Squamous Epithelial / LPF 0-5 0 - 5   WBC Clumps PRESENT    Mucus PRESENT     Imaging Studies: DG Chest 2 View  Result Date: 07/18/2019 CLINICAL DATA:  Fever and vomiting EXAM: CHEST - 2 VIEW COMPARISON:  03/22/2018 FINDINGS: Mild cardiomegaly. Unchanged position of left chest wall pacemaker. No focal airspace consolidation, pleural effusion or pulmonary edema. IMPRESSION: No active cardiopulmonary disease. Electronically Signed   By: Ulyses Jarred M.D.   On: 07/18/2019 02:09    ED COURSE and MDM  Nursing notes, initial and subsequent vitals signs, including pulse oximetry, reviewed and interpreted by myself.  Vitals:   07/18/19 0245 07/18/19 0330 07/18/19 0445 07/18/19 0449  BP: 135/65 138/66  136/66  Pulse: 88 87 88 93  Resp: (!) 22 (!) 22  18  Temp:      TempSrc:      SpO2: 100% 97% 98% 97%   Medications  cefTRIAXone (ROCEPHIN) 1 g in sodium chloride 0.9 % 100 mL IVPB (has no administration in time range)  ondansetron (ZOFRAN) injection 4 mg (4 mg Intravenous Given 07/18/19 0240)  pantoprazole (PROTONIX) injection 40 mg (40 mg Intravenous Given 07/18/19 0240)  acetaminophen (TYLENOL) tablet 650 mg (650 mg Oral Given 07/18/19 0240)   5:23 AM Patient's discomfort nearly abated after Zofran and Protonix.  Source of fever still not clear, urinalysis pending.  6:33 AM Urine sent for culture.  IV Rocephin given for urinary tract infection.  We will also start patient on a PPI for suspected peptic ulcer disease, gastritis and/or GERD.   PROCEDURES  Procedures   ED DIAGNOSES     ICD-10-CM   1. Lower urinary tract infectious disease  N39.0   2. Epigastric pain  R10.13        Khyler Eschmann, MD 07/18/19 [phone]

## 2019-07-18 NOTE — Progress Notes (Signed)
Pharmacy: [facility]  Patient is an 84 y/o F with a h/o PE and TAVR not on chronic anticoagulation, initially presented to the ED for treatment of UTI. At time of discharge she was noted to be SOB. Chest CTA showed "extensive pulmonary embolus" with right heart strain.  She's currently on heparin for VTE  - first heparin level is therapeutic at 0.57   Goal of Therapy:  Heparin level 0.3-0.7 units/ml Monitor platelets by anticoagulation protocol: Yes  Plan: - continue heparin drip at 1850 units/hr - recheck level at 5AM on 5/15 to ensure it's still therapeutic before changing to daily monitoring - monitor for s/sx bleeding  Dia Sitter, PharmD, BCPS 07/18/2019 11:15 PM

## 2019-07-18 NOTE — ED Notes (Signed)
Patient placed back on oxygen at 3L due to O2 sat dropping to 79% with exertion.  Patient awaiting CT.

## 2019-07-19 ENCOUNTER — Inpatient Hospital Stay (HOSPITAL_COMMUNITY): Payer: Medicare Other

## 2019-07-19 DIAGNOSIS — B9620 Unspecified Escherichia coli [E. coli] as the cause of diseases classified elsewhere: Secondary | ICD-10-CM

## 2019-07-19 DIAGNOSIS — N390 Urinary tract infection, site not specified: Secondary | ICD-10-CM

## 2019-07-19 DIAGNOSIS — J9621 Acute and chronic respiratory failure with hypoxia: Secondary | ICD-10-CM

## 2019-07-19 DIAGNOSIS — E785 Hyperlipidemia, unspecified: Secondary | ICD-10-CM

## 2019-07-19 DIAGNOSIS — I10 Essential (primary) hypertension: Secondary | ICD-10-CM

## 2019-07-19 DIAGNOSIS — J9601 Acute respiratory failure with hypoxia: Secondary | ICD-10-CM

## 2019-07-19 DIAGNOSIS — M7989 Other specified soft tissue disorders: Secondary | ICD-10-CM

## 2019-07-19 DIAGNOSIS — I2609 Other pulmonary embolism with acute cor pulmonale: Secondary | ICD-10-CM

## 2019-07-19 LAB — COMPREHENSIVE METABOLIC PANEL
ALT: 15 U/L (ref 0–44)
AST: 17 U/L (ref 15–41)
Albumin: 3.4 g/dL — ABNORMAL LOW (ref 3.5–5.0)
Alkaline Phosphatase: 55 U/L (ref 38–126)
Anion gap: 10 (ref 5–15)
BUN: 25 mg/dL — ABNORMAL HIGH (ref 8–23)
CO2: 24 mmol/L (ref 22–32)
Calcium: 8.3 mg/dL — ABNORMAL LOW (ref 8.9–10.3)
Chloride: 101 mmol/L (ref 98–111)
Creatinine, Ser: 1.17 mg/dL — ABNORMAL HIGH (ref 0.44–1.00)
GFR calc Af Amer: 50 mL/min — ABNORMAL LOW (ref >60)
GFR calc non Af Amer: 43 mL/min — ABNORMAL LOW (ref >60)
Glucose, Bld: 148 mg/dL — ABNORMAL HIGH (ref 70–99)
Potassium: 4 mmol/L (ref 3.5–5.1)
Sodium: 135 mmol/L (ref 135–145)
Total Bilirubin: 1 mg/dL (ref 0.3–1.2)
Total Protein: 6.6 g/dL (ref 6.5–8.1)

## 2019-07-19 LAB — CBC
HCT: 37.6 % (ref 36.0–46.0)
Hemoglobin: 11.9 g/dL — ABNORMAL LOW (ref 12.0–15.0)
MCH: 30.6 pg (ref 26.0–34.0)
MCHC: 31.6 g/dL (ref 30.0–36.0)
MCV: 96.7 fL (ref 80.0–100.0)
Platelets: 137 10*3/uL — ABNORMAL LOW (ref 150–400)
RBC: 3.89 MIL/uL (ref 3.87–5.11)
RDW: 14.1 % (ref 11.5–15.5)
WBC: 8.7 10*3/uL (ref 4.0–10.5)
nRBC: 0 % (ref 0.0–0.2)

## 2019-07-19 LAB — GLUCOSE, CAPILLARY
Glucose-Capillary: 153 mg/dL — ABNORMAL HIGH (ref 70–99)
Glucose-Capillary: 132 mg/dL — ABNORMAL HIGH (ref 70–99)
Glucose-Capillary: 126 mg/dL — ABNORMAL HIGH (ref 70–99)
Glucose-Capillary: 142 mg/dL — ABNORMAL HIGH (ref 70–99)
Glucose-Capillary: 124 mg/dL — ABNORMAL HIGH (ref 70–99)

## 2019-07-19 LAB — MRSA PCR SCREENING: MRSA by PCR: POSITIVE — AB

## 2019-07-19 LAB — HEPARIN LEVEL (UNFRACTIONATED)
Heparin Unfractionated: 0.64 IU/mL (ref 0.30–0.70)
Heparin Unfractionated: 0.73 IU/mL — ABNORMAL HIGH (ref 0.30–0.70)

## 2019-07-19 LAB — ECHOCARDIOGRAM COMPLETE

## 2019-07-19 MED ORDER — HEPARIN (PORCINE) 25000 UT/250ML-% IV SOLN
1800.0000 [IU]/h | INTRAVENOUS | Status: DC
  Administered 2019-07-19 (×2): 1800 [IU]/h via INTRAVENOUS
  Filled 2019-07-19: qty 250

## 2019-07-19 MED ORDER — HEPARIN (PORCINE) 25000 UT/250ML-% IV SOLN
1450.0000 [IU]/h | INTRAVENOUS | Status: DC
  Administered 2019-07-19 – 2019-07-21 (×3): 1450 [IU]/h via INTRAVENOUS
  Filled 2019-07-19 (×2): qty 250

## 2019-07-19 NOTE — Progress Notes (Signed)
NAME:  [name], MRN:  [id number], DOB:  31-Oct-1935, LOS: 1 ADMISSION DATE:  07/18/2019, CONSULTATION DATE:  5/14 REFERRING MD:  Zenia Resides, CHIEF COMPLAINT:  Submassive PE    Brief History   84 year old female initially presented with working diagnosis of urinary tract infection on 5/14.  Also was complaining of shortness of breath.  A CT of chest was obtained which indeed demonstrated Extensive pulmonary embolus right greater than left with RV/LV ratio of 1.4 consistent with submassive pulmonary emboli.  Because of this pulmonary was asked to evaluate.  Modified PESI score: 4   History of present illness   This is an 84 year old female who presented to the emergency room on 5/14 right after midnight with chief complaint of abdominal discomfort for approximately 12 hours with associated nausea.  But no vomiting or diarrhea.  She had had previous perforated ulcer which she had felt similar discomfort with.  Because of this discomfort she came to the emergency room for evaluation.  On presentation her temperature was 101.9.  She is not on chronic oxygen at home.  ER evaluation included urinalysis which suggested urinary tract infection, mildly elevated CBC, hyperglycemia.  She was to be treated for urinary tract infection.  She was to be discharged and her supplemental oxygen was removed.  Pulse oximeter was noted to be 86 to 91% because the patient did have some complaint of new exertional dyspnea a CT of chest was obtained, this was an indeed positive for pulmonary emboli with evidence of right heart strain.  Pulmonary was asked to see in regards to submassive pulmonary emboli.  Past Medical History  HFpEF, aortic stenosis, prior breast cancer (left breast, diagnosed November 2005 status post lumpectomy, radiation and chemo) diabetes type 2, mitral stenosis, morbid obesity with BMI over 40, ovarian cancer status post TSH BSO 2012 also treated with [place] and Taxol, cardiac pacemaker, prior pulmonary  emboli following surgery, history of DVT transcatheter aortic valve replacement, spinal stenosis, cardiac pacemaker Prior ruptured duodenal ulcer  ECOG score 2 Significant Hospital Events   5/14 admitted w/ UTI-->incidentally found to be hypoxic   Consults:  pulm   Procedures:    Significant Diagnostic Tests:  CT angio 5/14: 1. Extensive pulmonary embolus, more severe on the right than on the left, with right heart strain. Positive for acute PE with CTevidence of right heart strain (RV/LV Ratio = 1.4) consistent with at least submassive (intermediate risk) PE. The presence of right heart strain has been associated with an increased risk of morbidity and mortality. 2. Aortic atherosclerosis. No aneurysm or dissection evident. Status post aortic valve replacement. Pacemaker leads attached to right atrium and right ventricle. 3.  Bibasilar atelectasis.  No airspace consolidation. 4.  No adenopathy. 5. Stable left lobe thyroid nodule. In the setting of significant comorbidities or limited life expectancy, no follow-up recommended. Clinical assessment with respect to potential further evaluation of this nodule advised. (Ref: J Am Coll Radiol. 2015 Feb;12(2): 143-50). 6.  Hepatic steatosis. Aortic Atherosclerosis (ICD10-I70.0).  ECHO 5/14>>> LE [place] 5/14>>> Micro Data:  UC 5/14>>>  Antimicrobials:  Rocephin 5/14>>>  Interim history/subjective:  Headache x 2 days- she thinks this is due to stress, possible caffeine. SOB not worse. No CP.  No recent surgery or prolonged travel. No history of CNS bleeding ever, no recent CNS procedures. She had UGIB 2/2 PUD from NSAIDs causing duodenal perf in 03/2018.  Objective   Blood pressure (!) 121/48, pulse 88, temperature 98.4 F (36.9 C), temperature source Oral, resp.  rate 20, SpO2 94 %.        Intake/Output Summary (Last 24 hours) at 07/19/2019 0755 Last data filed at 07/19/2019 0600 Gross per 24 hour  Intake 347.15 ml  Output 1200 ml   Net -852.85 ml   There were no vitals filed for this visit.  Examination: General: obese elderly woman laying in bed in NAD. HENT: [place]/AT, eyes anicteric.  Lungs: breathing comfortably on [place], CTAB. Speaking in full sentences. Cardiovascular: irreg rhythm, paced on tele. No murmurs. Abdomen: soft, NT, ND Extremities: L>R LE edema, chronic venous stasis changes distal BLE. Neuro: awake, answering questions appropriately, face symmetric, answering questions appropriately   Resolved Hospital Problem list     Assessment & Plan:   Submassive PE w/ evidence RV strain UTI H/o breast and uterine cancer (both not active but being monitored) Chronic HFpEF obesity H/o UGIB, PUD 2/2 NSAIDs  Submassive Unprovoked Pulmonary emboli w/ evidence of RV strain by RV/LV ratio. This is her second event, although her previous DVT was during treatment for cancer/ provoked. PESI score 4 = high risk.  Plan -Cont IV heparin (she will need AC indefinitely).  Recommend discharge on Eliquis twice daily. -ECHO pending -LE dopplers pending -Discsussed risk, benefits, alternatives of TPA if she declines. Largest risk factor for bleeding complications for her is her age. Age is a relative contraindication rather than absolute for PE. She accepted treatment with TPA if she declines. Catheter-directed TPA would also be a potential option if she decompensates but remained stable enough for this option.  Bedside RN present throughout this discussion. -Needs age-appropriate cancer screening as an outpatient.  Recommend follow-up with her oncologist to determine if additional testing needs to be done given her history. -Continue to monitor in [place].   Best practice:  Per primary   Labs   CBC: Recent Labs  Lab 07/18/19 0122 07/19/19 0603  WBC 11.1* 8.7  NEUTROABS 8.6*  --   HGB 13.9 11.9*  HCT 41.6 37.6  MCV 95.2 96.7  PLT 171 137*    Basic Metabolic Panel: Recent Labs  Lab 07/18/19 0122 07/19/19 0603   NA 135 135  K 4.3 4.0  CL 98 101  CO2 25 24  GLUCOSE 210* 148*  BUN 21 25*  CREATININE 0.97 1.17*  CALCIUM 8.9 8.3*   GFR: CrCl cannot be calculated (Unknown ideal weight.). Recent Labs  Lab 07/18/19 0122 07/18/19 0455 07/19/19 0603  WBC 11.1*  --  8.7  LATICACIDVEN  --  1.4  --     Liver Function Tests: Recent Labs  Lab 07/18/19 0122 07/19/19 0603  AST 18 17  ALT 16 15  ALKPHOS 68 55  BILITOT 1.2 1.0  PROT 7.6 6.6  ALBUMIN 4.1 3.4*   Recent Labs  Lab 07/18/19 0122  LIPASE 21   No results for input(s): AMMONIA in the last 168 hours.  ABG    Component Value Date/Time   PHART 7.407 07/18/2019 0830   PCO2ART 41.8 07/18/2019 0830   PO2ART 51.1 (L) 07/18/2019 0830   HCO3 25.8 07/18/2019 0830   TCO2 29 07/17/2017 1228   ACIDBASEDEF 1.0 06/13/2017 0856   O2SAT 84.7 07/18/2019 0830     Coagulation Profile: Recent Labs  Lab 07/18/19 1258  INR 1.3*    Cardiac Enzymes: No results for input(s): CKTOTAL, CKMB, CKMBINDEX, TROPONINI in the last 168 hours.  HbA1C: Hgb A1c MFr Bld  Date/Time Value Ref Range Status  07/18/2019 04:27 PM 7.0 (H) 4.8 - 5.6 % Final  Comment:    (NOTE) Pre diabetes:          5.7%-6.4% Diabetes:              >6.4% Glycemic control for   <7.0% adults with diabetes   07/13/2017 09:58 AM 6.6 (H) 4.8 - 5.6 % Final    Comment:    (NOTE) Pre diabetes:          5.7%-6.4% Diabetes:              >6.4% Glycemic control for   <7.0% adults with diabetes     CBG: Recent Labs  Lab 07/18/19 1430 07/18/19 1619 07/18/19 2129  GLUCAP 183* 191* Caroline Azaleah Usman, DO 07/19/19 7:55 AM [facility]

## 2019-07-19 NOTE — Progress Notes (Signed)
[place] for Heparin Indication: pulmonary embolus  Allergies  Allergen Reactions  . Nsaids Anaphylaxis  . Tape Rash    CLEAR PLASTIC TAPE    Patient Measurements:   Heparin Dosing Weight: 91.6 kg  Vital Signs: Temp: 98.4 F (36.9 C) (05/15 0247) Temp Source: Oral (05/15 0247) BP: 121/48 (05/15 0400) Pulse Rate: 88 (05/15 0400)  Labs: Recent Labs    07/18/19 0122 07/18/19 1257 07/18/19 1258 07/18/19 2159 07/19/19 0603  HGB 13.9  --   --   --  11.9*  HCT 41.6  --   --   --  37.6  PLT 171  --   --   --  137*  APTT  --   --  126*  --   --   LABPROT  --   --  16.0*  --   --   INR  --   --  1.3*  --   --   HEPARINUNFRC  --   --   --  0.57 0.64  CREATININE 0.97  --   --   --  1.17*  TROPONINIHS 15 17  --   --   --     CrCl cannot be calculated (Unknown ideal weight.).   Medical History: Past Medical History:  Diagnosis Date  . Anxiety   . Aortic stenosis   . Arthritis   . Breast cancer [facility])    CANCER  . Diabetes mellitus    Type II  . Dyspnea   . Heart murmur   . History of chemotherapy   . History of kidney stones   . Hypertension   . Mitral stenosis   . Morbid obesity (HCC)    BMI > 40  . Ovarian cancer ([place])   . Presence of permanent cardiac pacemaker    St. Jude  . Pulmonary embolism ([place]) 2013   post op  . 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 Spinal stenosis     Assessment: 84 y/o F with a h/o PE and TAVR not on chronic anticoagulation initially d/c from ED after treatment for UTI noted to be dyspneic and found to have PE with right heart strain.  Heparin started with 3500 unit load, 1850 units/hr, first level 0.57 units/ml, in therapeutic range  Goal of Therapy:  Heparin level 0.3-0.7 units/ml Monitor platelets by anticoagulation protocol: Yes   Today, 07/19/2019 2nd  Hep level 0600 = 0.64 units/ml, on 1800 units/hr, level slightly increased   Plan:  Reduce Heparin infusion to 1800 units/hr Recheck Hep level at 1800 Daily CBC, Heparin level Follow up for oral anti-coagulation  Minda Ditto PharmD 07/19/2019,7:37 AM

## 2019-07-19 NOTE — Progress Notes (Signed)
[name] Kitchen  PROGRESS NOTE    Seham Wyss  [id number] DOB: 10/28/1935 DOA: 07/18/2019 PCP: Leighton Ruff, MD   Brief Narrative:   [name] is a 84 y.o. female with medical history significant for history of ovarian cancer status post TSH BSO in 2012 also had [place] and Taxol chemo, breast cancer-status post lumpectomy in 2005, diabetes mellitus on insulin, hypertension, pacemaker in place, mitral stenosis/aortic stenosis, morbid obesity, [name]  Patient also complains of dysuria and burning urine.  She has history of perforated duodenal ulcer in 2020 and initially failed the pain similar to that. She also had associated nausea but no vomiting or diarrhea. Patient otherwise denies any abdominal pain, focal weakness,speech difficulties [id number] systolic, heart rate in low 100, febrile 101.9, and also of up to 102.7.  Placement with normal CMP, that is 11.1, troponin XV, 17 and lactic acid 1.4, UA was consistent with UTI.  Patient was given ceftriaxone 1 g and she was about to be discharged when nursing noticed that she was somewhat dyspneic and needing oxygen confirmed with ABG where PO2 was 51, and underwent CTA that showed submassive PE and admission was requested  5/15: Remains on heparin gtt. BP ok off home meds. Watch vitals. Ok on heparin gtt for now. Spoke with her about eliquis/xarelto transition. Will need O2 at discharge. If she remains stable, look to make transition to oral AC in next 24- 48 hrs..   Assessment & Plan:   Active Problems:   Pulmonary emboli (HCC)  Extensive pulmonary embolus more severe in the right with right heart strain evidenced in the CT RV/LV ratio 1.4 Acute hypoxic respiratory failure needing supplemental oxygen     - Unclear etiology but patient is sedentary barely mobile, has remote history of breast and ovarian cancer patient.      - COVID 19 is negative and had both covid  vaccines last dose in march.     - PCCM onboard, appreciate assistance     - remains on heparin gtt and is hemodynamically stable     - if she begins to decompensate, will look for tPa; as of right now though, plan is if she remains stable to transition to eliquis     - she will need O2 at discharge     - she will likely need AC for life     - BLE dopplers negative  E coli UTI      - patient febrile     - COVID-19 negative     - UCx pos for e coli, sensitivies pending     - continue rocephin  HLD     - continue Lipitor  Diabetes mellitus on long-term insulin and Metformin     - A1c 7.0     - SSI, levemir 55 units BID, DM-diet; follow  Hypertension     - Blood pressure stable in the setting of submassive PE      - hold her metoprolol and losartan for now  Leg edema chronic     - continue lasix at lower dose 40 mg daily     - titrate up as vitals become more stable     - BLE dopplers negative  Status post TAVR in 2019, PPM  breast cancer-status post lumpectomy in 2005 history of ovarian cancer status post TSH BSO in 2012      - also had [place] and Taxol chemo  DVT prophylaxis: heparin Code Status: DNR Family Communication: Spoke with neice   Status is: Inpatient  Remains inpatient appropriate because:IV treatments appropriate due to intensity of illness or inability to take PO   Dispo: The patient is from: Home              Anticipated d/c is to: Home              Anticipated d/c date is: 2 days              Patient currently is not medically stable to d/c.  Consultants:   PCCM  Antimicrobials:  . rocephin   ROS:  Denies CP, N, V. Reports some dyspnea . Remainder 10-pt ROS is negative for all not previously mentioned.  Subjective: "My arthritis is bad."  Objective: Vitals:   07/19/19 0247 07/19/19 0400 07/19/19 0834 07/19/19 1304  BP:  (!) 121/48    Pulse:  88    Resp:  20    Temp: 98.4 F (36.9 C)  99.4 F (37.4 C) 98.7 F (37.1 C)    TempSrc: Oral  Oral Oral  SpO2:  94%      Intake/Output Summary (Last 24 hours) at 07/19/2019 1641 Last data filed at 07/19/2019 0600 Gross per 24 hour  Intake 347.15 ml  Output 1200 ml  Net -852.85 ml   There were no vitals filed for this visit.  Examination:  General: 84 y.o. female resting in bed in NAD Cardiovascular: tachy, +S1, S2, no m/g/r Respiratory: CTABL, no w/r/r, normal WOB GI: BS+, NDNT, no masses noted, no organomegaly noted MSK: No c/c; BLE edema Neuro: A&O x 3, no focal deficits Psyc: Appropriate interaction and affect, calm/cooperative   Data Reviewed: I have personally reviewed following labs and imaging studies.  CBC: Recent Labs  Lab 07/18/19 0122 07/19/19 0603  WBC 11.1* 8.7  NEUTROABS 8.6*  --   HGB 13.9 11.9*  HCT 41.6 37.6  MCV 95.2 96.7  PLT 171 0000000*   Basic Metabolic Panel: Recent Labs  Lab 07/18/19 0122 07/19/19 0603  NA 135 135  K 4.3 4.0  CL 98 101  CO2 25 24  GLUCOSE 210* 148*  BUN 21 25*  CREATININE 0.97 1.17*  CALCIUM 8.9 8.3*   GFR: CrCl cannot be calculated (Unknown ideal weight.). Liver Function Tests: Recent Labs  Lab 07/18/19 0122 07/19/19 0603  AST 18 17  ALT 16 15  ALKPHOS 68 55  BILITOT 1.2 1.0  PROT 7.6 6.6  ALBUMIN 4.1 3.4*   Recent Labs  Lab 07/18/19 0122  LIPASE 21   No results for input(s): AMMONIA in the last 168 hours. Coagulation Profile: Recent Labs  Lab 07/18/19 1258  INR 1.3*   Cardiac Enzymes: No results for input(s): CKTOTAL, CKMB, CKMBINDEX, TROPONINI in the last 168 hours. BNP (last 3 results) No results for input(s): PROBNP in the last 8760 hours. HbA1C: Recent Labs    07/18/19 1627  HGBA1C 7.0*   CBG: Recent Labs  Lab 07/18/19 1430 07/18/19 1619 07/18/19 2129 07/19/19 0751 07/19/19 1211  GLUCAP 183* 191* 154* 153* 132*   Lipid Profile: No results for input(s): CHOL, HDL, LDLCALC, TRIG, CHOLHDL, LDLDIRECT in the last 72 hours. Thyroid Function Tests: No results  for input(s): TSH, T4TOTAL, FREET4, T3FREE, THYROIDAB in the last 72 hours. Anemia Panel: No results for input(s): VITAMINB12, FOLATE, FERRITIN, TIBC, IRON, RETICCTPCT in the last 72 hours. Sepsis Labs: Recent Labs  Lab 07/18/19 0455  LATICACIDVEN 1.4    Recent  Results (from the past 240 hour(s))  Blood culture (routine x 2)     Status: None (Preliminary result)   Collection Time: 07/18/19  1:23 AM   Specimen: BLOOD  Result Value Ref Range Status   Specimen Description   Final    BLOOD RIGHT ANTECUBITAL Performed at [facility] [address] [place], [place] [address]    Special Requests   Final    BOTTLES DRAWN AEROBIC AND ANAEROBIC Blood Culture results may not be optimal due to an inadequate volume of blood received in culture bottles Performed at [facility] [address] [place], [place] [address]    Culture   Final    NO GROWTH 1 DAY Performed at [facility], [place] [address], [place], [place] [address]    Report Status PENDING  Incomplete  Blood culture (routine x 2)     Status: None (Preliminary result)   Collection Time: 07/18/19  1:28 AM   Specimen: BLOOD RIGHT FOREARM  Result Value Ref Range Status   Specimen Description BLOOD RIGHT FOREARM  Final   Special Requests   Final    BOTTLES DRAWN AEROBIC AND ANAEROBIC Blood Culture results may not be optimal due to an inadequate volume of blood received in culture bottles Performed at [facility], [place] [address], [place], [place] [address]    Culture   Final    NO GROWTH 1 DAY Performed at [facility], [place] [address], [place], [place] [address]    Report Status PENDING  Incomplete  Urine culture     Status: Abnormal (Preliminary result)   Collection Time: 07/18/19  5:47 AM   Specimen: Urine, Random  Result Value Ref Range Status   Specimen Description   Final    URINE, RANDOM Performed at [facility] [address], [place], [place] [address]    Special Requests   Final    NONE Performed at [facility], [place] [address], [place], [place] [address]    Culture >=100,000 COLONIES/mL ESCHERICHIA COLI (A)  Final   Report Status PENDING  Incomplete  SARS Coronavirus 2 by RT PCR (hospital order, performed in [facility] hospital lab) Nasopharyngeal Nasopharyngeal Swab     Status: None   Collection Time: 07/18/19 12:58 PM   Specimen: Nasopharyngeal Swab  Result Value Ref Range Status   SARS Coronavirus 2 NEGATIVE NEGATIVE Final    Comment: (NOTE) SARS-CoV-2 target nucleic acids are NOT DETECTED. The SARS-CoV-2 RNA is generally detectable in upper and lower respiratory specimens during the acute phase of infection. The lowest concentration of SARS-CoV-2 viral copies this assay can detect is 250 copies / mL. A negative result does not preclude SARS-CoV-2 infection and should not be used as the sole basis for treatment or other patient management decisions.  A negative result may occur with improper specimen collection / handling, submission of specimen other than nasopharyngeal swab, presence of viral mutation(s) within the areas targeted by this assay, and inadequate number of viral copies (<250 copies / mL). A negative result must be combined with clinical observations, patient history, and epidemiological information. Fact Sheet for Patients:   StrictlyIdeas.no Fact Sheet for Healthcare Providers: BankingDealers.co.za This test is not yet approved or cleared  by the [place] FDA and has been authorized for detection and/or diagnosis of SARS-CoV-2 by FDA under an Emergency Use Authorization (EUA).  This EUA will remain in effect (meaning this test can be  used) for the duration of the COVID-19 declaration under Section 564(b)(1) of the Act, 21 U.S.C. section 360bbb-3(b)(1), unless the authorization is terminated or revoked  sooner. Performed at [facility], [place] [address] [place], [place] [address]   MRSA PCR Screening     Status: Abnormal   Collection Time: 07/19/19  6:37 AM   Specimen: Nasal Mucosa; Nasopharyngeal  Result Value Ref Range Status   MRSA by PCR POSITIVE (A) NEGATIVE Final    Comment:        The GeneXpert MRSA Assay (FDA approved for NASAL specimens only), is one component of a comprehensive MRSA colonization surveillance program. It is not intended to diagnose MRSA infection nor to guide or monitor treatment for MRSA infections. RESULT CALLED TO, READ BACK BY AND VERIFIED WITH: M.FAULK,RN [id number] @1228  BY V.WILKINS Performed at [facility] [address], [place], [place] [address]       Radiology Studies: DG Chest 2 View  Result Date: 07/18/2019 CLINICAL DATA:  Fever and vomiting EXAM: CHEST - 2 VIEW COMPARISON:  03/22/2018 FINDINGS: Mild cardiomegaly. Unchanged position of left chest wall pacemaker. No focal airspace consolidation, pleural effusion or pulmonary edema. IMPRESSION: No active cardiopulmonary disease. Electronically Signed   By: Ulyses Jarred M.D.   On: 07/18/2019 02:09   CT Angio Chest PE W/Cm &/Or Wo Cm  Result Date: 07/18/2019 CLINICAL DATA:  Shortness of breath EXAM: CT ANGIOGRAPHY CHEST WITH CONTRAST TECHNIQUE: Multidetector CT imaging of the chest was performed using the standard protocol during bolus administration of intravenous contrast. Multiplanar CT image reconstructions and MIPs were obtained to evaluate the vascular anatomy. CONTRAST:  149mL OMNIPAQUE IOHEXOL 350 MG/ML SOLN COMPARISON:  Chest radiograph Jul 18, 2019; CT angiogram chest June 20, 2017; CT angiogram chest April 12, 2016 FINDINGS: Cardiovascular: There are multiple pulmonary emboli throughout both lower lobes as well as in the right upper lobe. Pulmonary embolus is seen in the distal most aspect of the left main pulmonary artery. The right ventricle to  left ventricle diameter ratio is 1.4, indicative of right heart strain. There is no thoracic aortic aneurysm or dissection. There are scattered foci of calcification in visualized great vessels. There is aortic atherosclerosis. Pacemaker leads are attached to the right atrium and right ventricle. There is extensive mitral annulus calcification. There is a prosthetic aortic valve. No pericardial effusion or pericardial thickening evident. Mediastinum/Nodes: There is a stable mass in the left thyroid containing calcification measuring 2.1 x 2.0 cm. A calcification along the inferior aspect of the left lobe of the thyroid measures 1.2 x 1.1 cm, stable. No new thyroid lesion evident. No evident thoracic adenopathy. No esophageal lesions are evident. Lungs/Pleura: There is mild bibasilar atelectasis. There is no appreciable edema or airspace opacity. No pleural effusions are evident. Upper Abdomen: There is hepatic steatosis. There is a cyst arising from the left kidney, incompletely visualized, measuring 6.5 x 6.0 cm. Visualized upper abdominal structures otherwise are unremarkable. Musculoskeletal: There is degenerative change in the thoracic spine. No blastic or lytic bone lesions. No chest wall lesions. Review of the MIP images confirms the above findings. IMPRESSION: 1. Extensive pulmonary embolus, more severe on the right than on the left, with right heart strain. Positive for acute PE with CTevidence of right heart strain (RV/LV Ratio = 1.4) consistent with at least submassive (intermediate risk) PE. The presence of right heart strain has been associated with an increased risk of morbidity and mortality. 2. Aortic atherosclerosis. No aneurysm or dissection  evident. Status post aortic valve replacement. Pacemaker leads attached to right atrium and right ventricle. 3.  Bibasilar atelectasis.  No airspace consolidation. 4.  No adenopathy. 5. Stable left lobe thyroid nodule. In the setting of significant comorbidities  or limited life expectancy, no follow-up recommended. Clinical assessment with respect to potential further evaluation of this nodule advised. (Ref: J Am Coll Radiol. 2015 Feb;12(2): 143-50). 6.  Hepatic steatosis. Aortic Atherosclerosis (ICD10-I70.0). Critical Value/emergent results were called by telephone at the time of interpretation on 07/18/2019 at 11:30 am to provider Lacretia Leigh , who verbally acknowledged these results. Electronically Signed   By: Lowella Grip III M.D.   On: 07/18/2019 11:31   ECHOCARDIOGRAM COMPLETE  Result Date: 07/19/2019    ECHOCARDIOGRAM REPORT   Patient Name:   BRICELYN FEIST Date of Exam: 07/19/2019 Medical Rec #:  [id number]     Height:       67.0 in Accession #:    [id number]    Weight:       277.2 lb Date of Birth:  [birth year]     BSA:          2.323 m Patient Age:    31 years      BP:           121/48 mmHg Patient Gender: F             HR:           88 bpm. Exam Location:  Inpatient Procedure: 2D Echo Indications:    Acute Respiratory Insufficiency R06.89  History:        Patient has prior history of Echocardiogram examinations, most                 recent 08/22/2017. Pacemaker, Aortic Valve Disease and Mitral                 Valve Disease; Risk Factors:Hypertension and Diabetes.                 Aortic Valve: 26 mm Edwards Sapien prosthetic, stented (TAVR)                 valve is present in the aortic position. Procedure Date:                 07/17/2017.  Sonographer:    Mikki Santee RDCS (AE) Referring Phys: [id number] [place] IMPRESSIONS  1. Abnormal septal motion ? from pacing . Left ventricular ejection fraction, by estimation, is 60 to 65%. The left ventricle has normal function. The left ventricle has no regional wall motion abnormalities. There is mild left ventricular hypertrophy. Left ventricular diastolic parameters are indeterminate.  2. Pacing wire in RA/RV . Right ventricular systolic function is normal. The right ventricular size is normal. There is mildly  elevated pulmonary artery systolic pressure.  3. Mean gradient across MV 11 mmHg but MVA by PT1/2 3.12 cm2 . The mitral valve is degenerative. Mild mitral valve regurgitation. No evidence of mitral stenosis.  4. Post TAVR with 26 mm Sapien 3 valve done 07/17/17 previous mean gradient 15 mmHg 06/24/19 trivial central AR no PVL Gradients stable since prior echo . The aortic valve has been repaired/replaced. Aortic valve regurgitation is trivial. No aortic stenosis is present. There is a 26 mm Edwards Sapien prosthetic (TAVR) valve present in the aortic position. Procedure Date: 07/17/2017.  5. The inferior vena cava is dilated in size with >50% respiratory variability, suggesting right atrial pressure of 8 mmHg. FINDINGS  Left Ventricle: Abnormal septal motion ? from pacing. Left ventricular ejection fraction, by estimation, is 60 to 65%. The left ventricle has normal function. The left ventricle has no regional wall motion abnormalities. The left ventricular internal cavity size was normal in size. There is mild left ventricular hypertrophy. Left ventricular diastolic parameters are indeterminate. Right Ventricle: Pacing wire in RA/RV. The right ventricular size is normal. No increase in right ventricular wall thickness. Right ventricular systolic function is normal. There is mildly elevated pulmonary artery systolic pressure. The tricuspid regurgitant velocity is 2.67 m/s, and with an assumed right atrial pressure of 15 mmHg, the estimated right ventricular systolic pressure is [id number] mmHg. Left Atrium: Left atrial size was normal in size. Right Atrium: Right atrial size was normal in size. Pericardium: There is no evidence of pericardial effusion. Mitral Valve: Mean gradient across MV 11 mmHg but MVA by PT1/2 3.12 cm2. The mitral valve is degenerative in appearance. There is severe thickening of the mitral valve leaflet(s). There is severe calcification of the mitral valve leaflet(s). Normal mobility of the mitral valve  leaflets. Severe mitral annular calcification. Mild mitral valve regurgitation. No evidence of mitral valve stenosis. MV peak gradient, 22.1 mmHg. The mean mitral valve gradient is 11.0 mmHg. Tricuspid Valve: The tricuspid valve is normal in structure. Tricuspid valve regurgitation is not demonstrated. No evidence of tricuspid stenosis. Aortic Valve: Post TAVR with 26 mm Sapien 3 valve done 07/17/17 previous mean gradient 15 mmHg 06/24/19 trivial central AR no PVL Gradients stable since prior echo. The aortic valve has been repaired/replaced. Aortic valve regurgitation is trivial. No aortic stenosis is present. Aortic valve mean gradient measures 15.5 mmHg. Aortic valve peak gradient measures 28.4 mmHg. Aortic valve area, by VTI measures 1.99 cm. There is a 26 mm Edwards Sapien prosthetic, stented (TAVR) valve present in the aortic position. Procedure Date: 07/17/2017. Pulmonic Valve: The pulmonic valve was normal in structure. Pulmonic valve regurgitation is not visualized. No evidence of pulmonic stenosis. Aorta: The aortic root is normal in size and structure. Venous: The inferior vena cava is dilated in size with greater than 50% respiratory variability, suggesting right atrial pressure of 8 mmHg. IAS/Shunts: No atrial level shunt detected by color flow Doppler.  LEFT VENTRICLE PLAX 2D LVIDd:         4.10 cm  Diastology LVIDs:         2.80 cm  LV e' lateral:   5.00 cm/s LV PW:         1.20 cm  LV E/e' lateral: 29.2 LV IVS:        1.20 cm  LV e' medial:    4.90 cm/s LVOT diam:     2.20 cm  LV E/e' medial:  29.8 LV SV:         98 LV SV Index:   42 LVOT Area:     3.80 cm  RIGHT VENTRICLE RV S prime:     12.50 cm/s TAPSE (M-mode): 1.7 cm LEFT ATRIUM           Index       RIGHT ATRIUM           Index LA diam:      3.30 cm 1.42 cm/m  RA Area:     14.30 cm LA Vol (A2C): 59.0 ml 25.40 ml/m RA Volume:   29.90 ml  12.87 ml/m LA Vol (A4C): 55.2 ml 23.76 ml/m  AORTIC VALVE AV Area (Vmax):    1.84 cm AV Area (Vmean):  1.89 cm AV Area (VTI):     1.99 cm AV Vmax:           266.50 cm/s AV Vmean:          184.500 cm/s AV VTI:            0.494 m AV Peak Grad:      28.4 mmHg AV Mean Grad:      15.5 mmHg LVOT Vmax:         129.00 cm/s LVOT Vmean:        91.500 cm/s LVOT VTI:          0.258 m LVOT/AV VTI ratio: 0.52  AORTA Ao Root diam: 2.80 cm MITRAL VALVE                TRICUSPID VALVE MV Area (PHT): 3.12 cm     TR Peak grad:   28.5 mmHg MV Peak grad:  22.1 mmHg    TR Vmax:        267.00 cm/s MV Mean grad:  11.0 mmHg MV Vmax:       2.35 m/s     SHUNTS MV Vmean:      160.0 cm/s   Systemic VTI:  0.26 m MV Decel Time: 243 msec     Systemic Diam: 2.20 cm MV E velocity: 146.00 cm/s MV A velocity: 185.00 cm/s MV E/A ratio:  0.79 Jenkins Rouge MD Electronically signed by Jenkins Rouge MD Signature Date/Time: 07/19/2019/10:32:37 AM    Final    VAS [place] LOWER EXTREMITY VENOUS (DVT)  Result Date: 07/19/2019  Lower Venous DVTStudy Indications: Swelling.  Limitations: Body habitus and poor ultrasound/tissue interface. Comparison Study: none Performing Technologist: June Leap RDMS, RVT  Examination Guidelines: A complete evaluation includes B-mode imaging, spectral Doppler, color Doppler, and power Doppler as needed of all accessible portions of each vessel. Bilateral testing is considered an integral part of a complete examination. Limited examinations for reoccurring indications may be performed as noted. The reflux portion of the exam is performed with the patient in reverse Trendelenburg.  +---------+---------------+---------+-----------+----------+-------------------+ RIGHT    CompressibilityPhasicitySpontaneityPropertiesThrombus Aging      +---------+---------------+---------+-----------+----------+-------------------+ CFV      Full           Yes      Yes                                      +---------+---------------+---------+-----------+----------+-------------------+ SFJ      Full                                                              +---------+---------------+---------+-----------+----------+-------------------+ FV Prox  Full                                                             +---------+---------------+---------+-----------+----------+-------------------+ FV Mid   Full                                                             +---------+---------------+---------+-----------+----------+-------------------+  FV DistalFull                                                             +---------+---------------+---------+-----------+----------+-------------------+ PFV      Full                                                             +---------+---------------+---------+-----------+----------+-------------------+ POP      Full           Yes      Yes                  not well visualized +---------+---------------+---------+-----------+----------+-------------------+ PTV      Full                                                             +---------+---------------+---------+-----------+----------+-------------------+ PERO     Full                                         not well visualized +---------+---------------+---------+-----------+----------+-------------------+   +---------+---------------+---------+-----------+----------+-------------------+ LEFT     CompressibilityPhasicitySpontaneityPropertiesThrombus Aging      +---------+---------------+---------+-----------+----------+-------------------+ CFV      Full           Yes      Yes                                      +---------+---------------+---------+-----------+----------+-------------------+ SFJ      Full                                                             +---------+---------------+---------+-----------+----------+-------------------+ FV Prox  Full                                                              +---------+---------------+---------+-----------+----------+-------------------+ FV Mid   Full                                                             +---------+---------------+---------+-----------+----------+-------------------+ FV DistalFull                                                             +---------+---------------+---------+-----------+----------+-------------------+  PFV      Full                                                             +---------+---------------+---------+-----------+----------+-------------------+ POP      Full           Yes      Yes                  not well visualized +---------+---------------+---------+-----------+----------+-------------------+ PTV      Full                                                             +---------+---------------+---------+-----------+----------+-------------------+ PERO     Full                                         not well visualized +---------+---------------+---------+-----------+----------+-------------------+     Summary: BILATERAL: - No evidence of deep vein thrombosis seen in the lower extremities, bilaterally. -No evidence of popliteal cyst, bilaterally.   *See table(s) above for measurements and observations.    Preliminary      Scheduled Meds: . atorvastatin  20 mg Oral QHS  . Chlorhexidine Gluconate Cloth  6 each Topical Daily  . cholecalciferol  1,000 Units Oral BID  . FLUoxetine  40 mg Oral Daily  . [START ON 07/20/2019] furosemide  40 mg Oral 2 times per day on Sun Mon Wed Fri  . furosemide  40 mg Oral Once per day on Tue Thu Sat  . insulin aspart  0-15 Units Subcutaneous TID WC  . insulin aspart  0-5 Units Subcutaneous QHS  . insulin detemir  55 Units Subcutaneous BID  . magnesium oxide  400 mg Oral QHS  . mouth rinse  15 mL Mouth Rinse BID  . multivitamin with minerals  1 tablet Oral Daily  . omega-3 acid ethyl esters  1 g Oral Daily  . pantoprazole  40 mg Oral  BID  . saccharomyces boulardii  250 mg Oral BID   Continuous Infusions: . cefTRIAXone (ROCEPHIN)  IV Stopped (07/19/19 0630)  . heparin 1,800 Units/hr (07/19/19 1519)     LOS: 1 day    Time spent: 35 minutes spent in the coordination of care today.   Jonnie Finner, DO [facility]  If 7PM-7AM, please contact night-coverage [email or web address] 07/19/2019, 4:41 PM

## 2019-07-19 NOTE — Progress Notes (Signed)
  Echocardiogram 2D Echocardiogram has been performed.  Jennette Dubin 07/19/2019, 9:52 AM

## 2019-07-19 NOTE — Progress Notes (Signed)
Lower venous duplex       has been completed. Preliminary results can be found under CV proc through chart review. Lemon Sternberg, BS, RDMS, RVT   

## 2019-07-19 NOTE — Progress Notes (Signed)
Report given to [facility]. Pt transported to 1427 in stable condition.

## 2019-07-19 NOTE — Progress Notes (Signed)
[place] for Heparin Indication: pulmonary embolus  Allergies  Allergen Reactions  . Nsaids Anaphylaxis  . Tape Rash    CLEAR PLASTIC TAPE    Patient Measurements:   Heparin Dosing Weight: 91.6 kg  Vital Signs: Temp: 100.1 F (37.8 C) (05/15 1836) Temp Source: Oral (05/15 1836) BP: 125/45 (05/15 1600) Pulse Rate: 91 (05/15 1800)  Labs: Recent Labs    07/18/19 0122 07/18/19 1257 07/18/19 1258 07/18/19 2159 07/19/19 0603 07/19/19 1828  HGB 13.9  --   --   --  11.9*  --   HCT 41.6  --   --   --  37.6  --   PLT 171  --   --   --  137*  --   APTT  --   --  126*  --   --   --   LABPROT  --   --  16.0*  --   --   --   INR  --   --  1.3*  --   --   --   HEPARINUNFRC  --   --   --  0.57 0.64 0.73*  CREATININE 0.97  --   --   --  1.17*  --   TROPONINIHS 15 17  --   --   --   --     CrCl cannot be calculated (Unknown ideal weight.).   Medical History: Past Medical History:  Diagnosis Date  . Anxiety   . Aortic stenosis   . Arthritis   . Breast cancer [facility])    CANCER  . Diabetes mellitus    Type II  . Dyspnea   . Heart murmur   . History of chemotherapy   . History of kidney stones   . Hypertension   . Mitral stenosis   . Morbid obesity (HCC)    BMI > 40  . Ovarian cancer ([place])   . Presence of permanent cardiac pacemaker    [employer]  . Pulmonary embolism ([place]) 2013   post op  . 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 Spinal stenosis     Assessment: 84 y/o F with a h/o PE and TAVR not on chronic anticoagulation initially d/c from ED after treatment for UTI noted to be dyspneic and found to have PE with right heart strain.  Heparin started with 3500 unit load, 1850 units/hr, first level 0.57 units/ml, in therapeutic range  Today, 07/19/19  Heparin level just above therapeutic range and  rose from earlier today on current IV heparin rate of 1800 units/hr  Per RN, no issues or bleeding noted  Goal of Therapy:  Heparin level 0.3-0.7 units/ml Monitor platelets by anticoagulation protocol: Yes   Today, 07/19/2019 2nd Hep level 0600 = 0.64 units/ml, on 1800 units/hr, level slightly increased   Plan:  Reduce IV heparin from 1800 to 1450 units/hr Recheck heparin level 8 hrs after rate reduced Daily CBC, Heparin level Follow up for oral anti-coagulation   Adrian Saran, PharmD, BCPS 07/19/2019 7:32 PM

## 2019-07-20 DIAGNOSIS — M1990 Unspecified osteoarthritis, unspecified site: Secondary | ICD-10-CM

## 2019-07-20 DIAGNOSIS — Z794 Long term (current) use of insulin: Secondary | ICD-10-CM

## 2019-07-20 DIAGNOSIS — E119 Type 2 diabetes mellitus without complications: Secondary | ICD-10-CM

## 2019-07-20 LAB — CBC
HCT: 36.4 % (ref 36.0–46.0)
Hemoglobin: 11.7 g/dL — ABNORMAL LOW (ref 12.0–15.0)
MCH: 30.7 pg (ref 26.0–34.0)
MCHC: 32.1 g/dL (ref 30.0–36.0)
MCV: 95.5 fL (ref 80.0–100.0)
Platelets: 135 10*3/uL — ABNORMAL LOW (ref 150–400)
RBC: 3.81 MIL/uL — ABNORMAL LOW (ref 3.87–5.11)
RDW: 13.7 % (ref 11.5–15.5)
WBC: 5.6 10*3/uL (ref 4.0–10.5)
nRBC: 0 % (ref 0.0–0.2)

## 2019-07-20 LAB — COMPREHENSIVE METABOLIC PANEL
ALT: 22 U/L (ref 0–44)
AST: 25 U/L (ref 15–41)
Albumin: 3.1 g/dL — ABNORMAL LOW (ref 3.5–5.0)
Alkaline Phosphatase: 63 U/L (ref 38–126)
Anion gap: 8 (ref 5–15)
BUN: 22 mg/dL (ref 8–23)
CO2: 27 mmol/L (ref 22–32)
Calcium: 8.1 mg/dL — ABNORMAL LOW (ref 8.9–10.3)
Chloride: 102 mmol/L (ref 98–111)
Creatinine, Ser: 1.09 mg/dL — ABNORMAL HIGH (ref 0.44–1.00)
GFR calc Af Amer: 55 mL/min — ABNORMAL LOW (ref >60)
GFR calc non Af Amer: 47 mL/min — ABNORMAL LOW (ref >60)
Glucose, Bld: 105 mg/dL — ABNORMAL HIGH (ref 70–99)
Potassium: 3.7 mmol/L (ref 3.5–5.1)
Sodium: 137 mmol/L (ref 135–145)
Total Bilirubin: 0.7 mg/dL (ref 0.3–1.2)
Total Protein: 6.3 g/dL — ABNORMAL LOW (ref 6.5–8.1)

## 2019-07-20 LAB — HEPARIN LEVEL (UNFRACTIONATED): Heparin Unfractionated: 0.5 IU/mL (ref 0.30–0.70)

## 2019-07-20 LAB — URINE CULTURE: Culture: >=100000 — AB

## 2019-07-20 LAB — GLUCOSE, CAPILLARY
Glucose-Capillary: 98 mg/dL (ref 70–99)
Glucose-Capillary: 94 mg/dL (ref 70–99)
Glucose-Capillary: 96 mg/dL (ref 70–99)
Glucose-Capillary: 92 mg/dL (ref 70–99)

## 2019-07-20 LAB — MAGNESIUM: Magnesium: 2.2 mg/dL (ref 1.7–2.4)

## 2019-07-20 MED ORDER — AMOXICILLIN-POT CLAVULANATE 875-125 MG PO TABS
1.0000 | ORAL_TABLET | Freq: Two times a day (BID) | ORAL | Status: DC
  Administered 2019-07-21 (×2): 1 via ORAL
  Filled 2019-07-20 (×2): qty 1

## 2019-07-20 NOTE — Progress Notes (Signed)
[name] Kitchen  PROGRESS NOTE    [name]  [id number] DOB: 10-05-1935 DOA: 07/18/2019 PCP: Leighton Ruff, MD   Brief Narrative:   [name] a 84 y.o.femalewith medical history significant forhistory of ovarian cancer status post TSH BSO in 2012 also had [place] and Taxol chemo, breast cancer-status post lumpectomy in 2005, diabetes mellitus on insulin, hypertension, pacemaker in place, mitral stenosis/aortic stenosis,morbid obesity,[name] Patient also complains of dysuria and burning urine. She has history of perforated duodenal ulcer in 2020 and initially failed the pain similar to that. She also had associated nausea but no vomiting or diarrhea. Patient otherwise denies anyabdominal pain, focal weakness,speech difficulties [id number] systolic, heart rate in low 100, febrile 101.9,and also of up to 102.7.Placement with normal CMP, that is 11.1, troponin XV, 17 and lactic acid 1.4, UA was consistent with UTI.Patient was given ceftriaxone 1 g and she was about to be discharged when nursing noticed that she was somewhat dyspneic and needing oxygen confirmed with ABG where PO2 was 51, and underwent CTA that showed submassive PE and admission was requested  5/15: Remains on heparin gtt. BP ok off home meds. Watch vitals. Ok on heparin gtt for now. Spoke with her about eliquis/xarelto transition. Will need O2 at discharge. If she remains stable, look to make transition to oral AC in next 24- 48 hrs.  5/16: Had fever and some low pressures ON. Let's hold on transition to PO AC today. Reassess for tomorrow. If stable ON, will make the transition to eliquis. She is otherwise ok.    Assessment & Plan:   Active Problems:   Pulmonary emboli (HCC)  Extensive pulmonary embolus more severe in the right with right heart strain evidenced in the CT RV/LV ratio 1.4 Acute hypoxic respiratory failure needing  supplemental oxygen     - Unclear etiology but patient is sedentary barely mobile, has remote history of breast and ovarian cancer patient.      - COVID 19 is negative and had both covid vaccines last dose in march.     - PCCM onboard, appreciate assistance     - remains on heparin gtt and is hemodynamically stable     - if she begins to decompensate, will look for tPa; as of right now though, plan is if she remains stable to transition to eliquis     - she will need O2 at discharge     - she will likely need AC for life     - BLE dopplers negative     - 5/16: some fevers and low-normal BPs ON. Let's hold on transition to oral AC today. Will reasses in the morning.   E coli UTI      - patient febrile     - COVID-19 negative     - UCx pos for e coli, sensitivies pending     - continue rocephin     - 5/16: she's had 3 days rocephin. Transition to augmentin in AM.   HLD     - continue Lipitor  Diabetes mellitus on long-term insulin and Metformin     - A1c 7.0     - SSI, levemir 55 units BID, DM-diet; follow     - 5/16: glucose ok, follow.  Hypertension     - Blood pressure stable in the setting of submassive PE      - hold her metoprolol and losartan for now     -  5/16: continue holding metoprolol and losartan  Leg edema chronic     - continue lasix at lower dose 40 mg daily     - titrate up as vitals become more stable     - BLE dopplers negative     - 5/16: lasix increased to home regimen, follow.  Status post TAVR in 2019, PPM  breast cancer-status post lumpectomy in 2005 history of ovarian cancer status post TSH BSO in 2012      - also had carbo and Taxol chemo   DVT prophylaxis: heparin Code Status: DNR   Status is: Inpatient  Remains inpatient appropriate because:IV treatments appropriate due to intensity of illness or inability to take PO   Dispo: The patient is from: Home              Anticipated d/c is to: Home              Anticipated d/c date is: 2  days              Patient currently is not medically stable to d/c.  Consultants:   PCCM  Procedures:   None  Antimicrobials:  . Rocephin   ROS:  Denies CP, N, V, dyspnea . Remainder 10-pt ROS is negative for all not previously mentioned.  Subjective: "The situation is funny."  Objective: Vitals:   07/19/19 2323 07/20/19 0107 07/20/19 0537 07/20/19 1237  BP:  124/70 123/84 (!) 117/52  Pulse:  78 71 72  Resp:  18 18 17   Temp:  99.2 F (37.3 C) 98.3 F (36.8 C) 98.5 F (36.9 C)  TempSrc:    Oral  SpO2:  98% 99% 97%  Weight: 124.1 kg     Height: 5\' 7"  (1.702 m)       Intake/Output Summary (Last 24 hours) at 07/20/2019 1619 Last data filed at 07/20/2019 1553 Gross per 24 hour  Intake 810.78 ml  Output 1100 ml  Net -289.22 ml   Filed Weights   07/19/19 2323  Weight: 124.1 kg    Examination:  General: 84 y.o. female resting in bed in NAD Cardiovascular: RRR, +S1, S2, no m/g/r Respiratory: CTABL, no w/r/r, normal WOB GI: BS+, NDNT, no masses noted, no organomegaly noted MSK: No e/c/c Neuro: A&O x 3, no focal deficits Psyc: Appropriate interaction and affect, calm/cooperative   Data Reviewed: I have personally reviewed following labs and imaging studies.  CBC: Recent Labs  Lab 07/18/19 0122 07/19/19 0603 07/20/19 0501  WBC 11.1* 8.7 5.6  NEUTROABS 8.6*  --   --   HGB 13.9 11.9* 11.7*  HCT 41.6 37.6 36.4  MCV 95.2 96.7 95.5  PLT 171 137* [id number]*   Basic Metabolic Panel: Recent Labs  Lab 07/18/19 0122 07/19/19 0603 07/20/19 0501  NA 135 135 137  K 4.3 4.0 3.7  CL 98 101 102  CO2 25 24 27   GLUCOSE 210* 148* 105*  BUN 21 25* 22  CREATININE 0.97 1.17* 1.09*  CALCIUM 8.9 8.3* 8.1*  MG  --   --  2.2   GFR: Estimated Creatinine Clearance: 54.4 mL/min (A) (by C-G formula based on SCr of 1.09 mg/dL (H)). Liver Function Tests: Recent Labs  Lab 07/18/19 0122 07/19/19 0603 07/20/19 0501  AST 18 17 25   ALT 16 15 22   ALKPHOS 68 55 63  BILITOT  1.2 1.0 0.7  PROT 7.6 6.6 6.3*  ALBUMIN 4.1 3.4* 3.1*   Recent Labs  Lab 07/18/19 0122  LIPASE 21   No  results for input(s): AMMONIA in the last 168 hours. Coagulation Profile: Recent Labs  Lab 07/18/19 1258  INR 1.3*   Cardiac Enzymes: No results for input(s): CKTOTAL, CKMB, CKMBINDEX, TROPONINI in the last 168 hours. BNP (last 3 results) No results for input(s): PROBNP in the last 8760 hours. HbA1C: Recent Labs    07/18/19 1627  HGBA1C 7.0*   CBG: Recent Labs  Lab 07/19/19 1640 07/19/19 2102 07/19/19 2324 07/20/19 0757 07/20/19 1210  GLUCAP 126* 142* 124* 98 94   Lipid Profile: No results for input(s): CHOL, HDL, LDLCALC, TRIG, CHOLHDL, LDLDIRECT in the last 72 hours. Thyroid Function Tests: No results for input(s): TSH, T4TOTAL, FREET4, T3FREE, THYROIDAB in the last 72 hours. Anemia Panel: No results for input(s): VITAMINB12, FOLATE, FERRITIN, TIBC, IRON, RETICCTPCT in the last 72 hours. Sepsis Labs: Recent Labs  Lab 07/18/19 0455  LATICACIDVEN 1.4    Recent Results (from the past 240 hour(s))  Blood culture (routine x 2)     Status: None (Preliminary result)   Collection Time: 07/18/19  1:23 AM   Specimen: BLOOD  Result Value Ref Range Status   Specimen Description   Final    BLOOD RIGHT ANTECUBITAL Performed at [facility] [address], [place], [place] [address]    Special Requests   Final    BOTTLES DRAWN AEROBIC AND ANAEROBIC Blood Culture results may not be optimal due to an inadequate volume of blood received in culture bottles Performed at [facility] [address], [place], [place] [address]    Culture   Final    NO GROWTH 2 DAYS Performed at [facility] [address], [place], [place] [address]    Report Status PENDING  Incomplete  Blood culture (routine x 2)     Status: None (Preliminary result)   Collection Time: 07/18/19  1:28 AM   Specimen: BLOOD RIGHT FOREARM  Result Value Ref  Range Status   Specimen Description BLOOD RIGHT FOREARM  Final   Special Requests   Final    BOTTLES DRAWN AEROBIC AND ANAEROBIC Blood Culture results may not be optimal due to an inadequate volume of blood received in culture bottles Performed at [facility], [place] [address] [place], [place] [address]    Culture   Final    NO GROWTH 2 DAYS Performed at [facility] [address], [place], [place] [address]    Report Status PENDING  Incomplete  Urine culture     Status: Abnormal   Collection Time: 07/18/19  5:47 AM   Specimen: Urine, Random  Result Value Ref Range Status   Specimen Description   Final    URINE, RANDOM Performed at [facility] [address] [place], [place] [address]    Special Requests   Final    NONE Performed at [facility] [place] [address] [place], [place] [address]    Culture >=100,000 COLONIES/mL ESCHERICHIA COLI (A)  Final   Report Status 07/20/2019 FINAL  Final   Organism ID, Bacteria ESCHERICHIA COLI (A)  Final      Susceptibility   Escherichia coli - MIC*    AMPICILLIN 4 SENSITIVE Sensitive     CEFAZOLIN <=4 SENSITIVE Sensitive     CEFTRIAXONE <=1 SENSITIVE Sensitive     CIPROFLOXACIN <=0.25 SENSITIVE Sensitive     GENTAMICIN <=1 SENSITIVE Sensitive     IMIPENEM <=0.25 SENSITIVE Sensitive     NITROFURANTOIN <=16 SENSITIVE Sensitive  TRIMETH/SULFA <=20 SENSITIVE Sensitive     AMPICILLIN/SULBACTAM <=2 SENSITIVE Sensitive     PIP/TAZO <=4 SENSITIVE Sensitive     * >=100,000 COLONIES/mL ESCHERICHIA COLI  SARS Coronavirus 2 by RT PCR (hospital order, performed in [facility] hospital lab) Nasopharyngeal Nasopharyngeal Swab     Status: None   Collection Time: 07/18/19 12:58 PM   Specimen: Nasopharyngeal Swab  Result Value Ref Range Status   SARS Coronavirus 2 NEGATIVE NEGATIVE Final    Comment: (NOTE) SARS-CoV-2 target nucleic acids are NOT DETECTED. The SARS-CoV-2 RNA is  generally detectable in upper and lower respiratory specimens during the acute phase of infection. The lowest concentration of SARS-CoV-2 viral copies this assay can detect is 250 copies / mL. A negative result does not preclude SARS-CoV-2 infection and should not be used as the sole basis for treatment or other patient management decisions.  A negative result may occur with improper specimen collection / handling, submission of specimen other than nasopharyngeal swab, presence of viral mutation(s) within the areas targeted by this assay, and inadequate number of viral copies (<250 copies / mL). A negative result must be combined with clinical observations, patient history, and epidemiological information. Fact Sheet for Patients:   StrictlyIdeas.no Fact Sheet for Healthcare Providers: BankingDealers.co.za This test is not yet approved or cleared  by the [place] FDA and has been authorized for detection and/or diagnosis of SARS-CoV-2 by FDA under an Emergency Use Authorization (EUA).  This EUA will remain in effect (meaning this test can be used) for the duration of the COVID-19 declaration under Section 564(b)(1) of the Act, 21 U.S.C. section 360bbb-3(b)(1), unless the authorization is terminated or revoked sooner. Performed at [facility], [place] [address], [place], [place] [address]   MRSA PCR Screening     Status: Abnormal   Collection Time: 07/19/19  6:37 AM   Specimen: Nasal Mucosa; Nasopharyngeal  Result Value Ref Range Status   MRSA by PCR POSITIVE (A) NEGATIVE Final    Comment:        The GeneXpert MRSA Assay (FDA approved for NASAL specimens only), is one component of a comprehensive MRSA colonization surveillance program. It is not intended to diagnose MRSA infection nor to guide or monitor treatment for MRSA infections. RESULT CALLED TO, READ BACK BY AND VERIFIED WITH: M.FAULK,RN [id number] @1228  BY  V.WILKINS Performed at [facility] [address] [place],  [address]       Radiology Studies: ECHOCARDIOGRAM COMPLETE  Result Date: 07/19/2019    ECHOCARDIOGRAM REPORT   Patient Name:   VICKILYNN GRATTAN Date of Exam: 07/19/2019 Medical Rec #:  [id number]     Height:       67.0 in Accession #:    [id number]    Weight:       277.2 lb Date of Birth:  06-Feb-1936     BSA:          2.323 m Patient Age:    77 years      BP:           121/48 mmHg Patient Gender: F             HR:           88 bpm. Exam Location:  Inpatient Procedure: 2D Echo Indications:    Acute Respiratory Insufficiency R06.89  History:        Patient has prior history of Echocardiogram examinations, most  recent 08/22/2017. Pacemaker, Aortic Valve Disease and Mitral                 Valve Disease; Risk Factors:Hypertension and Diabetes.                 Aortic Valve: 26 mm Edwards Sapien prosthetic, stented (TAVR)                 valve is present in the aortic position. Procedure Date:                 07/17/2017.  Sonographer:    Mikki Santee RDCS (AE) Referring Phys: [id number] [place] IMPRESSIONS  1. Abnormal septal motion ? from pacing . Left ventricular ejection fraction, by estimation, is 60 to 65%. The left ventricle has normal function. The left ventricle has no regional wall motion abnormalities. There is mild left ventricular hypertrophy. Left ventricular diastolic parameters are indeterminate.  2. Pacing wire in RA/RV . Right ventricular systolic function is normal. The right ventricular size is normal. There is mildly elevated pulmonary artery systolic pressure.  3. Mean gradient across MV 11 mmHg but MVA by PT1/2 3.12 cm2 . The mitral valve is degenerative. Mild mitral valve regurgitation. No evidence of mitral stenosis.  4. Post TAVR with 26 mm Sapien 3 valve done 07/17/17 previous mean gradient 15 mmHg 06/24/19 trivial central AR no PVL Gradients stable since prior echo . The aortic valve has  been repaired/replaced. Aortic valve regurgitation is trivial. No aortic stenosis is present. There is a 26 mm Edwards Sapien prosthetic (TAVR) valve present in the aortic position. Procedure Date: 07/17/2017.  5. The inferior vena cava is dilated in size with >50% respiratory variability, suggesting right atrial pressure of 8 mmHg. FINDINGS  Left Ventricle: Abnormal septal motion ? from pacing. Left ventricular ejection fraction, by estimation, is 60 to 65%. The left ventricle has normal function. The left ventricle has no regional wall motion abnormalities. The left ventricular internal cavity size was normal in size. There is mild left ventricular hypertrophy. Left ventricular diastolic parameters are indeterminate. Right Ventricle: Pacing wire in RA/RV. The right ventricular size is normal. No increase in right ventricular wall thickness. Right ventricular systolic function is normal. There is mildly elevated pulmonary artery systolic pressure. The tricuspid regurgitant velocity is 2.67 m/s, and with an assumed right atrial pressure of 15 mmHg, the estimated right ventricular systolic pressure is [id number] mmHg. Left Atrium: Left atrial size was normal in size. Right Atrium: Right atrial size was normal in size. Pericardium: There is no evidence of pericardial effusion. Mitral Valve: Mean gradient across MV 11 mmHg but MVA by PT1/2 3.12 cm2. The mitral valve is degenerative in appearance. There is severe thickening of the mitral valve leaflet(s). There is severe calcification of the mitral valve leaflet(s). Normal mobility of the mitral valve leaflets. Severe mitral annular calcification. Mild mitral valve regurgitation. No evidence of mitral valve stenosis. MV peak gradient, 22.1 mmHg. The mean mitral valve gradient is 11.0 mmHg. Tricuspid Valve: The tricuspid valve is normal in structure. Tricuspid valve regurgitation is not demonstrated. No evidence of tricuspid stenosis. Aortic Valve: Post TAVR with 26 mm Sapien 3  valve done 07/17/17 previous mean gradient 15 mmHg 06/24/19 trivial central AR no PVL Gradients stable since prior echo. The aortic valve has been repaired/replaced. Aortic valve regurgitation is trivial. No aortic stenosis is present. Aortic valve mean gradient measures 15.5 mmHg. Aortic valve peak gradient measures 28.4 mmHg. Aortic valve area, by VTI measures 1.99  cm. There is a 26 mm Edwards Sapien prosthetic, stented (TAVR) valve present in the aortic position. Procedure Date: 07/17/2017. Pulmonic Valve: The pulmonic valve was normal in structure. Pulmonic valve regurgitation is not visualized. No evidence of pulmonic stenosis. Aorta: The aortic root is normal in size and structure. Venous: The inferior vena cava is dilated in size with greater than 50% respiratory variability, suggesting right atrial pressure of 8 mmHg. IAS/Shunts: No atrial level shunt detected by color flow Doppler.  LEFT VENTRICLE PLAX 2D LVIDd:         4.10 cm  Diastology LVIDs:         2.80 cm  LV e' lateral:   5.00 cm/s LV PW:         1.20 cm  LV E/e' lateral: 29.2 LV IVS:        1.20 cm  LV e' medial:    4.90 cm/s LVOT diam:     2.20 cm  LV E/e' medial:  29.8 LV SV:         98 LV SV Index:   42 LVOT Area:     3.80 cm  RIGHT VENTRICLE RV S prime:     12.50 cm/s TAPSE (M-mode): 1.7 cm LEFT ATRIUM           Index       RIGHT ATRIUM           Index LA diam:      3.30 cm 1.42 cm/m  RA Area:     14.30 cm LA Vol (A2C): 59.0 ml 25.40 ml/m RA Volume:   29.90 ml  12.87 ml/m LA Vol (A4C): 55.2 ml 23.76 ml/m  AORTIC VALVE AV Area (Vmax):    1.84 cm AV Area (Vmean):   1.89 cm AV Area (VTI):     1.99 cm AV Vmax:           266.50 cm/s AV Vmean:          184.500 cm/s AV VTI:            0.494 m AV Peak Grad:      28.4 mmHg AV Mean Grad:      15.5 mmHg LVOT Vmax:         129.00 cm/s LVOT Vmean:        91.500 cm/s LVOT VTI:          0.258 m LVOT/AV VTI ratio: 0.52  AORTA Ao Root diam: 2.80 cm MITRAL VALVE                TRICUSPID VALVE MV Area  (PHT): 3.12 cm     TR Peak grad:   28.5 mmHg MV Peak grad:  22.1 mmHg    TR Vmax:        267.00 cm/s MV Mean grad:  11.0 mmHg MV Vmax:       2.35 m/s     SHUNTS MV Vmean:      160.0 cm/s   Systemic VTI:  0.26 m MV Decel Time: 243 msec     Systemic Diam: 2.20 cm MV E velocity: 146.00 cm/s MV A velocity: 185.00 cm/s MV E/A ratio:  0.79 Jenkins Rouge MD Electronically signed by Jenkins Rouge MD Signature Date/Time: 07/19/2019/10:32:37 AM    Final    VAS [place] LOWER EXTREMITY VENOUS (DVT)  Result Date: 07/20/2019  Lower Venous DVTStudy Indications: Swelling.  Limitations: Body habitus and poor ultrasound/tissue interface. Comparison Study: none Performing Technologist: June Leap RDMS, RVT  Examination Guidelines: A  complete evaluation includes B-mode imaging, spectral Doppler, color Doppler, and power Doppler as needed of all accessible portions of each vessel. Bilateral testing is considered an integral part of a complete examination. Limited examinations for reoccurring indications may be performed as noted. The reflux portion of the exam is performed with the patient in reverse Trendelenburg.  +---------+---------------+---------+-----------+----------+-------------------+ RIGHT    CompressibilityPhasicitySpontaneityPropertiesThrombus Aging      +---------+---------------+---------+-----------+----------+-------------------+ CFV      Full           Yes      Yes                                      +---------+---------------+---------+-----------+----------+-------------------+ SFJ      Full                                                             +---------+---------------+---------+-----------+----------+-------------------+ FV Prox  Full                                                             +---------+---------------+---------+-----------+----------+-------------------+ FV Mid   Full                                                              +---------+---------------+---------+-----------+----------+-------------------+ FV DistalFull                                                             +---------+---------------+---------+-----------+----------+-------------------+ PFV      Full                                                             +---------+---------------+---------+-----------+----------+-------------------+ POP      Full           Yes      Yes                  not well visualized +---------+---------------+---------+-----------+----------+-------------------+ PTV      Full                                                             +---------+---------------+---------+-----------+----------+-------------------+ PERO     Full  not well visualized +---------+---------------+---------+-----------+----------+-------------------+   +---------+---------------+---------+-----------+----------+-------------------+ LEFT     CompressibilityPhasicitySpontaneityPropertiesThrombus Aging      +---------+---------------+---------+-----------+----------+-------------------+ CFV      Full           Yes      Yes                                      +---------+---------------+---------+-----------+----------+-------------------+ SFJ      Full                                                             +---------+---------------+---------+-----------+----------+-------------------+ FV Prox  Full                                                             +---------+---------------+---------+-----------+----------+-------------------+ FV Mid   Full                                                             +---------+---------------+---------+-----------+----------+-------------------+ FV DistalFull                                                             +---------+---------------+---------+-----------+----------+-------------------+  PFV      Full                                                             +---------+---------------+---------+-----------+----------+-------------------+ POP      Full           Yes      Yes                  not well visualized +---------+---------------+---------+-----------+----------+-------------------+ PTV      Full                                                             +---------+---------------+---------+-----------+----------+-------------------+ PERO     Full                                         not well visualized +---------+---------------+---------+-----------+----------+-------------------+     Summary: BILATERAL: - No evidence of deep vein thrombosis seen in the lower extremities, bilaterally. -No evidence of popliteal cyst,  bilaterally.   *See table(s) above for measurements and observations. Electronically signed by Ruta Hinds MD on 07/20/2019 at 8:52:48 AM.    Final      Scheduled Meds: . atorvastatin  20 mg Oral QHS  . cholecalciferol  1,000 Units Oral BID  . FLUoxetine  40 mg Oral Daily  . furosemide  40 mg Oral 2 times per day on Sun Mon Wed Fri  . furosemide  40 mg Oral Once per day on Tue Thu Sat  . insulin aspart  0-15 Units Subcutaneous TID WC  . insulin aspart  0-5 Units Subcutaneous QHS  . insulin detemir  55 Units Subcutaneous BID  . magnesium oxide  400 mg Oral QHS  . mouth rinse  15 mL Mouth Rinse BID  . multivitamin with minerals  1 tablet Oral Daily  . omega-3 acid ethyl esters  1 g Oral Daily  . pantoprazole  40 mg Oral BID  . saccharomyces boulardii  250 mg Oral BID   Continuous Infusions: . cefTRIAXone (ROCEPHIN)  IV Stopped (07/20/19 0740)  . heparin 1,450 Units/hr (07/20/19 0953)     LOS: 2 days    Time spent: 25 minutes spent in the coordination of care today.   Jonnie Finner, DO [facility]  If 7PM-7AM, please contact night-coverage [email or web address] 07/20/2019, 4:19 PM

## 2019-07-20 NOTE — Progress Notes (Signed)
Patient transported to floor, patient's vitals flagged yellow on MEWS due to elevated oral temperature, Assessment completed, Charge RN Sharyn Lull) informed of patient's status, PRN medications given, Yellow MEWS Protocol initiated.

## 2019-07-20 NOTE — Progress Notes (Signed)
ANTICOAGULATION CONSULT NOTE - Follow Up Consult  Pharmacy Consult for heparin Indication: acute pulmonary embolus  Allergies  Allergen Reactions  . Nsaids Anaphylaxis  . Tape Rash    CLEAR PLASTIC TAPE    Patient Measurements: Height: 5\' 7"  (170.2 cm) Weight: 124.1 kg (273 lb 9.5 oz) IBW/kg (Calculated) : 61.6 Heparin Dosing Weight: 91 kg  Vital Signs: Temp: 99.2 F (37.3 C) (05/16 0107) Temp Source: Oral (05/15 2000) BP: 124/70 (05/16 0107) Pulse Rate: 78 (05/16 0107)  Labs: Recent Labs    07/18/19 0122 07/18/19 1257 07/18/19 1258 07/18/19 2159 07/19/19 0603 07/19/19 1828  HGB 13.9  --   --   --  11.9*  --   HCT 41.6  --   --   --  37.6  --   PLT 171  --   --   --  137*  --   APTT  --   --  126*  --   --   --   LABPROT  --   --  16.0*  --   --   --   INR  --   --  1.3*  --   --   --   HEPARINUNFRC  --   --   --  0.57 0.64 0.73*  CREATININE 0.97  --   --   --  1.17*  --   TROPONINIHS 15 17  --   --   --   --     Estimated Creatinine Clearance: 50.7 mL/min (A) (by C-G formula based on SCr of 1.17 mg/dL (H)).   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 At time of discharge she was noted to be SOB. Chest CTA showed "extensive pulmonary embolus" with right heart strain.  She's currently on heparin for VTE  - 5/5 LE doppler: neg for DVT  Today, 07/20/2019: - heparin level is therapeutic at 0.50 - cbc stable - per pt's RN, no bleeding noted - pulmonary recommends to transition to Eliquis at discharge and to be treated indefinitely  Goal of Therapy:  Heparin level 0.3-0.7 units/ml Monitor platelets by anticoagulation protocol: Yes   Plan:  - continue heparin drip at 1450 units/hr - daily heparin level - monitor for s/sx bleeding  Trudy Kory P 07/20/2019,3:48 AM

## 2019-07-21 ENCOUNTER — Ambulatory Visit (INDEPENDENT_AMBULATORY_CARE_PROVIDER_SITE_OTHER): Payer: Medicare Other | Admitting: *Deleted

## 2019-07-21 DIAGNOSIS — R600 Localized edema: Secondary | ICD-10-CM

## 2019-07-21 DIAGNOSIS — I442 Atrioventricular block, complete: Secondary | ICD-10-CM

## 2019-07-21 LAB — CBC
HCT: 37 % (ref 36.0–46.0)
Hemoglobin: 11.8 g/dL — ABNORMAL LOW (ref 12.0–15.0)
MCH: 30.3 pg (ref 26.0–34.0)
MCHC: 31.9 g/dL (ref 30.0–36.0)
MCV: 94.9 fL (ref 80.0–100.0)
Platelets: 181 10*3/uL (ref 150–400)
RBC: 3.9 MIL/uL (ref 3.87–5.11)
RDW: 13.6 % (ref 11.5–15.5)
WBC: 5.9 10*3/uL (ref 4.0–10.5)
nRBC: 0 % (ref 0.0–0.2)

## 2019-07-21 LAB — GLUCOSE, CAPILLARY
Glucose-Capillary: 83 mg/dL (ref 70–99)
Glucose-Capillary: 89 mg/dL (ref 70–99)
Glucose-Capillary: 107 mg/dL — ABNORMAL HIGH (ref 70–99)
Glucose-Capillary: 137 mg/dL — ABNORMAL HIGH (ref 70–99)

## 2019-07-21 LAB — HEPARIN LEVEL (UNFRACTIONATED): Heparin Unfractionated: 0.27 IU/mL — ABNORMAL LOW (ref 0.30–0.70)

## 2019-07-21 MED ORDER — AMOXICILLIN-POT CLAVULANATE 875-125 MG PO TABS: 1.0000 | ORAL_TABLET | Freq: Two times a day (BID) | ORAL | 0 refills | Status: AC

## 2019-07-21 MED ORDER — MUPIROCIN 2 % EX OINT
TOPICAL_OINTMENT | CUTANEOUS | Status: AC
  Filled 2019-07-21: qty 22

## 2019-07-21 MED ORDER — CHLORHEXIDINE GLUCONATE CLOTH 2 % EX PADS: 6.0000 | MEDICATED_PAD | Freq: Every day | CUTANEOUS | Status: DC

## 2019-07-21 MED ORDER — APIXABAN 5 MG PO TABS: 5.0000 mg | ORAL_TABLET | Freq: Two times a day (BID) | ORAL | 0 refills | Status: DC

## 2019-07-21 MED ORDER — APIXABAN 5 MG PO TABS: 10.0000 mg | ORAL_TABLET | Freq: Two times a day (BID) | ORAL | 0 refills | Status: DC

## 2019-07-21 MED ORDER — HEPARIN (PORCINE) 25000 UT/250ML-% IV SOLN
1650.0000 [IU]/h | INTRAVENOUS | Status: DC
  Administered 2019-07-21: 1650 [IU]/h via INTRAVENOUS

## 2019-07-21 MED ORDER — MUPIROCIN 2 % EX OINT
1.0000 "application " | TOPICAL_OINTMENT | Freq: Two times a day (BID) | CUTANEOUS | Status: DC
  Administered 2019-07-21: 1 via NASAL
  Filled 2019-07-21: qty 22

## 2019-07-21 MED ORDER — APIXABAN 5 MG PO TABS: 5.0000 mg | ORAL_TABLET | Freq: Two times a day (BID) | ORAL | Status: DC

## 2019-07-21 MED ORDER — APIXABAN 5 MG PO TABS
10.0000 mg | ORAL_TABLET | Freq: Two times a day (BID) | ORAL | Status: DC
  Administered 2019-07-21 (×2): 10 mg via ORAL
  Filled 2019-07-21 (×2): qty 2

## 2019-07-21 MED ORDER — APIXABAN (ELIQUIS) EDUCATION KIT FOR DVT/PE PATIENTS
PACK | Freq: Once | Status: AC
  Filled 2019-07-21: qty 1

## 2019-07-21 MED ORDER — SENNOSIDES-DOCUSATE SODIUM 8.6-50 MG PO TABS: 1.0000 | ORAL_TABLET | Freq: Every evening | ORAL | 0 refills | Status: AC | PRN

## 2019-07-21 MED ORDER — SENNOSIDES-DOCUSATE SODIUM 8.6-50 MG PO TABS
1.0000 | ORAL_TABLET | Freq: Once | ORAL | Status: AC
  Administered 2019-07-21: 1 via ORAL
  Filled 2019-07-21: qty 1

## 2019-07-21 MED ORDER — APIXABAN (ELIQUIS) VTE STARTER PACK (10MG AND 5MG)
ORAL_TABLET | ORAL | 0 refills | Status: DC
Start: 2019-07-21 — End: 2020-02-02

## 2019-07-21 NOTE — Care Management Important Message (Signed)
Important Message  Patient Details IM Letter given to Evette Cristal SW Case Manager to present to the Patient Name: [name] MRN: [id number] Date of Birth: [birth year]   Medicare Important Message Given:  Yes     Kerin Salen 07/21/2019, 11:54 AM

## 2019-07-21 NOTE — Progress Notes (Signed)
ANTICOAGULATION CONSULT NOTE - Follow Up Consult  Pharmacy Consult for heparin Indication: acute pulmonary embolus  Allergies  Allergen Reactions  . Nsaids Anaphylaxis  . Tape Rash    CLEAR PLASTIC TAPE    Patient Measurements: Height: 5\' 7"  (170.2 cm) Weight: 124.1 kg (273 lb 9.5 oz) IBW/kg (Calculated) : 61.6 Heparin Dosing Weight: 91 kg  Vital Signs: Temp: 100.4 F (38 C) (05/16 2041) Temp Source: Oral (05/16 2041) BP: 124/58 (05/16 2041) Pulse Rate: 67 (05/16 2041)  Labs: Recent Labs    07/18/19 1257 07/18/19 1258 07/18/19 2159 07/19/19 0603 07/19/19 0603 07/19/19 1828 07/20/19 0501 07/21/19 0337  HGB  --   --   --  11.9*   < >  --  11.7* 11.8*  HCT  --   --   --  37.6  --   --  36.4 37.0  PLT  --   --   --  137*  --   --  135* 181  APTT  --  126*  --   --   --   --   --   --   LABPROT  --  16.0*  --   --   --   --   --   --   INR  --  1.3*  --   --   --   --   --   --   HEPARINUNFRC  --   --    < > 0.64   < > 0.73* 0.50 0.27*  CREATININE  --   --   --  1.17*  --   --  1.09*  --   TROPONINIHS 17  --   --   --   --   --   --   --    < > = values in this interval not displayed.    Estimated Creatinine Clearance: 54.4 mL/min (A) (by C-G formula based on SCr of 1.09 mg/dL (H)).   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 At time of discharge she was noted to be SOB. Chest CTA showed "extensive pulmonary embolus" with right heart strain.  She's currently on heparin for VTE  - 5/5 LE doppler: neg for DVT  Today, 07/21/2019: - heparin level is sub-therapeutic at 0.27  - cbc stable - per pt's RN, no bleeding noted - pulmonary recommends to transition to Eliquis at discharge and to be treated indefinitely  Goal of Therapy:  Heparin level 0.3-0.7 units/ml Monitor platelets by anticoagulation protocol: Yes   Plan:  - increase heparin drip to 1650 units/hr -  check 8 hr heparin level - monitor for s/sx bleeding  Pham, Anh P 07/21/2019,4:40 AM

## 2019-07-21 NOTE — Discharge Summary (Addendum)
Physician Discharge Summary  [name] [id number] DOB: [birth year] DOA: 07/18/2019  PCP: Emily Ruff, MD  Admit date: 07/18/2019 Discharge date: 07/21/2019  Admitted From: Home Disposition:  Discharged to home.   Recommendations for Outpatient Follow-up:  1. Follow up with PCP in 1-2 weeks 2. Please obtain BMP/CBC in one week  Discharge Condition: Stable  CODE STATUS: DNR   Brief/Interim Summary: [name] a 84 y.o.femalewith medical history significant forhistory of ovarian cancer status post TSH BSO in 2012 also had [place] and Taxol chemo, breast cancer-status post lumpectomy in 2005, diabetes mellitus on insulin, hypertension, pacemaker in place, mitral stenosis/aortic stenosis,morbid obesity,[name] Patient also complains of dysuria and burning urine. She has history of perforated duodenal ulcer in 2020 and initially failed the pain similar to that. She also had associated nausea but no vomiting or diarrhea. Patient otherwise denies anyabdominal pain, focal weakness,speech difficulties [id number] systolic, heart rate in low 100, febrile 101.9,and also of up to 102.7.Placement with normal CMP, that is 11.1, troponin XV, 17 and lactic acid 1.4, UA was consistent with UTI.Patient was given ceftriaxone 1 g and she was about to be discharged when nursing noticed that she was somewhat dyspneic and needing oxygen confirmed with ABG where PO2 was 51, and underwent CTA that showed submassive PE and admission was requested  5/15: Remains on heparin gtt. BP ok off home meds. Watch vitals. Ok on heparin gtt for now. Spoke with her about eliquis/xarelto transition. Will need O2 at discharge. If she remains stable, look to make transition to oral AC in next 24- 48 hrs.  5/16: Had fever and some low pressures ON. Let's hold on transition to PO AC today. Reassess for tomorrow. If stable ON,  will make the transition to eliquis. She is otherwise ok.   5/17: She is ok for transition to eliquis. Will do that today. Will transition to augmentin for 2 more days. 6MWT completed. When she was taken off 4L [place] and placed on RA, she desatted to 84% prior to ambulation. She was transitioned to move, but never picked up her sats, so her [place] was restored and her O2 sats improved. She will need 4L [place] at discharge. PT will review patient. She is a patient with limited mobility d/t arthritis and I believe would benefit from some mobility therapy at home. Plan is to discharge to home today.    Discharge Diagnoses:  Active Problems:   Pulmonary emboli (HCC)  Extensive pulmonary embolus more severe in the right with right heart strain evidenced in the CT RV/LV ratio 1.4 Acute hypoxic respiratory failure needing supplemental oxygen - Unclear etiology but patient is sedentary barely mobile, has remote history of breast and ovarian cancer patient.  - COVID 19 is negative and had both covid vaccines last dose in march. - PCCM onboard, appreciate assistance - remains on heparin gtt and is hemodynamically stable - if she begins to decompensate, will look for tPa; as of right now though, plan is if she remains stable to transition to eliquis - she will need O2 at discharge - she will likely need AC for life - BLE dopplers negative     - 5/16: some fevers and low-normal BPs ON. Let's hold on transition to oral AC today. Will reasses in the morning.     - 5/17: transitioned to eliquis. Attempted 6MWT. Pt placed on RA and transitioned to move, but immediately dropped to 84%  O2 sat. She was unable to recover her sats til [place] returned. She will need to go home on 3 - 4L [place] at discharge. BP is actually good. She will go home on just her lasix. I have instructed her to check her BP twice daily, and d/w PCP about resuming her metoprolol and losartan as her BP picks up. Updated niece with  plan as well.   E coli UTI  - patient febrile - COVID-19 negative - UCx pos for e coli, sensitivies pending - continue rocephin     - 5/16: she's had 3 days rocephin. Transition to augmentin in AM.      - 5/17: Transitioned to augmentin through tomorrow  HLD - continue Lipitor  Diabetes mellitus on long-term insulin and Metformin - A1c 7.0 - SSI, levemir 55 units BID, DM-diet; follow     - 5/16: glucose ok, follow.  Hypertension - Blood pressure stable in the setting of submassive PE  - hold her metoprolol and losartan for now     - 5/16: continue holding metoprolol and losartan     - 5/17: BP good today. Hold metoprolol and losartan at discharge. See above.  Leg edema chronic - continue lasix at lower dose 40 mg daily - titrate up as vitals become more stable - BLE dopplers negative     - 5/16: lasix increased to home regimen, follow.  Status post TAVR in 2019, PPM  breast cancer-status post lumpectomy in 2005 history of ovarian cancer status post TSH BSO in 2012  - also had [place] and Taxol chemo   Severe arthritis w/ limited  Mobility Weakness     - PT to review  Discharge Instructions   Allergies as of 07/21/2019      Reactions   Nsaids Anaphylaxis   Tape Rash   CLEAR PLASTIC TAPE      Medication List    STOP taking these medications   acetaminophen 650 MG CR tablet Commonly known as: TYLENOL   amoxicillin 500 MG tablet Commonly known as: AMOXIL   losartan 50 MG tablet Commonly known as: COZAAR   metoprolol tartrate 25 MG tablet Commonly known as: LOPRESSOR     TAKE these medications   amoxicillin-clavulanate 875-125 MG tablet Commonly known as: AUGMENTIN Take 1 tablet by mouth every 12 (twelve) hours for 3 doses. Start this evening (07/21/19)   Apixaban Starter Pack (10mg  and 5mg ) Commonly known as: ELIQUIS STARTER PACK Take as directed on package: start with two-5mg  tablets twice  daily for 7 days. On day 8, switch to one-5mg  tablet twice daily.   atorvastatin 20 MG tablet Commonly known as: LIPITOR Take 20 mg by mouth at bedtime.   CALCIUM CITRATE + PO Take 1 tablet by mouth 2 (two) times daily.   cholecalciferol 1000 units tablet Commonly known as: VITAMIN D Take 1,000 Units by mouth 2 (two) times daily.   fish oil-omega-3 fatty acids 1000 MG capsule Take 1 g by mouth every evening.   FLUoxetine 40 MG capsule Commonly known as: PROZAC Take 40 mg by mouth daily.   furosemide 40 MG tablet Commonly known as: LASIX Take 40 mg (1 tab) by mouth twice daily every Mon., Wed, Fri., and Sun. Take 40 mg once daily on Tues., Thurs., and Sat.   Levemir FlexTouch 100 UNIT/ML FlexPen Generic drug: insulin detemir Inject 60-70 Units into the skin 2 (two) times daily. Per sliding scale   Magnesium 400 MG Caps Take 400 mg by mouth at bedtime.  metFORMIN 1000 MG tablet Commonly known as: GLUCOPHAGE Take 1,000 mg by mouth 2 (two) times daily with a meal.   multivitamin tablet Take 1 tablet by mouth daily.   pantoprazole 40 MG tablet Commonly known as: PROTONIX Take 1 tablet (40 mg total) by mouth 2 (two) times daily.   saccharomyces boulardii 250 MG capsule Commonly known as: Florastor Take 1 capsule (250 mg total) by mouth 2 (two) times daily. You can find a probiotic over the counter. Take this while you are on antibiotics.   senna-docusate 8.6-50 MG tablet Commonly known as: Senokot-S Take 1 tablet by mouth at bedtime as needed for up to 7 days for mild constipation or moderate constipation.            Durable Medical Equipment  (From admission, onward)         Start     Ordered   07/21/19 1113  For home use only DME oxygen  Once    Question Answer Comment  Length of Need 6 Months   Mode or (Route) Nasal cannula   Liters per Minute 4   Frequency Continuous (stationary and portable oxygen unit needed)   Oxygen conserving device Yes   Oxygen  delivery system Gas      07/21/19 1113         Follow-up Information    Emily Ruff, MD.   Specialty: [facility] Medicine Contact information: [place] [address] [phone]          Allergies  Allergen Reactions  . Nsaids Anaphylaxis  . Tape Rash    CLEAR PLASTIC TAPE    Consultations:  PCCM   Procedures/Studies: DG Chest 2 View  Result Date: 07/18/2019 CLINICAL DATA:  Fever and vomiting EXAM: CHEST - 2 VIEW COMPARISON:  03/22/2018 FINDINGS: Mild cardiomegaly. Unchanged position of left chest wall pacemaker. No focal airspace consolidation, pleural effusion or pulmonary edema. IMPRESSION: No active cardiopulmonary disease. Electronically Signed   By: Ulyses Jarred M.D.   On: 07/18/2019 02:09   CT Angio Chest PE W/Cm &/Or Wo Cm  Result Date: 07/18/2019 CLINICAL DATA:  Shortness of breath EXAM: CT ANGIOGRAPHY CHEST WITH CONTRAST TECHNIQUE: Multidetector CT imaging of the chest was performed using the standard protocol during bolus administration of intravenous contrast. Multiplanar CT image reconstructions and MIPs were obtained to evaluate the vascular anatomy. CONTRAST:  121mL OMNIPAQUE IOHEXOL 350 MG/ML SOLN COMPARISON:  Chest radiograph Jul 18, 2019; CT angiogram chest June 20, 2017; CT angiogram chest April 12, 2016 FINDINGS: Cardiovascular: There are multiple pulmonary emboli throughout both lower lobes as well as in the right upper lobe. Pulmonary embolus is seen in the distal most aspect of the left main pulmonary artery. The right ventricle to left ventricle diameter ratio is 1.4, indicative of right heart strain. There is no thoracic aortic aneurysm or dissection. There are scattered foci of calcification in visualized great vessels. There is aortic atherosclerosis. Pacemaker leads are attached to the right atrium and right ventricle. There is extensive mitral annulus calcification. There is a prosthetic aortic valve. No pericardial effusion  or pericardial thickening evident. Mediastinum/Nodes: There is a stable mass in the left thyroid containing calcification measuring 2.1 x 2.0 cm. A calcification along the inferior aspect of the left lobe of the thyroid measures 1.2 x 1.1 cm, stable. No new thyroid lesion evident. No evident thoracic adenopathy. No esophageal lesions are evident. Lungs/Pleura: There is mild bibasilar atelectasis. There is no appreciable edema or airspace opacity. No pleural  effusions are evident. Upper Abdomen: There is hepatic steatosis. There is a cyst arising from the left kidney, incompletely visualized, measuring 6.5 x 6.0 cm. Visualized upper abdominal structures otherwise are unremarkable. Musculoskeletal: There is degenerative change in the thoracic spine. No blastic or lytic bone lesions. No chest wall lesions. Review of the MIP images confirms the above findings. IMPRESSION: 1. Extensive pulmonary embolus, more severe on the right than on the left, with right heart strain. Positive for acute PE with CTevidence of right heart strain (RV/LV Ratio = 1.4) consistent with at least submassive (intermediate risk) PE. The presence of right heart strain has been associated with an increased risk of morbidity and mortality. 2. Aortic atherosclerosis. No aneurysm or dissection evident. Status post aortic valve replacement. Pacemaker leads attached to right atrium and right ventricle. 3.  Bibasilar atelectasis.  No airspace consolidation. 4.  No adenopathy. 5. Stable left lobe thyroid nodule. In the setting of significant comorbidities or limited life expectancy, no follow-up recommended. Clinical assessment with respect to potential further evaluation of this nodule advised. (Ref: J Am Coll Radiol. 2015 Feb;12(2): 143-50). 6.  Hepatic steatosis. Aortic Atherosclerosis (ICD10-I70.0). Critical Value/emergent results were called by telephone at the time of interpretation on 07/18/2019 at 11:30 am to provider Lacretia Leigh , who verbally  acknowledged these results. Electronically Signed   By: Lowella Grip III M.D.   On: 07/18/2019 11:31   ECHOCARDIOGRAM COMPLETE  Result Date: 07/19/2019    ECHOCARDIOGRAM REPORT   Patient Name:   ALIA NEISS Date of Exam: 07/19/2019 Medical Rec #:  [id number]     Height:       67.0 in Accession #:    [id number]    Weight:       277.2 lb Date of Birth:  [birth year]     BSA:          2.323 m Patient Age:    82 years      BP:           121/48 mmHg Patient Gender: F             HR:           88 bpm. Exam Location:  Inpatient Procedure: 2D Echo Indications:    Acute Respiratory Insufficiency R06.89  History:        Patient has prior history of Echocardiogram examinations, most                 recent 08/22/2017. Pacemaker, Aortic Valve Disease and Mitral                 Valve Disease; Risk Factors:Hypertension and Diabetes.                 Aortic Valve: 26 mm Edwards Sapien prosthetic, stented (TAVR)                 valve is present in the aortic position. Procedure Date:                 07/17/2017.  Sonographer:    Mikki Santee RDCS (AE) Referring Phys: [id number] [place] IMPRESSIONS  1. Abnormal septal motion ? from pacing . Left ventricular ejection fraction, by estimation, is 60 to 65%. The left ventricle has normal function. The left ventricle has no regional wall motion abnormalities. There is mild left ventricular hypertrophy. Left ventricular diastolic parameters are indeterminate.  2. Pacing wire in RA/RV . Right ventricular systolic function is normal. The right ventricular size is normal.  There is mildly elevated pulmonary artery systolic pressure.  3. Mean gradient across MV 11 mmHg but MVA by PT1/2 3.12 cm2 . The mitral valve is degenerative. Mild mitral valve regurgitation. No evidence of mitral stenosis.  4. Post TAVR with 26 mm Sapien 3 valve done 07/17/17 previous mean gradient 15 mmHg 06/24/19 trivial central AR no PVL Gradients stable since prior echo . The aortic valve has been  repaired/replaced. Aortic valve regurgitation is trivial. No aortic stenosis is present. There is a 26 mm Edwards Sapien prosthetic (TAVR) valve present in the aortic position. Procedure Date: 07/17/2017.  5. The inferior vena cava is dilated in size with >50% respiratory variability, suggesting right atrial pressure of 8 mmHg. FINDINGS  Left Ventricle: Abnormal septal motion ? from pacing. Left ventricular ejection fraction, by estimation, is 60 to 65%. The left ventricle has normal function. The left ventricle has no regional wall motion abnormalities. The left ventricular internal cavity size was normal in size. There is mild left ventricular hypertrophy. Left ventricular diastolic parameters are indeterminate. Right Ventricle: Pacing wire in RA/RV. The right ventricular size is normal. No increase in right ventricular wall thickness. Right ventricular systolic function is normal. There is mildly elevated pulmonary artery systolic pressure. The tricuspid regurgitant velocity is 2.67 m/s, and with an assumed right atrial pressure of 15 mmHg, the estimated right ventricular systolic pressure is [id number] mmHg. Left Atrium: Left atrial size was normal in size. Right Atrium: Right atrial size was normal in size. Pericardium: There is no evidence of pericardial effusion. Mitral Valve: Mean gradient across MV 11 mmHg but MVA by PT1/2 3.12 cm2. The mitral valve is degenerative in appearance. There is severe thickening of the mitral valve leaflet(s). There is severe calcification of the mitral valve leaflet(s). Normal mobility of the mitral valve leaflets. Severe mitral annular calcification. Mild mitral valve regurgitation. No evidence of mitral valve stenosis. MV peak gradient, 22.1 mmHg. The mean mitral valve gradient is 11.0 mmHg. Tricuspid Valve: The tricuspid valve is normal in structure. Tricuspid valve regurgitation is not demonstrated. No evidence of tricuspid stenosis. Aortic Valve: Post TAVR with 26 mm Sapien 3  valve done 07/17/17 previous mean gradient 15 mmHg 06/24/19 trivial central AR no PVL Gradients stable since prior echo. The aortic valve has been repaired/replaced. Aortic valve regurgitation is trivial. No aortic stenosis is present. Aortic valve mean gradient measures 15.5 mmHg. Aortic valve peak gradient measures 28.4 mmHg. Aortic valve area, by VTI measures 1.99 cm. There is a 26 mm Edwards Sapien prosthetic, stented (TAVR) valve present in the aortic position. Procedure Date: 07/17/2017. Pulmonic Valve: The pulmonic valve was normal in structure. Pulmonic valve regurgitation is not visualized. No evidence of pulmonic stenosis. Aorta: The aortic root is normal in size and structure. Venous: The inferior vena cava is dilated in size with greater than 50% respiratory variability, suggesting right atrial pressure of 8 mmHg. IAS/Shunts: No atrial level shunt detected by color flow Doppler.  LEFT VENTRICLE PLAX 2D LVIDd:         4.10 cm  Diastology LVIDs:         2.80 cm  LV e' lateral:   5.00 cm/s LV PW:         1.20 cm  LV E/e' lateral: 29.2 LV IVS:        1.20 cm  LV e' medial:    4.90 cm/s LVOT diam:     2.20 cm  LV E/e' medial:  29.8 LV SV:  98 LV SV Index:   42 LVOT Area:     3.80 cm  RIGHT VENTRICLE RV S prime:     12.50 cm/s TAPSE (M-mode): 1.7 cm LEFT ATRIUM           Index       RIGHT ATRIUM           Index LA diam:      3.30 cm 1.42 cm/m  RA Area:     14.30 cm LA Vol (A2C): 59.0 ml 25.40 ml/m RA Volume:   29.90 ml  12.87 ml/m LA Vol (A4C): 55.2 ml 23.76 ml/m  AORTIC VALVE AV Area (Vmax):    1.84 cm AV Area (Vmean):   1.89 cm AV Area (VTI):     1.99 cm AV Vmax:           266.50 cm/s AV Vmean:          184.500 cm/s AV VTI:            0.494 m AV Peak Grad:      28.4 mmHg AV Mean Grad:      15.5 mmHg LVOT Vmax:         129.00 cm/s LVOT Vmean:        91.500 cm/s LVOT VTI:          0.258 m LVOT/AV VTI ratio: 0.52  AORTA Ao Root diam: 2.80 cm MITRAL VALVE                TRICUSPID VALVE MV Area  (PHT): 3.12 cm     TR Peak grad:   28.5 mmHg MV Peak grad:  22.1 mmHg    TR Vmax:        267.00 cm/s MV Mean grad:  11.0 mmHg MV Vmax:       2.35 m/s     SHUNTS MV Vmean:      160.0 cm/s   Systemic VTI:  0.26 m MV Decel Time: 243 msec     Systemic Diam: 2.20 cm MV E velocity: 146.00 cm/s MV A velocity: 185.00 cm/s MV E/A ratio:  0.79 Jenkins Rouge MD Electronically signed by Jenkins Rouge MD Signature Date/Time: 07/19/2019/10:32:37 AM    Final    ECHOCARDIOGRAM COMPLETE  Result Date: 06/24/2019    ECHOCARDIOGRAM REPORT   Patient Name:   NIYLAH DEJULIO Date of Exam: 06/24/2019 Medical Rec #:  [id number]     Height:       67.0 in Accession #:    [id number]    Weight:       275.8 lb Date of Birth:  [birth year]     BSA:          2.318 m Patient Age:    58 years      BP:           126/60 mmHg Patient Gender: F             HR:           70 bpm. Exam Location:  [address] Procedure: 2D Echo, Cardiac Doppler and Color Doppler Indications:    Z95.2  History:        Patient has prior history of Echocardiogram examinations, most                 recent 08/22/2017. CHF, S/p TAVR (45mm Edwards Sapien); Risk                 Factors:Hypertension, Diabetes and Morbid obesity.  Aortic Valve: 26 mm Edwards Sapien prosthetic, stented (TAVR)                 valve is present in the aortic position. Procedure Date:                 07/17/2017.  Sonographer:    Coralyn Helling RDCS Referring Phys: [id number] [place]  1. Left ventricular ejection fraction, by estimation, is 60 to 65%. The left ventricle has normal function. The left ventricle has no regional wall motion abnormalities. There is moderate concentric left ventricular hypertrophy. Left ventricular diastolic function could not be evaluated.  2. Right ventricular systolic function is normal. The right ventricular size is normal. There is mildly elevated pulmonary artery systolic pressure. The estimated right ventricular systolic pressure is 123456  mmHg.  3. Left atrial size was mildly dilated.  4. MVA by continuity 1.74 cm2. The mitral valve is degenerative. No evidence of mitral valve regurgitation. Mild mitral stenosis. The mean mitral valve gradient is 6.0 mmHg with average heart rate of 67 bpm.  5. EOA 1.37 cm2. DI 0.37. Trivial paravalvular leak noted. The aortic valve has been repaired/replaced. Aortic valve regurgitation is trivial. There is a 26 mm Edwards Sapien prosthetic (TAVR) valve present in the aortic position. Procedure Date: 07/17/2017. Aortic valve mean gradient measures 15.0 mmHg. Aortic valve Vmax measures 2.64 m/s.  6. The inferior vena cava is normal in size with greater than 50% respiratory variability, suggesting right atrial pressure of 3 mmHg. Comparison(s): No significant change from prior study. Trivial PVL. Stable gradients across TAVR compared with prior. Mild mitral stenosis. FINDINGS  Left Ventricle: Left ventricular ejection fraction, by estimation, is 60 to 65%. The left ventricle has normal function. The left ventricle has no regional wall motion abnormalities. The left ventricular internal cavity size was normal in size. There is  moderate concentric left ventricular hypertrophy. Left ventricular diastolic function could not be evaluated due to mitral annular calcification (moderate or greater). Left ventricular diastolic function could not be evaluated. Right Ventricle: The right ventricular size is normal. No increase in right ventricular wall thickness. Right ventricular systolic function is normal. There is mildly elevated pulmonary artery systolic pressure. The tricuspid regurgitant velocity is 2.91  m/s, and with an assumed right atrial pressure of 3 mmHg, the estimated right ventricular systolic pressure is 123456 mmHg. Left Atrium: Left atrial size was mildly dilated. Right Atrium: Right atrial size was normal in size. Pericardium: Trivial pericardial effusion is present. Presence of pericardial fat pad. Mitral Valve:  MVA by continuity 1.74 cm2. The mitral valve is degenerative in appearance. Moderate to severe mitral annular calcification. No evidence of mitral valve regurgitation. Mild mitral valve stenosis. The mean mitral valve gradient is 6.0 mmHg with average heart rate of 67 bpm. Tricuspid Valve: The tricuspid valve is grossly normal. Tricuspid valve regurgitation is mild . No evidence of tricuspid stenosis. Aortic Valve: EOA 1.37 cm2. DI 0.37. Trivial paravalvular leak noted. The aortic valve has been repaired/replaced. Aortic valve regurgitation is trivial. Aortic valve mean gradient measures 15.0 mmHg. Aortic valve peak gradient measures 27.9 mmHg. Aortic  valve area, by VTI measures 1.37 cm. There is a 26 mm Edwards Sapien prosthetic, stented (TAVR) valve present in the aortic position. Procedure Date: 07/17/2017. Pulmonic Valve: The pulmonic valve was grossly normal. Pulmonic valve regurgitation is not visualized. No evidence of pulmonic stenosis. Aorta: The aortic root and ascending aorta are structurally normal, with no evidence of dilitation. Venous: The inferior  vena cava is normal in size with greater than 50% respiratory variability, suggesting right atrial pressure of 3 mmHg. IAS/Shunts: The atrial septum is grossly normal.  LEFT VENTRICLE PLAX 2D LVIDd:         4.50 cm  Diastology LVIDs:         3.10 cm  LV e' lateral:   4.68 cm/s LV PW:         1.30 cm  LV E/e' lateral: 30.8 LV IVS:        1.60 cm  LV e' medial:    6.53 cm/s LVOT diam:     2.20 cm  LV E/e' medial:  22.1 LV SV:         84 LV SV Index:   36 LVOT Area:     3.80 cm  RIGHT VENTRICLE             IVC RV S prime:     12.70 cm/s  IVC diam: 2.00 cm TAPSE (M-mode): 2.6 cm RVSP:           36.9 mmHg LEFT ATRIUM              Index       RIGHT ATRIUM           Index LA diam:        4.20 cm  1.81 cm/m  RA Pressure: 3.00 mmHg LA Vol (A2C):   101.0 ml 43.57 ml/m RA Area:     19.60 cm LA Vol (A4C):   73.0 ml  31.49 ml/m RA Volume:   57.00 ml  24.59 ml/m  LA Biplane Vol: 88.5 ml  38.18 ml/m  AORTIC VALVE AV Area (Vmax):    1.39 cm AV Area (Vmean):   1.58 cm AV Area (VTI):     1.37 cm AV Vmax:           264.00 cm/s AV Vmean:          174.000 cm/s AV VTI:            0.608 m AV Peak Grad:      27.9 mmHg AV Mean Grad:      15.0 mmHg LVOT Vmax:         96.20 cm/s LVOT Vmean:        72.300 cm/s LVOT VTI:          0.220 m LVOT/AV VTI ratio: 0.36  AORTA Ao Root diam: 3.40 cm Ao Asc diam:  3.50 cm MITRAL VALVE                TRICUSPID VALVE MV Area (PHT): 2.82 cm     TR Peak grad:   33.9 mmHg MV Mean grad:  6.0 mmHg     TR Vmax:        291.00 cm/s MV VTI:        0.48 m       Estimated RAP:  3.00 mmHg MV Decel Time: 269 msec     RVSP:           36.9 mmHg MV E velocity: 144.00 cm/s MV A velocity: 182.00 cm/s  SHUNTS MV E/A ratio:  0.79         Systemic VTI:  0.22 m                             Systemic Diam: 2.20 cm Eleonore Chiquito MD Electronically signed by Eleonore Chiquito MD Signature Date/Time:  06/24/2019/3:30:34 PM    Final    VAS [place] LOWER EXTREMITY VENOUS (DVT)  Result Date: 07/20/2019  Lower Venous DVTStudy Indications: Swelling.  Limitations: Body habitus and poor ultrasound/tissue interface. Comparison Study: none Performing Technologist: June Leap RDMS, RVT  Examination Guidelines: A complete evaluation includes B-mode imaging, spectral Doppler, color Doppler, and power Doppler as needed of all accessible portions of each vessel. Bilateral testing is considered an integral part of a complete examination. Limited examinations for reoccurring indications may be performed as noted. The reflux portion of the exam is performed with the patient in reverse Trendelenburg.  +---------+---------------+---------+-----------+----------+-------------------+ RIGHT    CompressibilityPhasicitySpontaneityPropertiesThrombus Aging      +---------+---------------+---------+-----------+----------+-------------------+ CFV      Full           Yes      Yes                                       +---------+---------------+---------+-----------+----------+-------------------+ SFJ      Full                                                             +---------+---------------+---------+-----------+----------+-------------------+ FV Prox  Full                                                             +---------+---------------+---------+-----------+----------+-------------------+ FV Mid   Full                                                             +---------+---------------+---------+-----------+----------+-------------------+ FV DistalFull                                                             +---------+---------------+---------+-----------+----------+-------------------+ PFV      Full                                                             +---------+---------------+---------+-----------+----------+-------------------+ POP      Full           Yes      Yes                  not well visualized +---------+---------------+---------+-----------+----------+-------------------+ PTV      Full                                                             +---------+---------------+---------+-----------+----------+-------------------+  PERO     Full                                         not well visualized +---------+---------------+---------+-----------+----------+-------------------+   +---------+---------------+---------+-----------+----------+-------------------+ LEFT     CompressibilityPhasicitySpontaneityPropertiesThrombus Aging      +---------+---------------+---------+-----------+----------+-------------------+ CFV      Full           Yes      Yes                                      +---------+---------------+---------+-----------+----------+-------------------+ SFJ      Full                                                              +---------+---------------+---------+-----------+----------+-------------------+ FV Prox  Full                                                             +---------+---------------+---------+-----------+----------+-------------------+ FV Mid   Full                                                             +---------+---------------+---------+-----------+----------+-------------------+ FV DistalFull                                                             +---------+---------------+---------+-----------+----------+-------------------+ PFV      Full                                                             +---------+---------------+---------+-----------+----------+-------------------+ POP      Full           Yes      Yes                  not well visualized +---------+---------------+---------+-----------+----------+-------------------+ PTV      Full                                                             +---------+---------------+---------+-----------+----------+-------------------+ PERO     Full  not well visualized +---------+---------------+---------+-----------+----------+-------------------+     Summary: BILATERAL: - No evidence of deep vein thrombosis seen in the lower extremities, bilaterally. -No evidence of popliteal cyst, bilaterally.   *See table(s) above for measurements and observations. Electronically signed by Ruta Hinds MD on 07/20/2019 at 8:52:48 AM.    Final       Subjective: "You mean I could leave today?"  Discharge Exam: Vitals:   07/21/19 0533 07/21/19 1100  BP: (!) 125/57   Pulse: 68   Resp: 18   Temp: 98.2 F (36.8 C)   SpO2: 98% (!) 88%   Vitals:   07/20/19 1237 07/20/19 2041 07/21/19 0533 07/21/19 1100  BP: (!) 117/52 (!) 124/58 (!) 125/57   Pulse: 72 67 68   Resp: 17 17 18    Temp: 98.5 F (36.9 C) (!) 100.4 F (38 C) 98.2 F (36.8 C)   TempSrc: Oral Oral Oral    SpO2: 97% 99% 98% (!) 88%  Weight:      Height:       General: 84 y.o. female resting in bed in NAD Cardiovascular: RRR, +S1, S2, no m/g/r, equal pulses throughout Respiratory: CTABL, no w/r/r, normal WOB GI: BS+, NDNT, no masses noted, no organomegaly noted MSK: No e/c/c Neuro: A&O x 3, no focal deficits Psyc: Appropriate interaction and affect, calm/cooperative   The results of significant diagnostics from this hospitalization (including imaging, microbiology, ancillary and laboratory) are listed below for reference.     Microbiology: Recent Results (from the past 240 hour(s))  Blood culture (routine x 2)     Status: None (Preliminary result)   Collection Time: 07/18/19  1:23 AM   Specimen: BLOOD  Result Value Ref Range Status   Specimen Description   Final    BLOOD RIGHT ANTECUBITAL Performed at [facility] [address] [place], [place] [address]    Special Requests   Final    BOTTLES DRAWN AEROBIC AND ANAEROBIC Blood Culture results may not be optimal due to an inadequate volume of blood received in culture bottles Performed at [facility] [address] [place], [place] [address]    Culture   Final    NO GROWTH 3 DAYS Performed at [facility], [place] [address] [place], [place] [address]    Report Status PENDING  Incomplete  Blood culture (routine x 2)     Status: None (Preliminary result)   Collection Time: 07/18/19  1:28 AM   Specimen: BLOOD RIGHT FOREARM  Result Value Ref Range Status   Specimen Description BLOOD RIGHT FOREARM  Final   Special Requests   Final    BOTTLES DRAWN AEROBIC AND ANAEROBIC Blood Culture results may not be optimal due to an inadequate volume of blood received in culture bottles Performed at [facility], [place] [address] [place], [place] [address]    Culture   Final    NO GROWTH 3 DAYS Performed at [facility], [place] [address], [place], [place] [address]     Report Status PENDING  Incomplete  Urine culture     Status: Abnormal   Collection Time: 07/18/19  5:47 AM   Specimen: Urine, Random  Result Value Ref Range Status   Specimen Description   Final    URINE, RANDOM Performed at [facility] [address], [place], [place] [address]    Special Requests   Final    NONE Performed at [facility], [address] [place], [place] [address]    Culture >=100,000 COLONIES/mL ESCHERICHIA COLI (A)  Final   Report Status 07/20/2019 FINAL  Final   Organism ID, Bacteria ESCHERICHIA COLI (A)  Final      Susceptibility   Escherichia coli - MIC*    AMPICILLIN 4 SENSITIVE Sensitive     CEFAZOLIN <=4 SENSITIVE Sensitive     CEFTRIAXONE <=1 SENSITIVE Sensitive     CIPROFLOXACIN <=0.25 SENSITIVE Sensitive     GENTAMICIN <=1 SENSITIVE Sensitive     IMIPENEM <=0.25 SENSITIVE Sensitive     NITROFURANTOIN <=16 SENSITIVE Sensitive     TRIMETH/SULFA <=20 SENSITIVE Sensitive     AMPICILLIN/SULBACTAM <=2 SENSITIVE Sensitive     PIP/TAZO <=4 SENSITIVE Sensitive     * >=100,000 COLONIES/mL ESCHERICHIA COLI  SARS Coronavirus 2 by RT PCR (hospital order, performed in [facility] hospital lab) Nasopharyngeal Nasopharyngeal Swab     Status: None   Collection Time: 07/18/19 12:58 PM   Specimen: Nasopharyngeal Swab  Result Value Ref Range Status   SARS Coronavirus 2 NEGATIVE NEGATIVE Final    Comment: (NOTE) SARS-CoV-2 target nucleic acids are NOT DETECTED. The SARS-CoV-2 RNA is generally detectable in upper and lower respiratory specimens during the acute phase of infection. The lowest concentration of SARS-CoV-2 viral copies this assay can detect is 250 copies / mL. A negative result does not preclude SARS-CoV-2 infection and should not be used as the sole basis for treatment or other patient management decisions.  A negative result may occur with improper specimen collection / handling, submission of specimen  other than nasopharyngeal swab, presence of viral mutation(s) within the areas targeted by this assay, and inadequate number of viral copies (<250 copies / mL). A negative result must be combined with clinical observations, patient history, and epidemiological information. Fact Sheet for Patients:   StrictlyIdeas.no Fact Sheet for Healthcare Providers: BankingDealers.co.za This test is not yet approved or cleared  by the [place] FDA and has been authorized for detection and/or diagnosis of SARS-CoV-2 by FDA under an Emergency Use Authorization (EUA).  This EUA will remain in effect (meaning this test can be used) for the duration of the COVID-19 declaration under Section 564(b)(1) of the Act, 21 U.S.C. section 360bbb-3(b)(1), unless the authorization is terminated or revoked sooner. Performed at [facility], [place] [address], [place], [place] [address]   MRSA PCR Screening     Status: Abnormal   Collection Time: 07/19/19  6:37 AM   Specimen: Nasal Mucosa; Nasopharyngeal  Result Value Ref Range Status   MRSA by PCR POSITIVE (A) NEGATIVE Final    Comment:        The GeneXpert MRSA Assay (FDA approved for NASAL specimens only), is one component of a comprehensive MRSA colonization surveillance program. It is not intended to diagnose MRSA infection nor to guide or monitor treatment for MRSA infections. RESULT CALLED TO, READ BACK BY AND VERIFIED WITH: M.FAULK,RN [id number] @1228  BY V.WILKINS Performed at [facility] [address] [place], [place] [address]      Labs: BNP (last 3 results) Recent Labs    07/18/19 1258  BNP [id number]*   Basic Metabolic Panel: Recent Labs  Lab 07/18/19 0122 07/19/19 0603 07/20/19 0501  NA 135 135 137  K 4.3 4.0 3.7  CL 98 101 102  CO2 25 24 27   GLUCOSE 210* 148* 105*  BUN 21 25* 22  CREATININE 0.97 1.17* 1.09*  CALCIUM 8.9 8.3* 8.1*  MG  --   --  2.2   Liver Function Tests: Recent Labs  Lab 07/18/19 0122 07/19/19 0603 07/20/19 0501  AST 18 17 25   ALT 16 15 22   ALKPHOS 68 55 63  BILITOT 1.2 1.0 0.7  PROT 7.6 6.6 6.3*  ALBUMIN 4.1 3.4* 3.1*   Recent Labs  Lab 07/18/19 0122  LIPASE 21   No results for input(s): AMMONIA in the last 168 hours. CBC: Recent Labs  Lab 07/18/19 0122 07/19/19 0603 07/20/19 0501 07/21/19 0337  WBC 11.1* 8.7 5.6 5.9  NEUTROABS 8.6*  --   --   --   HGB 13.9 11.9* 11.7* 11.8*  HCT 41.6 37.6 36.4 37.0  MCV 95.2 96.7 95.5 94.9  PLT 171 137* 135* 181   Cardiac Enzymes: No results for input(s): CKTOTAL, CKMB, CKMBINDEX, TROPONINI in the last 168 hours. BNP: Invalid input(s): POCBNP CBG: Recent Labs  Lab 07/20/19 0757 07/20/19 1210 07/20/19 1713 07/20/19 2103 07/21/19 0724  GLUCAP 98 94 96 92 83   D-Dimer No results for input(s): DDIMER in the last 72 hours. Hgb A1c Recent Labs    07/18/19 1627  HGBA1C 7.0*   Lipid Profile No results for input(s): CHOL, HDL, LDLCALC, TRIG, CHOLHDL, LDLDIRECT in the last 72 hours. Thyroid function studies No results for input(s): TSH, T4TOTAL, T3FREE, THYROIDAB in the last 72 hours.  Invalid input(s): FREET3 Anemia work up No results for input(s): VITAMINB12, FOLATE, FERRITIN, TIBC, IRON, RETICCTPCT in the last 72 hours. Urinalysis    Component Value Date/Time   COLORURINE YELLOW 07/18/2019 0542   APPEARANCEUR CLOUDY (A) 07/18/2019 0542   LABSPEC 1.014 07/18/2019 0542   PHURINE 5.0 07/18/2019 0542   GLUCOSEU NEGATIVE 07/18/2019 0542   HGBUR MODERATE (A) 07/18/2019 0542   BILIRUBINUR NEGATIVE 07/18/2019 0542   KETONESUR 20 (A) 07/18/2019 0542   PROTEINUR 100 (A) 07/18/2019 0542   UROBILINOGEN 0.2 12/28/2007 0222   NITRITE POSITIVE (A) 07/18/2019 0542   LEUKOCYTESUR LARGE (A) 07/18/2019 0542   Sepsis Labs Invalid input(s): PROCALCITONIN,  WBC,  LACTICIDVEN Microbiology Recent Results (from the past 240 hour(s))  Blood culture  (routine x 2)     Status: None (Preliminary result)   Collection Time: 07/18/19  1:23 AM   Specimen: BLOOD  Result Value Ref Range Status   Specimen Description   Final    BLOOD RIGHT ANTECUBITAL Performed at [facility], [place] [address], [place], [place] [address]    Special Requests   Final    BOTTLES DRAWN AEROBIC AND ANAEROBIC Blood Culture results may not be optimal due to an inadequate volume of blood received in culture bottles Performed at [facility] [address], [place], [place] [address]    Culture   Final    NO GROWTH 3 DAYS Performed at [facility], [place] [address], [place], [place] [address]    Report Status PENDING  Incomplete  Blood culture (routine x 2)     Status: None (Preliminary result)   Collection Time: 07/18/19  1:28 AM   Specimen: BLOOD RIGHT FOREARM  Result Value Ref Range Status   Specimen Description BLOOD RIGHT FOREARM  Final   Special Requests   Final    BOTTLES DRAWN AEROBIC AND ANAEROBIC Blood Culture results may not be optimal due to an inadequate volume of blood received in culture bottles Performed at [facility], [place] [address], [place], [place] [address]    Culture   Final    NO GROWTH 3 DAYS Performed at [facility], [place] [address] [place], [place] [address]    Report Status PENDING  Incomplete  Urine culture     Status: Abnormal   Collection Time: 07/18/19  5:47 AM   Specimen: Urine, Random  Result Value Ref Range Status   Specimen Description   Final    URINE, RANDOM Performed at [facility] [address] [place], [place] [address]    Special Requests   Final    NONE Performed at [facility], [place] [address], [place], [place] [address]    Culture >=100,000 COLONIES/mL ESCHERICHIA COLI (A)  Final   Report Status 07/20/2019 FINAL  Final   Organism ID, Bacteria ESCHERICHIA COLI (A)  Final      Susceptibility    Escherichia coli - MIC*    AMPICILLIN 4 SENSITIVE Sensitive     CEFAZOLIN <=4 SENSITIVE Sensitive     CEFTRIAXONE <=1 SENSITIVE Sensitive     CIPROFLOXACIN <=0.25 SENSITIVE Sensitive     GENTAMICIN <=1 SENSITIVE Sensitive     IMIPENEM <=0.25 SENSITIVE Sensitive     NITROFURANTOIN <=16 SENSITIVE Sensitive     TRIMETH/SULFA <=20 SENSITIVE Sensitive     AMPICILLIN/SULBACTAM <=2 SENSITIVE Sensitive     PIP/TAZO <=4 SENSITIVE Sensitive     * >=100,000 COLONIES/mL ESCHERICHIA COLI  SARS Coronavirus 2 by RT PCR (hospital order, performed in [facility] hospital lab) Nasopharyngeal Nasopharyngeal Swab     Status: None   Collection Time: 07/18/19 12:58 PM   Specimen: Nasopharyngeal Swab  Result Value Ref Range Status   SARS Coronavirus 2 NEGATIVE NEGATIVE Final    Comment: (NOTE) SARS-CoV-2 target nucleic acids are NOT DETECTED. The SARS-CoV-2 RNA is generally detectable in upper and lower respiratory specimens during the acute phase of infection. The lowest concentration of SARS-CoV-2 viral copies this assay can detect is 250 copies / mL. A negative result does not preclude SARS-CoV-2 infection and should not be used as the sole basis for treatment or other patient management decisions.  A negative result may occur with improper specimen collection / handling, submission of specimen other than nasopharyngeal swab, presence of viral mutation(s) within the areas targeted by this assay, and inadequate number of viral copies (<250 copies / mL). A negative result must be combined with clinical observations, patient history, and epidemiological information. Fact Sheet for Patients:   StrictlyIdeas.no Fact Sheet for Healthcare Providers: BankingDealers.co.za This test is not yet approved or cleared  by the [place] FDA and has been authorized for detection and/or diagnosis of SARS-CoV-2 by FDA under an Emergency Use Authorization (EUA).  This  EUA will remain in effect (meaning this test can be used) for the duration of the COVID-19 declaration under Section 564(b)(1) of the Act, 21 U.S.C. section 360bbb-3(b)(1), unless the authorization is terminated or revoked sooner. Performed at [facility], [place] [address], [place], [place] [address]   MRSA PCR Screening     Status: Abnormal   Collection Time: 07/19/19  6:37 AM   Specimen: Nasal Mucosa; Nasopharyngeal  Result Value Ref Range Status   MRSA by PCR POSITIVE (A) NEGATIVE Final    Comment:        The GeneXpert MRSA Assay (FDA approved for NASAL specimens only), is one component of a comprehensive MRSA colonization surveillance program. It is not intended to diagnose MRSA infection nor to guide or monitor treatment for MRSA infections. RESULT CALLED TO, READ BACK BY AND VERIFIED WITH: M.FAULK,RN [id number] @1228  BY V.WILKINS Performed  at [facility], [place] [address] [place], [place] [address]      Time coordinating discharge: 35 minutes  SIGNED:   Jonnie Finner, DO  [facility] 07/21/2019, 11:14 AM   If 7PM-7AM, please contact night-coverage [email or web address]

## 2019-07-21 NOTE — TOC Benefit Eligibility Note (Signed)
Transition of Care [facility]) Benefit Eligibility Note    Patient Details  Name: [name] MRN: [id number] Date of Birth: 26-Dec-1935   Medication/Dose: Elisquis starter pack  Covered?: Yes     Prescription Coverage Preferred Pharmacy: local  Spoke with Person/Company/Phone Number:: Charlie/ [employer] [phone]  Co-Pay: unable to give copay Patient in [place] will have to be ran through local pharmacy and she is responsible for 25% of cost        Additional Notes: Rep. states Patient in [place] will not let her run the Eliquis to get an estimated cost    Kerin Salen Phone Number: 07/21/2019, 5:19 PM

## 2019-07-21 NOTE — Evaluation (Signed)
Physical Therapy Evaluation Patient Details Name: [name] MRN: [id number] DOB: 10-11-1935 Today's Date: 07/21/2019   History of Present Illness  [name] is a 84 y.o. female with medical history significant for history of ovarian cancer status post TSH BSO in 2012 also had [place] and Taxol chemo, breast cancer-status post lumpectomy in 2005, diabetes mellitus on insulin, hypertension, pacemaker in place, mitral stenosis/aortic stenosis, morbid obesity, [name]  Patient also complains of dysuria and burning urine.  She has history of perforated duodenal ulcer . Patient positive for PE.  Clinical Impression  Patient requires assistance  With bed mobility, reports sleeps in a lift chair recently and limited ambulation with rollator. Patient currently will have difficulty negotiating 4 steps so recommend nonemergency EMS transportation. Patient  Did ambulate 5 ' x 3 on RA with SPO2 dropping to 875. On 3 L  At rest 955. Patient has 2 sisters at home who will assist. Pt admitted with above diagnosis.  Pt currently with functional limitations due to the deficits listed below (see PT Problem List). Pt will benefit from skilled PT to increase their independence and safety with mobility to allow discharge to the venue listed below.       Follow Up Recommendations Home health PT;Supervision/Assistance - 24 hour    Equipment Recommendations  None recommended by PT    Recommendations for Other Services       Precautions / Restrictions Precautions Precautions: Fall Precaution Comments: monitor      Mobility  Bed Mobility Overal bed mobility: Needs Assistance Bed Mobility: Supine to Sit     Supine to sit: Min guard     General bed mobility comments: extra time to get legs to bed edge and for trunk to rise.  Transfers Overall transfer level: Needs assistance Equipment used: Rolling walker (2  wheeled) Transfers: Sit to/from Stand Sit to Stand: Mod assist         General transfer comment: stands x 1 from bed and x 2 from reclojer, moves slowly to rise,  Ambulation/Gait Ambulation/Gait assistance: Min assist Gait Distance (Feet): 5 Feet Assistive device: Rolling walker (2 wheeled) Gait Pattern/deviations: Step-to pattern Gait velocity: decr   General Gait Details: 5' to recliner then 5' forward  and backward x 2.  Stairs            Wheelchair Mobility    Modified Rankin (Stroke Patients Only)       Balance Overall balance assessment: Needs assistance Sitting-balance support: Feet supported;No upper extremity supported Sitting balance-Leahy Scale: Good     Standing balance support: During functional activity;Bilateral upper extremity supported Standing balance-Leahy Scale: Poor Standing balance comment: reliant on UE's                             Pertinent Vitals/Pain Pain Assessment: Faces Faces Pain Scale: Hurts little more Pain Location: knees Pain Descriptors / Indicators: Aching Pain Intervention(s): Monitored during session    Home Living Family/patient expects to be discharged to:: Private residence Living Arrangements: Other relatives Available Help at Discharge: Family Type of Home: House Home Access: Stairs to enter Entrance Stairs-Rails: Psychiatric nurse of Steps: 4 Home Layout: One level Home Equipment: Environmental consultant - 4 wheels;Transport chair Additional Comments: chair lift    Prior Function Level of Independence: Independent with assistive device(s)         Comments: uses rollator in house,  recently min. ambulation     Hand Dominance   Dominant Hand: Right    Extremity/Trunk Assessment   Upper Extremity Assessment Upper Extremity Assessment: Overall WFL for tasks assessed    Lower Extremity Assessment Lower Extremity Assessment: Generalized weakness(audible crepitus.)    Cervical / Trunk  Assessment Cervical / Trunk Assessment: Normal  Communication   Communication: No difficulties  Cognition Arousal/Alertness: Awake/alert Behavior During Therapy: WFL for tasks assessed/performed Overall Cognitive Status: Within Functional Limits for tasks assessed                                        General Comments      Exercises     Assessment/Plan    PT Assessment Patient needs continued PT services  PT Problem List Decreased strength;Decreased activity tolerance;Decreased balance;Decreased knowledge of use of DME;Decreased mobility;Decreased knowledge of precautions       PT Treatment Interventions DME instruction;Therapeutic activities;Gait training;Therapeutic exercise;Patient/family education;Functional mobility training    PT Goals (Current goals can be found in the Care Plan section)  Acute Rehab PT Goals Patient Stated Goal: to go home PT Goal Formulation: With patient Time For Goal Achievement: 08/04/19 Potential to Achieve Goals: Good    Frequency Min 3X/week   Barriers to discharge        Co-evaluation               AM-PAC PT "6 Clicks" Mobility  Outcome Measure Help needed turning from your back to your side while in a flat bed without using bedrails?: A Lot Help needed moving from lying on your back to sitting on the side of a flat bed without using bedrails?: A Lot Help needed moving to and from a bed to a chair (including a wheelchair)?: A Lot Help needed standing up from a chair using your arms (e.g., wheelchair or bedside chair)?: A Lot Help needed to walk in hospital room?: A Lot Help needed climbing 3-5 steps with a railing? : Total 6 Click Score: 11    End of Session Equipment Utilized During Treatment: Gait belt Activity Tolerance: Patient limited by fatigue Patient left: in chair;with call bell/phone within reach;with chair alarm set Nurse Communication: Mobility status PT Visit Diagnosis: Unsteadiness on feet  (R26.81);Muscle weakness (generalized) (M62.81)    Time: [id number] PT Time Calculation (min) (ACUTE ONLY): 58 min   Charges:   PT Evaluation $PT Eval Moderate Complexity: 1 Mod PT Treatments $Gait Training: 8-22 mins $Therapeutic Activity: 8-22 mins $Self Care/Home Management: [place] Pager [phone] Office [phone]   Claretha Cooper 07/21/2019, 3:30 PM

## 2019-07-21 NOTE — TOC Transition Note (Addendum)
Transition of Care [facility]) - CM/SW Discharge Note   Patient Details  Name: [name] MRN: [id number] Date of Birth: [birth year]  Transition of Care [facility]) CM/SW Contact:  Ross Ludwig, LCSW Phone Number: 07/21/2019, 3:15pm   Clinical Narrative:     Patient is an 84 year old female who is alert and oriented x4.  Patient states that she has had home health in the past.  She has had it with [facility] and would like to use them again if possible.  Patient is also a new oxygen patient.  Patient would like to return home with home health Pt and RN.  CSW contacted [facility] and they can accept her for home health.  CSW spoke to patient since she is a new oxygen, her sister is at the house and can accept patient.  Patient will be going home with home health through [place] and support at home. CSW signing off please reconsult with any other social work needs, home health agency has been notified of planned discharge.   Final next level of care: [place] Barriers to Discharge: Barriers Resolved   Patient Goals and CMS Choice Patient states their goals for this hospitalization and ongoing recovery are:: To return bacck home with home heatlh services. CMS Medicare.gov Compare Post Acute Care list provided to:: Patient Choice offered to / list presented to : Patient  Discharge Placement  Patient discharging home with oxygen through [place].           Discharge Plan and Services     Post Acute Care Choice: Home Health          DME Arranged: Oxygen DME Agency: [employer] Date DME Agency Contacted: 07/21/19 Time DME Agency Contacted: 40 Representative spoke with at DME Agency: Thedore [facility] HH Arranged: RN, PT [place] Agency: [place] Date [place]: 07/21/19 Time [place]: 1400 Representative spoke with at [place]: [place] ([place]) Interventions     Readmission Risk Interventions No flowsheet  data found.

## 2019-07-21 NOTE — Plan of Care (Signed)
  Problem: Clinical Measurements: Goal: Ability to maintain clinical measurements within normal limits will improve Outcome: Adequate for Discharge Goal: Will remain free from infection Outcome: Adequate for Discharge Goal: Diagnostic test results will improve Outcome: Adequate for Discharge Goal: Respiratory complications will improve Outcome: Adequate for Discharge Goal: Cardiovascular complication will be avoided Outcome: Adequate for Discharge   Problem: Activity: Goal: Risk for activity intolerance will decrease Outcome: Adequate for Discharge   Problem: Elimination: Goal: Will not experience complications related to bowel motility Outcome: Adequate for Discharge Goal: Will not experience complications related to urinary retention Outcome: Adequate for Discharge

## 2019-07-22 LAB — CUP PACEART REMOTE DEVICE CHECK
Battery Remaining Longevity: 120 mo
Battery Remaining Percentage: 95.5 %
Battery Voltage: 2.99 V
Brady Statistic AP VP Percent: 7.7 %
Brady Statistic AP VS Percent: 1 %
Brady Statistic AS VP Percent: 91 %
Brady Statistic AS VS Percent: 1 %
Brady Statistic RA Percent Paced: 6.4 %
Brady Statistic RV Percent Paced: 99 %
Date Time Interrogation Session: 20210518015022
Implantable Lead Implant Date: 20180216
Implantable Lead Implant Date: 20180216
Implantable Lead Location: 753859
Implantable Lead Location: 753860
Implantable Pulse Generator Implant Date: 20180216
Lead Channel Impedance Value: 430 Ohm
Lead Channel Impedance Value: 440 Ohm
Lead Channel Pacing Threshold Amplitude: 0.5 V
Lead Channel Pacing Threshold Amplitude: 0.875 V
Lead Channel Pacing Threshold Pulse Width: 0.5 ms
Lead Channel Pacing Threshold Pulse Width: 0.5 ms
Lead Channel Sensing Intrinsic Amplitude: 12 mV
Lead Channel Sensing Intrinsic Amplitude: 3.5 mV
Lead Channel Setting Pacing Amplitude: 1.125
Lead Channel Setting Pacing Amplitude: 2 V
Lead Channel Setting Pacing Pulse Width: 0.5 ms
Lead Channel Setting Sensing Sensitivity: 2.5 mV
Pulse Gen Model: 2272
Pulse Gen Serial Number: 7998131

## 2019-07-23 ENCOUNTER — Other Ambulatory Visit: Payer: Self-pay | Admitting: *Deleted

## 2019-07-23 LAB — CULTURE, BLOOD (ROUTINE X 2)
Culture: NO GROWTH
Culture: NO GROWTH

## 2019-07-23 NOTE — Patient Outreach (Signed)
[facility]) Care Management  07/23/2019  [name] 1935/03/27 [id number]   Telephone Assessment (Call [place] call)  RN returned a call to pt and introduced [facility] services. Pt inquiring on the provided home O2 tanks that were delivered. Pt states she did not receive any instructions on how to use all the supplies for this home O2.  Pt informed [facility] does not provide this DME however can assist pt with contact the agency involved for further instructions on the use of this equipment. Pt states declined the assistance and indicated she would call. No other needs as RN educated pt on [facility] services that can be provided. No other inquired or request at this time. Pt grateful as the call ended.  Plan: Will close this case with no additional assistance needed at this time.   Raina Mina, RN Care Management Coordinator [place] Office [phone]

## 2019-07-23 NOTE — Progress Notes (Signed)
Remote pacemaker transmission.   

## 2019-07-24 DIAGNOSIS — M479 Spondylosis, unspecified: Secondary | ICD-10-CM | POA: Diagnosis not present

## 2019-07-24 DIAGNOSIS — N281 Cyst of kidney, acquired: Secondary | ICD-10-CM | POA: Diagnosis not present

## 2019-07-24 DIAGNOSIS — E119 Type 2 diabetes mellitus without complications: Secondary | ICD-10-CM | POA: Diagnosis not present

## 2019-07-24 DIAGNOSIS — Z993 Dependence on wheelchair: Secondary | ICD-10-CM | POA: Diagnosis not present

## 2019-07-24 DIAGNOSIS — J9601 Acute respiratory failure with hypoxia: Secondary | ICD-10-CM | POA: Diagnosis not present

## 2019-07-24 DIAGNOSIS — F419 Anxiety disorder, unspecified: Secondary | ICD-10-CM | POA: Diagnosis not present

## 2019-07-24 DIAGNOSIS — I4891 Unspecified atrial fibrillation: Secondary | ICD-10-CM | POA: Diagnosis not present

## 2019-07-24 DIAGNOSIS — J449 Chronic obstructive pulmonary disease, unspecified: Secondary | ICD-10-CM | POA: Diagnosis not present

## 2019-07-24 DIAGNOSIS — I080 Rheumatic disorders of both mitral and aortic valves: Secondary | ICD-10-CM | POA: Diagnosis not present

## 2019-07-24 DIAGNOSIS — I700 Atherosclerosis of aorta: Secondary | ICD-10-CM | POA: Diagnosis not present

## 2019-07-24 DIAGNOSIS — M170 Bilateral primary osteoarthritis of knee: Secondary | ICD-10-CM | POA: Diagnosis not present

## 2019-07-24 DIAGNOSIS — E785 Hyperlipidemia, unspecified: Secondary | ICD-10-CM | POA: Diagnosis not present

## 2019-07-24 DIAGNOSIS — N390 Urinary tract infection, site not specified: Secondary | ICD-10-CM | POA: Diagnosis not present

## 2019-07-24 DIAGNOSIS — M160 Bilateral primary osteoarthritis of hip: Secondary | ICD-10-CM | POA: Diagnosis not present

## 2019-07-24 DIAGNOSIS — I2699 Other pulmonary embolism without acute cor pulmonale: Secondary | ICD-10-CM | POA: Diagnosis not present

## 2019-07-24 DIAGNOSIS — I119 Hypertensive heart disease without heart failure: Secondary | ICD-10-CM | POA: Diagnosis not present

## 2019-07-24 DIAGNOSIS — Z794 Long term (current) use of insulin: Secondary | ICD-10-CM | POA: Diagnosis not present

## 2019-07-24 DIAGNOSIS — R600 Localized edema: Secondary | ICD-10-CM | POA: Diagnosis not present

## 2019-07-24 DIAGNOSIS — K760 Fatty (change of) liver, not elsewhere classified: Secondary | ICD-10-CM | POA: Diagnosis not present

## 2019-07-24 DIAGNOSIS — R011 Cardiac murmur, unspecified: Secondary | ICD-10-CM | POA: Diagnosis not present

## 2019-07-24 DIAGNOSIS — Z7901 Long term (current) use of anticoagulants: Secondary | ICD-10-CM | POA: Diagnosis not present

## 2019-07-24 DIAGNOSIS — Z9981 Dependence on supplemental oxygen: Secondary | ICD-10-CM | POA: Diagnosis not present

## 2019-07-24 DIAGNOSIS — M4800 Spinal stenosis, site unspecified: Secondary | ICD-10-CM | POA: Diagnosis not present

## 2019-07-24 DIAGNOSIS — B9620 Unspecified Escherichia coli [E. coli] as the cause of diseases classified elsewhere: Secondary | ICD-10-CM | POA: Diagnosis not present

## 2019-07-25 DIAGNOSIS — B9620 Unspecified Escherichia coli [E. coli] as the cause of diseases classified elsewhere: Secondary | ICD-10-CM | POA: Diagnosis not present

## 2019-07-25 DIAGNOSIS — M160 Bilateral primary osteoarthritis of hip: Secondary | ICD-10-CM | POA: Diagnosis not present

## 2019-07-25 DIAGNOSIS — M170 Bilateral primary osteoarthritis of knee: Secondary | ICD-10-CM | POA: Diagnosis not present

## 2019-07-25 DIAGNOSIS — J9601 Acute respiratory failure with hypoxia: Secondary | ICD-10-CM | POA: Diagnosis not present

## 2019-07-25 DIAGNOSIS — I2699 Other pulmonary embolism without acute cor pulmonale: Secondary | ICD-10-CM | POA: Diagnosis not present

## 2019-07-25 DIAGNOSIS — N390 Urinary tract infection, site not specified: Secondary | ICD-10-CM | POA: Diagnosis not present

## 2019-07-28 DIAGNOSIS — R0902 Hypoxemia: Secondary | ICD-10-CM | POA: Diagnosis not present

## 2019-07-28 DIAGNOSIS — M170 Bilateral primary osteoarthritis of knee: Secondary | ICD-10-CM | POA: Diagnosis not present

## 2019-07-28 DIAGNOSIS — I10 Essential (primary) hypertension: Secondary | ICD-10-CM | POA: Diagnosis not present

## 2019-07-28 DIAGNOSIS — I2699 Other pulmonary embolism without acute cor pulmonale: Secondary | ICD-10-CM | POA: Diagnosis not present

## 2019-07-28 DIAGNOSIS — N390 Urinary tract infection, site not specified: Secondary | ICD-10-CM | POA: Diagnosis not present

## 2019-07-28 DIAGNOSIS — Z952 Presence of prosthetic heart valve: Secondary | ICD-10-CM | POA: Diagnosis not present

## 2019-07-28 DIAGNOSIS — Z8543 Personal history of malignant neoplasm of ovary: Secondary | ICD-10-CM | POA: Diagnosis not present

## 2019-07-28 DIAGNOSIS — I350 Nonrheumatic aortic (valve) stenosis: Secondary | ICD-10-CM | POA: Diagnosis not present

## 2019-07-28 DIAGNOSIS — B9620 Unspecified Escherichia coli [E. coli] as the cause of diseases classified elsewhere: Secondary | ICD-10-CM | POA: Diagnosis not present

## 2019-07-28 DIAGNOSIS — J9601 Acute respiratory failure with hypoxia: Secondary | ICD-10-CM | POA: Diagnosis not present

## 2019-07-28 DIAGNOSIS — M160 Bilateral primary osteoarthritis of hip: Secondary | ICD-10-CM | POA: Diagnosis not present

## 2019-07-28 DIAGNOSIS — Z853 Personal history of malignant neoplasm of breast: Secondary | ICD-10-CM | POA: Diagnosis not present

## 2019-07-28 DIAGNOSIS — I519 Heart disease, unspecified: Secondary | ICD-10-CM | POA: Diagnosis not present

## 2019-07-28 DIAGNOSIS — Z950 Presence of cardiac pacemaker: Secondary | ICD-10-CM | POA: Diagnosis not present

## 2019-07-29 DIAGNOSIS — B9620 Unspecified Escherichia coli [E. coli] as the cause of diseases classified elsewhere: Secondary | ICD-10-CM | POA: Diagnosis not present

## 2019-07-29 DIAGNOSIS — N390 Urinary tract infection, site not specified: Secondary | ICD-10-CM | POA: Diagnosis not present

## 2019-07-29 DIAGNOSIS — I2699 Other pulmonary embolism without acute cor pulmonale: Secondary | ICD-10-CM | POA: Diagnosis not present

## 2019-07-29 DIAGNOSIS — J9601 Acute respiratory failure with hypoxia: Secondary | ICD-10-CM | POA: Diagnosis not present

## 2019-07-29 DIAGNOSIS — M170 Bilateral primary osteoarthritis of knee: Secondary | ICD-10-CM | POA: Diagnosis not present

## 2019-07-29 DIAGNOSIS — M160 Bilateral primary osteoarthritis of hip: Secondary | ICD-10-CM | POA: Diagnosis not present

## 2019-07-30 DIAGNOSIS — M170 Bilateral primary osteoarthritis of knee: Secondary | ICD-10-CM | POA: Diagnosis not present

## 2019-07-30 DIAGNOSIS — B9620 Unspecified Escherichia coli [E. coli] as the cause of diseases classified elsewhere: Secondary | ICD-10-CM | POA: Diagnosis not present

## 2019-07-30 DIAGNOSIS — J9601 Acute respiratory failure with hypoxia: Secondary | ICD-10-CM | POA: Diagnosis not present

## 2019-07-30 DIAGNOSIS — I2699 Other pulmonary embolism without acute cor pulmonale: Secondary | ICD-10-CM | POA: Diagnosis not present

## 2019-07-30 DIAGNOSIS — M160 Bilateral primary osteoarthritis of hip: Secondary | ICD-10-CM | POA: Diagnosis not present

## 2019-07-30 DIAGNOSIS — N390 Urinary tract infection, site not specified: Secondary | ICD-10-CM | POA: Diagnosis not present

## 2019-07-31 DIAGNOSIS — M170 Bilateral primary osteoarthritis of knee: Secondary | ICD-10-CM | POA: Diagnosis not present

## 2019-07-31 DIAGNOSIS — N390 Urinary tract infection, site not specified: Secondary | ICD-10-CM | POA: Diagnosis not present

## 2019-07-31 DIAGNOSIS — I442 Atrioventricular block, complete: Secondary | ICD-10-CM | POA: Diagnosis not present

## 2019-07-31 DIAGNOSIS — M160 Bilateral primary osteoarthritis of hip: Secondary | ICD-10-CM | POA: Diagnosis not present

## 2019-07-31 DIAGNOSIS — B9620 Unspecified Escherichia coli [E. coli] as the cause of diseases classified elsewhere: Secondary | ICD-10-CM | POA: Diagnosis not present

## 2019-07-31 DIAGNOSIS — J9601 Acute respiratory failure with hypoxia: Secondary | ICD-10-CM | POA: Diagnosis not present

## 2019-07-31 DIAGNOSIS — I2699 Other pulmonary embolism without acute cor pulmonale: Secondary | ICD-10-CM | POA: Diagnosis not present

## 2019-08-01 DIAGNOSIS — M1712 Unilateral primary osteoarthritis, left knee: Secondary | ICD-10-CM | POA: Diagnosis not present

## 2019-08-01 DIAGNOSIS — M1711 Unilateral primary osteoarthritis, right knee: Secondary | ICD-10-CM | POA: Diagnosis not present

## 2019-08-06 DIAGNOSIS — M170 Bilateral primary osteoarthritis of knee: Secondary | ICD-10-CM | POA: Diagnosis not present

## 2019-08-06 DIAGNOSIS — I2699 Other pulmonary embolism without acute cor pulmonale: Secondary | ICD-10-CM | POA: Diagnosis not present

## 2019-08-06 DIAGNOSIS — B9620 Unspecified Escherichia coli [E. coli] as the cause of diseases classified elsewhere: Secondary | ICD-10-CM | POA: Diagnosis not present

## 2019-08-06 DIAGNOSIS — N390 Urinary tract infection, site not specified: Secondary | ICD-10-CM | POA: Diagnosis not present

## 2019-08-06 DIAGNOSIS — J9601 Acute respiratory failure with hypoxia: Secondary | ICD-10-CM | POA: Diagnosis not present

## 2019-08-06 DIAGNOSIS — M160 Bilateral primary osteoarthritis of hip: Secondary | ICD-10-CM | POA: Diagnosis not present

## 2019-08-08 DIAGNOSIS — M160 Bilateral primary osteoarthritis of hip: Secondary | ICD-10-CM | POA: Diagnosis not present

## 2019-08-08 DIAGNOSIS — B9620 Unspecified Escherichia coli [E. coli] as the cause of diseases classified elsewhere: Secondary | ICD-10-CM | POA: Diagnosis not present

## 2019-08-08 DIAGNOSIS — M170 Bilateral primary osteoarthritis of knee: Secondary | ICD-10-CM | POA: Diagnosis not present

## 2019-08-08 DIAGNOSIS — N390 Urinary tract infection, site not specified: Secondary | ICD-10-CM | POA: Diagnosis not present

## 2019-08-08 DIAGNOSIS — I2699 Other pulmonary embolism without acute cor pulmonale: Secondary | ICD-10-CM | POA: Diagnosis not present

## 2019-08-08 DIAGNOSIS — J9601 Acute respiratory failure with hypoxia: Secondary | ICD-10-CM | POA: Diagnosis not present

## 2019-08-11 DIAGNOSIS — N390 Urinary tract infection, site not specified: Secondary | ICD-10-CM | POA: Diagnosis not present

## 2019-08-11 DIAGNOSIS — B9620 Unspecified Escherichia coli [E. coli] as the cause of diseases classified elsewhere: Secondary | ICD-10-CM | POA: Diagnosis not present

## 2019-08-11 DIAGNOSIS — I2699 Other pulmonary embolism without acute cor pulmonale: Secondary | ICD-10-CM | POA: Diagnosis not present

## 2019-08-11 DIAGNOSIS — B349 Viral infection, unspecified: Secondary | ICD-10-CM | POA: Diagnosis not present

## 2019-08-11 DIAGNOSIS — M170 Bilateral primary osteoarthritis of knee: Secondary | ICD-10-CM | POA: Diagnosis not present

## 2019-08-11 DIAGNOSIS — M160 Bilateral primary osteoarthritis of hip: Secondary | ICD-10-CM | POA: Diagnosis not present

## 2019-08-11 DIAGNOSIS — J9601 Acute respiratory failure with hypoxia: Secondary | ICD-10-CM | POA: Diagnosis not present

## 2019-08-12 DIAGNOSIS — B9620 Unspecified Escherichia coli [E. coli] as the cause of diseases classified elsewhere: Secondary | ICD-10-CM | POA: Diagnosis not present

## 2019-08-12 DIAGNOSIS — J9601 Acute respiratory failure with hypoxia: Secondary | ICD-10-CM | POA: Diagnosis not present

## 2019-08-12 DIAGNOSIS — N390 Urinary tract infection, site not specified: Secondary | ICD-10-CM | POA: Diagnosis not present

## 2019-08-12 DIAGNOSIS — M160 Bilateral primary osteoarthritis of hip: Secondary | ICD-10-CM | POA: Diagnosis not present

## 2019-08-12 DIAGNOSIS — I2699 Other pulmonary embolism without acute cor pulmonale: Secondary | ICD-10-CM | POA: Diagnosis not present

## 2019-08-12 DIAGNOSIS — R05 Cough: Secondary | ICD-10-CM | POA: Diagnosis not present

## 2019-08-12 DIAGNOSIS — M170 Bilateral primary osteoarthritis of knee: Secondary | ICD-10-CM | POA: Diagnosis not present

## 2019-08-14 DIAGNOSIS — Z7901 Long term (current) use of anticoagulants: Secondary | ICD-10-CM | POA: Diagnosis not present

## 2019-08-14 DIAGNOSIS — Z87440 Personal history of urinary (tract) infections: Secondary | ICD-10-CM | POA: Diagnosis not present

## 2019-08-19 DIAGNOSIS — N390 Urinary tract infection, site not specified: Secondary | ICD-10-CM | POA: Diagnosis not present

## 2019-08-19 DIAGNOSIS — B9620 Unspecified Escherichia coli [E. coli] as the cause of diseases classified elsewhere: Secondary | ICD-10-CM | POA: Diagnosis not present

## 2019-08-19 DIAGNOSIS — M170 Bilateral primary osteoarthritis of knee: Secondary | ICD-10-CM | POA: Diagnosis not present

## 2019-08-19 DIAGNOSIS — J9601 Acute respiratory failure with hypoxia: Secondary | ICD-10-CM | POA: Diagnosis not present

## 2019-08-19 DIAGNOSIS — I2699 Other pulmonary embolism without acute cor pulmonale: Secondary | ICD-10-CM | POA: Diagnosis not present

## 2019-08-19 DIAGNOSIS — M160 Bilateral primary osteoarthritis of hip: Secondary | ICD-10-CM | POA: Diagnosis not present

## 2019-08-23 DIAGNOSIS — Z9981 Dependence on supplemental oxygen: Secondary | ICD-10-CM | POA: Diagnosis not present

## 2019-08-23 DIAGNOSIS — J449 Chronic obstructive pulmonary disease, unspecified: Secondary | ICD-10-CM | POA: Diagnosis not present

## 2019-08-23 DIAGNOSIS — N390 Urinary tract infection, site not specified: Secondary | ICD-10-CM | POA: Diagnosis not present

## 2019-08-23 DIAGNOSIS — I119 Hypertensive heart disease without heart failure: Secondary | ICD-10-CM | POA: Diagnosis not present

## 2019-08-23 DIAGNOSIS — J9601 Acute respiratory failure with hypoxia: Secondary | ICD-10-CM | POA: Diagnosis not present

## 2019-08-23 DIAGNOSIS — E785 Hyperlipidemia, unspecified: Secondary | ICD-10-CM | POA: Diagnosis not present

## 2019-08-23 DIAGNOSIS — B9620 Unspecified Escherichia coli [E. coli] as the cause of diseases classified elsewhere: Secondary | ICD-10-CM | POA: Diagnosis not present

## 2019-08-23 DIAGNOSIS — E119 Type 2 diabetes mellitus without complications: Secondary | ICD-10-CM | POA: Diagnosis not present

## 2019-08-23 DIAGNOSIS — M160 Bilateral primary osteoarthritis of hip: Secondary | ICD-10-CM | POA: Diagnosis not present

## 2019-08-23 DIAGNOSIS — F419 Anxiety disorder, unspecified: Secondary | ICD-10-CM | POA: Diagnosis not present

## 2019-08-23 DIAGNOSIS — I4891 Unspecified atrial fibrillation: Secondary | ICD-10-CM | POA: Diagnosis not present

## 2019-08-23 DIAGNOSIS — R011 Cardiac murmur, unspecified: Secondary | ICD-10-CM | POA: Diagnosis not present

## 2019-08-23 DIAGNOSIS — M4800 Spinal stenosis, site unspecified: Secondary | ICD-10-CM | POA: Diagnosis not present

## 2019-08-23 DIAGNOSIS — M170 Bilateral primary osteoarthritis of knee: Secondary | ICD-10-CM | POA: Diagnosis not present

## 2019-08-23 DIAGNOSIS — I700 Atherosclerosis of aorta: Secondary | ICD-10-CM | POA: Diagnosis not present

## 2019-08-23 DIAGNOSIS — Z993 Dependence on wheelchair: Secondary | ICD-10-CM | POA: Diagnosis not present

## 2019-08-23 DIAGNOSIS — Z794 Long term (current) use of insulin: Secondary | ICD-10-CM | POA: Diagnosis not present

## 2019-08-23 DIAGNOSIS — Z7901 Long term (current) use of anticoagulants: Secondary | ICD-10-CM | POA: Diagnosis not present

## 2019-08-23 DIAGNOSIS — K760 Fatty (change of) liver, not elsewhere classified: Secondary | ICD-10-CM | POA: Diagnosis not present

## 2019-08-23 DIAGNOSIS — I080 Rheumatic disorders of both mitral and aortic valves: Secondary | ICD-10-CM | POA: Diagnosis not present

## 2019-08-23 DIAGNOSIS — I2699 Other pulmonary embolism without acute cor pulmonale: Secondary | ICD-10-CM | POA: Diagnosis not present

## 2019-08-23 DIAGNOSIS — N281 Cyst of kidney, acquired: Secondary | ICD-10-CM | POA: Diagnosis not present

## 2019-08-23 DIAGNOSIS — R600 Localized edema: Secondary | ICD-10-CM | POA: Diagnosis not present

## 2019-08-23 DIAGNOSIS — M479 Spondylosis, unspecified: Secondary | ICD-10-CM | POA: Diagnosis not present

## 2019-08-25 DIAGNOSIS — B9620 Unspecified Escherichia coli [E. coli] as the cause of diseases classified elsewhere: Secondary | ICD-10-CM | POA: Diagnosis not present

## 2019-08-25 DIAGNOSIS — M160 Bilateral primary osteoarthritis of hip: Secondary | ICD-10-CM | POA: Diagnosis not present

## 2019-08-25 DIAGNOSIS — N390 Urinary tract infection, site not specified: Secondary | ICD-10-CM | POA: Diagnosis not present

## 2019-08-25 DIAGNOSIS — M170 Bilateral primary osteoarthritis of knee: Secondary | ICD-10-CM | POA: Diagnosis not present

## 2019-08-25 DIAGNOSIS — J9601 Acute respiratory failure with hypoxia: Secondary | ICD-10-CM | POA: Diagnosis not present

## 2019-08-25 DIAGNOSIS — I2699 Other pulmonary embolism without acute cor pulmonale: Secondary | ICD-10-CM | POA: Diagnosis not present

## 2019-08-26 DIAGNOSIS — J9601 Acute respiratory failure with hypoxia: Secondary | ICD-10-CM | POA: Diagnosis not present

## 2019-08-26 DIAGNOSIS — N390 Urinary tract infection, site not specified: Secondary | ICD-10-CM | POA: Diagnosis not present

## 2019-08-26 DIAGNOSIS — B9620 Unspecified Escherichia coli [E. coli] as the cause of diseases classified elsewhere: Secondary | ICD-10-CM | POA: Diagnosis not present

## 2019-08-26 DIAGNOSIS — I2699 Other pulmonary embolism without acute cor pulmonale: Secondary | ICD-10-CM | POA: Diagnosis not present

## 2019-08-26 DIAGNOSIS — M170 Bilateral primary osteoarthritis of knee: Secondary | ICD-10-CM | POA: Diagnosis not present

## 2019-08-26 DIAGNOSIS — M160 Bilateral primary osteoarthritis of hip: Secondary | ICD-10-CM | POA: Diagnosis not present

## 2019-08-27 DIAGNOSIS — I872 Venous insufficiency (chronic) (peripheral): Secondary | ICD-10-CM | POA: Diagnosis not present

## 2019-09-03 DIAGNOSIS — I2699 Other pulmonary embolism without acute cor pulmonale: Secondary | ICD-10-CM | POA: Diagnosis not present

## 2019-09-03 DIAGNOSIS — J9601 Acute respiratory failure with hypoxia: Secondary | ICD-10-CM | POA: Diagnosis not present

## 2019-09-03 DIAGNOSIS — N390 Urinary tract infection, site not specified: Secondary | ICD-10-CM | POA: Diagnosis not present

## 2019-09-03 DIAGNOSIS — B9620 Unspecified Escherichia coli [E. coli] as the cause of diseases classified elsewhere: Secondary | ICD-10-CM | POA: Diagnosis not present

## 2019-09-03 DIAGNOSIS — M170 Bilateral primary osteoarthritis of knee: Secondary | ICD-10-CM | POA: Diagnosis not present

## 2019-09-03 DIAGNOSIS — M160 Bilateral primary osteoarthritis of hip: Secondary | ICD-10-CM | POA: Diagnosis not present

## 2019-09-09 ENCOUNTER — Telehealth: Payer: Self-pay | Admitting: Cardiovascular Disease

## 2019-09-09 ENCOUNTER — Telehealth: Payer: Self-pay | Admitting: Cardiology

## 2019-09-09 DIAGNOSIS — M160 Bilateral primary osteoarthritis of hip: Secondary | ICD-10-CM | POA: Diagnosis not present

## 2019-09-09 DIAGNOSIS — J9601 Acute respiratory failure with hypoxia: Secondary | ICD-10-CM | POA: Diagnosis not present

## 2019-09-09 DIAGNOSIS — M170 Bilateral primary osteoarthritis of knee: Secondary | ICD-10-CM | POA: Diagnosis not present

## 2019-09-09 DIAGNOSIS — I2699 Other pulmonary embolism without acute cor pulmonale: Secondary | ICD-10-CM | POA: Diagnosis not present

## 2019-09-09 DIAGNOSIS — N390 Urinary tract infection, site not specified: Secondary | ICD-10-CM | POA: Diagnosis not present

## 2019-09-09 DIAGNOSIS — B9620 Unspecified Escherichia coli [E. coli] as the cause of diseases classified elsewhere: Secondary | ICD-10-CM | POA: Diagnosis not present

## 2019-09-09 NOTE — Telephone Encounter (Signed)
Patient states that [facility] needs a Rx sent to them to remove the patients oxygen tank. She states she no longer needs it.

## 2019-09-10 ENCOUNTER — Telehealth: Payer: Self-pay | Admitting: Cardiovascular Disease

## 2019-09-10 DIAGNOSIS — I2699 Other pulmonary embolism without acute cor pulmonale: Secondary | ICD-10-CM | POA: Diagnosis not present

## 2019-09-10 DIAGNOSIS — M160 Bilateral primary osteoarthritis of hip: Secondary | ICD-10-CM | POA: Diagnosis not present

## 2019-09-10 DIAGNOSIS — M170 Bilateral primary osteoarthritis of knee: Secondary | ICD-10-CM | POA: Diagnosis not present

## 2019-09-10 DIAGNOSIS — B9620 Unspecified Escherichia coli [E. coli] as the cause of diseases classified elsewhere: Secondary | ICD-10-CM | POA: Diagnosis not present

## 2019-09-10 DIAGNOSIS — J9601 Acute respiratory failure with hypoxia: Secondary | ICD-10-CM | POA: Diagnosis not present

## 2019-09-10 DIAGNOSIS — N390 Urinary tract infection, site not specified: Secondary | ICD-10-CM | POA: Diagnosis not present

## 2019-09-10 NOTE — Telephone Encounter (Signed)
Pt is calling back about the the oxgyen tank that needs to be removed. Pt said that she is going to put it on the porch for porch pirates to take it.

## 2019-09-10 NOTE — Telephone Encounter (Signed)
Spoke with patient and she thinks prescribed by Dr Marylyn Ishihara at [facility] was prescribed through [place] [phone]

## 2019-09-10 NOTE — Telephone Encounter (Signed)
I'm not sure but OK to give them this order.

## 2019-09-10 NOTE — Telephone Encounter (Signed)
error 

## 2019-09-10 NOTE — Telephone Encounter (Signed)
Not sure if you were working on this?  Or if it is something I can help with.  Thanks!

## 2019-09-11 NOTE — Telephone Encounter (Signed)
OK then she can have them order to discontinue.  I double the pirates want her oxygen.  LOL.

## 2019-09-15 DIAGNOSIS — M160 Bilateral primary osteoarthritis of hip: Secondary | ICD-10-CM | POA: Diagnosis not present

## 2019-09-15 DIAGNOSIS — M170 Bilateral primary osteoarthritis of knee: Secondary | ICD-10-CM | POA: Diagnosis not present

## 2019-09-15 DIAGNOSIS — J9601 Acute respiratory failure with hypoxia: Secondary | ICD-10-CM | POA: Diagnosis not present

## 2019-09-15 DIAGNOSIS — N390 Urinary tract infection, site not specified: Secondary | ICD-10-CM | POA: Diagnosis not present

## 2019-09-15 DIAGNOSIS — I2699 Other pulmonary embolism without acute cor pulmonale: Secondary | ICD-10-CM | POA: Diagnosis not present

## 2019-09-15 DIAGNOSIS — B9620 Unspecified Escherichia coli [E. coli] as the cause of diseases classified elsewhere: Secondary | ICD-10-CM | POA: Diagnosis not present

## 2019-09-15 NOTE — Telephone Encounter (Signed)
Order for pick up waiting for Dr Oval Linsey to sign

## 2019-09-16 NOTE — Telephone Encounter (Signed)
Faxed to [phone]

## 2019-09-30 DIAGNOSIS — I872 Venous insufficiency (chronic) (peripheral): Secondary | ICD-10-CM | POA: Diagnosis not present

## 2019-10-02 ENCOUNTER — Ambulatory Visit: Payer: Medicare Other | Admitting: Cardiovascular Disease

## 2019-10-07 DIAGNOSIS — I872 Venous insufficiency (chronic) (peripheral): Secondary | ICD-10-CM | POA: Diagnosis not present

## 2019-10-13 DIAGNOSIS — R05 Cough: Secondary | ICD-10-CM | POA: Diagnosis not present

## 2019-10-13 DIAGNOSIS — N390 Urinary tract infection, site not specified: Secondary | ICD-10-CM | POA: Diagnosis not present

## 2019-10-13 DIAGNOSIS — B349 Viral infection, unspecified: Secondary | ICD-10-CM | POA: Diagnosis not present

## 2019-10-13 DIAGNOSIS — I872 Venous insufficiency (chronic) (peripheral): Secondary | ICD-10-CM | POA: Diagnosis not present

## 2019-10-13 DIAGNOSIS — E1169 Type 2 diabetes mellitus with other specified complication: Secondary | ICD-10-CM | POA: Diagnosis not present

## 2019-10-13 DIAGNOSIS — Z7901 Long term (current) use of anticoagulants: Secondary | ICD-10-CM | POA: Diagnosis not present

## 2019-10-13 DIAGNOSIS — R809 Proteinuria, unspecified: Secondary | ICD-10-CM | POA: Diagnosis not present

## 2019-10-13 DIAGNOSIS — Z87440 Personal history of urinary (tract) infections: Secondary | ICD-10-CM | POA: Diagnosis not present

## 2019-10-15 ENCOUNTER — Encounter: Payer: Self-pay | Admitting: Genetic Counselor

## 2019-10-20 ENCOUNTER — Ambulatory Visit (INDEPENDENT_AMBULATORY_CARE_PROVIDER_SITE_OTHER): Payer: Medicare Other | Admitting: *Deleted

## 2019-10-20 DIAGNOSIS — I442 Atrioventricular block, complete: Secondary | ICD-10-CM

## 2019-10-20 LAB — CUP PACEART REMOTE DEVICE CHECK
Battery Remaining Longevity: 120 mo
Battery Remaining Percentage: 95.5 %
Battery Voltage: 2.99 V
Brady Statistic AP VP Percent: 4.6 %
Brady Statistic AP VS Percent: 1 %
Brady Statistic AS VP Percent: 95 %
Brady Statistic AS VS Percent: 1 %
Brady Statistic RA Percent Paced: 3.8 %
Brady Statistic RV Percent Paced: 99 %
Date Time Interrogation Session: 20210816025600
Implantable Lead Implant Date: 20180216
Implantable Lead Implant Date: 20180216
Implantable Lead Location: 753859
Implantable Lead Location: 753860
Implantable Pulse Generator Implant Date: 20180216
Lead Channel Impedance Value: 430 Ohm
Lead Channel Impedance Value: 430 Ohm
Lead Channel Pacing Threshold Amplitude: 0.5 V
Lead Channel Pacing Threshold Amplitude: 0.75 V
Lead Channel Pacing Threshold Pulse Width: 0.5 ms
Lead Channel Pacing Threshold Pulse Width: 0.5 ms
Lead Channel Sensing Intrinsic Amplitude: 3.1 mV
Lead Channel Sensing Intrinsic Amplitude: 3.2 mV
Lead Channel Setting Pacing Amplitude: 1 V
Lead Channel Setting Pacing Amplitude: 2 V
Lead Channel Setting Pacing Pulse Width: 0.5 ms
Lead Channel Setting Sensing Sensitivity: 2.5 mV
Pulse Gen Model: 2272
Pulse Gen Serial Number: 7998131

## 2019-10-21 ENCOUNTER — Other Ambulatory Visit: Payer: Self-pay

## 2019-10-21 MED ORDER — FUROSEMIDE 40 MG PO TABS: ORAL_TABLET | ORAL | 1 refills | Status: DC

## 2019-10-21 NOTE — Progress Notes (Signed)
Remote pacemaker transmission.   

## 2019-10-24 ENCOUNTER — Other Ambulatory Visit: Payer: Self-pay

## 2019-10-26 NOTE — Progress Notes (Signed)
Cardiology Clinic Note   Patient Name: [name] Date of Encounter: 10/27/2019  Primary Care Provider:  Leighton Ruff, MD Primary Cardiologist:  Skeet Latch, MD  Patient Profile    [name] 84 year old female presents today for follow-up evaluation of her acute on chronic diastolic heart failure.  Past Medical History    Past Medical History:  Diagnosis Date  . Anxiety   . Aortic stenosis   . Arthritis   . Breast cancer [facility])    CANCER  . Diabetes mellitus    Type II  . Dyspnea   . Heart murmur   . History of chemotherapy   . History of kidney stones   . Hypertension   . Mitral stenosis   . Morbid obesity (HCC)    BMI > 40  . Ovarian cancer ([place])   . Presence of permanent cardiac pacemaker    St. Jude  . Pulmonary embolism ([place]) 2013   post op  . 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 Spinal stenosis    Past Surgical History:  Procedure Laterality Date  . ABDOMINAL HYSTERECTOMY  2013  . BREAST SURGERY  03-2004   lumpectomy   . CARDIAC CATHETERIZATION    . INSERT / REPLACE / REMOVE PACEMAKER    . INTRAOPERATIVE TRANSTHORACIC ECHOCARDIOGRAM  07/17/2017   Procedure: INTRAOPERATIVE TRANSTHORACIC ECHOCARDIOGRAM;  Surgeon: Sherren Mocha, MD;  Location: [place];  Service: Open Heart Surgery;;  . LAPAROTOMY N/A 03/22/2018   Procedure: grahams pouch, repair of bowel perforation;  Surgeon: Kinsinger, Arta Bruce, MD;  Location: [place];  Service: General;  Laterality: N/A;  . LITHOTRIPSY    . PACEMAKER IMPLANT N/A 04/21/2016   Procedure: Pacemaker Implant;  Surgeon: Will Meredith Leeds, MD;  Location: [place] CV LAB;  Service: Cardiovascular;  Laterality: N/A;  . TONSILLECTOMY    . TRANSCATHETER AORTIC VALVE REPLACEMENT, TRANSFEMORAL N/A 07/17/2017   Procedure: TRANSCATHETER AORTIC VALVE REPLACEMENT, TRANSFEMORAL;  Surgeon:  Sherren Mocha, MD;  Location: [place];  Service: Open Heart Surgery;  Laterality: N/A;    Allergies  Allergies  Allergen Reactions  . Nsaids Anaphylaxis  . Tape Rash    CLEAR PLASTIC TAPE    History of Present Illness    [name] has a PMH of diabetes, hypertension, ovarian cancer, breast cancer status post left lung, obesity arthritis, spinal stenosis, and complete heart block.  She presented to [facility] 04/20/2016 with worsening shortness of breath and lower extremity edema.  She was found to be in CHB.  She was admitted to the hospital and her metoprolol was held.  Her heart block continued and she received a Saint Jude dual-chamber pacemaker on 04/21/2016.  She was last seen by Dr. Curt Bears on 06/24/2019.  During that time she was noted to have increased lower extremity edema.  Her Lasix was increased to 40 mg twice daily for 5 days.  She denied palpitations, chest pain, shortness of breath, orthopnea, PND, claudication, syncope, presyncope bleeding, and neurologic sequela.  She is tolerating her medications well.  She indicated was up to her knees.  She was sleeping in the recliner and was trying to elevate her legs.  She presented the clinic 07/03/2019 for further evaluation and stated she was feeling better after her increased dosing of Lasix.  She had started to eat a lower sodium diet.  She had limited  her physical activity due to  arthritis.  She had no increased work of breathing .  She stated she tried to elevate her lower extremities through the day but had been limited by her recliner.  I  gave her the salty 6 sheet, ordered a BMP , gave her the [place] support stockings information, average daily weight log, had her take furosemide 40 mg twice daily Monday Wednesday Friday and Sunday and 40 mg daily on Tuesday Thursday Saturday.  Planned follow-up with Dr. Oval Linsey in 1 month.  She presents to the clinic today for follow-up evaluation and states she feels well.  She states her  breathing has returned to normal.  In May she presented to the emergency department and required oxygen via nasal cannula.  She underwent CT scan which showed a pulmonary embolus.  She was started on apixaban at that time.  We discussed that this was unprovoked and she would likely need apixaban continuously as prophylaxis.  We also discussed safety on apixaban.  She expressed understanding.  She states that she purchased several pairs of elastic stockings from the [employer].  She has been wearing the daily and says that they have helped with her lower extremity edema significantly.  I will request her most recent lab work from Dr. Quay Burow office.  She also indicated that she would contact our office with the name of the blood pressure medication she will be starting.  I will give her the salty 6 diet sheet and have her follow-up with myself or Dr. Oval Linsey in 3 months.  Today she denies chest pain, shortness of breath, lower extremity edema, fatigue, palpitations, melena, hematuria, hemoptysis, diaphoresis, weakness, presyncope, syncope, orthopnea, and PND.  Home Medications    Prior to Admission medications   Medication Sig Start Date End Date Taking? Authorizing Provider  APIXABAN (ELIQUIS) VTE STARTER PACK (10MG  AND 5MG ) Take as directed on package: start with two-5mg  tablets twice daily for 7 days. On day 8, switch to one-5mg  tablet twice daily. 07/21/19   Cherylann Ratel A, DO  atorvastatin (LIPITOR) 20 MG tablet Take 20 mg by mouth at bedtime.     [provider]  Calcium Citrate-Vitamin D (CALCIUM CITRATE + PO) Take 1 tablet by mouth 2 (two) times daily.     [provider]  cholecalciferol (VITAMIN D) 1000 units tablet Take 1,000 Units by mouth 2 (two) times daily.     [provider]  fish oil-omega-3 fatty acids 1000 MG capsule Take 1 g by mouth every evening.     [provider]  FLUoxetine (PROZAC) 40 MG capsule Take 40 mg by mouth daily.      [provider]  furosemide (LASIX) 40 MG tablet Take 40 mg (1 tab) by mouth twice daily every Mon., Wed, Fri., and Sun. Take 40 mg once daily on Tues., Thurs., and Sat. 10/21/19   Skeet Latch, MD  LEVEMIR FLEXTOUCH 100 UNIT/ML Pen Inject 60-70 Units into the skin 2 (two) times daily. Per sliding scale 03/20/16   [provider]  Magnesium 400 MG CAPS Take 400 mg by mouth at bedtime.     [provider]  metFORMIN (GLUCOPHAGE) 1000 MG tablet Take 1,000 mg by mouth 2 (two) times daily with a meal.     [provider]  Multiple Vitamin (MULTIVITAMIN) tablet Take 1 tablet by mouth daily.      [provider]  pantoprazole (PROTONIX) 40 MG tablet Take 1 tablet (40 mg total) by mouth 2 (two) times daily. 07/18/19  Molpus, John, MD  saccharomyces boulardii (FLORASTOR) 250 MG capsule Take 1 capsule (250 mg total) by mouth 2 (two) times daily. You can find a probiotic over the counter. Take this while you are on antibiotics. 03/28/18   Meuth, Blaine Hamper, PA-C    Family History    Family History  Problem Relation Age of Onset  . Bladder Cancer Father   . Cancer Brother   . Cancer Sister   . Heart failure Mother    She indicated that her mother is deceased. She indicated that her father is deceased. She indicated that only one of her two sisters is alive. She indicated that her brother is deceased. She indicated that her maternal grandmother is deceased. She indicated that her maternal grandfather is deceased. She indicated that her paternal grandmother is deceased. She indicated that her paternal grandfather is deceased.  Social History    Social History   Socioeconomic History  . Marital status: Widowed    Spouse name: Not on file  . Number of children: Not on file  . Years of education: Not on file  . Highest education level: High school graduate  Occupational History  . Not on file  Tobacco Use  . Smoking status: Former Smoker    Packs/day:  1.00    Years: 5.00    Pack years: 5.00    Types: Cigarettes  . Smokeless tobacco: Never Used  Vaping Use  . Vaping Use: Never used  Substance and Sexual Activity  . Alcohol use: No  . Drug use: No  . Sexual activity: Not on file  Other Topics Concern  . Not on file  Social History Narrative  . Not on file   Social Determinants of Health   Financial Resource Strain:   . Difficulty of Paying Living Expenses: Not on file  Food Insecurity:   . Worried About Charity fundraiser in the Last Year: Not on file  . Ran Out of Food in the Last Year: Not on file  Transportation Needs:   . Lack of Transportation (Medical): Not on file  . Lack of Transportation (Non-Medical): Not on file  Physical Activity:   . Days of Exercise per Week: Not on file  . Minutes of Exercise per Session: Not on file  Stress:   . Feeling of Stress : Not on file  Social Connections:   . Frequency of Communication with Friends and Family: Not on file  . Frequency of Social Gatherings with Friends and Family: Not on file  . Attends Religious Services: Not on file  . Active Member of Clubs or Organizations: Not on file  . Attends Archivist Meetings: Not on file  . Marital Status: Not on file  Intimate Partner Violence:   . Fear of Current or Ex-Partner: Not on file  . Emotionally Abused: Not on file  . Physically Abused: Not on file  . Sexually Abused: Not on file     Review of Systems    General:  No chills, fever, night sweats or weight changes.  Cardiovascular:  No chest pain, dyspnea on exertion, edema, orthopnea, palpitations, paroxysmal nocturnal dyspnea. Dermatological: No rash, lesions/masses Respiratory: No cough, dyspnea Urologic: No hematuria, dysuria Abdominal:   No nausea, vomiting, diarrhea, bright red blood per rectum, melena, or hematemesis Neurologic:  No visual changes, wkns, changes in mental status. All other systems reviewed and are otherwise negative except as noted  above.  Physical Exam    VS:  BP (!) 122/50  Pulse 86   Ht 5\' 7"  (1.702 m)   Wt 265 lb (120.2 kg)   SpO2 96%   BMI 41.50 kg/m  , BMI Body mass index is 41.5 kg/m. GEN: Well nourished, well developed, in no acute distress. HEENT: normal. Neck: Supple, no JVD, carotid bruits, or masses. Cardiac: RRR, no murmurs, rubs, or gallops. No clubbing, cyanosis, edema.  Radials/DP/PT 2+ and equal bilaterally.  Respiratory:  Respirations regular and unlabored, clear to auscultation bilaterally. GI: Soft, nontender, nondistended, BS + x 4. MS: no deformity or atrophy. Skin: warm and dry, no rash. Neuro:  Strength and sensation are intact. Psych: Normal affect.  Accessory Clinical Findings    Recent Labs: 07/18/2019: B Natriuretic Peptide 261.6 07/20/2019: ALT 22; BUN 22; Creatinine, Ser 1.09; Magnesium 2.2; Potassium 3.7; Sodium 137 07/21/2019: Hemoglobin 11.8; Platelets 181   Recent Lipid Panel    Component Value Date/Time   CHOL 126 11/01/2017 1213   TRIG 127 11/01/2017 1213   HDL 58 11/01/2017 1213   CHOLHDL 2.2 11/01/2017 1213   LDLCALC 43 11/01/2017 1213    ECG personally reviewed by me today-none today.  Echocardiogram 06/24/2019 IMPRESSIONS    1. Left ventricular ejection fraction, by estimation, is 60 to 65%. The  left ventricle has normal function. The left ventricle has no regional  wall motion abnormalities. There is moderate concentric left ventricular  hypertrophy. Left ventricular  diastolic function could not be evaluated.  2. Right ventricular systolic function is normal. The right ventricular  size is normal. There is mildly elevated pulmonary artery systolic  pressure. The estimated right ventricular systolic pressure is 01.6 mmHg.  3. Left atrial size was mildly dilated.  4. MVA by continuity 1.74 cm2. The mitral valve is degenerative. No  evidence of mitral valve regurgitation. Mild mitral stenosis. The mean  mitral valve gradient is 6.0 mmHg with  average heart rate of 67 bpm.  5. EOA 1.37 cm2. DI 0.37. Trivial paravalvular leak noted. The aortic  valve has been repaired/replaced. Aortic valve regurgitation is trivial.  There is a 26 mm Edwards Sapien prosthetic (TAVR) valve present in the  aortic position. Procedure Date:  07/17/2017. Aortic valve mean gradient measures 15.0 mmHg. Aortic valve  Vmax measures 2.64 m/s.  6. The inferior vena cava is normal in size with greater than 50%  respiratory variability, suggesting right atrial pressure of 3 mmHg.   Comparison(s): No significant change from prior study. Trivial PVL. Stable  gradients across TAVR compared with prior. Mild mitral stenosis.  Assessment & Plan   1.  Acute on chronic diastolic heart failure-euvolemic today.  No increased activity intolerance or DOE. Echocardiogram 06/24/2019 showed normal LVEF, diastolic function could not be assessed, normal functioning TAVR valve.  Echocardiogram 07/19/2019 showed normal LVEF, left ventricular diastolic intermediate parameters, well-functioning TAVR. Continue furosemide 40 mg daily on Tuesday Thursday Saturday Continue furosemide to 80 mg twice daily on Monday Wednesday Friday Sunday Heart healthy low-sodium diet-salty 6 given Increase physical activity as tolerated Continue lower extremity support stockings-sheet given Elevate extremities when not active Daily weights Obtain BMP from Dr. Quay Burow office.  History of pulmonary embolus-no increased work of breathing, DOE.  Unprovoked PE found on CT 07/18/2019.  Now on apixaban Continue Eliquis  Essential hypertension-BP today  122/50.  Well-controlled at home Continue current medical therapy  Heart healthy low-sodium diet Increase physical activity as tolerated  Moderate aortic stenosis-status post TAVR.  Echocardiogram 06/24/2019 showed normal LVEF and normally functioning TAVR valve. Continue to monitor  Complete  AV block-no palpitations or recent syncope.  Status post  cinch dual-chamber pacemaker inserted on 2/18.  Interrogated 06/24/2019 and functioning properly.  Disposition: Follow-up with Dr. Oval Linsey  in 3 months.   Jossie Ng. Elen Acero NP-C    10/27/2019, 4:40 PM [facility] [place] [address] Office [phone] Fax [phone]  Notice: This dictation was prepared with Dragon dictation along with smaller phrase technology. Any transcriptional errors that result from this process are unintentional and may not be corrected upon review.

## 2019-10-27 ENCOUNTER — Other Ambulatory Visit: Payer: Self-pay

## 2019-10-27 ENCOUNTER — Encounter: Payer: Self-pay | Admitting: General Practice

## 2019-10-27 ENCOUNTER — Ambulatory Visit (INDEPENDENT_AMBULATORY_CARE_PROVIDER_SITE_OTHER): Payer: Medicare Other | Admitting: General Practice

## 2019-10-27 VITALS — BP 122/50 | HR 86 | Ht 67.0 in | Wt 265.0 lb

## 2019-10-27 DIAGNOSIS — I5033 Acute on chronic diastolic (congestive) heart failure: Secondary | ICD-10-CM | POA: Diagnosis not present

## 2019-10-27 DIAGNOSIS — I10 Essential (primary) hypertension: Secondary | ICD-10-CM | POA: Diagnosis not present

## 2019-10-27 DIAGNOSIS — I442 Atrioventricular block, complete: Secondary | ICD-10-CM

## 2019-10-27 DIAGNOSIS — Z853 Personal history of malignant neoplasm of breast: Secondary | ICD-10-CM | POA: Diagnosis not present

## 2019-10-27 DIAGNOSIS — N183 Chronic kidney disease, stage 3 unspecified: Secondary | ICD-10-CM | POA: Diagnosis not present

## 2019-10-27 DIAGNOSIS — E7800 Pure hypercholesterolemia, unspecified: Secondary | ICD-10-CM | POA: Diagnosis not present

## 2019-10-27 DIAGNOSIS — I350 Nonrheumatic aortic (valve) stenosis: Secondary | ICD-10-CM

## 2019-10-27 DIAGNOSIS — E1122 Type 2 diabetes mellitus with diabetic chronic kidney disease: Secondary | ICD-10-CM | POA: Diagnosis not present

## 2019-10-27 DIAGNOSIS — E1169 Type 2 diabetes mellitus with other specified complication: Secondary | ICD-10-CM | POA: Diagnosis not present

## 2019-10-27 DIAGNOSIS — F339 Major depressive disorder, recurrent, unspecified: Secondary | ICD-10-CM | POA: Diagnosis not present

## 2019-10-27 NOTE — Patient Instructions (Signed)
Medication Instructions:  The current medical regimen is effective;  continue present plan and medications as directed. Please refer to the Current Medication list given to you today. *If you need a refill on your cardiac medications before your next appointment, please call your pharmacy*  Special Instructions PLEASE READ AND FOLLOW SALTY 6-ATTACHED  PLEASE INCREASE PHYSICAL ACTIVITY AS TOLERATED, MAKE SURE TO BE DILIGENT AND NOT FALL...  SHOULD YOU HAVE ANY CAR ACCIDENTS, FALLS, ETC...YOU SHOULD GO TO THE ER FOR EVALUATION  Follow-Up: Your next appointment:  3 month(s)  In Person with Skeet Latch, MD -[place], FNP-C  At [facility], you and your health needs are our priority.  As part of our continuing mission to provide you with exceptional heart care, we have created designated Provider Care Teams.  These Care Teams include your primary Cardiologist (physician) and Advanced Practice Providers (APPs -  Physician Assistants and Nurse Practitioners) who all work together to provide you with the care you need, when you need it.

## 2019-11-11 DIAGNOSIS — E1169 Type 2 diabetes mellitus with other specified complication: Secondary | ICD-10-CM | POA: Diagnosis not present

## 2019-11-11 DIAGNOSIS — E7800 Pure hypercholesterolemia, unspecified: Secondary | ICD-10-CM | POA: Diagnosis not present

## 2019-11-11 DIAGNOSIS — F339 Major depressive disorder, recurrent, unspecified: Secondary | ICD-10-CM | POA: Diagnosis not present

## 2019-11-11 DIAGNOSIS — N183 Chronic kidney disease, stage 3 unspecified: Secondary | ICD-10-CM | POA: Diagnosis not present

## 2019-11-11 DIAGNOSIS — E1122 Type 2 diabetes mellitus with diabetic chronic kidney disease: Secondary | ICD-10-CM | POA: Diagnosis not present

## 2019-11-11 DIAGNOSIS — Z853 Personal history of malignant neoplasm of breast: Secondary | ICD-10-CM | POA: Diagnosis not present

## 2019-11-11 DIAGNOSIS — I10 Essential (primary) hypertension: Secondary | ICD-10-CM | POA: Diagnosis not present

## 2019-12-11 DIAGNOSIS — Z23 Encounter for immunization: Secondary | ICD-10-CM | POA: Diagnosis not present

## 2019-12-29 DIAGNOSIS — E7800 Pure hypercholesterolemia, unspecified: Secondary | ICD-10-CM | POA: Diagnosis not present

## 2019-12-29 DIAGNOSIS — I5033 Acute on chronic diastolic (congestive) heart failure: Secondary | ICD-10-CM | POA: Diagnosis not present

## 2019-12-29 DIAGNOSIS — I2609 Other pulmonary embolism with acute cor pulmonale: Secondary | ICD-10-CM | POA: Diagnosis not present

## 2019-12-29 DIAGNOSIS — Z853 Personal history of malignant neoplasm of breast: Secondary | ICD-10-CM | POA: Diagnosis not present

## 2019-12-29 DIAGNOSIS — I350 Nonrheumatic aortic (valve) stenosis: Secondary | ICD-10-CM | POA: Diagnosis not present

## 2019-12-29 DIAGNOSIS — Z794 Long term (current) use of insulin: Secondary | ICD-10-CM | POA: Diagnosis not present

## 2019-12-29 DIAGNOSIS — N1831 Chronic kidney disease, stage 3a: Secondary | ICD-10-CM | POA: Diagnosis not present

## 2019-12-29 DIAGNOSIS — Z8543 Personal history of malignant neoplasm of ovary: Secondary | ICD-10-CM | POA: Diagnosis not present

## 2019-12-29 DIAGNOSIS — E1122 Type 2 diabetes mellitus with diabetic chronic kidney disease: Secondary | ICD-10-CM | POA: Diagnosis not present

## 2019-12-29 DIAGNOSIS — Z86718 Personal history of other venous thrombosis and embolism: Secondary | ICD-10-CM | POA: Diagnosis not present

## 2019-12-29 DIAGNOSIS — C50212 Malignant neoplasm of upper-inner quadrant of left female breast: Secondary | ICD-10-CM | POA: Diagnosis not present

## 2019-12-29 DIAGNOSIS — E559 Vitamin D deficiency, unspecified: Secondary | ICD-10-CM | POA: Diagnosis not present

## 2019-12-29 DIAGNOSIS — C562 Malignant neoplasm of left ovary: Secondary | ICD-10-CM | POA: Diagnosis not present

## 2019-12-29 DIAGNOSIS — Z170 Estrogen receptor positive status [ER+]: Secondary | ICD-10-CM | POA: Diagnosis not present

## 2019-12-29 DIAGNOSIS — F419 Anxiety disorder, unspecified: Secondary | ICD-10-CM | POA: Diagnosis not present

## 2019-12-29 DIAGNOSIS — E1169 Type 2 diabetes mellitus with other specified complication: Secondary | ICD-10-CM | POA: Diagnosis not present

## 2019-12-29 DIAGNOSIS — D649 Anemia, unspecified: Secondary | ICD-10-CM | POA: Diagnosis not present

## 2019-12-29 DIAGNOSIS — F339 Major depressive disorder, recurrent, unspecified: Secondary | ICD-10-CM | POA: Diagnosis not present

## 2019-12-29 DIAGNOSIS — I10 Essential (primary) hypertension: Secondary | ICD-10-CM | POA: Diagnosis not present

## 2019-12-29 DIAGNOSIS — Z6841 Body Mass Index (BMI) 40.0 and over, adult: Secondary | ICD-10-CM | POA: Diagnosis not present

## 2019-12-29 DIAGNOSIS — Z7901 Long term (current) use of anticoagulants: Secondary | ICD-10-CM | POA: Diagnosis not present

## 2019-12-30 DIAGNOSIS — D649 Anemia, unspecified: Secondary | ICD-10-CM | POA: Insufficient documentation

## 2020-01-05 DIAGNOSIS — D649 Anemia, unspecified: Secondary | ICD-10-CM | POA: Diagnosis not present

## 2020-01-12 DIAGNOSIS — D649 Anemia, unspecified: Secondary | ICD-10-CM | POA: Diagnosis not present

## 2020-01-14 DIAGNOSIS — Z23 Encounter for immunization: Secondary | ICD-10-CM | POA: Diagnosis not present

## 2020-01-14 DIAGNOSIS — E7800 Pure hypercholesterolemia, unspecified: Secondary | ICD-10-CM | POA: Diagnosis not present

## 2020-01-14 DIAGNOSIS — M170 Bilateral primary osteoarthritis of knee: Secondary | ICD-10-CM | POA: Diagnosis not present

## 2020-01-14 DIAGNOSIS — E559 Vitamin D deficiency, unspecified: Secondary | ICD-10-CM | POA: Diagnosis not present

## 2020-01-14 DIAGNOSIS — E1169 Type 2 diabetes mellitus with other specified complication: Secondary | ICD-10-CM | POA: Diagnosis not present

## 2020-01-19 ENCOUNTER — Ambulatory Visit (INDEPENDENT_AMBULATORY_CARE_PROVIDER_SITE_OTHER): Payer: Medicare Other

## 2020-01-19 DIAGNOSIS — I442 Atrioventricular block, complete: Secondary | ICD-10-CM

## 2020-01-20 LAB — CUP PACEART REMOTE DEVICE CHECK
Battery Remaining Longevity: 120 mo
Battery Remaining Percentage: 95.5 %
Battery Voltage: 2.99 V
Brady Statistic AP VP Percent: 3.2 %
Brady Statistic AP VS Percent: 1 %
Brady Statistic AS VP Percent: 96 %
Brady Statistic AS VS Percent: 1 %
Brady Statistic RA Percent Paced: 2.6 %
Brady Statistic RV Percent Paced: 99 %
Date Time Interrogation Session: 20211115020013
Implantable Lead Implant Date: 20180216
Implantable Lead Implant Date: 20180216
Implantable Lead Location: 753859
Implantable Lead Location: 753860
Implantable Pulse Generator Implant Date: 20180216
Lead Channel Impedance Value: 430 Ohm
Lead Channel Impedance Value: 430 Ohm
Lead Channel Pacing Threshold Amplitude: 0.5 V
Lead Channel Pacing Threshold Amplitude: 0.75 V
Lead Channel Pacing Threshold Pulse Width: 0.5 ms
Lead Channel Pacing Threshold Pulse Width: 0.5 ms
Lead Channel Sensing Intrinsic Amplitude: 12 mV
Lead Channel Sensing Intrinsic Amplitude: 2 mV
Lead Channel Setting Pacing Amplitude: 1 V
Lead Channel Setting Pacing Amplitude: 2 V
Lead Channel Setting Pacing Pulse Width: 0.5 ms
Lead Channel Setting Sensing Sensitivity: 2.5 mV
Pulse Gen Model: 2272
Pulse Gen Serial Number: 7998131

## 2020-01-20 NOTE — Progress Notes (Signed)
Remote pacemaker transmission.   

## 2020-01-22 ENCOUNTER — Telehealth: Payer: Self-pay | Admitting: General Practice

## 2020-01-22 DIAGNOSIS — I10 Essential (primary) hypertension: Secondary | ICD-10-CM

## 2020-01-22 DIAGNOSIS — I2699 Other pulmonary embolism without acute cor pulmonale: Secondary | ICD-10-CM

## 2020-01-22 DIAGNOSIS — Z01812 Encounter for preprocedural laboratory examination: Secondary | ICD-10-CM

## 2020-01-22 NOTE — Telephone Encounter (Signed)
New Message:    Pt says she would like to talk to [employer]. She says she needs to ask hi som questions please.

## 2020-01-22 NOTE — Telephone Encounter (Signed)
[name] contacted nurse triage line this morning and asked if we would be willing to order a follow-up chest CT for her pulmonary embolus that was found 07/18/2019.  She has not missed any doses of apixaban.  PE was unprovoked.  She recently had follow-up with hematology oncology/[facility].  Provider encouraged her to contact cardiology.  She states that her PCP Dr. Drema Dallas is retiring as well and she is in the process of being transitioned to a new PCP.    I informed her that I would reach out to you to get your input.  We discussed that due to PE being unprovoked she would most likely be on apixaban long-term.  She expressed understanding.  She otherwise feels well has no complaints at this time.  She has follow-up with you on 02/02/2020.   Thank you for your help.  Jossie Ng. Lennox Leikam NP-C    01/22/2020, 12:04 PM [place] [address] Office [phone] Fax [phone]

## 2020-01-26 NOTE — Telephone Encounter (Signed)
This isn't typically indicated unless she is having symptoms.  Given that her PE was massive with signs of RV strain, it is reasonable to repeat chest CT-a to assess for resolution.

## 2020-01-27 NOTE — Telephone Encounter (Signed)
Advised patient, verbalized understanding Patient will have labs within 1 week of CT Patient scheduled 12/14 arrive 12:20 for 12:40 [place] location  No food 4 hrs liquids ok

## 2020-02-02 ENCOUNTER — Ambulatory Visit (INDEPENDENT_AMBULATORY_CARE_PROVIDER_SITE_OTHER): Payer: Medicare Other | Admitting: Cardiovascular Disease

## 2020-02-02 ENCOUNTER — Other Ambulatory Visit: Payer: Self-pay

## 2020-02-02 ENCOUNTER — Encounter: Payer: Self-pay | Admitting: Cardiovascular Disease

## 2020-02-02 VITALS — BP 128/66 | HR 74 | Ht 67.0 in | Wt 262.0 lb

## 2020-02-02 DIAGNOSIS — Z5181 Encounter for therapeutic drug level monitoring: Secondary | ICD-10-CM

## 2020-02-02 DIAGNOSIS — I10 Essential (primary) hypertension: Secondary | ICD-10-CM

## 2020-02-02 DIAGNOSIS — Z952 Presence of prosthetic heart valve: Secondary | ICD-10-CM | POA: Diagnosis not present

## 2020-02-02 DIAGNOSIS — I2699 Other pulmonary embolism without acute cor pulmonale: Secondary | ICD-10-CM

## 2020-02-02 DIAGNOSIS — I2609 Other pulmonary embolism with acute cor pulmonale: Secondary | ICD-10-CM | POA: Diagnosis not present

## 2020-02-02 DIAGNOSIS — I442 Atrioventricular block, complete: Secondary | ICD-10-CM

## 2020-02-02 MED ORDER — APIXABAN 2.5 MG PO TABS: 2.5000 mg | ORAL_TABLET | Freq: Two times a day (BID) | ORAL | 11 refills | Status: DC

## 2020-02-02 NOTE — Progress Notes (Signed)
Cardiology Office Note   Date:  02/02/2020   ID:  [name], DOB 03-02-1936, MRN [id number]  PCP:  Leighton Ruff, MD  Cardiologist:   Skeet Latch, MD  Electrophysiologist: Allegra Lai, MD  No chief complaint on file.    History of Present Illness: [name] is a 84 y.o. female with hypertension, diabetes, prior DVT/PE, severe aortic stenosis s/p TAVR, complete heart block status post pacemaker, breast cancer status post left lumpectomy, and morbid obesity who presents for follow-up.  [name] was admitted to the hospital 04/2016 with symptomatic bradycardia. She was noted to be in complete heart block with a ventricular rate in the 30s. Dr. Curt Bears implanted a St. Jude dual-chamber pacemaker on 04/21/16.  She had an echocardiogram 04/21/16 that revealed LVEF 60-65% with grade 1 diastolic dysfunction. She also had moderate aortic stenosis with a mean gradient of 33 mmHg. She was noted to have moderate mitral stenosis due to mitral annular calcification as well. The mean gradient 7 mmHg with a valve area by pressure half-time of 1.86 cm.  BNP was 612.    She had a repeat echocardiogram 05/2017 that revealed LVEF 55-60% and grade 1 diastolic dysfunction.  She was found to have critical aortic stenosis with a mean gradient of 62 mmHg.  She was referred to Dr. Burt Knack and underwent implantation of a 15mm Edwards Sapien 3 TAVR bioprosthesis 07/17/17.  Postoperative echo revealed normal systolic function and a mean valve gradient of 15 mmHg.  She required diuresis after the operation but was otherwise uneventful.  Her husband passed away Feb 22, 2018.  She and her sister now live together and she no longer drives.   Since her last appointment she was treated for PE 07/2019.  There was evidence of right heart strain on her CT scan.  At the time she is actually being treated for urinary tract infection and was noted to be hypoxic.  On further exam she was found to have a submassive pulmonary  embolism.  She was started on Eliquis with plans to continue it indefinitely for prophylaxis given that the DVT was unprovoked.  She was treated with compression stockings and has had improvement in her lower extremity edema.  She denies orthopnea or PND.  She sleeps in a recliner chronically due to back pain.  She has not had any chest pain or pressure.  She does some chair exercises but is unable to do more due to chronic pain.  At home her blood pressure has been well-controlled in the 110s to 120s over 60s.  She generally feels well and was without complaint.   Past Medical History:  Diagnosis Date  . Anxiety   . Aortic stenosis   . Arthritis   . Breast cancer [facility])    CANCER  . Diabetes mellitus    Type II  . Dyspnea   . Heart murmur   . History of chemotherapy   . History of kidney stones   . Hypertension   . Mitral stenosis   . Morbid obesity (HCC)    BMI > 40  . Ovarian cancer ([place])   . Presence of permanent cardiac pacemaker    St. Jude  . Pulmonary embolism ([place]) 2013   post op  . 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 Spinal stenosis     Past Surgical History:  Procedure Laterality Date  . ABDOMINAL HYSTERECTOMY  2013  . BREAST SURGERY  03-2004   lumpectomy   . CARDIAC CATHETERIZATION    . INSERT / REPLACE / REMOVE PACEMAKER    . INTRAOPERATIVE TRANSTHORACIC ECHOCARDIOGRAM  07/17/2017   Procedure: INTRAOPERATIVE TRANSTHORACIC ECHOCARDIOGRAM;  Surgeon: Sherren Mocha, MD;  Location: [place];  Service: Open Heart Surgery;;  . LAPAROTOMY N/A 03/22/2018   Procedure: grahams pouch, repair of bowel perforation;  Surgeon: Kinsinger, Arta Bruce, MD;  Location: [place];  Service: General;  Laterality: N/A;  . LITHOTRIPSY    . PACEMAKER IMPLANT N/A 04/21/2016   Procedure: Pacemaker Implant;  Surgeon: Will Meredith Leeds, MD;  Location: [place] CV  LAB;  Service: Cardiovascular;  Laterality: N/A;  . TONSILLECTOMY    . TRANSCATHETER AORTIC VALVE REPLACEMENT, TRANSFEMORAL N/A 07/17/2017   Procedure: TRANSCATHETER AORTIC VALVE REPLACEMENT, TRANSFEMORAL;  Surgeon: Sherren Mocha, MD;  Location: [place];  Service: Open Heart Surgery;  Laterality: N/A;     Current Outpatient Medications  Medication Sig Dispense Refill  . atorvastatin (LIPITOR) 20 MG tablet Take 20 mg by mouth at bedtime.     . Calcium Citrate-Vitamin D (CALCIUM CITRATE + PO) Take 1 tablet by mouth 2 (two) times daily.     . cholecalciferol (VITAMIN D) 1000 units tablet Take 1,000 Units by mouth 2 (two) times daily.     . fish oil-omega-3 fatty acids 1000 MG capsule Take 1 g by mouth every evening.     [name] Kitchen FLUoxetine (PROZAC) 40 MG capsule Take 40 mg by mouth daily.     . furosemide (LASIX) 40 MG tablet Take 40 mg (1 tab) by mouth twice daily every Mon., Wed, Fri., and Sun. Take 40 mg once daily on Tues., Thurs., and Sat. 132 tablet 1  . LEVEMIR FLEXTOUCH 100 UNIT/ML Pen Inject 60-70 Units into the skin 2 (two) times daily. Per sliding scale    . Magnesium 400 MG CAPS Take 400 mg by mouth at bedtime.     . metFORMIN (GLUCOPHAGE) 1000 MG tablet Take 1,000 mg by mouth 2 (two) times daily with a meal.     . Multiple Vitamin (MULTIVITAMIN) tablet Take 1 tablet by mouth daily.      [name] Kitchen olmesartan (BENICAR) 5 MG tablet Take by mouth.    . pantoprazole (PROTONIX) 40 MG tablet Take 1 tablet (40 mg total) by mouth 2 (two) times daily. 30 tablet 0  . saccharomyces boulardii (FLORASTOR) 250 MG capsule Take 1 capsule (250 mg total) by mouth 2 (two) times daily. You can find a probiotic over the counter. Take this while you are on antibiotics.    [name] Kitchen apixaban (ELIQUIS) 2.5 MG TABS tablet Take 1 tablet (2.5 mg total) by mouth 2 (two) times daily. 60 tablet 11   No current facility-administered medications for this visit.    Allergies:   Nsaids and Tape    Social History:  The patient  reports  that she has quit smoking. Her smoking use included cigarettes. She has a 5.00 pack-year smoking history. She has never used smokeless tobacco. She reports that she does not drink alcohol and does not use drugs.   Family History:  The patient's family history includes Bladder Cancer in her father; Cancer in her brother and sister; Heart failure in her mother.    ROS:  Please see the history of present illness.   Otherwise, review of systems are positive for none.   All other systems are reviewed and negative.    PHYSICAL EXAM: VS:  BP 128/66  Pulse 74   Ht 5\' 7"  (1.702 m)   Wt 262 lb (118.8 kg)   SpO2 99%   BMI 41.04 kg/m  , BMI Body mass index is 41.04 kg/m. GENERAL:  Well appearing HEENT: Pupils equal round and reactive, fundi not visualized, oral mucosa unremarkable NECK:  No jugular venous distention, waveform within normal limits, carotid upstroke brisk and symmetric, no bruits LUNGS:  Clear to auscultation bilaterally HEART:  RRR.  PMI not displaced or sustained,S1 and S2 within normal limits, no S3, no S4, no clicks, no rubs, II/VI systolic murmur at the LUSB ABD:  Flat, positive bowel sounds normal in frequency in pitch, no bruits, no rebound, no guarding, no midline pulsatile mass, no hepatomegaly, no splenomegaly EXT:  2 plus pulses throughout, 1+ LE edema, no cyanosis no clubbing SKIN:  No rashes no nodules NEURO:  Cranial nerves II through XII grossly intact, motor grossly intact throughout PSYCH:  Cognitively intact, oriented to person place and time   EKG:  EKG is not ordered today. 03/19/18: Sinus rhythm.  VP.  Rate 62 bpm.    Echo 04/21/16: Study Conclusions  - Left ventricle: The cavity size was normal. Wall thickness was   increased in a pattern of severe LVH. Systolic function was   normal. The estimated ejection fraction was in the range of 60%   to 65%. Wall motion was normal; there were no regional wall   motion abnormalities. Doppler parameters are  consistent with   abnormal left ventricular relaxation (grade 1 diastolic   dysfunction). - Aortic valve: Moderately to severely calcified annulus. Severely   thickened, severely calcified leaflets. There was moderate to   severe stenosis. There was trivial regurgitation. - Mitral valve: Severely calcified annulus. Severely calcified   leaflets . The findings are consistent with moderate stenosis.   There was mild to moderate regurgitation. Valve area by pressure   half-time: 1.86 cm^2. - Left atrium: The atrium was mildly dilated.  Echo 05/14/17: Study Conclusions  - Left ventricle: The cavity size was normal. There was moderate   concentric hypertrophy. Systolic function was normal. The   estimated ejection fraction was in the range of 55% to 60%. Wall   motion was normal; there were no regional wall motion   abnormalities. Doppler parameters are consistent with abnormal   left ventricular relaxation (grade 1 diastolic dysfunction).   Doppler parameters are consistent with high ventricular filling   pressure. - Aortic valve: Probably trileaflet; severely thickened, severely   calcified leaflets. Valve mobility was restricted. There was   critical stenosis. There was no regurgitation. Peak velocity (S):   478 cm/s. Mean gradient (S): 62 mm Hg. Valve area (VTI): 0.57   cm^2. Valve area (Vmax): 0.65 cm^2. Valve area (Vmean): 0.51   cm^2. - Mitral valve: Severely calcified annulus. Mobility was   restricted. The findings are consistent with mild stenosis. There   was mild regurgitation. Valve area by pressure half-time: 1.93   cm^2. Valve area by continuity equation (using LVOT flow): 1.43   cm^2. - Left atrium: The atrium was severely dilated. - Right ventricle: The cavity size was normal. Wall thickness was   normal. Systolic function was normal. - Tricuspid valve: There was mild regurgitation. - Pulmonary arteries: Systolic pressure was mildly increased. PA   peak pressure: 44  mm Hg (S).  Recent Labs: 07/18/2019: B Natriuretic Peptide 261.6 07/20/2019: ALT 22; BUN 22; Creatinine, Ser 1.09; Magnesium 2.2; Potassium 3.7; Sodium 137 07/21/2019: Hemoglobin 11.8; Platelets 181  Lipid Panel    Component Value Date/Time   CHOL 126 11/01/2017 1213   TRIG 127 11/01/2017 1213   HDL 58 11/01/2017 1213   CHOLHDL 2.2 11/01/2017 1213   LDLCALC 43 11/01/2017 1213      Wt Readings from Last 3 Encounters:  02/02/20 262 lb (118.8 kg)  10/27/19 265 lb (120.2 kg)  07/19/19 273 lb 9.5 oz (124.1 kg)      ASSESSMENT AND PLAN:  # Prior PE: Episode was unprovoked and she was minimally symptomatic.  She is currently doing well and no longer hypoxic.  There was right heart strain on her CT.  She does continue to have some lower extremity edema.  We will get an echocardiogram to assess for resolution of her right heart dysfunction.  Continue Lasix and compression stockings.  We will reduce her Eliquis to 2.5 mg twice daily for lifetime prophylaxis given that her episode was.  Suspect this occurred due to her relative physical inactivity.  # Complete heart block s/p PPM: [name] is doing well.  Managed by Dr. Curt Bears.   # Severe aortic stenosis:   # S/p TAVR: [name] is doing well post-TAVR.  Mean gradient 16 mmHg on 08/2017.    # Acute on chronic diastolic heart failure:  # Moderate mitral stenosis: # Hypertension:  Volume status is stable.  BP is well-controlled. Continue olmesartan and lasix.  Repeat echo on follow up.  # Hyperlipidemia:  Continue atorvastatin.  LDL was 36 on 01/2020.    Current medicines are reviewed at length with the patient today.  The patient does not have concerns regarding medicines.   The following changes have been made:  no change  Labs/ tests ordered today include:   Orders Placed This Encounter  Procedures  . Lipid panel  . Comprehensive metabolic panel  . ECHOCARDIOGRAM COMPLETE     Disposition:   FU with Nitisha Civello C.  Oval Linsey, MD, [facility] in 1 year.    Signed, Mckoy Bhakta C. Oval Linsey, [place], [facility]  02/02/2020 5:30 PM    [place]

## 2020-02-02 NOTE — Patient Instructions (Signed)
Medication Instructions:  DECREASE YOUR ELIQUIS TO 2.5 MG TWICE A DAY   *If you need a refill on your cardiac medications before your next appointment, please call your pharmacy*  Lab Work: FASTING LP/CMET IN 6 MONTHS   If you have labs (blood work) drawn today and your tests are completely normal, you will receive your results only by:  [place] (if you have MyChart) OR  A paper copy in the mail If you have any lab test that is abnormal or we need to change your treatment, we will call you to review the results.  Testing/Procedures: Your physician has requested that you have an echocardiogram. Echocardiography is a painless test that uses sound waves to create images of your heart. It provides your doctor with information about the size and shape of your heart and how well your hearts chambers and valves are working. This procedure takes approximately one hour. There are no restrictions for this procedure. [place] [address]    Follow-Up: At [facility], you and your health needs are our priority.  As part of our continuing mission to provide you with exceptional heart care, we have created designated Provider Care Teams.  These Care Teams include your primary Cardiologist (physician) and Advanced Practice Providers (APPs -  Physician Assistants and Nurse Practitioners) who all work together to provide you with the care you need, when you need it.  We recommend signing up for the patient portal called "MyChart".  Sign up information is provided on this After Visit Summary.  MyChart is used to connect with patients for Virtual Visits (Telemedicine).  Patients are able to view lab/test results, encounter notes, upcoming appointments, etc.  Non-urgent messages can be sent to your provider as well.   To learn more about what you can do with MyChart, go to [email or web address].    Your next appointment:   6 month(s)  The format for your next appointment:    In Person  Provider:   You may see Skeet Latch, MD or one of the following Advanced Practice Providers on your designated Care Team:    Kerin Ransom, PA-C  [place], [place]  Coletta Memos, [place]

## 2020-02-06 ENCOUNTER — Ambulatory Visit (HOSPITAL_COMMUNITY)
Admission: RE | Admit: 2020-02-06 | Discharge: 2020-02-06 | Disposition: A | Discharge status: Home / Self Care | Payer: Medicare Other | Source: Ambulatory Visit | Attending: Cardiovascular Disease | Admitting: Cardiovascular Disease

## 2020-02-06 ENCOUNTER — Other Ambulatory Visit: Payer: Self-pay

## 2020-02-06 DIAGNOSIS — I10 Essential (primary) hypertension: Secondary | ICD-10-CM | POA: Insufficient documentation

## 2020-02-06 DIAGNOSIS — I2699 Other pulmonary embolism without acute cor pulmonale: Secondary | ICD-10-CM | POA: Insufficient documentation

## 2020-02-06 DIAGNOSIS — E119 Type 2 diabetes mellitus without complications: Secondary | ICD-10-CM | POA: Insufficient documentation

## 2020-02-06 DIAGNOSIS — I080 Rheumatic disorders of both mitral and aortic valves: Secondary | ICD-10-CM | POA: Diagnosis not present

## 2020-02-06 LAB — ECHOCARDIOGRAM COMPLETE
AR max vel: 1.2 cm2
AV Area VTI: 1.32 cm2
AV Area mean vel: 1.37 cm2
AV Mean grad: 17.5 mmHg
AV Peak grad: 33.9 mmHg
Ao pk vel: 2.91 m/s
Area-P 1/2: 2.18 cm2
P 1/2 time: 97 msec
S' Lateral: 2.8 cm

## 2020-02-06 NOTE — Progress Notes (Signed)
  Echocardiogram 2D Echocardiogram has been performed.  [name] 02/06/2020, 9:53 AM

## 2020-02-09 DIAGNOSIS — D649 Anemia, unspecified: Secondary | ICD-10-CM | POA: Diagnosis not present

## 2020-02-13 DIAGNOSIS — M1711 Unilateral primary osteoarthritis, right knee: Secondary | ICD-10-CM | POA: Diagnosis not present

## 2020-02-13 DIAGNOSIS — M1712 Unilateral primary osteoarthritis, left knee: Secondary | ICD-10-CM | POA: Diagnosis not present

## 2020-02-13 DIAGNOSIS — M25551 Pain in right hip: Secondary | ICD-10-CM | POA: Diagnosis not present

## 2020-02-13 DIAGNOSIS — M1611 Unilateral primary osteoarthritis, right hip: Secondary | ICD-10-CM | POA: Diagnosis not present

## 2020-02-17 ENCOUNTER — Other Ambulatory Visit: Payer: Medicare Other

## 2020-02-20 DIAGNOSIS — M1712 Unilateral primary osteoarthritis, left knee: Secondary | ICD-10-CM | POA: Diagnosis not present

## 2020-02-20 DIAGNOSIS — M1711 Unilateral primary osteoarthritis, right knee: Secondary | ICD-10-CM | POA: Diagnosis not present

## 2020-03-01 DIAGNOSIS — M25562 Pain in left knee: Secondary | ICD-10-CM | POA: Diagnosis not present

## 2020-03-01 DIAGNOSIS — M1712 Unilateral primary osteoarthritis, left knee: Secondary | ICD-10-CM | POA: Diagnosis not present

## 2020-03-01 DIAGNOSIS — M25561 Pain in right knee: Secondary | ICD-10-CM | POA: Diagnosis not present

## 2020-03-01 DIAGNOSIS — M1711 Unilateral primary osteoarthritis, right knee: Secondary | ICD-10-CM | POA: Diagnosis not present

## 2020-03-30 DIAGNOSIS — E1122 Type 2 diabetes mellitus with diabetic chronic kidney disease: Secondary | ICD-10-CM | POA: Diagnosis not present

## 2020-03-30 DIAGNOSIS — I5033 Acute on chronic diastolic (congestive) heart failure: Secondary | ICD-10-CM | POA: Diagnosis not present

## 2020-03-30 DIAGNOSIS — F339 Major depressive disorder, recurrent, unspecified: Secondary | ICD-10-CM | POA: Diagnosis not present

## 2020-03-30 DIAGNOSIS — E7800 Pure hypercholesterolemia, unspecified: Secondary | ICD-10-CM | POA: Diagnosis not present

## 2020-03-30 DIAGNOSIS — I10 Essential (primary) hypertension: Secondary | ICD-10-CM | POA: Diagnosis not present

## 2020-03-30 DIAGNOSIS — Z853 Personal history of malignant neoplasm of breast: Secondary | ICD-10-CM | POA: Diagnosis not present

## 2020-03-30 DIAGNOSIS — E1169 Type 2 diabetes mellitus with other specified complication: Secondary | ICD-10-CM | POA: Diagnosis not present

## 2020-03-30 DIAGNOSIS — N183 Chronic kidney disease, stage 3 unspecified: Secondary | ICD-10-CM | POA: Diagnosis not present

## 2020-04-19 ENCOUNTER — Ambulatory Visit (INDEPENDENT_AMBULATORY_CARE_PROVIDER_SITE_OTHER): Payer: Medicare Other

## 2020-04-19 DIAGNOSIS — I442 Atrioventricular block, complete: Secondary | ICD-10-CM

## 2020-04-19 LAB — CUP PACEART REMOTE DEVICE CHECK
Battery Remaining Longevity: 120 mo
Battery Remaining Percentage: 95.5 %
Battery Voltage: 2.99 V
Brady Statistic AP VP Percent: 2.5 %
Brady Statistic AP VS Percent: 1 %
Brady Statistic AS VP Percent: 97 %
Brady Statistic AS VS Percent: 1 %
Brady Statistic RA Percent Paced: 2 %
Brady Statistic RV Percent Paced: 99 %
Date Time Interrogation Session: 20220214020015
Implantable Lead Implant Date: 20180216
Implantable Lead Implant Date: 20180216
Implantable Lead Location: 753859
Implantable Lead Location: 753860
Implantable Pulse Generator Implant Date: 20180216
Lead Channel Impedance Value: 410 Ohm
Lead Channel Impedance Value: 430 Ohm
Lead Channel Pacing Threshold Amplitude: 0.5 V
Lead Channel Pacing Threshold Amplitude: 0.75 V
Lead Channel Pacing Threshold Pulse Width: 0.5 ms
Lead Channel Pacing Threshold Pulse Width: 0.5 ms
Lead Channel Sensing Intrinsic Amplitude: 1.5 mV
Lead Channel Sensing Intrinsic Amplitude: 12 mV
Lead Channel Setting Pacing Amplitude: 1 V
Lead Channel Setting Pacing Amplitude: 2 V
Lead Channel Setting Pacing Pulse Width: 0.5 ms
Lead Channel Setting Sensing Sensitivity: 2.5 mV
Pulse Gen Model: 2272
Pulse Gen Serial Number: 7998131

## 2020-04-26 DIAGNOSIS — Z1389 Encounter for screening for other disorder: Secondary | ICD-10-CM | POA: Diagnosis not present

## 2020-04-26 DIAGNOSIS — Z1231 Encounter for screening mammogram for malignant neoplasm of breast: Secondary | ICD-10-CM | POA: Diagnosis not present

## 2020-04-26 DIAGNOSIS — Z0000 Encounter for general adult medical examination without abnormal findings: Secondary | ICD-10-CM | POA: Diagnosis not present

## 2020-04-26 DIAGNOSIS — Z23 Encounter for immunization: Secondary | ICD-10-CM | POA: Diagnosis not present

## 2020-04-26 NOTE — Progress Notes (Signed)
Remote pacemaker transmission.   

## 2020-05-10 DIAGNOSIS — Z853 Personal history of malignant neoplasm of breast: Secondary | ICD-10-CM | POA: Diagnosis not present

## 2020-05-10 DIAGNOSIS — I10 Essential (primary) hypertension: Secondary | ICD-10-CM | POA: Diagnosis not present

## 2020-05-10 DIAGNOSIS — F339 Major depressive disorder, recurrent, unspecified: Secondary | ICD-10-CM | POA: Diagnosis not present

## 2020-05-10 DIAGNOSIS — I5033 Acute on chronic diastolic (congestive) heart failure: Secondary | ICD-10-CM | POA: Diagnosis not present

## 2020-05-10 DIAGNOSIS — E1169 Type 2 diabetes mellitus with other specified complication: Secondary | ICD-10-CM | POA: Diagnosis not present

## 2020-05-10 DIAGNOSIS — E7800 Pure hypercholesterolemia, unspecified: Secondary | ICD-10-CM | POA: Diagnosis not present

## 2020-05-10 DIAGNOSIS — N183 Chronic kidney disease, stage 3 unspecified: Secondary | ICD-10-CM | POA: Diagnosis not present

## 2020-05-28 ENCOUNTER — Other Ambulatory Visit: Payer: Self-pay

## 2020-05-28 MED ORDER — FUROSEMIDE 40 MG PO TABS: ORAL_TABLET | ORAL | 3 refills | Status: DC

## 2020-06-16 DIAGNOSIS — I442 Atrioventricular block, complete: Secondary | ICD-10-CM | POA: Diagnosis not present

## 2020-06-16 DIAGNOSIS — R29818 Other symptoms and signs involving the nervous system: Secondary | ICD-10-CM | POA: Diagnosis not present

## 2020-06-16 DIAGNOSIS — E1169 Type 2 diabetes mellitus with other specified complication: Secondary | ICD-10-CM | POA: Diagnosis not present

## 2020-06-16 DIAGNOSIS — Z86711 Personal history of pulmonary embolism: Secondary | ICD-10-CM | POA: Diagnosis not present

## 2020-06-16 DIAGNOSIS — Z7901 Long term (current) use of anticoagulants: Secondary | ICD-10-CM | POA: Diagnosis not present

## 2020-06-16 DIAGNOSIS — Z950 Presence of cardiac pacemaker: Secondary | ICD-10-CM | POA: Diagnosis not present

## 2020-06-16 DIAGNOSIS — F3341 Major depressive disorder, recurrent, in partial remission: Secondary | ICD-10-CM | POA: Diagnosis not present

## 2020-06-16 DIAGNOSIS — Z952 Presence of prosthetic heart valve: Secondary | ICD-10-CM | POA: Diagnosis not present

## 2020-06-16 DIAGNOSIS — I5033 Acute on chronic diastolic (congestive) heart failure: Secondary | ICD-10-CM | POA: Diagnosis not present

## 2020-06-16 DIAGNOSIS — Z853 Personal history of malignant neoplasm of breast: Secondary | ICD-10-CM | POA: Diagnosis not present

## 2020-06-16 DIAGNOSIS — R5381 Other malaise: Secondary | ICD-10-CM | POA: Diagnosis not present

## 2020-06-16 DIAGNOSIS — I10 Essential (primary) hypertension: Secondary | ICD-10-CM | POA: Diagnosis not present

## 2020-06-16 DIAGNOSIS — E7800 Pure hypercholesterolemia, unspecified: Secondary | ICD-10-CM | POA: Diagnosis not present

## 2020-06-16 DIAGNOSIS — M170 Bilateral primary osteoarthritis of knee: Secondary | ICD-10-CM | POA: Diagnosis not present

## 2020-06-19 DIAGNOSIS — Z6841 Body Mass Index (BMI) 40.0 and over, adult: Secondary | ICD-10-CM | POA: Diagnosis not present

## 2020-06-19 DIAGNOSIS — Z950 Presence of cardiac pacemaker: Secondary | ICD-10-CM | POA: Diagnosis not present

## 2020-06-19 DIAGNOSIS — F419 Anxiety disorder, unspecified: Secondary | ICD-10-CM | POA: Diagnosis not present

## 2020-06-19 DIAGNOSIS — G8929 Other chronic pain: Secondary | ICD-10-CM | POA: Diagnosis not present

## 2020-06-19 DIAGNOSIS — Z853 Personal history of malignant neoplasm of breast: Secondary | ICD-10-CM | POA: Diagnosis not present

## 2020-06-19 DIAGNOSIS — F3341 Major depressive disorder, recurrent, in partial remission: Secondary | ICD-10-CM | POA: Diagnosis not present

## 2020-06-19 DIAGNOSIS — Z952 Presence of prosthetic heart valve: Secondary | ICD-10-CM | POA: Diagnosis not present

## 2020-06-19 DIAGNOSIS — Z7901 Long term (current) use of anticoagulants: Secondary | ICD-10-CM | POA: Diagnosis not present

## 2020-06-19 DIAGNOSIS — Z79891 Long term (current) use of opiate analgesic: Secondary | ICD-10-CM | POA: Diagnosis not present

## 2020-06-19 DIAGNOSIS — Z86711 Personal history of pulmonary embolism: Secondary | ICD-10-CM | POA: Diagnosis not present

## 2020-06-19 DIAGNOSIS — E669 Obesity, unspecified: Secondary | ICD-10-CM | POA: Diagnosis not present

## 2020-06-19 DIAGNOSIS — I5032 Chronic diastolic (congestive) heart failure: Secondary | ICD-10-CM | POA: Diagnosis not present

## 2020-06-19 DIAGNOSIS — Z794 Long term (current) use of insulin: Secondary | ICD-10-CM | POA: Diagnosis not present

## 2020-06-19 DIAGNOSIS — E7800 Pure hypercholesterolemia, unspecified: Secondary | ICD-10-CM | POA: Diagnosis not present

## 2020-06-19 DIAGNOSIS — Z8543 Personal history of malignant neoplasm of ovary: Secondary | ICD-10-CM | POA: Diagnosis not present

## 2020-06-19 DIAGNOSIS — I110 Hypertensive heart disease with heart failure: Secondary | ICD-10-CM | POA: Diagnosis not present

## 2020-06-19 DIAGNOSIS — F0390 Unspecified dementia without behavioral disturbance: Secondary | ICD-10-CM | POA: Diagnosis not present

## 2020-06-19 DIAGNOSIS — E1140 Type 2 diabetes mellitus with diabetic neuropathy, unspecified: Secondary | ICD-10-CM | POA: Diagnosis not present

## 2020-06-19 DIAGNOSIS — I050 Rheumatic mitral stenosis: Secondary | ICD-10-CM | POA: Diagnosis not present

## 2020-06-19 DIAGNOSIS — N200 Calculus of kidney: Secondary | ICD-10-CM | POA: Diagnosis not present

## 2020-06-19 DIAGNOSIS — Z86010 Personal history of colonic polyps: Secondary | ICD-10-CM | POA: Diagnosis not present

## 2020-06-19 DIAGNOSIS — M4800 Spinal stenosis, site unspecified: Secondary | ICD-10-CM | POA: Diagnosis not present

## 2020-06-19 DIAGNOSIS — Z85828 Personal history of other malignant neoplasm of skin: Secondary | ICD-10-CM | POA: Diagnosis not present

## 2020-06-19 DIAGNOSIS — M170 Bilateral primary osteoarthritis of knee: Secondary | ICD-10-CM | POA: Diagnosis not present

## 2020-06-19 DIAGNOSIS — Z86718 Personal history of other venous thrombosis and embolism: Secondary | ICD-10-CM | POA: Diagnosis not present

## 2020-06-22 DIAGNOSIS — G8929 Other chronic pain: Secondary | ICD-10-CM | POA: Diagnosis not present

## 2020-06-22 DIAGNOSIS — E1140 Type 2 diabetes mellitus with diabetic neuropathy, unspecified: Secondary | ICD-10-CM | POA: Diagnosis not present

## 2020-06-22 DIAGNOSIS — M170 Bilateral primary osteoarthritis of knee: Secondary | ICD-10-CM | POA: Diagnosis not present

## 2020-06-22 DIAGNOSIS — F0390 Unspecified dementia without behavioral disturbance: Secondary | ICD-10-CM | POA: Diagnosis not present

## 2020-06-22 DIAGNOSIS — I110 Hypertensive heart disease with heart failure: Secondary | ICD-10-CM | POA: Diagnosis not present

## 2020-06-22 DIAGNOSIS — I5032 Chronic diastolic (congestive) heart failure: Secondary | ICD-10-CM | POA: Diagnosis not present

## 2020-06-24 ENCOUNTER — Telehealth: Payer: Self-pay | Admitting: Cardiovascular Disease

## 2020-06-24 DIAGNOSIS — I5032 Chronic diastolic (congestive) heart failure: Secondary | ICD-10-CM | POA: Diagnosis not present

## 2020-06-24 DIAGNOSIS — M170 Bilateral primary osteoarthritis of knee: Secondary | ICD-10-CM | POA: Diagnosis not present

## 2020-06-24 DIAGNOSIS — I110 Hypertensive heart disease with heart failure: Secondary | ICD-10-CM | POA: Diagnosis not present

## 2020-06-24 DIAGNOSIS — G8929 Other chronic pain: Secondary | ICD-10-CM | POA: Diagnosis not present

## 2020-06-24 DIAGNOSIS — E1140 Type 2 diabetes mellitus with diabetic neuropathy, unspecified: Secondary | ICD-10-CM | POA: Diagnosis not present

## 2020-06-24 DIAGNOSIS — F0390 Unspecified dementia without behavioral disturbance: Secondary | ICD-10-CM | POA: Diagnosis not present

## 2020-06-24 NOTE — Telephone Encounter (Signed)
   Pt would like to stay going to [facility] office, they would like to switch provider from Dr. Oval Linsey to Dr. Margaretann Loveless

## 2020-06-28 DIAGNOSIS — F0390 Unspecified dementia without behavioral disturbance: Secondary | ICD-10-CM | POA: Diagnosis not present

## 2020-06-28 DIAGNOSIS — M170 Bilateral primary osteoarthritis of knee: Secondary | ICD-10-CM | POA: Diagnosis not present

## 2020-06-28 DIAGNOSIS — F3341 Major depressive disorder, recurrent, in partial remission: Secondary | ICD-10-CM | POA: Diagnosis not present

## 2020-06-28 DIAGNOSIS — I10 Essential (primary) hypertension: Secondary | ICD-10-CM | POA: Diagnosis not present

## 2020-06-28 DIAGNOSIS — E1140 Type 2 diabetes mellitus with diabetic neuropathy, unspecified: Secondary | ICD-10-CM | POA: Diagnosis not present

## 2020-06-28 DIAGNOSIS — G8929 Other chronic pain: Secondary | ICD-10-CM | POA: Diagnosis not present

## 2020-06-28 DIAGNOSIS — I110 Hypertensive heart disease with heart failure: Secondary | ICD-10-CM | POA: Diagnosis not present

## 2020-06-28 DIAGNOSIS — E7800 Pure hypercholesterolemia, unspecified: Secondary | ICD-10-CM | POA: Diagnosis not present

## 2020-06-28 DIAGNOSIS — N183 Chronic kidney disease, stage 3 unspecified: Secondary | ICD-10-CM | POA: Diagnosis not present

## 2020-06-28 DIAGNOSIS — I5033 Acute on chronic diastolic (congestive) heart failure: Secondary | ICD-10-CM | POA: Diagnosis not present

## 2020-06-28 DIAGNOSIS — I5032 Chronic diastolic (congestive) heart failure: Secondary | ICD-10-CM | POA: Diagnosis not present

## 2020-06-28 DIAGNOSIS — E1169 Type 2 diabetes mellitus with other specified complication: Secondary | ICD-10-CM | POA: Diagnosis not present

## 2020-07-07 DIAGNOSIS — E1140 Type 2 diabetes mellitus with diabetic neuropathy, unspecified: Secondary | ICD-10-CM | POA: Diagnosis not present

## 2020-07-07 DIAGNOSIS — F0390 Unspecified dementia without behavioral disturbance: Secondary | ICD-10-CM | POA: Diagnosis not present

## 2020-07-07 DIAGNOSIS — I110 Hypertensive heart disease with heart failure: Secondary | ICD-10-CM | POA: Diagnosis not present

## 2020-07-07 DIAGNOSIS — G8929 Other chronic pain: Secondary | ICD-10-CM | POA: Diagnosis not present

## 2020-07-07 DIAGNOSIS — I5032 Chronic diastolic (congestive) heart failure: Secondary | ICD-10-CM | POA: Diagnosis not present

## 2020-07-07 DIAGNOSIS — M170 Bilateral primary osteoarthritis of knee: Secondary | ICD-10-CM | POA: Diagnosis not present

## 2020-07-14 DIAGNOSIS — G8929 Other chronic pain: Secondary | ICD-10-CM | POA: Diagnosis not present

## 2020-07-14 DIAGNOSIS — I5032 Chronic diastolic (congestive) heart failure: Secondary | ICD-10-CM | POA: Diagnosis not present

## 2020-07-14 DIAGNOSIS — I110 Hypertensive heart disease with heart failure: Secondary | ICD-10-CM | POA: Diagnosis not present

## 2020-07-14 DIAGNOSIS — F0390 Unspecified dementia without behavioral disturbance: Secondary | ICD-10-CM | POA: Diagnosis not present

## 2020-07-14 DIAGNOSIS — M170 Bilateral primary osteoarthritis of knee: Secondary | ICD-10-CM | POA: Diagnosis not present

## 2020-07-14 DIAGNOSIS — E1140 Type 2 diabetes mellitus with diabetic neuropathy, unspecified: Secondary | ICD-10-CM | POA: Diagnosis not present

## 2020-07-19 ENCOUNTER — Ambulatory Visit (INDEPENDENT_AMBULATORY_CARE_PROVIDER_SITE_OTHER): Payer: Medicare Other

## 2020-07-19 DIAGNOSIS — I5032 Chronic diastolic (congestive) heart failure: Secondary | ICD-10-CM | POA: Diagnosis not present

## 2020-07-19 DIAGNOSIS — Z6841 Body Mass Index (BMI) 40.0 and over, adult: Secondary | ICD-10-CM | POA: Diagnosis not present

## 2020-07-19 DIAGNOSIS — F419 Anxiety disorder, unspecified: Secondary | ICD-10-CM | POA: Diagnosis not present

## 2020-07-19 DIAGNOSIS — Z950 Presence of cardiac pacemaker: Secondary | ICD-10-CM | POA: Diagnosis not present

## 2020-07-19 DIAGNOSIS — I442 Atrioventricular block, complete: Secondary | ICD-10-CM | POA: Diagnosis not present

## 2020-07-19 DIAGNOSIS — F3341 Major depressive disorder, recurrent, in partial remission: Secondary | ICD-10-CM | POA: Diagnosis not present

## 2020-07-19 DIAGNOSIS — G8929 Other chronic pain: Secondary | ICD-10-CM | POA: Diagnosis not present

## 2020-07-19 DIAGNOSIS — E669 Obesity, unspecified: Secondary | ICD-10-CM | POA: Diagnosis not present

## 2020-07-19 DIAGNOSIS — Z86010 Personal history of colonic polyps: Secondary | ICD-10-CM | POA: Diagnosis not present

## 2020-07-19 DIAGNOSIS — Z85828 Personal history of other malignant neoplasm of skin: Secondary | ICD-10-CM | POA: Diagnosis not present

## 2020-07-19 DIAGNOSIS — I110 Hypertensive heart disease with heart failure: Secondary | ICD-10-CM | POA: Diagnosis not present

## 2020-07-19 DIAGNOSIS — Z79891 Long term (current) use of opiate analgesic: Secondary | ICD-10-CM | POA: Diagnosis not present

## 2020-07-19 DIAGNOSIS — E1140 Type 2 diabetes mellitus with diabetic neuropathy, unspecified: Secondary | ICD-10-CM | POA: Diagnosis not present

## 2020-07-19 DIAGNOSIS — Z86711 Personal history of pulmonary embolism: Secondary | ICD-10-CM | POA: Diagnosis not present

## 2020-07-19 DIAGNOSIS — I050 Rheumatic mitral stenosis: Secondary | ICD-10-CM | POA: Diagnosis not present

## 2020-07-19 DIAGNOSIS — Z853 Personal history of malignant neoplasm of breast: Secondary | ICD-10-CM | POA: Diagnosis not present

## 2020-07-19 DIAGNOSIS — M4800 Spinal stenosis, site unspecified: Secondary | ICD-10-CM | POA: Diagnosis not present

## 2020-07-19 DIAGNOSIS — F0390 Unspecified dementia without behavioral disturbance: Secondary | ICD-10-CM | POA: Diagnosis not present

## 2020-07-19 DIAGNOSIS — N200 Calculus of kidney: Secondary | ICD-10-CM | POA: Diagnosis not present

## 2020-07-19 DIAGNOSIS — Z7901 Long term (current) use of anticoagulants: Secondary | ICD-10-CM | POA: Diagnosis not present

## 2020-07-19 DIAGNOSIS — M170 Bilateral primary osteoarthritis of knee: Secondary | ICD-10-CM | POA: Diagnosis not present

## 2020-07-19 DIAGNOSIS — E7800 Pure hypercholesterolemia, unspecified: Secondary | ICD-10-CM | POA: Diagnosis not present

## 2020-07-19 DIAGNOSIS — Z8543 Personal history of malignant neoplasm of ovary: Secondary | ICD-10-CM | POA: Diagnosis not present

## 2020-07-19 DIAGNOSIS — Z952 Presence of prosthetic heart valve: Secondary | ICD-10-CM | POA: Diagnosis not present

## 2020-07-19 DIAGNOSIS — Z794 Long term (current) use of insulin: Secondary | ICD-10-CM | POA: Diagnosis not present

## 2020-07-19 DIAGNOSIS — Z86718 Personal history of other venous thrombosis and embolism: Secondary | ICD-10-CM | POA: Diagnosis not present

## 2020-07-20 LAB — CUP PACEART REMOTE DEVICE CHECK
Battery Remaining Longevity: 121 mo
Battery Remaining Percentage: 95.5 %
Battery Voltage: 2.99 V
Brady Statistic AP VP Percent: 2.2 %
Brady Statistic AP VS Percent: 1 %
Brady Statistic AS VP Percent: 97 %
Brady Statistic AS VS Percent: 1 %
Brady Statistic RA Percent Paced: 1.7 %
Brady Statistic RV Percent Paced: 99 %
Date Time Interrogation Session: 20220516020025
Implantable Lead Implant Date: 20180216
Implantable Lead Implant Date: 20180216
Implantable Lead Location: 753859
Implantable Lead Location: 753860
Implantable Pulse Generator Implant Date: 20180216
Lead Channel Impedance Value: 410 Ohm
Lead Channel Impedance Value: 410 Ohm
Lead Channel Pacing Threshold Amplitude: 0.5 V
Lead Channel Pacing Threshold Amplitude: 0.625 V
Lead Channel Pacing Threshold Pulse Width: 0.5 ms
Lead Channel Pacing Threshold Pulse Width: 0.5 ms
Lead Channel Sensing Intrinsic Amplitude: 12 mV
Lead Channel Sensing Intrinsic Amplitude: 2 mV
Lead Channel Setting Pacing Amplitude: 0.875
Lead Channel Setting Pacing Amplitude: 2 V
Lead Channel Setting Pacing Pulse Width: 0.5 ms
Lead Channel Setting Sensing Sensitivity: 2.5 mV
Pulse Gen Model: 2272
Pulse Gen Serial Number: 7998131

## 2020-07-21 DIAGNOSIS — N183 Chronic kidney disease, stage 3 unspecified: Secondary | ICD-10-CM | POA: Diagnosis not present

## 2020-07-21 DIAGNOSIS — E7800 Pure hypercholesterolemia, unspecified: Secondary | ICD-10-CM | POA: Diagnosis not present

## 2020-07-21 DIAGNOSIS — M170 Bilateral primary osteoarthritis of knee: Secondary | ICD-10-CM | POA: Diagnosis not present

## 2020-07-21 DIAGNOSIS — F339 Major depressive disorder, recurrent, unspecified: Secondary | ICD-10-CM | POA: Diagnosis not present

## 2020-07-21 DIAGNOSIS — Z853 Personal history of malignant neoplasm of breast: Secondary | ICD-10-CM | POA: Diagnosis not present

## 2020-07-21 DIAGNOSIS — I10 Essential (primary) hypertension: Secondary | ICD-10-CM | POA: Diagnosis not present

## 2020-07-21 DIAGNOSIS — G8929 Other chronic pain: Secondary | ICD-10-CM | POA: Diagnosis not present

## 2020-07-21 DIAGNOSIS — E1169 Type 2 diabetes mellitus with other specified complication: Secondary | ICD-10-CM | POA: Diagnosis not present

## 2020-07-21 DIAGNOSIS — F0390 Unspecified dementia without behavioral disturbance: Secondary | ICD-10-CM | POA: Diagnosis not present

## 2020-07-21 DIAGNOSIS — I110 Hypertensive heart disease with heart failure: Secondary | ICD-10-CM | POA: Diagnosis not present

## 2020-07-21 DIAGNOSIS — E1140 Type 2 diabetes mellitus with diabetic neuropathy, unspecified: Secondary | ICD-10-CM | POA: Diagnosis not present

## 2020-07-21 DIAGNOSIS — I5033 Acute on chronic diastolic (congestive) heart failure: Secondary | ICD-10-CM | POA: Diagnosis not present

## 2020-07-21 DIAGNOSIS — F3341 Major depressive disorder, recurrent, in partial remission: Secondary | ICD-10-CM | POA: Diagnosis not present

## 2020-07-21 DIAGNOSIS — I5032 Chronic diastolic (congestive) heart failure: Secondary | ICD-10-CM | POA: Diagnosis not present

## 2020-07-26 ENCOUNTER — Ambulatory Visit: Payer: Medicare Other | Admitting: Cardiovascular Disease

## 2020-08-09 DIAGNOSIS — E1140 Type 2 diabetes mellitus with diabetic neuropathy, unspecified: Secondary | ICD-10-CM | POA: Diagnosis not present

## 2020-08-09 DIAGNOSIS — F0390 Unspecified dementia without behavioral disturbance: Secondary | ICD-10-CM | POA: Diagnosis not present

## 2020-08-09 DIAGNOSIS — G8929 Other chronic pain: Secondary | ICD-10-CM | POA: Diagnosis not present

## 2020-08-09 DIAGNOSIS — I5032 Chronic diastolic (congestive) heart failure: Secondary | ICD-10-CM | POA: Diagnosis not present

## 2020-08-09 DIAGNOSIS — M170 Bilateral primary osteoarthritis of knee: Secondary | ICD-10-CM | POA: Diagnosis not present

## 2020-08-09 DIAGNOSIS — I110 Hypertensive heart disease with heart failure: Secondary | ICD-10-CM | POA: Diagnosis not present

## 2020-08-11 NOTE — Progress Notes (Signed)
Remote pacemaker transmission.   

## 2020-09-16 ENCOUNTER — Ambulatory Visit (HOSPITAL_BASED_OUTPATIENT_CLINIC_OR_DEPARTMENT_OTHER): Payer: Medicare Other | Admitting: Cardiovascular Disease

## 2020-09-19 IMAGING — CT CT ANGIO CHEST
2 of 6 series · 17 of 36 positions shown · IV contrast (omnipaque)
Comparison: Chest radiograph July 18, 2019; CT angiogram chest June 20, 2017; CT angiogram chest April 12, 2016

CLINICAL DATA: Shortness of breath

EXAM:
CT ANGIOGRAPHY CHEST WITH CONTRAST
TECHNIQUE: Multidetector CT imaging of the chest was performed using the
standard protocol during bolus administration of intravenous
contrast. Multiplanar CT image reconstructions and MIPs were
obtained to evaluate the vascular anatomy.
CONTRAST:  100mL OMNIPAQUE IOHEXOL 350 MG/ML SOLN

[Series 5: thins · axial · 0.74mm/px · z∈[-310,-56]mm · 16 of 286 slices shown]
[im 16/286  lung]
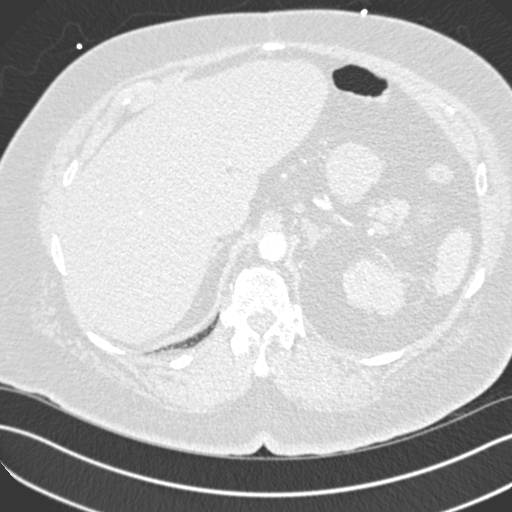
[im 32/286  mediastinal]
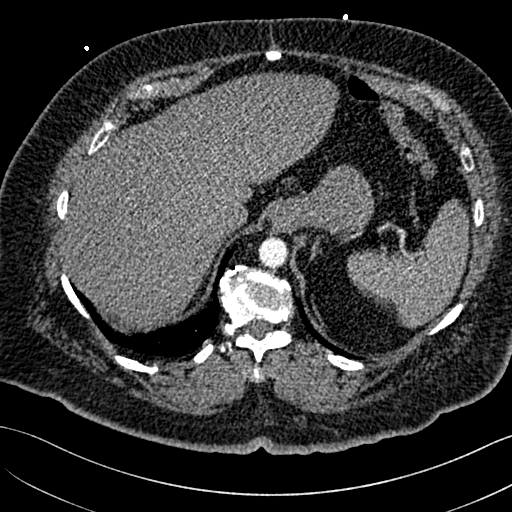
[im 48/286  lung]
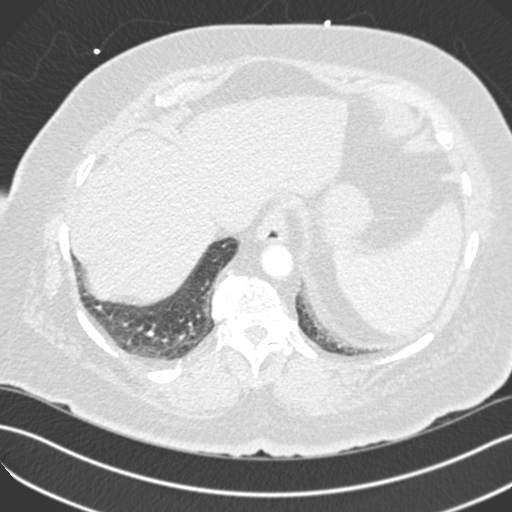
[im 64/286  mediastinal]
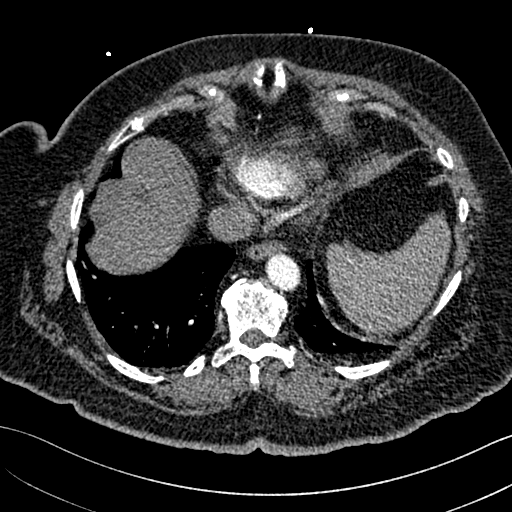
[im 80/286  lung]
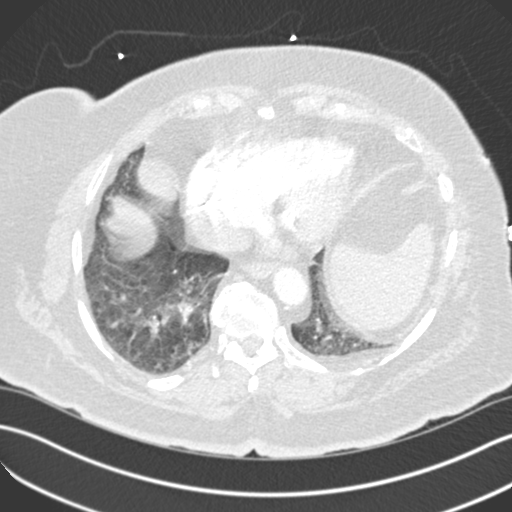
[im 96/286  mediastinal]
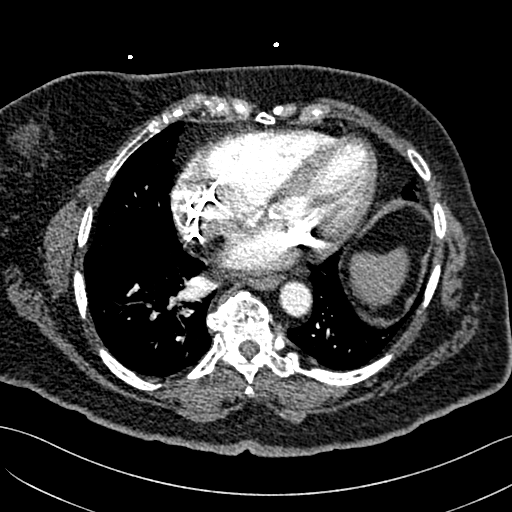
[im 111/286  lung]
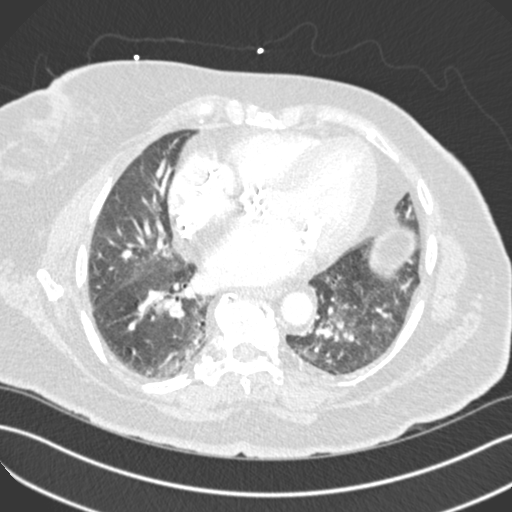
[im 127/286  mediastinal]
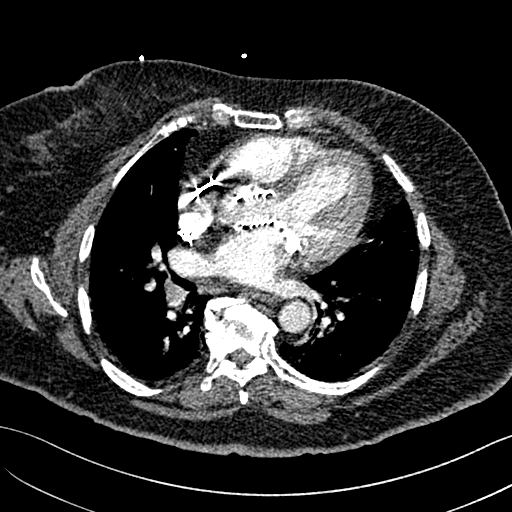
[im 159/286  lung]
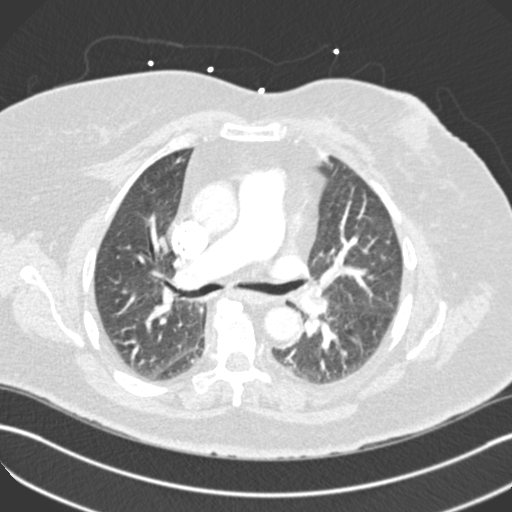
[im 175/286  mediastinal]
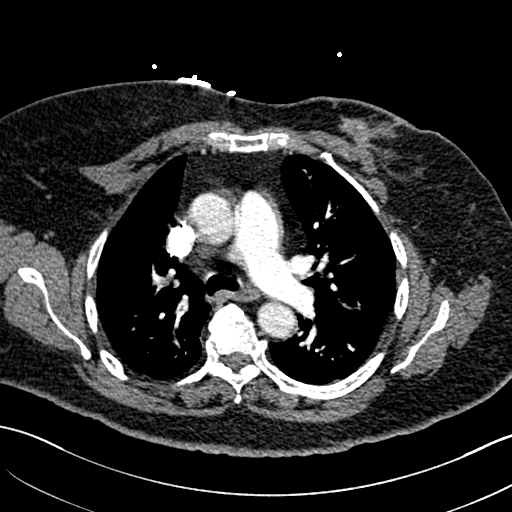
[im 191/286  lung]
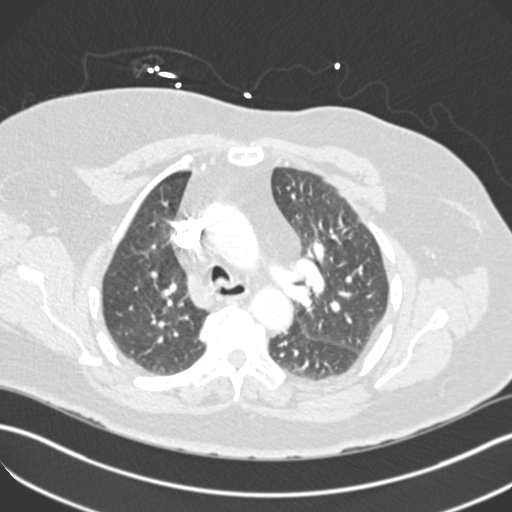
[im 206/286  mediastinal]
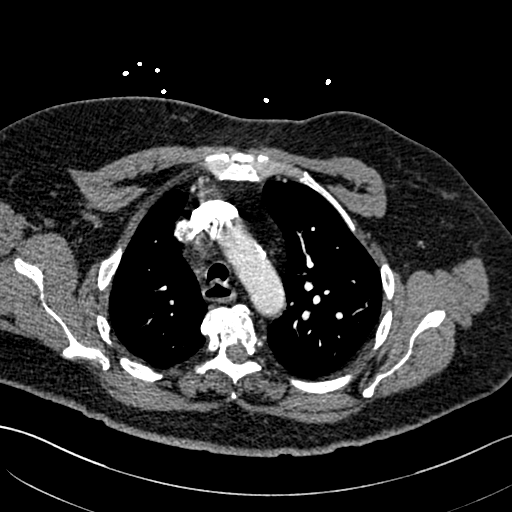
[im 222/286  lung]
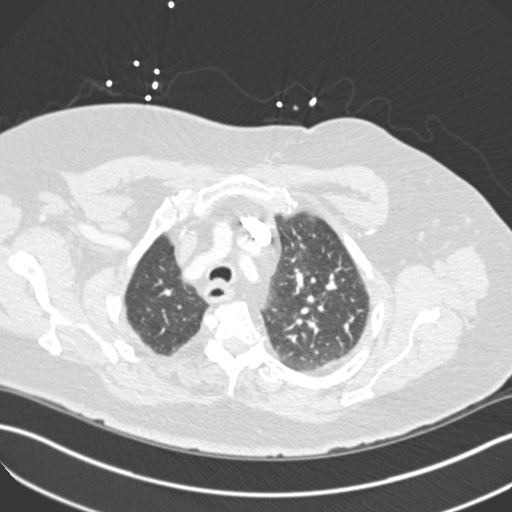
[im 238/286  mediastinal]
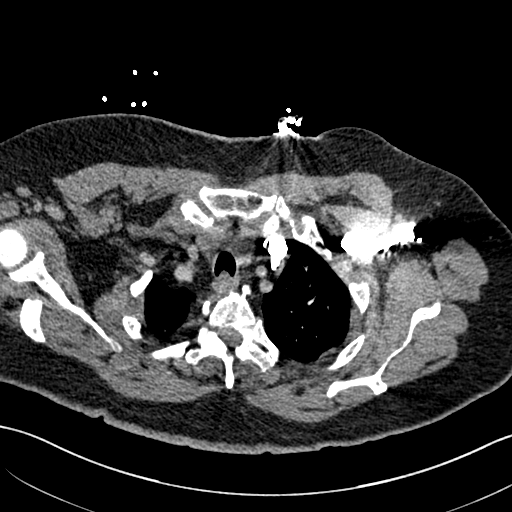
[im 254/286  lung]
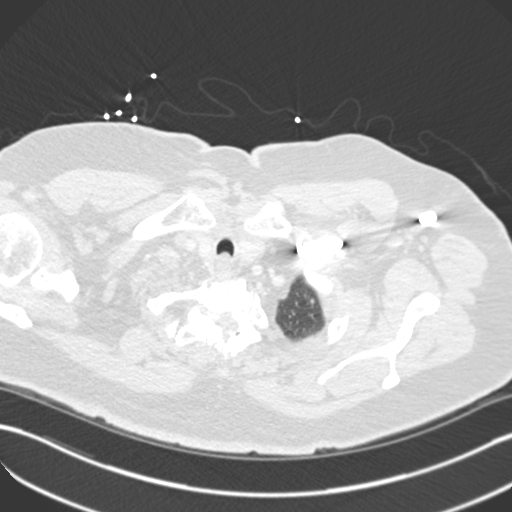
[im 270/286  mediastinal]
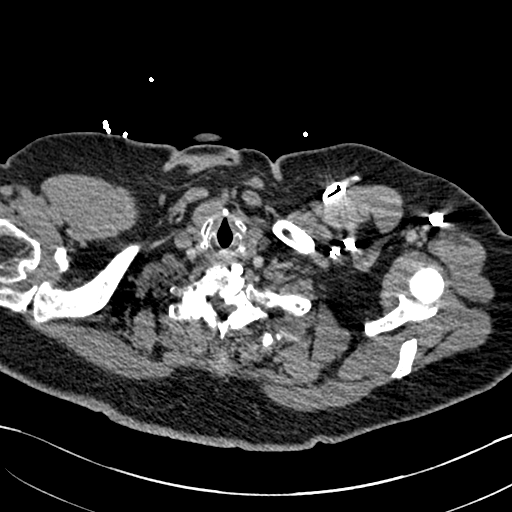

[Series 7: coronal mpr · coronal · 0.57mm/px · 1 of 172 slices shown]
[im 86/172  mediastinal]
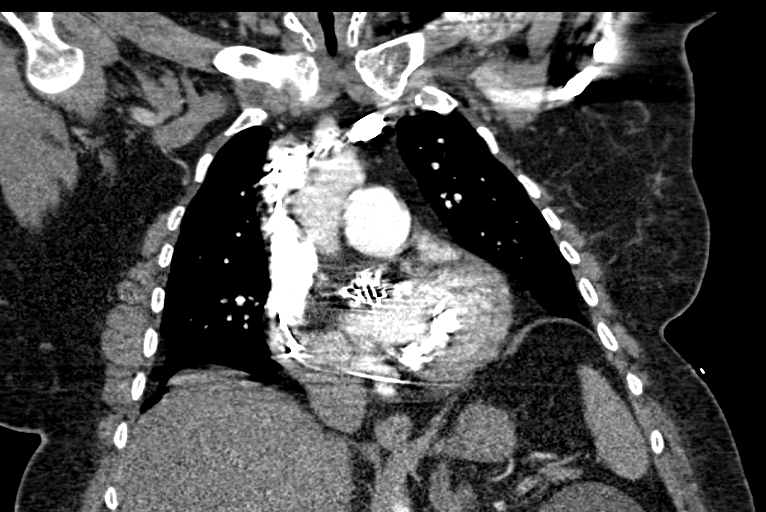

[17 of 36 positions shown; findings below may reference images not displayed]

FINDINGS: Cardiovascular: There are multiple pulmonary emboli throughout both
lower lobes as well as in the right upper lobe. Pulmonary embolus is
seen in the distal most aspect of the left main pulmonary artery.
The right ventricle to left ventricle diameter ratio is 1.4,
indicative of right heart strain. There is no thoracic aortic
aneurysm or dissection. There are scattered foci of calcification in
visualized great vessels. There is aortic atherosclerosis. Pacemaker
leads are attached to the right atrium and right ventricle. There is
extensive mitral annulus calcification. There is a prosthetic aortic
valve. No pericardial effusion or pericardial thickening evident.

Mediastinum/Nodes: There is a stable mass in the left thyroid
containing calcification measuring 2.1 x 2.0 cm. A calcification
along the inferior aspect of the left lobe of the thyroid measures
1.2 x 1.1 cm, stable. No new thyroid lesion evident. No evident
thoracic adenopathy. No esophageal lesions are evident.

Lungs/Pleura: There is mild bibasilar atelectasis. There is no
appreciable edema or airspace opacity. No pleural effusions are
evident.

Upper Abdomen: There is hepatic steatosis. There is a cyst arising
from the left kidney, incompletely visualized, measuring 6.5 x
cm. Visualized upper abdominal structures otherwise are
unremarkable.

Musculoskeletal: There is degenerative change in the thoracic spine.
No blastic or lytic bone lesions. No chest wall lesions.

Review of the MIP images confirms the above findings.
IMPRESSION: 1. Extensive pulmonary embolus, more severe on the right than on the
left, with right heart strain. Positive for acute PE with CTevidence
of right heart strain (RV/LV Ratio = 1.4) consistent with at least
submassive (intermediate risk) PE. The presence of right heart
strain has been associated with an increased risk of morbidity and
mortality.

2. Aortic atherosclerosis. No aneurysm or dissection evident. Status
post aortic valve replacement. Pacemaker leads attached to right
atrium and right ventricle.

3.  Bibasilar atelectasis.  No airspace consolidation.

4.  No adenopathy.

5. Stable left lobe thyroid nodule. In the setting of significant
comorbidities or limited life expectancy, no follow-up recommended.
Clinical assessment with respect to potential further evaluation of
this nodule advised. (Ref: [HOSPITAL]. [DATE]):

6.  Hepatic steatosis.

Aortic Atherosclerosis (XOKRC-EL3.3).

Critical Value/emergent results were called by telephone at the time
of interpretation on 07/18/2019 at [DATE] to provider TONTONHO SENNA
, who verbally acknowledged these results.

## 2020-10-15 ENCOUNTER — Encounter: Payer: Self-pay | Admitting: Podiatry

## 2020-10-15 ENCOUNTER — Other Ambulatory Visit: Payer: Self-pay

## 2020-10-15 ENCOUNTER — Ambulatory Visit (INDEPENDENT_AMBULATORY_CARE_PROVIDER_SITE_OTHER): Payer: Medicare Other | Admitting: Podiatry

## 2020-10-15 DIAGNOSIS — I890 Lymphedema, not elsewhere classified: Secondary | ICD-10-CM

## 2020-10-15 DIAGNOSIS — B351 Tinea unguium: Secondary | ICD-10-CM

## 2020-10-15 DIAGNOSIS — M79675 Pain in left toe(s): Secondary | ICD-10-CM | POA: Diagnosis not present

## 2020-10-15 DIAGNOSIS — E1142 Type 2 diabetes mellitus with diabetic polyneuropathy: Secondary | ICD-10-CM

## 2020-10-15 DIAGNOSIS — B353 Tinea pedis: Secondary | ICD-10-CM

## 2020-10-15 DIAGNOSIS — E119 Type 2 diabetes mellitus without complications: Secondary | ICD-10-CM | POA: Diagnosis not present

## 2020-10-15 DIAGNOSIS — M79674 Pain in right toe(s): Secondary | ICD-10-CM | POA: Diagnosis not present

## 2020-10-15 MED ORDER — CLOTRIMAZOLE 1 % EX CREA: TOPICAL_CREAM | CUTANEOUS | 2 refills | Status: DC

## 2020-10-15 NOTE — Patient Instructions (Addendum)
Diabetes Mellitus and Foot Care Foot care is an important part of your health, especially when you have diabetes. Diabetes may cause you to have problems because of poor blood flow (circulation) to your feet and legs, which can cause your skin to: Become thinner and drier. Break more easily. Heal more slowly. Peel and crack. You may also have nerve damage (neuropathy) in your legs and feet, causing decreased feeling in them. This means that you may not notice minor injuries to your feet that could lead to more serious problems. Noticing and addressing any potential problems early is the best wayto prevent future foot problems. How to care for your feet Foot hygiene  Wash your feet daily with warm water and mild soap. Do not use hot water. Then, pat your feet and the areas between your toes until they are completely dry. Do not soak your feet as this can dry your skin. Trim your toenails straight across. Do not dig under them or around the cuticle. File the edges of your nails with an emery board or nail file. Apply a moisturizing lotion or petroleum jelly to the skin on your feet and to dry, brittle toenails. Use lotion that does not contain alcohol and is unscented. Do not apply lotion between your toes.  Shoes and socks Wear clean socks or stockings every day. Make sure they are not too tight. Do not wear knee-high stockings since they may decrease blood flow to your legs. Wear shoes that fit properly and have enough cushioning. Always look in your shoes before you put them on to be sure there are no objects inside. To break in new shoes, wear them for just a few hours a day. This prevents injuries on your feet. Wounds, scrapes, corns, and calluses  Check your feet daily for blisters, cuts, bruises, sores, and redness. If you cannot see the bottom of your feet, use a mirror or ask someone for help. Do not cut corns or calluses or try to remove them with medicine. If you find a minor scrape,  cut, or break in the skin on your feet, keep it and the skin around it clean and dry. You may clean these areas with mild soap and water. Do not clean the area with peroxide, alcohol, or iodine. If you have a wound, scrape, corn, or callus on your foot, look at it several times a day to make sure it is healing and not infected. Check for: Redness, swelling, or pain. Fluid or blood. Warmth. Pus or a bad smell.  General tips Do not cross your legs. This may decrease blood flow to your feet. Do not use heating pads or hot water bottles on your feet. They may burn your skin. If you have lost feeling in your feet or legs, you may not know this is happening until it is too late. Protect your feet from hot and cold by wearing shoes, such as at the beach or on hot pavement. Schedule a complete foot exam at least once a year (annually) or more often if you have foot problems. Report any cuts, sores, or bruises to your health care provider immediately. Where to find more information American Diabetes Association: www.diabetes.org Association of Diabetes Care & Education Specialists: www.diabeteseducator.org Contact a health care provider if: You have a medical condition that increases your risk of infection and you have any cuts, sores, or bruises on your feet. You have an injury that is not healing. You have redness on your legs or feet.  You feel burning or tingling in your legs or feet. You have pain or cramps in your legs and feet. Your legs or feet are numb. Your feet always feel cold. You have pain around any toenails. Get help right away if: You have a wound, scrape, corn, or callus on your foot and: You have pain, swelling, or redness that gets worse. You have fluid or blood coming from the wound, scrape, corn, or callus. Your wound, scrape, corn, or callus feels warm to the touch. You have pus or a bad smell coming from the wound, scrape, corn, or callus. You have a fever. You have a red  line going up your leg. Summary Check your feet every day for blisters, cuts, bruises, sores, and redness. Apply a moisturizing lotion or petroleum jelly to the skin on your feet and to dry, brittle toenails. Wear shoes that fit properly and have enough cushioning. If you have foot problems, report any cuts, sores, or bruises to your health care provider immediately. Schedule a complete foot exam at least once a year (annually) or more often if you have foot problems. This information is not intended to replace advice given to you by your health care provider. Make sure you discuss any questions you have with your healthcare provider. Document Revised: 09/11/2019 Document Reviewed: 09/11/2019 Elsevier Patient Education  [place] Athlete's foot (tinea pedis) is a fungal infection of the skin on your feet. It often occurs on the skin that is between or underneath the toes. It can also occur on the soles of your feet. The infection can spread from person to person (is contagious). It can also spread when a person's bare feet come in contact with the funguson shower floors or on items such as shoes. What are the causes? This condition is caused by a fungus that grows in warm, moist places. You can get athlete's foot by sharing shoes, shower stalls, towels, and wet floors with someone who is infected. Not washing your feet or changing your socks oftenenough can also lead to athlete's foot. What increases the risk? This condition is more likely to develop in: Men. People who have a weak body defense system (immune system). People who have diabetes. People who use public showers, such as at a gym. People who wear heavy-duty shoes, such as Environmental manager. Seasons with warm, humid weather. What are the signs or symptoms? Symptoms of this condition include: Itchy areas between your toes or on the soles of your feet. White, flaky, or scaly areas between your  toes or on the soles of your feet. Very itchy small blisters between your toes or on the soles of your feet. Small cuts in your skin. These cuts can become infected. Thick or discolored toenails. How is this diagnosed? This condition may be diagnosed with a physical exam and a review of your medical history. Your health care provider may also take a skin or toenailsample to examine under a microscope. How is this treated? This condition is treated with antifungal medicines. These may be applied as powders, ointments, or creams. In severe cases, an oral antifungal medicine maybe given. Follow these instructions at home: Medicines Apply or take over-the-counter and prescription medicines only as told by your health care provider. Apply your antifungal medicine as told by your health care provider. Do not stop using the antifungal even if your condition improves. Foot care Do not scratch your feet. Keep your feet dry: Wear cotton or wool  socks. Change your socks every day or if they become wet. Wear shoes that allow air to flow, such as sandals or canvas tennis shoes. Wash and dry your feet, including the area between your toes. Also, wash and dry your feet: Every day or as told by your health care provider. After exercising. General instructions Do not let others use towels, shoes, nail clippers, or other personal items that touch your feet. Protect your feet by wearing sandals in wet areas, such as locker rooms and shared showers. Keep all follow-up visits as told by your health care provider. This is important. If you have diabetes, keep your blood sugar under control. Contact a health care provider if: You have a fever. You have swelling, soreness, warmth, or redness in your foot. Your feet are not getting better with treatment. Your symptoms get worse. You have new symptoms. Summary Athlete's foot (tinea pedis) is a fungal infection of the skin on your feet. It often occurs on skin  that is between or underneath the toes. This condition is caused by a fungus that grows in warm, moist places. Symptoms include white, flaky, or scaly areas between your toes or on the soles of your feet. This condition is treated with antifungal medicines. Keep your feet clean. Always dry them thoroughly. This information is not intended to replace advice given to you by your health care provider. Make sure you discuss any questions you have with your healthcare provider. Document Revised: 10/09/2019 Document Reviewed: 10/09/2019 Elsevier Patient Education  2022 [employer].

## 2020-10-18 ENCOUNTER — Ambulatory Visit (INDEPENDENT_AMBULATORY_CARE_PROVIDER_SITE_OTHER): Payer: Medicare Other

## 2020-10-18 DIAGNOSIS — I442 Atrioventricular block, complete: Secondary | ICD-10-CM | POA: Diagnosis not present

## 2020-10-18 NOTE — Progress Notes (Signed)
Subjective: [name] presents today referred by Aretta Nip, MD for diabetic foot evaluation.  Patient relates 15 year history of diabetes.  Patient denies any history of foot wounds.  Patient does endorse symptoms of numbness, tingling and burning in feet.  Patient relates blood glucose was 149 mg/dl this morning. Last A1c was 6.7%.  PCP is Rankins, Bill Salinas, MD , and last visit was April, 2022.  Today, patient c/o of painful, discolored, thick toenails which interfere with daily activities.  Pain is aggravated when wearing enclosed shoe gear.   She has h/o lymphedema and uses compression hose with the aid of a stocking butler aid.  She resides in [place] with her sister. They are both patients here.They assist each other with ADLs. Ms. Urciuoli also has the support of her sister's daughter, Howie Ill.  Past Medical History:  Diagnosis Date   Anxiety    Aortic stenosis    Arthritis    Breast cancer ([place])    CANCER   Diabetes mellitus    Type II   Dyspnea    Heart murmur    History of chemotherapy    History of kidney stones    Hypertension    Mitral stenosis    Morbid obesity (HCC)    BMI > 40   Ovarian cancer (HCC)    Presence of permanent cardiac pacemaker    St. 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   Ovarian cancer ([place]) 05/23/2018   Diabetes mellitus ([place]) 05/23/2018   Perforated duodenal ulcer ([place]) 03/23/2018    Severe aortic stenosis 07/17/2017   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   Increased risk of breast cancer 11/07/2016   Mitral stenosis 10/02/2016   Symptomatic bradycardia 04/21/2016   Bradycardia 04/20/2016   Morbid obesity ([place]) 04/20/2016   Essential hypertension 04/20/2016   Insulin dependent diabetes mellitus 04/20/2016   History of ovarian cancer 04/20/2016   Aortic stenosis 04/20/2016   Complete heart block ([place]) 04/20/2016   Spinal stenosis 05/15/2013   Anxiety 05/15/2013   Colon polyps 05/15/2013   History of breast cancer 12/27/2010    Past Surgical History:  Procedure Laterality Date   ABDOMINAL HYSTERECTOMY  2013   BREAST SURGERY  03-2004   lumpectomy    CARDIAC CATHETERIZATION     INSERT / REPLACE / REMOVE PACEMAKER     INTRAOPERATIVE TRANSTHORACIC ECHOCARDIOGRAM  07/17/2017   Procedure: INTRAOPERATIVE TRANSTHORACIC ECHOCARDIOGRAM;  Surgeon: Sherren Mocha, MD;  Location: [facility];  Service: Open Heart Surgery;;   LAPAROTOMY N/A 03/22/2018   Procedure: Jenetta Downer pouch, repair of bowel perforation;  Surgeon: Mickeal Skinner, MD;  Location: [facility];  Service: General;  Laterality: N/A;   LITHOTRIPSY     PACEMAKER IMPLANT N/A 04/21/2016   Procedure: Pacemaker Implant;  Surgeon: Will Meredith Leeds, MD;  Location: [facility];  Service: Cardiovascular;  Laterality: N/A;   TONSILLECTOMY     TRANSCATHETER AORTIC VALVE REPLACEMENT,  TRANSFEMORAL N/A 07/17/2017   Procedure: TRANSCATHETER AORTIC VALVE REPLACEMENT, TRANSFEMORAL;  Surgeon: Sherren Mocha, MD;  Location: [facility];  Service: Open Heart Surgery;  Laterality: N/A;    Current Outpatient Medications on File Prior to Visit  Medication Sig Dispense Refill   apixaban (ELIQUIS) 5 MG TABS tablet Take by mouth.     pantoprazole (PROTONIX) 40 MG tablet Take by mouth.     apixaban (ELIQUIS) 2.5 MG TABS tablet Take 1 tablet (2.5 mg total) by mouth 2 (two) times daily. 60 tablet 11   atorvastatin  (LIPITOR) 20 MG tablet Take 20 mg by mouth at bedtime.      Calcium Citrate-Vitamin D (CALCIUM CITRATE + PO) Take 1 tablet by mouth 2 (two) times daily.      cholecalciferol (VITAMIN D) 1000 units tablet Take 1,000 Units by mouth 2 (two) times daily.      ferrous sulfate 325 (65 FE) MG tablet 1 tablet     fish oil-omega-3 fatty acids 1000 MG capsule Take 1 g by mouth every evening.      FLUoxetine (PROZAC) 40 MG capsule Take 40 mg by mouth daily.      furosemide (LASIX) 40 MG tablet Take 40 mg (1 tab) by mouth twice daily every Mon., Wed, Fri., and Sun. Take 40 mg once daily on Tues., Thurs., and Sat. 132 tablet 3   LEVEMIR FLEXTOUCH 100 UNIT/ML Pen Inject 60-70 Units into the skin 2 (two) times daily. Per sliding scale     Magnesium 400 MG CAPS Take 400 mg by mouth at bedtime.      metFORMIN (GLUCOPHAGE) 1000 MG tablet Take 1,000 mg by mouth 2 (two) times daily with a meal.      Multiple Vitamin (MULTIVITAMIN) tablet Take 1 tablet by mouth daily.       olmesartan (BENICAR) 5 MG tablet Take by mouth.     pantoprazole (PROTONIX) 40 MG tablet Take 1 tablet (40 mg total) by mouth 2 (two) times daily. 30 tablet 0   saccharomyces boulardii (FLORASTOR) 250 MG capsule Take 1 capsule (250 mg total) by mouth 2 (two) times daily. You can find a probiotic over the counter. Take this while you are on antibiotics.     SHINGRIX injection      No current facility-administered medications on file prior to visit.     Allergies  Allergen Reactions   Nsaids Anaphylaxis and Other (See Comments)   Tape Rash    CLEAR PLASTIC TAPE    Social History   Occupational History   Not on file  Tobacco Use   Smoking status: Former    Packs/day: 1.00    Years: 5.00    Pack years: 5.00    Types: Cigarettes   Smokeless tobacco: Never  Vaping Use   Vaping Use: Never used  Substance and Sexual Activity   Alcohol use: No   Drug use: No   Sexual activity: Not on file    Family History  Problem Relation Age of  Onset   Bladder Cancer Father    Cancer Brother    Cancer Sister    Heart failure Mother     Immunization History  Administered Date(s) Administered   PFIZER(Purple Top)SARS-COV-2 Vaccination 04/10/2019, 05/05/2019    Objective: There were no vitals filed for this visit.  Jakyah Watson is a pleasant 85 y.o. female morbidly obese in NAD. AAO X 3.  Vascular Examination: Capillary fill time to digits <3 seconds b/l lower extremities. Palpable DP pulse(s) b/l lower  extremities Nonpalpable PT pulse(s) b/l lower extremities. Pedal hair absent. Lower extremity skin temperature gradient within normal limits. No pain with calf compression b/l. Lymphedema present b/l lower extremities. No ischemia or gangrene noted b/l lower extremities.  Dermatological Examination: No open wounds b/l lower extremities. No interdigital macerations b/l lower extremities. Toenails 1-5 b/l elongated, discolored, dystrophic, thickened, crumbly with subungual debris and tenderness to dorsal palpation. Evidence of fungal plaques noted along dorsal aspect of digits b/l. Skin b/l lower extremities noted to be thickened and brawny consistent with lymphedema.  Musculoskeletal Examination: Normal muscle strength 5/5 to all lower extremity muscle groups bilaterally. No pain crepitus or joint limitation noted with ROM b/l lower extremities. Utilizes wheelchair for mobility assistance.  Footwear Assessment: Does the patient wear appropriate shoes? Yes. Does the patient need inserts/orthotics? No.  Neurological Examination: Protective sensation decreased with 10 gram monofilament b/l. Vibratory sensation diminished b/l.  Lab: No flowsheet data found.   Assessment: 1. Pain due to onychomycosis of toenails of both feet   2. Tinea pedis of both feet   3. Lymphedema of both lower extremities   4. Diabetic peripheral neuropathy associated with type 2 diabetes mellitus ([place])   5. Encounter for diabetic foot exam ([place])       ADA Risk Categorization:  High Risk:  Patient has one or more of the following: Loss of protective sensation Absent pedal pulses Severe Foot deformity History of foot ulcer  Plan: -Examined patient. -Diabetic foot examination performed today. -Patient to continue soft, supportive shoe gear daily. -Discussed topical, laser and oral medication for onychomycosis. Patient opted for toenail debridement only on today.  -Toenails 1-5 b/l were debrided in length and girth with sterile nail nippers and dremel without iatrogenic bleeding.  -Patient to report any pedal injuries to medical professional immediately. -For tinea pedis, Rx sent to pharmacy for Clotrimazole Cream 1% to be applied to dorsal aspect of toes once daily until resolved. -Patient/POA to call should there be question/concern in the interim.  Return in about 3 months (around 01/15/2021).  Marzetta Board, DPM

## 2020-10-20 ENCOUNTER — Telehealth: Payer: Self-pay

## 2020-10-20 LAB — CUP PACEART REMOTE DEVICE CHECK
Battery Remaining Longevity: 68 mo
Battery Remaining Percentage: 58 %
Battery Voltage: 2.99 V
Brady Statistic AP VP Percent: 2.1 %
Brady Statistic AP VS Percent: 1 %
Brady Statistic AS VP Percent: 97 %
Brady Statistic AS VS Percent: 1 %
Brady Statistic RA Percent Paced: 1.6 %
Brady Statistic RV Percent Paced: 99 %
Date Time Interrogation Session: 20220815020027
Implantable Lead Implant Date: 20180216
Implantable Lead Implant Date: 20180216
Implantable Lead Location: 753859
Implantable Lead Location: 753860
Implantable Pulse Generator Implant Date: 20180216
Lead Channel Impedance Value: 390 Ohm
Lead Channel Impedance Value: 400 Ohm
Lead Channel Pacing Threshold Amplitude: 0.5 V
Lead Channel Pacing Threshold Amplitude: 0.75 V
Lead Channel Pacing Threshold Pulse Width: 0.5 ms
Lead Channel Pacing Threshold Pulse Width: 0.5 ms
Lead Channel Sensing Intrinsic Amplitude: 12 mV
Lead Channel Sensing Intrinsic Amplitude: 2.3 mV
Lead Channel Setting Pacing Amplitude: 1 V
Lead Channel Setting Pacing Amplitude: 2 V
Lead Channel Setting Pacing Pulse Width: 0.5 ms
Lead Channel Setting Sensing Sensitivity: 2.5 mV
Pulse Gen Model: 2272
Pulse Gen Serial Number: 7998131

## 2020-10-20 NOTE — Telephone Encounter (Signed)
"  Scheduled remote reviewed. 213 new AMS events < 5 minutes.  Known PAF. + OAC. Normal device function.  22-AT. Longest 18 H 6 sec, 10/11/20. Routing to triage for long AT"  Transmission reviewed. Presenting rhythm AS/VP. Successful telephone encounter to patient to see if she had s/s of AT on given date of episode. Per patient, she had a lot of anxiety on this day as her sister (who has dementia) had a new caregiver in the home on this day. Patient states she was anxious about how her sister and this caregiver would interact. States visit was lengthy and she had to answer a lot of questions. Denies s/s of AT such as palpitations, chest discomfort, or increased shortness of breath. Compliant with medications including eliquis and lasix. Patient provided ED precautions. Patient appreciative of call.

## 2020-10-28 ENCOUNTER — Other Ambulatory Visit: Payer: Self-pay

## 2020-10-28 ENCOUNTER — Ambulatory Visit (INDEPENDENT_AMBULATORY_CARE_PROVIDER_SITE_OTHER): Payer: Medicare Other | Admitting: Cardiology

## 2020-10-28 ENCOUNTER — Encounter: Payer: Self-pay | Admitting: Cardiology

## 2020-10-28 VITALS — BP 140/72 | HR 76 | Ht 67.0 in | Wt 265.2 lb

## 2020-10-28 DIAGNOSIS — I350 Nonrheumatic aortic (valve) stenosis: Secondary | ICD-10-CM

## 2020-10-28 DIAGNOSIS — I442 Atrioventricular block, complete: Secondary | ICD-10-CM

## 2020-10-28 MED ORDER — APIXABAN 5 MG PO TABS: 5.0000 mg | ORAL_TABLET | Freq: Two times a day (BID) | ORAL | 3 refills | Status: AC

## 2020-10-28 NOTE — Patient Instructions (Signed)
Medication Instructions:INCREASEELIQUIS TO 5 MG TWICE DAILY  *If you need a refill on your cardiac medications before your next appointment, please call your pharmacy*   Lab Work: NONE If you have labs (blood work) drawn today and your tests are completely normal, you will receive your results only by: [place] (if you have MyChart) OR A paper copy in the mail If you have any lab test that is abnormal or we need to change your treatment, we will call you to review the results.   Testing/Procedures: NONE   Follow-Up: At [facility], you and your health needs are our priority.  As part of our continuing mission to provide you with exceptional heart care, we have created designated Provider Care Teams.  These Care Teams include your primary Cardiologist (physician) and Advanced Practice Providers (APPs -  Physician Assistants and Nurse Practitioners) who all work together to provide you with the care you need, when you need it.  We recommend signing up for the patient portal called "MyChart".  Sign up information is provided on this After Visit Summary.  MyChart is used to connect with patients for Virtual Visits (Telemedicine).  Patients are able to view lab/test results, encounter notes, upcoming appointments, etc.  Non-urgent messages can be sent to your provider as well.   To learn more about what you can do with MyChart, go to [email or web address].    Your next appointment:   1 year(s)  The format for your next appointment:   In Person  Provider:  DR Curt Bears

## 2020-10-28 NOTE — Progress Notes (Signed)
Electrophysiology Office Note   Date:  10/28/2020   ID:  [name], DOB 19-May-1935, MRN [id number]  PCP:  Aretta Nip, MD  Primary Electrophysiologist:  Foday Cone Meredith Leeds, MD    No chief complaint on file.    History of Present Illness: [name] is a 85 y.o. female who is being seen today for the evaluation of heart block at the request of Rankins, Bill Salinas, MD. Presenting today for electrophysiology evaluation.   She has a history significant for diabetes, hypertension, ovarian and breast cancer status postlumpectomy, obesity, arthritis, and spinal stenosis.  She presented to [facility] 04/20/2016 with worsening shortness of breath and lower extremity edema as well as exercise intolerance.  She was found to be in complete heart block.  She is status post Saint Jude dual-chamber pacemaker implanted 04/21/2016.  Today, denies symptoms of palpitations, chest pain, shortness of breath, orthopnea, PND, lower extremity edema, claudication, dizziness, presyncope, syncope, bleeding, or neurologic sequela. The patient is tolerating medications without difficulties.  Since being seen she has done well.  She has had no chest pain or shortness of breath.  She is able to do all of her daily activities without restriction.  Past Medical History:  Diagnosis Date   Anxiety    Aortic stenosis    Arthritis    Breast cancer ([place])    CANCER   Diabetes mellitus    Type II   Dyspnea    Heart murmur    History of chemotherapy    History of kidney stones    Hypertension    Mitral stenosis    Morbid obesity (HCC)    BMI > 40   Ovarian cancer ([place])    Presence of permanent cardiac pacemaker    St. 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   26 mm Edwards Sapien 3 transcatheter heart valve placed via percutaneous right transfemoral approach    Spinal stenosis    Past Surgical History:  Procedure  Laterality Date   ABDOMINAL HYSTERECTOMY  2013   BREAST SURGERY  03-2004   lumpectomy    CARDIAC CATHETERIZATION     INSERT / REPLACE / REMOVE PACEMAKER     INTRAOPERATIVE TRANSTHORACIC ECHOCARDIOGRAM  07/17/2017   Procedure: INTRAOPERATIVE TRANSTHORACIC ECHOCARDIOGRAM;  Surgeon: Sherren Mocha, MD;  Location: [place];  Service: Open Heart Surgery;;   LAPAROTOMY N/A 03/22/2018   Procedure: Jenetta Downer pouch, repair of bowel perforation;  Surgeon: Mickeal Skinner, MD;  Location: [place];  Service: General;  Laterality: N/A;   LITHOTRIPSY     PACEMAKER IMPLANT N/A 04/21/2016   Procedure: Pacemaker Implant;  Surgeon: Dayle Mcnerney Meredith Leeds, MD;  Location: [place] CV [facility];  Service: Cardiovascular;  Laterality: N/A;   TONSILLECTOMY     TRANSCATHETER AORTIC VALVE REPLACEMENT, TRANSFEMORAL N/A 07/17/2017   Procedure: TRANSCATHETER AORTIC VALVE REPLACEMENT, TRANSFEMORAL;  Surgeon: Sherren Mocha, MD;  Location: [place];  Service: Open Heart Surgery;  Laterality: N/A;     Current Outpatient Medications  Medication Sig Dispense Refill   atorvastatin (LIPITOR) 20 MG tablet Take 20 mg by mouth at bedtime.      Calcium Citrate-Vitamin D (CALCIUM CITRATE + PO) Take 1 tablet by mouth 2 (two) times daily.      cholecalciferol (VITAMIN D) 1000 units tablet Take 1,000 Units by mouth 2 (two) times daily.      clotrimazole (LOTRIMIN) 1 % cream Apply to scales on toes  once daily until resolved 85 g 2   ferrous sulfate 325 (65 FE) MG tablet 1 tablet     fish oil-omega-3 fatty acids 1000 MG capsule Take 1 g by mouth every evening.      FLUoxetine (PROZAC) 40 MG capsule Take 40 mg by mouth daily.      furosemide (LASIX) 40 MG tablet Take 40 mg (1 tab) by mouth twice daily every Mon., Wed, Fri., and Sun. Take 40 mg once daily on Tues., Thurs., and Sat. 132 tablet 3   LEVEMIR FLEXTOUCH 100 UNIT/ML Pen Inject 60-70 Units into the skin 2 (two) times daily. Per sliding scale     Magnesium 400 MG CAPS Take 400 mg by mouth  at bedtime.      metFORMIN (GLUCOPHAGE) 1000 MG tablet Take 1,000 mg by mouth 2 (two) times daily with a meal.      Multiple Vitamin (MULTIVITAMIN) tablet Take 1 tablet by mouth daily.       olmesartan (BENICAR) 5 MG tablet Take by mouth.     pantoprazole (PROTONIX) 40 MG tablet Take 1 tablet (40 mg total) by mouth 2 (two) times daily. 30 tablet 0   pantoprazole (PROTONIX) 40 MG tablet Take by mouth.     saccharomyces boulardii (FLORASTOR) 250 MG capsule Take 1 capsule (250 mg total) by mouth 2 (two) times daily. You can find a probiotic over the counter. Take this while you are on antibiotics.     SHINGRIX injection      apixaban (ELIQUIS) 5 MG TABS tablet Take 1 tablet (5 mg total) by mouth 2 (two) times daily. 180 tablet 3   No current facility-administered medications for this visit.    Allergies:   Nsaids and Tape   Social History:  The patient  reports that she has quit smoking. Her smoking use included cigarettes. She has a 5.00 pack-year smoking history. She has never used smokeless tobacco. She reports that she does not drink alcohol and does not use drugs.   Family History:  The patient's family history includes Bladder Cancer in her father; Cancer in her brother and sister; Heart failure in her mother.   ROS:  Please see the history of present illness.   Otherwise, review of systems is positive for none.   All other systems are reviewed and negative.   PHYSICAL EXAM: VS:  BP 140/72   Pulse 76   Ht '5\' 7"'$  (1.702 m)   Wt 265 lb 3.2 oz (120.3 kg)   SpO2 98%   BMI 41.54 kg/m  , BMI Body mass index is 41.54 kg/m. GEN: Well nourished, well developed, in no acute distress  HEENT: normal  Neck: no JVD, carotid bruits, or masses Cardiac: RRR; no murmurs, rubs, or gallops,no edema  Respiratory:  clear to auscultation bilaterally, normal work of breathing GI: soft, nontender, nondistended, + BS MS: no deformity or atrophy  Skin: warm and dry, device site well healed Neuro:   Strength and sensation are intact Psych: euthymic mood, full affect  EKG:  EKG is ordered today. Personal review of the ekg ordered shows sinus rhythm, ventricular paced  Personal review of the device interrogation today. Results in [place]: No results found for requested labs within last 8760 hours.    Lipid Panel     Component Value Date/Time   CHOL 126 11/01/2017 1213   TRIG 127 11/01/2017 1213   HDL 58 11/01/2017 1213   CHOLHDL 2.2 11/01/2017 1213   LDLCALC 43  11/01/2017 1213     Wt Readings from Last 3 Encounters:  10/28/20 265 lb 3.2 oz (120.3 kg)  02/02/20 262 lb (118.8 kg)  10/27/19 265 lb (120.2 kg)      Other studies Reviewed: Additional studies/ records that were reviewed today include: TTE 02/06/20 Review of the above records today demonstrates:   1. Left ventricular ejection fraction, by estimation, is 60 to 65%. The  left ventricle has normal function. The left ventricle has no regional  wall motion abnormalities. There is moderate concentric left ventricular  hypertrophy. Left ventricular  diastolic parameters are consistent with Grade I diastolic dysfunction  (impaired relaxation). Elevated left atrial pressure.   2. Right ventricular systolic function is normal. The right ventricular  size is normal. There is normal pulmonary artery systolic pressure.   3. Left atrial size was moderately dilated.   4. Right atrial size was mildly dilated.   5. The mitral valve is degenerative. Mild mitral valve regurgitation.  Mild to moderate mitral stenosis. The mean mitral valve gradient is 7.2  mmHg. Severe mitral annular calcification.   6. The aortic valve has been repaired/replaced. Aortic valve  regurgitation is not visualized. Mild aortic valve stenosis. There is a 26  mm Sapien prosthetic (TAVR) valve present in the aortic position.  Procedure Date: 07/17/2017. Aortic valve mean  gradient measures 17.5 mmHg. Aortic valve Vmax measures 2.91  m/s.   7. The inferior vena cava is normal in size with greater than 50%  respiratory variability, suggesting right atrial pressure of 3 mmHg.    ASSESSMENT AND PLAN:  1.  Complete AV block status post [facility] Jude dual-chamber pacemaker implanted 04/13/2016.  Device functioning appropriately.  No changes at this time.  2.  Hypertension: Mildly elevated today but usually well controlled  3.  Moderate aortic stenosis: Status post TAVR.  Plan per primary cardiology.  4.  Atrial tachycardia: Found on device interrogation.  Currently on metoprolol.  Symptoms greatly improved.  5.  Paroxysmal atrial fibrillation: Currently on Eliquis.  CHA2DS2-VASc 5.  She is currently underdosed.  Khristopher Kapaun increase to 5 mg twice daily. . Current medicines are reviewed at length with the patient today.   The patient does not have concerns regarding her medicines.  The following changes were made today: Increase Eliquis to 5 mg twice daily  Labs/ tests ordered today include:  Orders Placed This Encounter  Procedures   EKG 12-Lead      Disposition:   FU with Giah Fickett 12 months  Signed, Markese Bloxham Meredith Leeds, MD  10/28/2020 3:24 PM     [place] [id number] [phone] (office) [phone] (fax)

## 2020-10-29 ENCOUNTER — Telehealth: Payer: Self-pay | Admitting: Cardiology

## 2020-10-29 NOTE — Telephone Encounter (Signed)
Pt returning phone call for results... please advise °

## 2020-10-29 NOTE — Telephone Encounter (Signed)
Returned call to Pt.  Per Pt she states she was returning call to April.  Reviewed results in primary nurse basket.  Call was in response to device check requesting Pt schedule f/u appt.  Pt had appt with WC 10/28/2020.  No further action needed.

## 2020-11-05 NOTE — Progress Notes (Signed)
Remote pacemaker transmission.   

## 2020-11-22 DIAGNOSIS — I10 Essential (primary) hypertension: Secondary | ICD-10-CM | POA: Diagnosis not present

## 2020-11-22 DIAGNOSIS — Z5181 Encounter for therapeutic drug level monitoring: Secondary | ICD-10-CM | POA: Diagnosis not present

## 2020-11-23 LAB — COMPREHENSIVE METABOLIC PANEL
ALT: 11 IU/L (ref 0–32)
AST: 16 IU/L (ref 0–40)
Albumin/Globulin Ratio: 1.6 (ref 1.2–2.2)
Albumin: 4.4 g/dL (ref 3.6–4.6)
Alkaline Phosphatase: 85 IU/L (ref 44–121)
BUN/Creatinine Ratio: 18 (ref 12–28)
BUN: 22 mg/dL (ref 8–27)
Bilirubin Total: 0.5 mg/dL (ref 0.0–1.2)
CO2: 25 mmol/L (ref 20–29)
Calcium: 9.9 mg/dL (ref 8.7–10.3)
Chloride: 100 mmol/L (ref 96–106)
Creatinine, Ser: 1.2 mg/dL — ABNORMAL HIGH (ref 0.57–1.00)
Globulin, Total: 2.8 g/dL (ref 1.5–4.5)
Glucose: 119 mg/dL — ABNORMAL HIGH (ref 65–99)
Potassium: 4.6 mmol/L (ref 3.5–5.2)
Sodium: 142 mmol/L (ref 134–144)
Total Protein: 7.2 g/dL (ref 6.0–8.5)
eGFR: 45 mL/min/{1.73_m2} — ABNORMAL LOW (ref >59)

## 2020-11-23 LAB — LIPID PANEL
Chol/HDL Ratio: 2.3 ratio (ref 0.0–4.4)
Cholesterol, Total: 131 mg/dL (ref 100–199)
HDL: 57 mg/dL (ref >39)
LDL Chol Calc (NIH): 50 mg/dL (ref 0–99)
Triglycerides: 143 mg/dL (ref 0–149)
VLDL Cholesterol Cal: 24 mg/dL (ref 5–40)

## 2020-11-25 ENCOUNTER — Encounter: Payer: Self-pay | Admitting: Internal Medicine

## 2020-11-25 ENCOUNTER — Ambulatory Visit (INDEPENDENT_AMBULATORY_CARE_PROVIDER_SITE_OTHER): Payer: Medicare Other | Admitting: Internal Medicine

## 2020-11-25 ENCOUNTER — Other Ambulatory Visit: Payer: Self-pay

## 2020-11-25 VITALS — BP 107/48 | HR 71 | Ht 67.0 in | Wt 260.0 lb

## 2020-11-25 DIAGNOSIS — I2699 Other pulmonary embolism without acute cor pulmonale: Secondary | ICD-10-CM

## 2020-11-25 DIAGNOSIS — Z952 Presence of prosthetic heart valve: Secondary | ICD-10-CM

## 2020-11-25 DIAGNOSIS — Z950 Presence of cardiac pacemaker: Secondary | ICD-10-CM

## 2020-11-25 DIAGNOSIS — I350 Nonrheumatic aortic (valve) stenosis: Secondary | ICD-10-CM

## 2020-11-25 DIAGNOSIS — I10 Essential (primary) hypertension: Secondary | ICD-10-CM

## 2020-11-25 DIAGNOSIS — E785 Hyperlipidemia, unspecified: Secondary | ICD-10-CM

## 2020-11-25 DIAGNOSIS — I442 Atrioventricular block, complete: Secondary | ICD-10-CM

## 2020-11-25 DIAGNOSIS — I5033 Acute on chronic diastolic (congestive) heart failure: Secondary | ICD-10-CM

## 2020-11-25 NOTE — Patient Instructions (Addendum)
Medication Instructions:  No Changes In Medications at this time.  *If you need a refill on your cardiac medications before your next appointment, please call your pharmacy*  Follow-Up: At [facility], you and your health needs are our priority.  As part of our continuing mission to provide you with exceptional heart care, we have created designated Provider Care Teams.  These Care Teams include your primary Cardiologist (physician) and Advanced Practice Providers (APPs -  Physician Assistants and Nurse Practitioners) who all work together to provide you with the care you need, when you need it.  Your next appointment:   6 month(s)  The format for your next appointment:   In Person  Provider:   Cherlynn Kaiser, MD OR APP

## 2020-11-25 NOTE — Progress Notes (Signed)
Cardiology Office Note:    Date:  11/25/2020   ID:  [name], DOB 1935/08/10, MRN [id number]  PCP:  Aretta Nip, MD  Cardiologist:  Skeet Latch, MD  Electrophysiologist:  None   Referring MD: Leighton Ruff, MD   Chief Complaint/Reason for Referral: Follow up, transfer of care  History of Present Illness:    [name] is a 85 y.o. female with a history of hypertension, diabetes, prior DVT/PE with plan for indefinite AC, severe aortic stenosis s/p TAVR, complete heart block status post pacemaker, breast cancer status post left lumpectomy, and morbid obesity who presents for follow-up. She is previously a patient of Dr. Oval Linsey but is transferring care due to office location. I am happy to assume care. Presents today with family.   Per review of Dr. Blenda Mounts last note, "Ms. Regner was admitted to the hospital 04/2016 with symptomatic bradycardia. She was noted to be in complete heart block with a ventricular rate in the 30s. Dr. Curt Bears implanted a St. Jude dual-chamber pacemaker on 04/21/16.  She had an echocardiogram 04/21/16 that revealed LVEF 60-65% with grade 1 diastolic dysfunction. She also had moderate aortic stenosis with a mean gradient of 33 mmHg. She was noted to have moderate mitral stenosis due to mitral annular calcification as well. The mean gradient 7 mmHg with a valve area by pressure half-time of 1.86 cm.  BNP was 612.    She had a repeat echocardiogram 05/2017 that revealed LVEF 55-60% and grade 1 diastolic dysfunction.  She was found to have critical aortic stenosis with a mean gradient of 62 mmHg.  She was referred to Dr. Burt Knack and underwent implantation of a 44mm Edwards Sapien 3 TAVR bioprosthesis 07/17/17.  Postoperative echo revealed normal systolic function and a mean valve gradient of 15 mmHg.  She required diuresis after the operation but was otherwise uneventful.  Her husband passed away 02-07-2018.  She and her sister now live together and she no  longer drives."  She is doing well overall with no significant concerns.  Past Medical History:  Diagnosis Date   Anxiety    Aortic stenosis    Arthritis    Breast cancer ([place])    CANCER   Diabetes mellitus    Type II   Dyspnea    Heart murmur    History of chemotherapy    History of kidney stones    Hypertension    Mitral stenosis    Morbid obesity (HCC)    BMI > 40   Ovarian cancer ([place])    Presence of permanent cardiac pacemaker    St. 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   26 mm Edwards Sapien 3 transcatheter heart valve placed via percutaneous right transfemoral approach    Spinal stenosis     Past Surgical History:  Procedure Laterality Date   ABDOMINAL HYSTERECTOMY  2013   BREAST SURGERY  03-2004   lumpectomy    CARDIAC CATHETERIZATION     INSERT / REPLACE / REMOVE PACEMAKER     INTRAOPERATIVE TRANSTHORACIC ECHOCARDIOGRAM  07/17/2017   Procedure: INTRAOPERATIVE TRANSTHORACIC ECHOCARDIOGRAM;  Surgeon: Sherren Mocha, MD;  Location: [place];  Service: Open Heart Surgery;;   LAPAROTOMY N/A 03/22/2018   Procedure: Jenetta Downer pouch, repair of bowel perforation;  Surgeon: Mickeal Skinner, MD;  Location: [place];  Service: General;  Laterality: N/A;   LITHOTRIPSY     PACEMAKER IMPLANT N/A 04/21/2016  Procedure: Pacemaker Implant;  Surgeon: Will Meredith Leeds, MD;  Location: [place] [place] [facility];  Service: Cardiovascular;  Laterality: N/A;   TONSILLECTOMY     TRANSCATHETER AORTIC VALVE REPLACEMENT, TRANSFEMORAL N/A 07/17/2017   Procedure: TRANSCATHETER AORTIC VALVE REPLACEMENT, TRANSFEMORAL;  Surgeon: Sherren Mocha, MD;  Location: [facility];  Service: Open Heart Surgery;  Laterality: N/A;    Current Medications: Current Meds  Medication Sig   apixaban (ELIQUIS) 5 MG TABS tablet Take 1 tablet (5 mg total) by mouth 2 (two) times daily.   atorvastatin (LIPITOR) 20 MG tablet Take 20 mg by mouth at  bedtime.    Calcium Citrate-Vitamin D (CALCIUM CITRATE + PO) Take 1 tablet by mouth 2 (two) times daily.    cholecalciferol (VITAMIN D) 1000 units tablet Take 1,000 Units by mouth 2 (two) times daily.    clotrimazole (LOTRIMIN) 1 % cream Apply to scales on toes once daily until resolved   ferrous sulfate 325 (65 FE) MG tablet 1 tablet   fish oil-omega-3 fatty acids 1000 MG capsule Take 1 g by mouth every evening.    FLUoxetine (PROZAC) 40 MG capsule Take 40 mg by mouth daily.    furosemide (LASIX) 40 MG tablet Take 40 mg (1 tab) by mouth twice daily every Mon., Wed, Fri., and Sun. Take 40 mg once daily on Tues., Thurs., and Sat.   LEVEMIR FLEXTOUCH 100 UNIT/ML Pen Inject 60-70 Units into the skin 2 (two) times daily. Per sliding scale   Magnesium 400 MG CAPS Take 400 mg by mouth at bedtime.    metFORMIN (GLUCOPHAGE) 1000 MG tablet Take 1,000 mg by mouth 2 (two) times daily with a meal.    Multiple Vitamin (MULTIVITAMIN) tablet Take 1 tablet by mouth daily.     olmesartan (BENICAR) 5 MG tablet Take by mouth.   pantoprazole (PROTONIX) 40 MG tablet Take 1 tablet (40 mg total) by mouth 2 (two) times daily.   pantoprazole (PROTONIX) 40 MG tablet Take by mouth.   saccharomyces boulardii (FLORASTOR) 250 MG capsule Take 1 capsule (250 mg total) by mouth 2 (two) times daily. You can find a probiotic over the counter. Take this while you are on antibiotics.   SHINGRIX injection      Allergies:   Nsaids and Tape   Social History   Tobacco Use   Smoking status: Former    Packs/day: 1.00    Years: 5.00    Pack years: 5.00    Types: Cigarettes   Smokeless tobacco: Never  Vaping Use   Vaping Use: Never used  Substance Use Topics   Alcohol use: No   Drug use: No     Family History: The patient's family history includes Bladder Cancer in her father; Cancer in her brother and sister; Heart failure in her mother.  ROS:   Please see the history of present illness.    All other systems reviewed  and are negative.  EKGs/Labs/Other Studies Reviewed:    The following studies were reviewed today:  EKG:  n/a  Imaging studies that I have independently reviewed today: n/a  Recent Labs: 11/22/2020: ALT 11; BUN 22; Creatinine, Ser 1.20; Potassium 4.6; Sodium 142  Recent Lipid Panel    Component Value Date/Time   CHOL 131 11/22/2020 1448   TRIG 143 11/22/2020 1448   HDL 57 11/22/2020 1448   CHOLHDL 2.3 11/22/2020 1448   LDLCALC 50 11/22/2020 1448    Physical Exam:    VS:  BP (!) 107/48   Pulse 71  Ht 5\' 7"  (1.702 m)   Wt 260 lb (117.9 kg)   SpO2 99%   BMI 40.72 kg/m     Wt Readings from Last 5 Encounters:  11/25/20 260 lb (117.9 kg)  10/28/20 265 lb 3.2 oz (120.3 kg)  02/02/20 262 lb (118.8 kg)  10/27/19 265 lb (120.2 kg)  07/19/19 273 lb 9.5 oz (124.1 kg)    Constitutional: No acute distress Eyes: sclera non-icteric, normal conjunctiva and lids ENMT: normal dentition, moist mucous membranes Cardiovascular: regular rhythm, normal rate, no murmurs. S1 and S2 normal. Radial pulses normal bilaterally. No jugular venous distention.  Respiratory: clear to auscultation bilaterally GI : normal bowel sounds, soft and nontender. No distention.   MSK: extremities warm, well perfused. No edema.  NEURO: grossly nonfocal exam, moves all extremities. PSYCH: alert and oriented x 3, normal mood and affect.   ASSESSMENT:    1. Complete heart block ([place])   2. S/P placement of cardiac pacemaker   3. Aortic valve stenosis, etiology of cardiac valve disease unspecified   4. S/P TAVR (transcatheter aortic valve replacement)   5. Bilateral pulmonary embolism ([place])   6. Essential hypertension   7. Hyperlipidemia, unspecified hyperlipidemia type   8. Acute on chronic diastolic heart failure (HCC)    PLAN:    Complete heart block (HCC) S/P placement of cardiac pacemaker -followed by Dr. Curt Bears, stable.  Aortic valve stenosis, etiology of cardiac valve disease unspecified S/P  TAVR (transcatheter aortic valve replacement) - stable on last echo  Bilateral pulmonary embolism (HCC) - continues on eliquis 5 mg BID. Prior notes suggest transition to 2.5 mg BID but this has not been done. Cr and weight qualify for full dose anticoagulation, will continue for now.   Essential hypertension Acute on chronic diastolic HF - continue olmesartan 5 mg daily and lasix 40 mg daily. Appears euvolemic with class I-II HF sx.   HLD - continue atorvastatin 20 mg daily.   Total time of encounter: 30 minutes total time of encounter, including 20 minutes spent in face-to-face patient care on the date of this encounter. This time includes coordination of care and counseling regarding above mentioned problem list. Remainder of non-face-to-face time involved reviewing chart documents/testing relevant to the patient encounter and documentation in the medical record. I have independently reviewed documentation from referring provider.   Cherlynn Kaiser, MD, [place]   Shared Decision Making/Informed Consent:       Medication Adjustments/Labs and Tests Ordered: Current medicines are reviewed at length with the patient today.  Concerns regarding medicines are outlined above.   No orders of the defined types were placed in this encounter.   No orders of the defined types were placed in this encounter.   Patient Instructions  Medication Instructions:  No Changes In Medications at this time.  *If you need a refill on your cardiac medications before your next appointment, please call your pharmacy*  Follow-Up: At [facility], you and your health needs are our priority.  As part of our continuing mission to provide you with exceptional heart care, we have created designated Provider Care Teams.  These Care Teams include your primary Cardiologist (physician) and Advanced Practice Providers (APPs -  Physician Assistants and Nurse Practitioners) who all work  together to provide you with the care you need, when you need it.  Your next appointment:   6 month(s)  The format for your next appointment:   In Person  Provider:   Cherlynn Kaiser,  MD OR APP

## 2021-01-17 ENCOUNTER — Ambulatory Visit (INDEPENDENT_AMBULATORY_CARE_PROVIDER_SITE_OTHER): Payer: Medicare Other

## 2021-01-17 DIAGNOSIS — I442 Atrioventricular block, complete: Secondary | ICD-10-CM

## 2021-01-17 DIAGNOSIS — E785 Hyperlipidemia, unspecified: Secondary | ICD-10-CM | POA: Diagnosis not present

## 2021-01-17 DIAGNOSIS — I10 Essential (primary) hypertension: Secondary | ICD-10-CM | POA: Diagnosis not present

## 2021-01-17 DIAGNOSIS — I5033 Acute on chronic diastolic (congestive) heart failure: Secondary | ICD-10-CM | POA: Diagnosis not present

## 2021-01-17 DIAGNOSIS — E7800 Pure hypercholesterolemia, unspecified: Secondary | ICD-10-CM | POA: Diagnosis not present

## 2021-01-17 DIAGNOSIS — E1169 Type 2 diabetes mellitus with other specified complication: Secondary | ICD-10-CM | POA: Diagnosis not present

## 2021-01-17 DIAGNOSIS — I4891 Unspecified atrial fibrillation: Secondary | ICD-10-CM | POA: Diagnosis not present

## 2021-01-17 DIAGNOSIS — N183 Chronic kidney disease, stage 3 unspecified: Secondary | ICD-10-CM | POA: Diagnosis not present

## 2021-01-17 DIAGNOSIS — F3341 Major depressive disorder, recurrent, in partial remission: Secondary | ICD-10-CM | POA: Diagnosis not present

## 2021-01-17 LAB — CUP PACEART REMOTE DEVICE CHECK
Battery Remaining Longevity: 62 mo
Battery Remaining Percentage: 55 %
Battery Voltage: 2.99 V
Brady Statistic AP VP Percent: 1.2 %
Brady Statistic AP VS Percent: 1 %
Brady Statistic AS VP Percent: 98 %
Brady Statistic AS VS Percent: 1 %
Brady Statistic RA Percent Paced: 1 %
Brady Statistic RV Percent Paced: 99 %
Date Time Interrogation Session: 20221114020014
Implantable Lead Implant Date: 20180216
Implantable Lead Implant Date: 20180216
Implantable Lead Location: 753859
Implantable Lead Location: 753860
Implantable Pulse Generator Implant Date: 20180216
Lead Channel Impedance Value: 400 Ohm
Lead Channel Impedance Value: 410 Ohm
Lead Channel Pacing Threshold Amplitude: 0.5 V
Lead Channel Pacing Threshold Amplitude: 0.75 V
Lead Channel Pacing Threshold Pulse Width: 0.5 ms
Lead Channel Pacing Threshold Pulse Width: 0.5 ms
Lead Channel Sensing Intrinsic Amplitude: 12 mV
Lead Channel Sensing Intrinsic Amplitude: 2.7 mV
Lead Channel Setting Pacing Amplitude: 1 V
Lead Channel Setting Pacing Amplitude: 2 V
Lead Channel Setting Pacing Pulse Width: 0.5 ms
Lead Channel Setting Sensing Sensitivity: 2.5 mV
Pulse Gen Model: 2272
Pulse Gen Serial Number: 7998131

## 2021-01-19 ENCOUNTER — Encounter: Payer: Self-pay | Admitting: Podiatry

## 2021-01-19 ENCOUNTER — Ambulatory Visit (INDEPENDENT_AMBULATORY_CARE_PROVIDER_SITE_OTHER): Payer: Medicare Other | Admitting: Podiatry

## 2021-01-19 ENCOUNTER — Other Ambulatory Visit: Payer: Self-pay

## 2021-01-19 DIAGNOSIS — M79674 Pain in right toe(s): Secondary | ICD-10-CM | POA: Diagnosis not present

## 2021-01-19 DIAGNOSIS — E1142 Type 2 diabetes mellitus with diabetic polyneuropathy: Secondary | ICD-10-CM | POA: Diagnosis not present

## 2021-01-19 DIAGNOSIS — B351 Tinea unguium: Secondary | ICD-10-CM | POA: Diagnosis not present

## 2021-01-19 DIAGNOSIS — M79675 Pain in left toe(s): Secondary | ICD-10-CM

## 2021-01-21 DIAGNOSIS — Z20822 Contact with and (suspected) exposure to covid-19: Secondary | ICD-10-CM | POA: Diagnosis not present

## 2021-01-23 NOTE — Progress Notes (Signed)
  Subjective:  Patient ID: [name], female    DOB: Mar 11, 1935,  MRN: [id number]  [name] presents to clinic today for at risk foot care with history of diabetic neuropathy and painful elongated mycotic toenails 1-5 bilaterally which are tender when wearing enclosed shoe gear. Pain is relieved with periodic professional debridement.  Patient states blood glucose was 168 mg/dl today. [name] states she has been applying Clotrimazole Cream to the scaling on her feet as instructed. She voices no new pedal problems on today's visit.  PCP is Rankins, Bill Salinas, MD , and last visit was 08/09/2020.  Allergies  Allergen Reactions   Nsaids Anaphylaxis and Other (See Comments)   Tape Rash    CLEAR PLASTIC TAPE    Review of Systems: Negative except as noted in the HPI. Objective:   Constitutional [name] is a pleasant 85 y.o. Caucasian female, morbidly obese in NAD. AAO x 3.   Vascular CFT <3 seconds b/l LE. Palpable DP pulse(s) b/l LE. Nonpalpable PT pulse(s) b/l LE. Pedal hair absent. No pain with calf compression RLE. Lymphedema present BLE. No cyanosis or clubbing noted.  Neurologic Normal speech. Oriented to person, place, and time. Protective sensation decreased with 10 gram monofilament b/l. Vibratory sensation diminished b/l.  Dermatologic Marked improvement in scaling of digits, feet and legs with Clotrimazole Cream; skin nice and pink. No open wounds b/l LE. No interdigital macerations noted b/l LE. Toenails 1-5 b/l elongated, discolored, dystrophic, thickened, crumbly with subungual debris and tenderness to dorsal palpation. No hyperkeratotic nor porokeratotic lesions present on today's visit.  Orthopedic: Muscle strength 5/5 to all lower extremity muscle groups bilaterally. HAV with bunion deformity noted b/l LE. Utilizes wheelchair for mobility assistance.   Radiographs: None Assessment:   1. Pain due to onychomycosis of toenails of both feet   2. Diabetic peripheral  neuropathy associated with type 2 diabetes mellitus ([place])    Plan:  Patient was evaluated and treated and all questions answered. Consent given for treatment as described below: -Examined patient. -Continue diabetic foot care principles. Very noticeable improvement in her skin today. Continue Clotrimazole Cream 1% to scaly areas of feet toes bid until resolved. -Mycotic toenails 1-5 bilaterally were debrided in length and girth with sterile nail nippers and dremel without incident. -Patient/POA to call should there be question/concern in the interim.  Return in about 3 months (around 04/21/2021).  Marzetta Board, DPM

## 2021-01-25 NOTE — Progress Notes (Signed)
Remote pacemaker transmission.   

## 2021-02-09 DIAGNOSIS — M170 Bilateral primary osteoarthritis of knee: Secondary | ICD-10-CM | POA: Diagnosis not present

## 2021-02-09 DIAGNOSIS — E7800 Pure hypercholesterolemia, unspecified: Secondary | ICD-10-CM | POA: Diagnosis not present

## 2021-02-09 DIAGNOSIS — N183 Chronic kidney disease, stage 3 unspecified: Secondary | ICD-10-CM | POA: Diagnosis not present

## 2021-02-09 DIAGNOSIS — I442 Atrioventricular block, complete: Secondary | ICD-10-CM | POA: Diagnosis not present

## 2021-02-09 DIAGNOSIS — F339 Major depressive disorder, recurrent, unspecified: Secondary | ICD-10-CM | POA: Diagnosis not present

## 2021-02-09 DIAGNOSIS — I4891 Unspecified atrial fibrillation: Secondary | ICD-10-CM | POA: Diagnosis not present

## 2021-02-09 DIAGNOSIS — E1169 Type 2 diabetes mellitus with other specified complication: Secondary | ICD-10-CM | POA: Diagnosis not present

## 2021-02-09 DIAGNOSIS — I10 Essential (primary) hypertension: Secondary | ICD-10-CM | POA: Diagnosis not present

## 2021-02-09 DIAGNOSIS — F419 Anxiety disorder, unspecified: Secondary | ICD-10-CM | POA: Diagnosis not present

## 2021-02-18 DIAGNOSIS — E7800 Pure hypercholesterolemia, unspecified: Secondary | ICD-10-CM | POA: Diagnosis not present

## 2021-02-18 DIAGNOSIS — I10 Essential (primary) hypertension: Secondary | ICD-10-CM | POA: Diagnosis not present

## 2021-02-18 DIAGNOSIS — E1169 Type 2 diabetes mellitus with other specified complication: Secondary | ICD-10-CM | POA: Diagnosis not present

## 2021-02-18 DIAGNOSIS — Z23 Encounter for immunization: Secondary | ICD-10-CM | POA: Diagnosis not present

## 2021-03-22 ENCOUNTER — Other Ambulatory Visit: Payer: Self-pay | Admitting: Cardiovascular Disease

## 2021-04-18 ENCOUNTER — Ambulatory Visit (INDEPENDENT_AMBULATORY_CARE_PROVIDER_SITE_OTHER): Payer: Medicare Other

## 2021-04-18 DIAGNOSIS — I442 Atrioventricular block, complete: Secondary | ICD-10-CM | POA: Diagnosis not present

## 2021-04-18 LAB — CUP PACEART REMOTE DEVICE CHECK
Battery Remaining Longevity: 62 mo
Battery Remaining Percentage: 52 %
Battery Voltage: 2.98 V
Brady Statistic AP VP Percent: 3.9 %
Brady Statistic AP VS Percent: 1 %
Brady Statistic AS VP Percent: 95 %
Brady Statistic AS VS Percent: 1 %
Brady Statistic RA Percent Paced: 3.2 %
Brady Statistic RV Percent Paced: 99 %
Date Time Interrogation Session: 20230213020016
Implantable Lead Implant Date: 20180216
Implantable Lead Implant Date: 20180216
Implantable Lead Location: 753859
Implantable Lead Location: 753860
Implantable Pulse Generator Implant Date: 20180216
Lead Channel Impedance Value: 360 Ohm
Lead Channel Impedance Value: 390 Ohm
Lead Channel Pacing Threshold Amplitude: 0.5 V
Lead Channel Pacing Threshold Amplitude: 0.875 V
Lead Channel Pacing Threshold Pulse Width: 0.5 ms
Lead Channel Pacing Threshold Pulse Width: 0.5 ms
Lead Channel Sensing Intrinsic Amplitude: 12 mV
Lead Channel Sensing Intrinsic Amplitude: 2 mV
Lead Channel Setting Pacing Amplitude: 1.125
Lead Channel Setting Pacing Amplitude: 2 V
Lead Channel Setting Pacing Pulse Width: 0.5 ms
Lead Channel Setting Sensing Sensitivity: 2.5 mV
Pulse Gen Model: 2272
Pulse Gen Serial Number: 7998131

## 2021-04-21 NOTE — Progress Notes (Signed)
Remote pacemaker transmission.   

## 2021-04-29 ENCOUNTER — Other Ambulatory Visit: Payer: Self-pay

## 2021-04-29 ENCOUNTER — Ambulatory Visit (INDEPENDENT_AMBULATORY_CARE_PROVIDER_SITE_OTHER): Payer: Medicare Other | Admitting: Podiatry

## 2021-04-29 ENCOUNTER — Encounter: Payer: Self-pay | Admitting: Podiatry

## 2021-04-29 DIAGNOSIS — L97801 Non-pressure chronic ulcer of other part of unspecified lower leg limited to breakdown of skin: Secondary | ICD-10-CM

## 2021-04-29 DIAGNOSIS — M79675 Pain in left toe(s): Secondary | ICD-10-CM

## 2021-04-29 DIAGNOSIS — I83008 Varicose veins of unspecified lower extremity with ulcer other part of lower leg: Secondary | ICD-10-CM

## 2021-04-29 DIAGNOSIS — B351 Tinea unguium: Secondary | ICD-10-CM

## 2021-04-29 DIAGNOSIS — E1142 Type 2 diabetes mellitus with diabetic polyneuropathy: Secondary | ICD-10-CM

## 2021-04-29 DIAGNOSIS — M79674 Pain in right toe(s): Secondary | ICD-10-CM

## 2021-04-29 MED ORDER — CEPHALEXIN 500 MG PO CAPS: 500.0000 mg | ORAL_CAPSULE | Freq: Four times a day (QID) | ORAL | 0 refills | Status: AC

## 2021-04-29 NOTE — Patient Instructions (Signed)
DRESSING CHANGES BILATERAL LOWER EXTREMITIES:   PHARMACY SHOPPING LIST: DIAL ANTIBACTERIAL SOAP  A. IF DISPENSED, WEAR SURGICAL SHOE OR WALKING BOOT AT ALL TIMES.  B. IF PRESCRIBED ORAL ANTIBIOTICS, TAKE ALL MEDICATION AS PRESCRIBED UNTIL ALL ARE GONE.  C. IF DOCTOR HAS DESIGNATED NONWEIGHTBEARING STATUS, PLEASE ADHERE TO INSTRUCTIONS.  CLEANSE ULCER WITH DIAL ANTIBACTERIAL SOAP.   DAB DRY WITH GAUZE SPONGE.  IF YOU EXPERIENCE ANY FEVER, CHILLS, NIGHTSWEATS, NAUSEA OR VOMITING, ELEVATED OR LOW BLOOD SUGARS, REPORT TO EMERGENCY ROOM.  IF YOU EXPERIENCE INCREASED REDNESS, PAIN, SWELLING, DISCOLORATION, ODOR, PUS, DRAINAGE OR WARMTH OF YOUR FOOT, REPORT TO EMERGENCY ROOM.

## 2021-04-30 ENCOUNTER — Encounter: Payer: Self-pay | Admitting: Podiatry

## 2021-04-30 NOTE — Progress Notes (Signed)
Subjective: [name] is a 86 y.o. female patient seen today for follow up of  at risk foot care with history of diabetic neuropathy and painful elongated mycotic toenails 1-5 bilaterally which are tender when wearing enclosed shoe gear. Pain is relieved with periodic professional debridement..   Patient states their blood glucose was 253 mg/dl today.   Patient is accompanied by niece, [name], who is her POA.  New problem(s)/concern(s) today:   worsening condition of both legs. Patient has been seeing a dermatologist and has been applying triamcinolone to her legs. Patient notes increased weeping drainage and change in appearance since starting this treatment. Denies any fever, chills, night sweats, nausea or vomiting.             PCP is Rankins, Bill Salinas, MD. Last visit was: 02/18/2021.  Allergies  Allergen Reactions   Nsaids Anaphylaxis and Other (See Comments)   Tape Rash    CLEAR PLASTIC TAPE    Objective: Physical Exam  General: Patient is a pleasant 86 y.o. Caucasian female morbidly obese in NAD. AAO x 3.   Neurovascular Examination: CFT <3 seconds b/l LE. Palpable DP pulse(s) b/l LE. Diminished PT pulse(s) b/l LE. No pain with calf compression b/l. Lower extremity skin temperature gradient within normal limits. Evidence of chronic venous insufficiency b/l LE. No ischemia or gangrene noted b/l LE. No cyanosis or clubbing noted b/l LE.  Protective sensation decreased with 10 gram monofilament b/l. Vibratory sensation diminished b/l.  Dermatological:  Venous stasis ulcerations noted with inflammatory fibrosis of multiple areas of both legs. No purulent drainage. No odor. She has multiple areas of hyperpigmented plaques. She does have some weeping of both legs. I do not feel any increased warmth of either leg. No interdigital macerations b/l lower extremities. Toenails 1-5 bilaterally elongated, discolored, dystrophic, thickened, and crumbly with subungual  debris and tenderness to dorsal palpation.  Musculoskeletal:  Noted disuse atrophy bilaterally. HAV with bunion deformity noted b/l LE. Utilizes transport chair for ambulation assistance.  Assessment: 1. Pain due to onychomycosis of toenails of both feet   2. Venous stasis ulcer of other part of lower leg limited to breakdown of skin with varicose veins, unspecified laterality ([place])   3. Diabetic peripheral neuropathy associated with type 2 diabetes mellitus ([place])    Plan: Patient was evaluated and treated and all questions answered. Consent given for treatment as described below: -Examined patient. -Toenails 1-5 b/l were debrided in length and girth with sterile nail nippers and dremel without iatrogenic bleeding.  -For venous stasis ulcerations, recommended discontinuing triamcinolone for now. Start cleansing legs with baby wash cloth and Dial antibacterial soap and dab dry with gauze. Rx sent to pharmacy for Keflex 500 mg po, #40, to be taken one capsule four times daily for 10 days. We also discussed introducing Home Health services for assistance in management of wounds. -Urgent referral placed to [facility] for management of venous stasis ulcerations. -Patient/POA to call should there be question/concern in the interim.  Return in about 3 months (around 07/27/2021).  Marzetta Board, DPM

## 2021-05-03 ENCOUNTER — Other Ambulatory Visit (INDEPENDENT_AMBULATORY_CARE_PROVIDER_SITE_OTHER): Payer: Medicare Other | Admitting: Podiatry

## 2021-05-03 ENCOUNTER — Telehealth: Payer: Self-pay | Admitting: *Deleted

## 2021-05-03 DIAGNOSIS — B353 Tinea pedis: Secondary | ICD-10-CM

## 2021-05-03 MED ORDER — CLOTRIMAZOLE 1 % EX CREA: TOPICAL_CREAM | CUTANEOUS | 2 refills | Status: AC

## 2021-05-03 NOTE — Telephone Encounter (Signed)
Completed, prescription sent.

## 2021-05-03 NOTE — Telephone Encounter (Signed)
Patient is calling for a refill of clotrimazole 1% cream, sent to pharmacy on file. Please advise.

## 2021-05-04 ENCOUNTER — Ambulatory Visit: Payer: Medicare Other | Admitting: Physician Assistant

## 2021-05-09 ENCOUNTER — Telehealth: Payer: Self-pay | Admitting: *Deleted

## 2021-05-09 ENCOUNTER — Encounter: Payer: Self-pay | Admitting: Podiatry

## 2021-05-09 ENCOUNTER — Other Ambulatory Visit (INDEPENDENT_AMBULATORY_CARE_PROVIDER_SITE_OTHER): Payer: Medicare Other | Admitting: Podiatry

## 2021-05-09 DIAGNOSIS — I83008 Varicose veins of unspecified lower extremity with ulcer other part of lower leg: Secondary | ICD-10-CM

## 2021-05-09 DIAGNOSIS — L97801 Non-pressure chronic ulcer of other part of unspecified lower leg limited to breakdown of skin: Secondary | ICD-10-CM

## 2021-05-09 DIAGNOSIS — E1142 Type 2 diabetes mellitus with diabetic polyneuropathy: Secondary | ICD-10-CM

## 2021-05-09 DIAGNOSIS — I890 Lymphedema, not elsewhere classified: Secondary | ICD-10-CM

## 2021-05-09 NOTE — Telephone Encounter (Signed)
Called and faxed the home health referral to [facility] (05/09/21), will contact if not able to accommodate. ?

## 2021-05-09 NOTE — Progress Notes (Signed)
Orders sent to [facility] for skilled nursing. Evaluate and treat bilateral venous stasis ulcerations. ?

## 2021-05-09 NOTE — Telephone Encounter (Signed)
Please advise 

## 2021-05-11 ENCOUNTER — Encounter: Payer: Self-pay | Admitting: Podiatry

## 2021-05-20 ENCOUNTER — Other Ambulatory Visit: Payer: Self-pay

## 2021-05-20 ENCOUNTER — Encounter (HOSPITAL_BASED_OUTPATIENT_CLINIC_OR_DEPARTMENT_OTHER): Payer: Medicare Other | Attending: Internal Medicine | Admitting: Internal Medicine

## 2021-05-20 DIAGNOSIS — I890 Lymphedema, not elsewhere classified: Secondary | ICD-10-CM | POA: Insufficient documentation

## 2021-05-20 DIAGNOSIS — Z7984 Long term (current) use of oral hypoglycemic drugs: Secondary | ICD-10-CM | POA: Diagnosis not present

## 2021-05-20 DIAGNOSIS — Z9221 Personal history of antineoplastic chemotherapy: Secondary | ICD-10-CM | POA: Diagnosis not present

## 2021-05-20 DIAGNOSIS — M1990 Unspecified osteoarthritis, unspecified site: Secondary | ICD-10-CM | POA: Insufficient documentation

## 2021-05-20 DIAGNOSIS — Z952 Presence of prosthetic heart valve: Secondary | ICD-10-CM | POA: Diagnosis not present

## 2021-05-20 DIAGNOSIS — E11621 Type 2 diabetes mellitus with foot ulcer: Secondary | ICD-10-CM | POA: Insufficient documentation

## 2021-05-20 DIAGNOSIS — L97812 Non-pressure chronic ulcer of other part of right lower leg with fat layer exposed: Secondary | ICD-10-CM | POA: Insufficient documentation

## 2021-05-20 DIAGNOSIS — I10 Essential (primary) hypertension: Secondary | ICD-10-CM | POA: Diagnosis not present

## 2021-05-20 DIAGNOSIS — E11622 Type 2 diabetes mellitus with other skin ulcer: Secondary | ICD-10-CM | POA: Insufficient documentation

## 2021-05-20 DIAGNOSIS — Z950 Presence of cardiac pacemaker: Secondary | ICD-10-CM | POA: Insufficient documentation

## 2021-05-20 DIAGNOSIS — Z794 Long term (current) use of insulin: Secondary | ICD-10-CM | POA: Insufficient documentation

## 2021-05-20 DIAGNOSIS — L97822 Non-pressure chronic ulcer of other part of left lower leg with fat layer exposed: Secondary | ICD-10-CM | POA: Insufficient documentation

## 2021-05-24 ENCOUNTER — Encounter (HOSPITAL_COMMUNITY): Payer: Self-pay

## 2021-05-24 ENCOUNTER — Inpatient Hospital Stay (HOSPITAL_COMMUNITY)
Admission: EM | Admit: 2021-05-24 | Discharge: 2021-05-29 | DRG: 378 | Disposition: A | Discharge status: Home Health | Payer: Medicare Other | Attending: Internal Medicine | Admitting: Internal Medicine

## 2021-05-24 ENCOUNTER — Other Ambulatory Visit: Payer: Self-pay

## 2021-05-24 DIAGNOSIS — L97829 Non-pressure chronic ulcer of other part of left lower leg with unspecified severity: Secondary | ICD-10-CM | POA: Diagnosis present

## 2021-05-24 DIAGNOSIS — Z66 Do not resuscitate: Secondary | ICD-10-CM | POA: Diagnosis not present

## 2021-05-24 DIAGNOSIS — I129 Hypertensive chronic kidney disease with stage 1 through stage 4 chronic kidney disease, or unspecified chronic kidney disease: Secondary | ICD-10-CM | POA: Diagnosis present

## 2021-05-24 DIAGNOSIS — Z86711 Personal history of pulmonary embolism: Secondary | ICD-10-CM

## 2021-05-24 DIAGNOSIS — Z953 Presence of xenogenic heart valve: Secondary | ICD-10-CM

## 2021-05-24 DIAGNOSIS — Z86718 Personal history of other venous thrombosis and embolism: Secondary | ICD-10-CM

## 2021-05-24 DIAGNOSIS — N179 Acute kidney failure, unspecified: Secondary | ICD-10-CM | POA: Diagnosis not present

## 2021-05-24 DIAGNOSIS — E1165 Type 2 diabetes mellitus with hyperglycemia: Secondary | ICD-10-CM | POA: Diagnosis present

## 2021-05-24 DIAGNOSIS — D649 Anemia, unspecified: Secondary | ICD-10-CM | POA: Diagnosis not present

## 2021-05-24 DIAGNOSIS — Z8052 Family history of malignant neoplasm of bladder: Secondary | ICD-10-CM

## 2021-05-24 DIAGNOSIS — N3000 Acute cystitis without hematuria: Secondary | ICD-10-CM | POA: Diagnosis not present

## 2021-05-24 DIAGNOSIS — Z800 Family history of malignant neoplasm of digestive organs: Secondary | ICD-10-CM

## 2021-05-24 DIAGNOSIS — D509 Iron deficiency anemia, unspecified: Secondary | ICD-10-CM | POA: Diagnosis present

## 2021-05-24 DIAGNOSIS — K645 Perianal venous thrombosis: Secondary | ICD-10-CM | POA: Diagnosis present

## 2021-05-24 DIAGNOSIS — D62 Acute posthemorrhagic anemia: Secondary | ICD-10-CM | POA: Diagnosis not present

## 2021-05-24 DIAGNOSIS — Z7901 Long term (current) use of anticoagulants: Secondary | ICD-10-CM

## 2021-05-24 DIAGNOSIS — Z8249 Family history of ischemic heart disease and other diseases of the circulatory system: Secondary | ICD-10-CM

## 2021-05-24 DIAGNOSIS — K641 Second degree hemorrhoids: Secondary | ICD-10-CM | POA: Diagnosis present

## 2021-05-24 DIAGNOSIS — Z90710 Acquired absence of both cervix and uterus: Secondary | ICD-10-CM

## 2021-05-24 DIAGNOSIS — I890 Lymphedema, not elsewhere classified: Secondary | ICD-10-CM

## 2021-05-24 DIAGNOSIS — K922 Gastrointestinal hemorrhage, unspecified: Secondary | ICD-10-CM | POA: Diagnosis present

## 2021-05-24 DIAGNOSIS — Z9221 Personal history of antineoplastic chemotherapy: Secondary | ICD-10-CM

## 2021-05-24 DIAGNOSIS — Z6838 Body mass index (BMI) 38.0-38.9, adult: Secondary | ICD-10-CM

## 2021-05-24 DIAGNOSIS — N1831 Chronic kidney disease, stage 3a: Secondary | ICD-10-CM

## 2021-05-24 DIAGNOSIS — K635 Polyp of colon: Secondary | ICD-10-CM | POA: Diagnosis present

## 2021-05-24 DIAGNOSIS — Z950 Presence of cardiac pacemaker: Secondary | ICD-10-CM

## 2021-05-24 DIAGNOSIS — E1122 Type 2 diabetes mellitus with diabetic chronic kidney disease: Secondary | ICD-10-CM | POA: Diagnosis present

## 2021-05-24 DIAGNOSIS — Z8543 Personal history of malignant neoplasm of ovary: Secondary | ICD-10-CM

## 2021-05-24 DIAGNOSIS — K5521 Angiodysplasia of colon with hemorrhage: Secondary | ICD-10-CM | POA: Diagnosis not present

## 2021-05-24 DIAGNOSIS — E119 Type 2 diabetes mellitus without complications: Secondary | ICD-10-CM

## 2021-05-24 DIAGNOSIS — I4891 Unspecified atrial fibrillation: Secondary | ICD-10-CM | POA: Diagnosis present

## 2021-05-24 DIAGNOSIS — Z20822 Contact with and (suspected) exposure to covid-19: Secondary | ICD-10-CM | POA: Diagnosis present

## 2021-05-24 DIAGNOSIS — E669 Obesity, unspecified: Secondary | ICD-10-CM | POA: Diagnosis present

## 2021-05-24 DIAGNOSIS — K219 Gastro-esophageal reflux disease without esophagitis: Secondary | ICD-10-CM | POA: Diagnosis present

## 2021-05-24 DIAGNOSIS — Z87891 Personal history of nicotine dependence: Secondary | ICD-10-CM

## 2021-05-24 DIAGNOSIS — L97819 Non-pressure chronic ulcer of other part of right lower leg with unspecified severity: Secondary | ICD-10-CM | POA: Diagnosis present

## 2021-05-24 DIAGNOSIS — Z794 Long term (current) use of insulin: Secondary | ICD-10-CM

## 2021-05-24 DIAGNOSIS — Z952 Presence of prosthetic heart valve: Secondary | ICD-10-CM

## 2021-05-24 DIAGNOSIS — Z7984 Long term (current) use of oral hypoglycemic drugs: Secondary | ICD-10-CM

## 2021-05-24 DIAGNOSIS — K2971 Gastritis, unspecified, with bleeding: Secondary | ICD-10-CM | POA: Diagnosis present

## 2021-05-24 DIAGNOSIS — Z853 Personal history of malignant neoplasm of breast: Secondary | ICD-10-CM

## 2021-05-24 DIAGNOSIS — I10 Essential (primary) hypertension: Secondary | ICD-10-CM | POA: Diagnosis present

## 2021-05-24 LAB — CBC WITH DIFFERENTIAL/PLATELET
Abs Immature Granulocytes: 0.05 10*3/uL (ref 0.00–0.07)
Basophils Absolute: 0 10*3/uL (ref 0.0–0.1)
Basophils Relative: 0 %
Eosinophils Absolute: 0.1 10*3/uL (ref 0.0–0.5)
Eosinophils Relative: 1 %
HCT: 17.6 % — ABNORMAL LOW (ref 36.0–46.0)
Hemoglobin: 4.7 g/dL — CL (ref 12.0–15.0)
Immature Granulocytes: 1 %
Lymphocytes Relative: 16 %
Lymphs Abs: 1.4 10*3/uL (ref 0.7–4.0)
MCH: 20.2 pg — ABNORMAL LOW (ref 26.0–34.0)
MCHC: 26.7 g/dL — ABNORMAL LOW (ref 30.0–36.0)
MCV: 75.5 fL — ABNORMAL LOW (ref 80.0–100.0)
Monocytes Absolute: 1.1 10*3/uL — ABNORMAL HIGH (ref 0.1–1.0)
Monocytes Relative: 13 %
Neutro Abs: 6 10*3/uL (ref 1.7–7.7)
Neutrophils Relative %: 69 %
Platelets: 314 10*3/uL (ref 150–400)
RBC: 2.33 MIL/uL — ABNORMAL LOW (ref 3.87–5.11)
RDW: 20.2 % — ABNORMAL HIGH (ref 11.5–15.5)
WBC: 8.7 10*3/uL (ref 4.0–10.5)
nRBC: 2.5 % — ABNORMAL HIGH (ref 0.0–0.2)

## 2021-05-24 LAB — FOLATE: Folate: 61.5 ng/mL (ref >5.9)

## 2021-05-24 LAB — IRON AND TIBC
Iron: 18 ug/dL — ABNORMAL LOW (ref 28–170)
Saturation Ratios: 4 % — ABNORMAL LOW (ref 10.4–31.8)
TIBC: 487 ug/dL — ABNORMAL HIGH (ref 250–450)
UIBC: 469 ug/dL

## 2021-05-24 LAB — COMPREHENSIVE METABOLIC PANEL
ALT: 27 U/L (ref 0–44)
AST: 33 U/L (ref 15–41)
Albumin: 3 g/dL — ABNORMAL LOW (ref 3.5–5.0)
Alkaline Phosphatase: 61 U/L (ref 38–126)
Anion gap: 11 (ref 5–15)
BUN: 29 mg/dL — ABNORMAL HIGH (ref 8–23)
CO2: 19 mmol/L — ABNORMAL LOW (ref 22–32)
Calcium: 8.8 mg/dL — ABNORMAL LOW (ref 8.9–10.3)
Chloride: 109 mmol/L (ref 98–111)
Creatinine, Ser: 1.59 mg/dL — ABNORMAL HIGH (ref 0.44–1.00)
GFR, Estimated: 32 mL/min — ABNORMAL LOW (ref >60)
Glucose, Bld: 115 mg/dL — ABNORMAL HIGH (ref 70–99)
Potassium: 4.3 mmol/L (ref 3.5–5.1)
Sodium: 139 mmol/L (ref 135–145)
Total Bilirubin: 0.8 mg/dL (ref 0.3–1.2)
Total Protein: 6.2 g/dL — ABNORMAL LOW (ref 6.5–8.1)

## 2021-05-24 LAB — PREPARE RBC (CROSSMATCH)

## 2021-05-24 LAB — VITAMIN B12: Vitamin B-12: 477 pg/mL (ref 180–914)

## 2021-05-24 LAB — POC OCCULT BLOOD, ED: Fecal Occult Bld: NEGATIVE

## 2021-05-24 LAB — RETICULOCYTES
Immature Retic Fract: 36.7 % — ABNORMAL HIGH (ref 2.3–15.9)
RBC.: 2.29 MIL/uL — ABNORMAL LOW (ref 3.87–5.11)
Retic Count, Absolute: 59.8 10*3/uL (ref 19.0–186.0)
Retic Ct Pct: 2.6 % (ref 0.4–3.1)

## 2021-05-24 LAB — PROTIME-INR
INR: 1.8 — ABNORMAL HIGH (ref 0.8–1.2)
Prothrombin Time: 21 seconds — ABNORMAL HIGH (ref 11.4–15.2)

## 2021-05-24 LAB — CBG MONITORING, ED
Glucose-Capillary: 115 mg/dL — ABNORMAL HIGH (ref 70–99)
Glucose-Capillary: 149 mg/dL — ABNORMAL HIGH (ref 70–99)

## 2021-05-24 LAB — FERRITIN: Ferritin: 4 ng/mL — ABNORMAL LOW (ref 11–307)

## 2021-05-24 MED ORDER — APIXABAN 5 MG PO TABS
5.0000 mg | ORAL_TABLET | Freq: Two times a day (BID) | ORAL | Status: DC
  Administered 2021-05-24: 5 mg via ORAL
  Filled 2021-05-24: qty 1

## 2021-05-24 MED ORDER — INSULIN ASPART 100 UNIT/ML IJ SOLN
0.0000 [IU] | Freq: Three times a day (TID) | INTRAMUSCULAR | Status: DC
  Administered 2021-05-25: 3 [IU] via SUBCUTANEOUS
  Administered 2021-05-25: 1 [IU] via SUBCUTANEOUS
  Administered 2021-05-25 – 2021-05-26 (×2): 2 [IU] via SUBCUTANEOUS
  Administered 2021-05-26: 3 [IU] via SUBCUTANEOUS
  Administered 2021-05-27 – 2021-05-28 (×5): 2 [IU] via SUBCUTANEOUS
  Administered 2021-05-29 (×2): 3 [IU] via SUBCUTANEOUS

## 2021-05-24 MED ORDER — SODIUM CHLORIDE 0.9 % IV SOLN
10.0000 mL/h | Freq: Once | INTRAVENOUS | Status: AC
  Administered 2021-05-24: 10 mL/h via INTRAVENOUS

## 2021-05-24 MED ORDER — SODIUM CHLORIDE 0.9 % IV SOLN
1.0000 g | INTRAVENOUS | Status: AC
  Administered 2021-05-24 – 2021-05-26 (×3): 1 g via INTRAVENOUS
  Filled 2021-05-24 (×3): qty 10

## 2021-05-24 MED ORDER — ACETAMINOPHEN 650 MG RE SUPP: 650.0000 mg | Freq: Four times a day (QID) | RECTAL | Status: DC | PRN

## 2021-05-24 MED ORDER — ATORVASTATIN CALCIUM 10 MG PO TABS
20.0000 mg | ORAL_TABLET | Freq: Every day | ORAL | Status: DC
  Administered 2021-05-24 – 2021-05-28 (×5): 20 mg via ORAL
  Filled 2021-05-24 (×5): qty 2

## 2021-05-24 MED ORDER — ACETAMINOPHEN 325 MG PO TABS
650.0000 mg | ORAL_TABLET | Freq: Four times a day (QID) | ORAL | Status: DC | PRN
  Administered 2021-05-26: 650 mg via ORAL
  Filled 2021-05-24: qty 2

## 2021-05-24 MED ORDER — PANTOPRAZOLE SODIUM 40 MG PO TBEC
40.0000 mg | DELAYED_RELEASE_TABLET | Freq: Two times a day (BID) | ORAL | Status: DC
  Administered 2021-05-24 – 2021-05-29 (×10): 40 mg via ORAL
  Filled 2021-05-24 (×10): qty 1

## 2021-05-24 MED ORDER — ONDANSETRON HCL 4 MG/2ML IJ SOLN
4.0000 mg | Freq: Four times a day (QID) | INTRAMUSCULAR | Status: DC | PRN
Start: 2021-05-24 — End: 2021-05-29

## 2021-05-24 MED ORDER — CEPHALEXIN 250 MG PO CAPS
500.0000 mg | ORAL_CAPSULE | Freq: Four times a day (QID) | ORAL | Status: DC
  Filled 2021-05-24: qty 2

## 2021-05-24 MED ORDER — ONDANSETRON HCL 4 MG PO TABS: 4.0000 mg | ORAL_TABLET | Freq: Four times a day (QID) | ORAL | Status: DC | PRN

## 2021-05-24 MED ORDER — INSULIN ASPART 100 UNIT/ML IJ SOLN: 0.0000 [IU] | Freq: Every day | INTRAMUSCULAR | Status: DC

## 2021-05-24 NOTE — H&P (Signed)
?History and Physical  ? ? ?[name] LHT:[id number] DOB: [birth year] DOA: 05/24/2021 ? ?DOS: the patient was seen and examined on 05/24/2021 ? ?PCP: Aretta Nip, MD  ? ?Patient coming from: Clinic ? ?I have personally briefly reviewed patient's old medical records in [place] ? ?CC: symptomatic anemia ?HPI: ?85 year old female history of type 2 diabetes, hypertension, morbid obesity, history of DVT/PE on chronic anticoagulation, history of aortic stenosis status post TAVR, history of complete heart block status post pacemaker, history of breast cancer, history of ovarian cancer presents to the ER today with symptomatic anemia.  Patient had blood work in the primary care office [facility]).  Hemoglobin was less than 5.  Patient transferred to the ER.  Repeat hemoglobin is 4.7. ? ?Patient states over the last 2 to 3 weeks, she has felt increasingly clumsy.  She states he has no energy.  She states she feels that she is "woozy in the head". ? ?Denies any recent falls.  Denies any hematochezia or melena.  Patient states that she takes oral iron. ? ?Patient denies any chest pain.  She has had some dyspnea on exertion. ? ?Vital signs on admission temp 90.4 heart rate 98 blood pressure 120/53 satting 100% on room air. ? ?Labs showed iron of 18, percent saturation of 4 TIBC of 487 ?Hemoccult was negative. ?Blood type B+. ? ?Sodium 139, bicarb 19, BUN of 29, creatinine 1.59 ? ?White count 8.7, hemoglobin 4.7, platelets 314 ? ?EKG showed V paced rhythm. ? ?2 units of blood were ordered for the patient. ? ?[facility] contacted for admission.  ? ?ED Course: Hemoglobin noted to be 4.7.  Hemoccult negative.  2 units of packed red blood cells started. ? ?Review of Systems:  ?Review of Systems  ?Constitutional:  Positive for malaise/fatigue. Negative for chills and fever.  ?Eyes: Negative.   ?Respiratory:  Positive for shortness of breath.   ?Cardiovascular:  Positive for leg swelling. Negative for chest pain.   ?Gastrointestinal:  Negative for abdominal pain, blood in stool, heartburn, melena, nausea and vomiting.  ?Genitourinary: Negative.   ?Musculoskeletal: Negative.   ?Skin:   ?     Chronic LE weeping wounds. Seen in wound care clinic and had new lymphedema wraps placed  ?Neurological:  Positive for dizziness.  ?Endo/Heme/Allergies: Negative.   ?Psychiatric/Behavioral: Negative.    ?All other systems reviewed and are negative. ? ?Past Medical History:  ?Diagnosis Date  ? Anxiety   ? Aortic stenosis   ? Arthritis   ? Breast cancer ([place])   ? CANCER  ? Diabetes mellitus   ? Type II  ? Dyspnea   ? Heart murmur   ? History of chemotherapy   ? History of kidney stones   ? Hypertension   ? Mitral stenosis   ? Morbid obesity ([place])   ? BMI > 40  ? Ovarian cancer ([place])   ? Perforated duodenal ulcer ([place]) 03/23/2018  ? Presence of permanent cardiac pacemaker   ? St. Jude  ? Pulmonary embolism ([place]) 2013  ? post op  ? 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  ? 26 mm Edwards Sapien 3 transcatheter heart valve placed via percutaneous right transfemoral approach   ? Spinal stenosis   ? ? ?Past Surgical History:  ?Procedure Laterality Date  ? ABDOMINAL HYSTERECTOMY  2013  ? BREAST SURGERY  03-2004  ? lumpectomy   ? CARDIAC CATHETERIZATION    ? INSERT / REPLACE / REMOVE PACEMAKER    ? INTRAOPERATIVE TRANSTHORACIC  ECHOCARDIOGRAM  07/17/2017  ? Procedure: INTRAOPERATIVE TRANSTHORACIC ECHOCARDIOGRAM;  Surgeon: Sherren Mocha, MD;  Location: [place];  Service: Open Heart Surgery;;  ? LAPAROTOMY N/A 03/22/2018  ? Procedure: grahams pouch, repair of bowel perforation;  Surgeon: Kinsinger, Arta Bruce, MD;  Location: [place];  Service: General;  Laterality: N/A;  ? LITHOTRIPSY    ? PACEMAKER IMPLANT N/A 04/21/2016  ? Procedure: Pacemaker Implant;  Surgeon: Will Meredith Leeds, MD;  Location: [place] [facility];  Service: Cardiovascular;  Laterality: N/A;  ? TONSILLECTOMY    ? TRANSCATHETER AORTIC VALVE REPLACEMENT, TRANSFEMORAL  N/A 07/17/2017  ? Procedure: TRANSCATHETER AORTIC VALVE REPLACEMENT, TRANSFEMORAL;  Surgeon: Sherren Mocha, MD;  Location: [place];  Service: Open Heart Surgery;  Laterality: N/A;  ? ? ? reports that she has quit smoking. Her smoking use included cigarettes. She has a 5.00 pack-year smoking history. She has never used smokeless tobacco. She reports that she does not drink alcohol and does not use drugs. ? ?Allergies  ?Allergen Reactions  ? Nsaids Other (See Comments)  ?  Peptic ulcer  ? Tape Rash  ?  CLEAR PLASTIC TAPE  ? ? ?Family History  ?Problem Relation Age of Onset  ? Bladder Cancer Father   ? Cancer Brother   ? Cancer Sister   ? Heart failure Mother   ? ? ?Prior to Admission medications   ?Medication Sig Start Date End Date Taking? Authorizing Provider  ?apixaban (ELIQUIS) 5 MG TABS tablet Take 1 tablet (5 mg total) by mouth 2 (two) times daily. 10/28/20   Camnitz, Ocie Doyne, MD  ?atorvastatin (LIPITOR) 20 MG tablet Take 20 mg by mouth at bedtime.     [provider]  ?Calcium Citrate-Vitamin D (CALCIUM CITRATE + PO) Take 1 tablet by mouth 2 (two) times daily.     [provider]  ?cholecalciferol (VITAMIN D) 1000 units tablet Take 1,000 Units by mouth 2 (two) times daily.     [provider]  ?clotrimazole (LOTRIMIN) 1 % cream Apply to scales on lower extremities once daily until resolved 05/03/21   Marzetta Board, DPM  ?ferrous sulfate 325 (65 FE) MG tablet 1 tablet    [provider]  ?fish oil-omega-3 fatty acids 1000 MG capsule Take 1 g by mouth every evening.     [provider]  ?FLUoxetine (PROZAC) 40 MG capsule Take 40 mg by mouth daily.     [provider]  ?furosemide (LASIX) 40 MG tablet TAKE 1 TABLET TWICE DAILY  ON MONDAY, WEDNESDAY,  FRIDAY, &amp; SUNDAY AND 1 TAB  ONCE DAILY ON TUESDAY,  THURSDAY,&amp; SATURDAY 03/22/21   Skeet Latch, MD  ?Ernest Mallick FLEXTOUCH 100 UNIT/ML Pen Inject 60-70 Units into the skin 2 (two) times daily. Per  sliding scale 03/20/16   [provider]  ?Magnesium 400 MG CAPS Take 400 mg by mouth at bedtime.     [provider]  ?metFORMIN (GLUCOPHAGE) 1000 MG tablet Take 1,000 mg by mouth 2 (two) times daily with a meal.     [provider]  ?Multiple Vitamin (MULTIVITAMIN) tablet Take 1 tablet by mouth daily.      [provider]  ?olmesartan (BENICAR) 5 MG tablet Take by mouth. 10/27/19   [provider]  ?pantoprazole (PROTONIX) 40 MG tablet Take 1 tablet (40 mg total) by mouth 2 (two) times daily. 07/18/19   Molpus, John, MD  ?pantoprazole (PROTONIX) 40 MG tablet Take by mouth. 11/24/19   [provider]  ?saccharomyces boulardii (  FLORASTOR) 250 MG capsule Take 1 capsule (250 mg total) by mouth 2 (two) times daily. You can find a probiotic over the counter. Take this while you are on antibiotics. 03/28/18   Meuth, Blaine Hamper, PA-C  ?[place] injection  04/26/20   [provider]  ? ? ?Physical Exam: ?Vitals:  ? 05/24/21 1930 05/24/21 1943 05/24/21 1944 05/24/21 1945  ?BP: (!) 122/51     ?Pulse: (!) 120 94 96 94  ?Resp: '16 17 17 16  '$ ?Temp:      ?TempSrc:      ?SpO2: 99% 100% 100% 98%  ? ? ?Physical Exam ?Vitals and nursing note reviewed.  ?Constitutional:   ?   General: She is not in acute distress. ?   Appearance: She is obese. She is not ill-appearing, toxic-appearing or diaphoretic.  ?HENT:  ?   Head: Normocephalic and atraumatic.  ?   Nose: Nose normal. No rhinorrhea.  ?Cardiovascular:  ?   Rate and Rhythm: Normal rate. Rhythm irregular.  ?   Heart sounds: Murmur heard.  ?Systolic murmur is present with a grade of 3/6.  ?Pulmonary:  ?   Effort: Pulmonary effort is normal. No respiratory distress.  ?   Breath sounds: No wheezing or rales.  ?Abdominal:  ?   General: Abdomen is protuberant. Bowel sounds are normal. There is no distension.  ?   Palpations: Abdomen is soft.  ?   Tenderness: There is no guarding or rebound.  ?Musculoskeletal:  ?   Right lower leg:  Edema present.  ?   Left lower leg: Edema present.  ?Skin: ?   General: Skin is warm and dry.  ?   Capillary Refill: Capillary refill takes less than 2 seconds.  ?   Coloration: Skin is pale.  ?   Comments:

## 2021-05-24 NOTE — Assessment & Plan Note (Signed)
Chronic. 

## 2021-05-24 NOTE — Assessment & Plan Note (Signed)
Patient currently has lymphedema wraps on both lower extremities.  She was seen in wound care clinic today. ?

## 2021-05-24 NOTE — ED Provider Notes (Signed)
?[facility] ?Provider Note ? ? ?CSN: [id number] ?Arrival date & time: 05/24/21  1653 ? ?  ? ?History ? ?Chief Complaint  ?Patient presents with  ? Abnormal Lab  ? ? ?[name] is a 86 y.o. female.  Presented to the emergency room with concern for abnormal lab.  Has not been feeling well over the last 3 to 4 days, general malaise, no energy, foggy headed.  Has not had any chest pain or difficulty in breathing.  She has not noticed any change in her skin color however her niece who is a nurse is concerned that she has more pale skin and possibly jaundiced.  She denies any blood in her stool black or tarry stools.  Went to her primary care office and was advised to go to ER due to low hemoglobin.  Also states that she was diagnosed with UTI and was started on cephalexin. ? ?HPI ? ?  ? ?Home Medications ?Prior to Admission medications   ?Medication Sig Start Date End Date Taking? Authorizing Provider  ?apixaban (ELIQUIS) 5 MG TABS tablet Take 1 tablet (5 mg total) by mouth 2 (two) times daily. 10/28/20   Camnitz, Ocie Doyne, MD  ?atorvastatin (LIPITOR) 20 MG tablet Take 20 mg by mouth at bedtime.     [provider]  ?Calcium Citrate-Vitamin D (CALCIUM CITRATE + PO) Take 1 tablet by mouth 2 (two) times daily.     [provider]  ?cholecalciferol (VITAMIN D) 1000 units tablet Take 1,000 Units by mouth 2 (two) times daily.     [provider]  ?clotrimazole (LOTRIMIN) 1 % cream Apply to scales on lower extremities once daily until resolved 05/03/21   Marzetta Board, DPM  ?ferrous sulfate 325 (65 FE) MG tablet 1 tablet    [provider]  ?fish oil-omega-3 fatty acids 1000 MG capsule Take 1 g by mouth every evening.     [provider]  ?FLUoxetine (PROZAC) 40 MG capsule Take 40 mg by mouth daily.     [provider]  ?furosemide (LASIX) 40 MG tablet TAKE 1 TABLET TWICE DAILY  ON MONDAY, WEDNESDAY,  FRIDAY, &amp; SUNDAY AND  1 TAB  ONCE DAILY ON TUESDAY,  THURSDAY,&amp; SATURDAY 03/22/21   Skeet Latch, MD  ?Ernest Mallick FLEXTOUCH 100 UNIT/ML Pen Inject 60-70 Units into the skin 2 (two) times daily. Per sliding scale 03/20/16   [provider]  ?Magnesium 400 MG CAPS Take 400 mg by mouth at bedtime.     [provider]  ?metFORMIN (GLUCOPHAGE) 1000 MG tablet Take 1,000 mg by mouth 2 (two) times daily with a meal.     [provider]  ?Multiple Vitamin (MULTIVITAMIN) tablet Take 1 tablet by mouth daily.      [provider]  ?olmesartan (BENICAR) 5 MG tablet Take by mouth. 10/27/19   [provider]  ?pantoprazole (PROTONIX) 40 MG tablet Take 1 tablet (40 mg total) by mouth 2 (two) times daily. 07/18/19   Molpus, John, MD  ?pantoprazole (PROTONIX) 40 MG tablet Take by mouth. 11/24/19   [provider]  ?saccharomyces boulardii (FLORASTOR) 250 MG capsule Take 1 capsule (250 mg total) by mouth 2 (two) times daily. You can find a probiotic over the counter. Take this while you are on antibiotics. 03/28/18   Meuth, Blaine Hamper, PA-C  ?[place] injection  04/26/20   [provider]  ?   ? ?Allergies    ?Nsaids and Tape   ? ?  Review of Systems   ?Review of Systems  ?Constitutional:  Positive for fatigue. Negative for chills and fever.  ?HENT:  Negative for ear pain and sore throat.   ?Eyes:  Negative for pain and visual disturbance.  ?Respiratory:  Negative for cough and shortness of breath.   ?Cardiovascular:  Negative for chest pain and palpitations.  ?Gastrointestinal:  Negative for abdominal pain and vomiting.  ?Genitourinary:  Negative for dysuria and hematuria.  ?Musculoskeletal:  Negative for arthralgias and back pain.  ?Skin:  Positive for color change. Negative for rash.  ?Neurological:  Positive for light-headedness. Negative for seizures and syncope.  ? ?Physical Exam ?Updated Vital Signs ?BP (!) 119/51   Pulse (!) 101   Temp 98.3 ?F (36.8 ?C) (Temporal)   Resp 17   SpO2 100%   ?Physical Exam ?Vitals and nursing note reviewed.  ?Constitutional:   ?   General: She is not in acute distress. ?   Appearance: She is well-developed.  ?HENT:  ?   Head: Normocephalic and atraumatic.  ?Eyes:  ?   Conjunctiva/sclera: Conjunctivae normal.  ?Cardiovascular:  ?   Rate and Rhythm: Normal rate and regular rhythm.  ?   Heart sounds: No murmur heard. ?Pulmonary:  ?   Effort: Pulmonary effort is normal. No respiratory distress.  ?   Breath sounds: Normal breath sounds.  ?Abdominal:  ?   Palpations: Abdomen is soft.  ?   Tenderness: There is no abdominal tenderness.  ?Genitourinary: ?   Comments: Stool brown ?Musculoskeletal:     ?   General: No swelling.  ?   Cervical back: Neck supple.  ?Skin: ?   General: Skin is warm and dry.  ?   Capillary Refill: Capillary refill takes less than 2 seconds.  ?Neurological:  ?   General: No focal deficit present.  ?   Mental Status: She is alert.  ?Psychiatric:     ?   Mood and Affect: Mood normal.  ? ? ?ED Results / Procedures / Treatments   ?Labs ?(all labs ordered are listed, but only abnormal results are displayed) ?Labs Reviewed  ?CBC WITH DIFFERENTIAL/PLATELET - Abnormal; Notable for the following components:  ?    Result Value  ? RBC 2.33 (*)   ? Hemoglobin 4.7 (*)   ? HCT 17.6 (*)   ? MCV 75.5 (*)   ? MCH 20.2 (*)   ? MCHC 26.7 (*)   ? RDW 20.2 (*)   ? nRBC 2.5 (*)   ? Monocytes Absolute 1.1 (*)   ? All other components within normal limits  ?COMPREHENSIVE METABOLIC PANEL - Abnormal; Notable for the following components:  ? CO2 19 (*)   ? Glucose, Bld 115 (*)   ? BUN 29 (*)   ? Creatinine, Ser 1.59 (*)   ? Calcium 8.8 (*)   ? Total Protein 6.2 (*)   ? Albumin 3.0 (*)   ? GFR, Estimated 32 (*)   ? All other components within normal limits  ?PROTIME-INR - Abnormal; Notable for the following components:  ? Prothrombin Time 21.0 (*)   ? INR 1.8 (*)   ? All other components within normal limits  ?RETICULOCYTES - Abnormal; Notable for the following components:  ? RBC.  2.29 (*)   ? Immature Retic Fract 36.7 (*)   ? All other components within normal limits  ?CBG MONITORING, ED - Abnormal; Notable for the following components:  ? Glucose-Capillary 115 (*)   ? All other components within normal limits  ?  URINALYSIS, ROUTINE W REFLEX MICROSCOPIC  ?VITAMIN B12  ?FOLATE  ?IRON AND TIBC  ?FERRITIN  ?POC OCCULT BLOOD, ED  ?TYPE AND SCREEN  ?PREPARE RBC (CROSSMATCH)  ? ? ?EKG ?None ? ?Radiology ?No results found. ? ?Procedures ?[name] KitchenCritical Care ?Performed by: Lucrezia Starch, MD ?Authorized by: Lucrezia Starch, MD  ? ?Critical care provider statement:  ?  Critical care time (minutes):  37 ?  Critical care was necessary to treat or prevent imminent or life-threatening deterioration of the following conditions:  Shock ?  Critical care was time spent personally by me on the following activities:  Development of treatment plan with patient or surrogate, discussions with consultants, evaluation of patient's response to treatment, examination of patient, ordering and review of laboratory studies, ordering and review of radiographic studies, ordering and performing treatments and interventions, pulse oximetry, re-evaluation of patient's condition and review of old charts  ? ? ?Medications Ordered in ED ?Medications  ?cephALEXin (KEFLEX) capsule 500 mg (has no administration in time range)  ?0.9 %  sodium chloride infusion (10 mL/hr Intravenous New Bag/Given 05/24/21 1902)  ? ? ?ED Course/ Medical Decision Making/ A&P ?  ?                        ?Medical Decision Making ?Amount and/or Complexity of Data Reviewed ?Labs: ordered. ? ?Risk ?Prescription drug management. ? ? ?86 year old lady presenting to ER with concern for low hemoglobin.  Has had some general fatigue, malaise, weakness over the past few days.  Hemoglobin repeated in ER and is profoundly low at 4.7.  Mild AKI.  Stool is brown, Hemoccult negative.  We will send anemia panel to further investigate etiology for her anemia today.   Will transfuse 2 units of blood for now.  Given severity of anemia, symptomatic anemia, will admit to hospital for further management. ? ?Patient, niece and sister updated throughout stay. ? ? ? ? ? ? ? ?F

## 2021-05-24 NOTE — ACP (Advance Care Planning) (Signed)
?  Advance Care Planning  ?Reason for Advance Care Planning Conversation: Acute hospitalization ?Principal Problem: ?  Symptomatic anemia ?Active Problems: ?  Acute cystitis ?  Stage 3a chronic kidney disease (CKD) (HCC) - baseline SCr 1.2 ?  Acute renal failure superimposed on stage 3a chronic kidney disease ([place]) ?  Morbid obesity ([place]) ?  Essential hypertension ?  S/P TAVR (transcatheter aortic valve replacement) ?  History of DVT (deep vein thrombosis) ?  Diabetes mellitus ([place]) ?  History of cardiac pacemaker - due to complete heart block ?  DNR (do not resuscitate)/DNI(Do Not Intubate) ?  Lymphedema of both lower extremities ?  ? ?I discussed with patient and sister and grand-niece [name]  about advance care planning. Specifically, we discussed whether patient would desire cardiopulmonary resuscitation (CPR) in the event of acute cardiopulmonary arrest. We also discussed whether endotracheal intubation and temporary ventilator life support would be desired in the event of acute cardio- or pulmonary decompensation.  ? ?Code status order of DNR has been entered in accord with the patient's wishes. no intubation ? ?Living will: yes ? ?Health Care Agent / Davonna Belling  ?            Name: [name]: Relationship to Patient: neice  ? Is agent appointed in legal document no ? ?Time spent today in ACP discussion was  5 mins ? ?Kristopher Oppenheim, DO ?[facility] ?  ? ?

## 2021-05-24 NOTE — Assessment & Plan Note (Signed)
Hold BP meds while hypotensive ?

## 2021-05-24 NOTE — Progress Notes (Signed)
BP has improved. But in afib. Sometimes rapid. Will order cardiac telemetry bed. Known afib. ?

## 2021-05-24 NOTE — Assessment & Plan Note (Signed)
Verified pt's DNR with patient. Pt's grand-niece [name] is a witness. ?

## 2021-05-24 NOTE — Assessment & Plan Note (Signed)
Pt diagnosed with UTI at PCP office([facility]). Do not have access to [facility] PCP records. Will Rx IV Rocephin. ?

## 2021-05-24 NOTE — Assessment & Plan Note (Signed)
On insulin. Add SSI. Hold metformin. ?

## 2021-05-24 NOTE — ED Provider Triage Note (Signed)
Emergency Medicine Provider Triage Evaluation Note ? ?[name] , a 86 y.o. female  was evaluated in triage.  Pt complains of abnormal lab. States that she was sent here by her PCP for hemoglobin 4.5. States that she has been feeling generally unwell for the past few weeks. Also states that she has a UTI and that both her legs are more swollen than normal. ? ?Review of Systems  ?Positive:  ?Negative: Fevers, chills, pain ? ?Physical Exam  ?BP (!) 120/53 (BP Location: Left Arm)   Pulse 98   Temp 98.4 ?F (36.9 ?C) (Oral)   Resp 20   SpO2 100%  ?Gen:   Awake, no distress   ?Resp:  Normal effort  ?MSK:   Moves extremities without difficulty  ?Other:  Patient pale, unwell appearing, legs are edematous ? ?Medical Decision Making  ?Medically screening exam initiated at 5:10 PM.  Appropriate orders placed.  [name] was informed that the remainder of the evaluation will be completed by another provider, this initial triage assessment does not replace that evaluation, and the importance of remaining in the ED until their evaluation is complete. ? ?Charge informed that patient needs next available room ?  ?Bud Face, PA-C ?05/24/21 1713 ? ?

## 2021-05-24 NOTE — ED Notes (Signed)
Notified Dr. Roslynn Amble of hemoglobin of 4.7.  ?

## 2021-05-24 NOTE — Assessment & Plan Note (Signed)
Likely due to severe anemia. PRBC should help. Hold nephrotoxic agents. Hold ARB and lasix. ?

## 2021-05-24 NOTE — Assessment & Plan Note (Addendum)
Admit to observation telemetry bed. Pt's SBP currently in the 80s but has PRBC running. Plan on holding patient in the ER until 2 units PRBC transfused and SBP improves, then will admit to telemetry floor bed. Discussed with EDP. Hemoccult negative. Has been many years since last colonscopy. Pt denies any hematochezia or melena. Given hx of severe AS and s/p TAVR, pt may have small bowel AVMs. Will consult [facility]. Pt will likely need outpatient EGD/Colonoscopy/capsule endoscopy. Discussed with pt, pt's sister and pt's grand-niece(grace who is an Therapist, sports) at bedside. ?

## 2021-05-24 NOTE — ED Triage Notes (Signed)
Pt arrived via POV. Pt states she was at PCP and was told her hemoglobin is 4.5, she has a UTI and her legs are swollen. Pt states, " I been foggy the last couple days." Pt states she has been slurring her words, she can't focus. Pt is pale. Pt states she has been slurring her words the last week or so. Pt has a slight left sided facial droop and slurred speech.  ?

## 2021-05-24 NOTE — Subjective & Objective (Signed)
CC: symptomatic anemia ?HPI: ?86 year old female history of type 2 diabetes, hypertension, morbid obesity, history of DVT/PE on chronic anticoagulation, history of aortic stenosis status post TAVR, history of complete heart block status post pacemaker, history of breast cancer, history of ovarian cancer presents to the ER today with symptomatic anemia.  Patient had blood work in the primary care office [facility]).  Hemoglobin was less than 5.  Patient transferred to the ER.  Repeat hemoglobin is 4.7. ? ?Patient states over the last 2 to 3 weeks, she has felt increasingly clumsy.  She states he has no energy.  She states she feels that she is "woozy in the head". ? ?Denies any recent falls.  Denies any hematochezia or melena.  Patient states that she takes oral iron. ? ?Patient denies any chest pain.  She has had some dyspnea on exertion. ? ?Vital signs on admission temp 90.4 heart rate 98 blood pressure 120/53 satting 100% on room air. ? ?Labs showed iron of 18, percent saturation of 4 TIBC of 487 ?Hemoccult was negative. ?Blood type B+. ? ?Sodium 139, bicarb 19, BUN of 29, creatinine 1.59 ? ?White count 8.7, hemoglobin 4.7, platelets 314 ? ?EKG showed V paced rhythm. ? ?2 units of blood were ordered for the patient. ? ?[facility] contacted for admission. ?

## 2021-05-24 NOTE — Assessment & Plan Note (Signed)
On Eliquis at home. Hemoccult negative in ER. ?

## 2021-05-25 DIAGNOSIS — Z7984 Long term (current) use of oral hypoglycemic drugs: Secondary | ICD-10-CM | POA: Diagnosis not present

## 2021-05-25 DIAGNOSIS — K645 Perianal venous thrombosis: Secondary | ICD-10-CM | POA: Diagnosis not present

## 2021-05-25 DIAGNOSIS — E1122 Type 2 diabetes mellitus with diabetic chronic kidney disease: Secondary | ICD-10-CM

## 2021-05-25 DIAGNOSIS — K2970 Gastritis, unspecified, without bleeding: Secondary | ICD-10-CM | POA: Diagnosis not present

## 2021-05-25 DIAGNOSIS — K219 Gastro-esophageal reflux disease without esophagitis: Secondary | ICD-10-CM | POA: Diagnosis present

## 2021-05-25 DIAGNOSIS — K2971 Gastritis, unspecified, with bleeding: Secondary | ICD-10-CM | POA: Diagnosis present

## 2021-05-25 DIAGNOSIS — L97819 Non-pressure chronic ulcer of other part of right lower leg with unspecified severity: Secondary | ICD-10-CM | POA: Diagnosis present

## 2021-05-25 DIAGNOSIS — Z87891 Personal history of nicotine dependence: Secondary | ICD-10-CM | POA: Diagnosis not present

## 2021-05-25 DIAGNOSIS — K5521 Angiodysplasia of colon with hemorrhage: Secondary | ICD-10-CM | POA: Diagnosis present

## 2021-05-25 DIAGNOSIS — D509 Iron deficiency anemia, unspecified: Secondary | ICD-10-CM | POA: Diagnosis present

## 2021-05-25 DIAGNOSIS — K922 Gastrointestinal hemorrhage, unspecified: Secondary | ICD-10-CM | POA: Diagnosis present

## 2021-05-25 DIAGNOSIS — K635 Polyp of colon: Secondary | ICD-10-CM | POA: Diagnosis present

## 2021-05-25 DIAGNOSIS — Z950 Presence of cardiac pacemaker: Secondary | ICD-10-CM | POA: Diagnosis not present

## 2021-05-25 DIAGNOSIS — E1165 Type 2 diabetes mellitus with hyperglycemia: Secondary | ICD-10-CM | POA: Diagnosis present

## 2021-05-25 DIAGNOSIS — N179 Acute kidney failure, unspecified: Secondary | ICD-10-CM | POA: Diagnosis present

## 2021-05-25 DIAGNOSIS — I129 Hypertensive chronic kidney disease with stage 1 through stage 4 chronic kidney disease, or unspecified chronic kidney disease: Secondary | ICD-10-CM | POA: Diagnosis present

## 2021-05-25 DIAGNOSIS — Z9221 Personal history of antineoplastic chemotherapy: Secondary | ICD-10-CM | POA: Diagnosis not present

## 2021-05-25 DIAGNOSIS — D62 Acute posthemorrhagic anemia: Secondary | ICD-10-CM | POA: Diagnosis present

## 2021-05-25 DIAGNOSIS — N1831 Chronic kidney disease, stage 3a: Secondary | ICD-10-CM | POA: Diagnosis present

## 2021-05-25 DIAGNOSIS — Z20822 Contact with and (suspected) exposure to covid-19: Secondary | ICD-10-CM | POA: Diagnosis present

## 2021-05-25 DIAGNOSIS — Z90710 Acquired absence of both cervix and uterus: Secondary | ICD-10-CM | POA: Diagnosis not present

## 2021-05-25 DIAGNOSIS — Z66 Do not resuscitate: Secondary | ICD-10-CM | POA: Diagnosis present

## 2021-05-25 DIAGNOSIS — D649 Anemia, unspecified: Secondary | ICD-10-CM | POA: Diagnosis not present

## 2021-05-25 DIAGNOSIS — Z953 Presence of xenogenic heart valve: Secondary | ICD-10-CM | POA: Diagnosis not present

## 2021-05-25 DIAGNOSIS — N3000 Acute cystitis without hematuria: Secondary | ICD-10-CM | POA: Diagnosis present

## 2021-05-25 DIAGNOSIS — E119 Type 2 diabetes mellitus without complications: Secondary | ICD-10-CM | POA: Diagnosis not present

## 2021-05-25 DIAGNOSIS — L97829 Non-pressure chronic ulcer of other part of left lower leg with unspecified severity: Secondary | ICD-10-CM | POA: Diagnosis present

## 2021-05-25 DIAGNOSIS — K641 Second degree hemorrhoids: Secondary | ICD-10-CM | POA: Diagnosis present

## 2021-05-25 LAB — URINALYSIS, ROUTINE W REFLEX MICROSCOPIC
Bilirubin Urine: NEGATIVE
Glucose, UA: NEGATIVE mg/dL
Ketones, ur: NEGATIVE mg/dL
Nitrite: NEGATIVE
Protein, ur: NEGATIVE mg/dL
Specific Gravity, Urine: 1.014 (ref 1.005–1.030)
WBC, UA: >50 WBC/hpf — ABNORMAL HIGH (ref 0–5)
pH: 5 (ref 5.0–8.0)

## 2021-05-25 LAB — COMPREHENSIVE METABOLIC PANEL
ALT: 22 U/L (ref 0–44)
AST: 42 U/L — ABNORMAL HIGH (ref 15–41)
Albumin: 2.7 g/dL — ABNORMAL LOW (ref 3.5–5.0)
Alkaline Phosphatase: 58 U/L (ref 38–126)
Anion gap: 8 (ref 5–15)
BUN: 24 mg/dL — ABNORMAL HIGH (ref 8–23)
CO2: 22 mmol/L (ref 22–32)
Calcium: 8.5 mg/dL — ABNORMAL LOW (ref 8.9–10.3)
Chloride: 107 mmol/L (ref 98–111)
Creatinine, Ser: 1.28 mg/dL — ABNORMAL HIGH (ref 0.44–1.00)
GFR, Estimated: 42 mL/min — ABNORMAL LOW (ref >60)
Glucose, Bld: 154 mg/dL — ABNORMAL HIGH (ref 70–99)
Potassium: 4.2 mmol/L (ref 3.5–5.1)
Sodium: 137 mmol/L (ref 135–145)
Total Bilirubin: 1.1 mg/dL (ref 0.3–1.2)
Total Protein: 5.5 g/dL — ABNORMAL LOW (ref 6.5–8.1)

## 2021-05-25 LAB — CBC WITH DIFFERENTIAL/PLATELET
Abs Immature Granulocytes: 0.03 10*3/uL (ref 0.00–0.07)
Basophils Absolute: 0.1 10*3/uL (ref 0.0–0.1)
Basophils Relative: 1 %
Eosinophils Absolute: 0.2 10*3/uL (ref 0.0–0.5)
Eosinophils Relative: 2 %
HCT: 24.1 % — ABNORMAL LOW (ref 36.0–46.0)
Hemoglobin: 7 g/dL — ABNORMAL LOW (ref 12.0–15.0)
Immature Granulocytes: 0 %
Lymphocytes Relative: 15 %
Lymphs Abs: 1.3 10*3/uL (ref 0.7–4.0)
MCH: 22.8 pg — ABNORMAL LOW (ref 26.0–34.0)
MCHC: 29 g/dL — ABNORMAL LOW (ref 30.0–36.0)
MCV: 78.5 fL — ABNORMAL LOW (ref 80.0–100.0)
Monocytes Absolute: 1.1 10*3/uL — ABNORMAL HIGH (ref 0.1–1.0)
Monocytes Relative: 13 %
Neutro Abs: 6 10*3/uL (ref 1.7–7.7)
Neutrophils Relative %: 69 %
Platelets: 280 10*3/uL (ref 150–400)
RBC: 3.07 MIL/uL — ABNORMAL LOW (ref 3.87–5.11)
RDW: 19.4 % — ABNORMAL HIGH (ref 11.5–15.5)
WBC: 8.6 10*3/uL (ref 4.0–10.5)
nRBC: 1.5 % — ABNORMAL HIGH (ref 0.0–0.2)

## 2021-05-25 LAB — MAGNESIUM: Magnesium: 1.6 mg/dL — ABNORMAL LOW (ref 1.7–2.4)

## 2021-05-25 LAB — GLUCOSE, CAPILLARY
Glucose-Capillary: 190 mg/dL — ABNORMAL HIGH (ref 70–99)
Glucose-Capillary: 155 mg/dL — ABNORMAL HIGH (ref 70–99)
Glucose-Capillary: 123 mg/dL — ABNORMAL HIGH (ref 70–99)
Glucose-Capillary: 138 mg/dL — ABNORMAL HIGH (ref 70–99)

## 2021-05-25 LAB — HEMOGLOBIN A1C
Hgb A1c MFr Bld: 5.5 % (ref 4.8–5.6)
Mean Plasma Glucose: 111.15 mg/dL

## 2021-05-25 LAB — PREPARE RBC (CROSSMATCH)

## 2021-05-25 LAB — CBG MONITORING, ED: Glucose-Capillary: 148 mg/dL — ABNORMAL HIGH (ref 70–99)

## 2021-05-25 LAB — HEMOGLOBIN AND HEMATOCRIT, BLOOD
HCT: 21.7 % — ABNORMAL LOW (ref 36.0–46.0)
Hemoglobin: 6.2 g/dL — CL (ref 12.0–15.0)

## 2021-05-25 MED ORDER — SODIUM CHLORIDE 0.9% IV SOLUTION: Freq: Once | INTRAVENOUS | Status: DC

## 2021-05-25 MED ORDER — FUROSEMIDE 10 MG/ML IJ SOLN
20.0000 mg | Freq: Once | INTRAMUSCULAR | Status: AC
  Administered 2021-05-25: 20 mg via INTRAVENOUS
  Filled 2021-05-25: qty 2

## 2021-05-25 MED ORDER — SODIUM CHLORIDE 0.9 % IV SOLN: INTRAVENOUS | Status: DC

## 2021-05-25 MED ORDER — MAGNESIUM SULFATE 2 GM/50ML IV SOLN
2.0000 g | Freq: Once | INTRAVENOUS | Status: AC
  Administered 2021-05-25: 2 g via INTRAVENOUS
  Filled 2021-05-25: qty 50

## 2021-05-25 NOTE — Anesthesia Preprocedure Evaluation (Addendum)
Anesthesia Evaluation  ?Patient identified by MRN, date of birth, ID band ?Patient awake ? ? ? ?Reviewed: ?Allergy & Precautions, NPO status , Patient's Chart, lab work & pertinent test results ? ?Airway ?Mallampati: II ? ?TM Distance: >3 FB ?Neck ROM: Full ? ? ? Dental ? ?(+) Upper Dentures ?  ?Pulmonary ?former smoker, PE ?  ?Pulmonary exam normal ? ? ? ? ? ? ? Cardiovascular ?hypertension, Pt. on medications ?+ pacemaker + Valvular Problems/Murmurs (s/p TAVR 2019) AS  ?Rhythm:Regular Rate:Normal ? ? ?  ?Neuro/Psych ?Anxiety negative neurological ROS ?   ? GI/Hepatic ?Neg liver ROS, PUD, GERD  Medicated,  ?Endo/Other  ?diabetes, Type 2, Oral Hypoglycemic Agents ? Renal/GU ?  ? ?  ?Musculoskeletal ? ?(+) Arthritis , Osteoarthritis,   ? Abdominal ?Normal abdominal exam  (+)   ?Peds ? Hematology ? ?(+) Blood dyscrasia, anemia ,   ?Anesthesia Other Findings ? ? Reproductive/Obstetrics ? ?  ? ? ? ? ? ? ? ? ? ? ? ? ? ?  ?  ? ? ? ? ? ? ?Anesthesia Physical ?Anesthesia Plan ? ?ASA: 3 ? ?Anesthesia Plan: MAC  ? ?Post-op Pain Management:   ? ?Induction: Intravenous ? ?PONV Risk Score and Plan: 2 and Propofol infusion and Treatment may vary due to age or medical condition ? ?Airway Management Planned: Simple Face Mask, Natural Airway and Nasal Cannula ? ?Additional Equipment: None ? ?Intra-op Plan:  ? ?Post-operative Plan:  ? ?Informed Consent: I have reviewed the patients History and Physical, chart, labs and discussed the procedure including the risks, benefits and alternatives for the proposed anesthesia with the patient or authorized representative who has indicated his/her understanding and acceptance.  ? ? ? ?Dental advisory given ? ?Plan Discussed with: CRNA ? ?Anesthesia Plan Comments: (Lab Results ?     Component                Value               Date                 ?     WBC                      8.6                 05/25/2021           ?     HGB                      7.0 (L)              05/25/2021           ?     HCT                      24.1 (L)            05/25/2021           ?     MCV                      78.5 (L)            05/25/2021           ?     PLT                      280  05/25/2021           ?Lab Results ?     Component                Value               Date                 ?     NA                       137                 05/25/2021           ?     K                        4.2                 05/25/2021           ?     CO2                      22                  05/25/2021           ?     GLUCOSE                  154 (H)             05/25/2021           ?     BUN                      24 (H)              05/25/2021           ?     CREATININE               1.28 (H)            05/25/2021           ?     CALCIUM                  8.5 (L)             05/25/2021           ?     EGFR                     45 (L)              11/22/2020           ?     GFRNONAA                 42 (L)              05/25/2021           ?ECHO: ??1. Left ventricular ejection fraction, by estimation, is 60 to 65%. The  ?left ventricle has normal function. The left ventricle has no regional  ?wall motion abnormalities. There is moderate concentric left ventricular  ?hypertrophy. Left ventricular  ?diastolic parameters are consistent with Grade I diastolic dysfunction  ?(impaired relaxation). Elevated left atrial pressure.  ??2. Right ventricular systolic function is normal. The right ventricular  ?size is normal. There  is normal pulmonary artery systolic pressure.  ??3. Left atrial size was moderately dilated.  ??4. Right atrial size was mildly dilated.  ??5. The mitral valve is degenerative. Mild mitral valve regurgitation.  ?Mild to moderate mitral stenosis. The mean mitral valve gradient is 7.2  ?mmHg. Severe mitral annular calcification.  ??6. The aortic valve has been repaired/replaced. Aortic valve  ?regurgitation is not visualized. Mild aortic valve stenosis. There is a 26  ?mm Sapien  prosthetic (TAVR) valve present in the aortic position.  ?Procedure Date: 07/17/2017. Aortic valve mean  ?gradient measures 17.5 mmHg. Aortic valve Vmax measures 2.91 m/s.  ??7. The inferior vena cava is normal in size with greater than 50%  ?respiratory variability, suggesting right atrial pressure of 3 mmHg.  ? ?Comparison(s): No significant change from prior study. Prior images  ?reviewed side by side. )  ? ? ? ? ? ?Anesthesia Quick Evaluation ? ?

## 2021-05-25 NOTE — Progress Notes (Signed)
? ?  Repeat HgB after 2 units of PRBC is 6.2 g/dl. Will order 3rd unit of PRBC. Give 20 mg IV lasix x 1 ? ?Kristopher Oppenheim, DO ?[facility] ? ?

## 2021-05-25 NOTE — Consult Note (Signed)
Referring Provider: Flora Lipps, MD ?Primary Care Physician:  Aretta Nip, MD ?Primary Gastroenterologist:  [facility] ? ?Reason for Consultation:  Symptomatic anemia ? ?HPI: [name] is a 86 y.o. female  with history of type 2 diabetes, hypertension, morbid obesity, DVT/PE on chronic anticoagulation, aortic stenosis status post TAVR, complete heart block status post pacemaker, breast cancer, ovarian cancer, who presents to the ER 05/24/2021 with symptomatic anemia.  Patient had blood work in the primary care office [facility]).  Hemoglobin was less than 5.  Patient transferred to the ER.  Repeat hemoglobin was 4.7. Hemoccult negative. Patient has received 2 units PRBCs and is currently receiving a third unit. ? ?Patient states she began having confusion about 2 weeks ago. Also reporting some shortness of breath, but otherwise denies symptoms.  Saw PCP, was found to have low hemoglobin and sent to ER. Denies nausea, vomiting, abdominal pain, dysphagia.  She does report history of peptic ulcer, states that it perforated, however denies prior EGD.  Patient is on Eliquis for history of DVT, last dose yesterday, not sure what time.  Patient has remote smoking history of 2 years, quit 60 years ago.  Denies alcohol use, denies NSAIDs. ? ?Patient states bowel habits are regular.  She has a bowel movement every other day and denies hematochezia or melena.  She has positive family history of colon cancer in her brother, age 36s, but is up-to-date on her colon cancer screening.  Denies abdominal pain. ? ?She has lower extremity edema and wounds which she says are due to her diabetes, and these wounds have recently been weeping. Currently bandaged. ? ?Last colonoscopy 01/26/2016 with Dr. [name] at [facility].  Personal history of colon polyps.  Findings included diverticulosis of the sigmoid colon, nonbleeding internal hemorrhoids, medium sized lipoma in the cecum.  Biopsies benign, no repeat recommended due to  age. ? ?No record of prior EGD.  ? ?Eliquis being held. ? ?Past Medical History:  ?Diagnosis Date  ? Anxiety   ? Aortic stenosis   ? Arthritis   ? Breast cancer ([place])   ? CANCER  ? Diabetes mellitus   ? Type II  ? Dyspnea   ? Heart murmur   ? History of chemotherapy   ? History of kidney stones   ? Hypertension   ? Mitral stenosis   ? Morbid obesity ([place])   ? BMI > 40  ? Ovarian cancer ([place])   ? Perforated duodenal ulcer ([place]) 03/23/2018  ? Presence of permanent cardiac pacemaker   ? St. Jude  ? Pulmonary embolism ([place]) 2013  ? post op  ? 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  ? 26 mm Edwards Sapien 3 transcatheter heart valve placed via percutaneous right transfemoral approach   ? Spinal stenosis   ? ? ?Past Surgical History:  ?Procedure Laterality Date  ? ABDOMINAL HYSTERECTOMY  2013  ? BREAST SURGERY  03-2004  ? lumpectomy   ? CARDIAC CATHETERIZATION    ? INSERT / REPLACE / REMOVE PACEMAKER    ? INTRAOPERATIVE TRANSTHORACIC ECHOCARDIOGRAM  07/17/2017  ? Procedure: INTRAOPERATIVE TRANSTHORACIC ECHOCARDIOGRAM;  Surgeon: Sherren Mocha, MD;  Location: [facility];  Service: Open Heart Surgery;;  ? LAPAROTOMY N/A 03/22/2018  ? Procedure: grahams pouch, repair of bowel perforation;  Surgeon: Kinsinger, Arta Bruce, MD;  Location: [facility];  Service: General;  Laterality: N/A;  ? LITHOTRIPSY    ? PACEMAKER IMPLANT N/A 04/21/2016  ? Procedure: Pacemaker Implant;  Surgeon: Will Meredith Leeds, MD;  Location: [place] [facility];  Service: Cardiovascular;  Laterality: N/A;  ? TONSILLECTOMY    ? TRANSCATHETER AORTIC VALVE REPLACEMENT, TRANSFEMORAL N/A 07/17/2017  ? Procedure: TRANSCATHETER AORTIC VALVE REPLACEMENT, TRANSFEMORAL;  Surgeon: Sherren Mocha, MD;  Location: [facility];  Service: Open Heart Surgery;  Laterality: N/A;  ? ? ?Prior to Admission medications   ?Medication Sig Start Date End Date Taking? Authorizing Provider  ?acetaminophen (TYLENOL) 650 MG CR tablet Take 1,300 mg by mouth in the morning and at  bedtime.   Yes [provider]  ?apixaban (ELIQUIS) 5 MG TABS tablet Take 1 tablet (5 mg total) by mouth 2 (two) times daily. 10/28/20  Yes Camnitz, Ocie Doyne, MD  ?atorvastatin (LIPITOR) 20 MG tablet Take 20 mg by mouth at bedtime.    Yes [provider]  ?b complex vitamins capsule Take 1 capsule by mouth daily.   Yes [provider]  ?Calcium Citrate-Vitamin D (CALCIUM CITRATE + PO) Take 1 tablet by mouth 2 (two) times daily.    Yes [provider]  ?cholecalciferol (VITAMIN D) 1000 units tablet Take 1,000 Units by mouth 2 (two) times daily.    Yes [provider]  ?ferrous sulfate 325 (65 FE) MG tablet Take 325 mg by mouth every evening.   Yes [provider]  ?fish oil-omega-3 fatty acids 1000 MG capsule Take 1 g by mouth every evening.    Yes [provider]  ?FLUoxetine (PROZAC) 40 MG capsule Take 40 mg by mouth daily.    Yes [provider]  ?furosemide (LASIX) 40 MG tablet TAKE 1 TABLET TWICE DAILY  ON MONDAY, WEDNESDAY,  FRIDAY, &amp; SUNDAY AND 1 TAB  ONCE DAILY ON TUESDAY,  [place]; SATURDAY ?Patient taking differently: Take 40-80 mg by mouth See admin instructions. TAKE 1 TABLET TWICE DAILY  ON MONDAY, WEDNESDAY,  FRIDAY, & SUNDAY AND 1 TAB  ONCE DAILY ON TUESDAY,  THURSDAY,& SATURDAY 03/22/21  Yes Skeet Latch, MD  ?LEVEMIR FLEXTOUCH 100 UNIT/ML Pen Inject 60-70 Units into the skin 2 (two) times daily. Per sliding scale 03/20/16  Yes [provider]  ?Magnesium 400 MG CAPS Take 400 mg by mouth at bedtime.    Yes [provider]  ?metFORMIN (GLUCOPHAGE) 1000 MG tablet Take 1,000 mg by mouth 2 (two) times daily with a meal.    Yes [provider]  ?Multiple Vitamin (MULTIVITAMIN) tablet Take 1 tablet by mouth daily.     Yes [provider]  ?olmesartan (BENICAR) 5 MG tablet Take 5 mg by mouth daily. 10/27/19  Yes [provider]  ?pantoprazole (PROTONIX) 40 MG tablet Take 1 tablet  (40 mg total) by mouth 2 (two) times daily. 07/18/19  Yes Molpus, John, MD  ?pantoprazole (PROTONIX) 40 MG tablet Take 40 mg by mouth 2 (two) times daily. 11/24/19  Yes [provider]  ?saccharomyces boulardii (FLORASTOR) 250 MG capsule Take 1 capsule (250 mg total) by mouth 2 (two) times daily. You can find a probiotic over the counter. Take this while you are on antibiotics. 03/28/18  Yes Meuth, Brooke A, PA-C  ?clotrimazole (LOTRIMIN) 1 % cream Apply to scales on lower extremities once daily until resolved 05/03/21   Marzetta Board, DPM  ?[facility] injection  04/26/20   [provider]  ? ? ?Scheduled Meds: ? sodium chloride   Intravenous Once  ? atorvastatin  20 mg Oral QHS  ? insulin aspart  0-15 Units Subcutaneous TID WC  ? insulin aspart  0-5 Units Subcutaneous  QHS  ? pantoprazole  40 mg Oral BID  ? ?Continuous Infusions: ? cefTRIAXone (ROCEPHIN)  IV Stopped (05/24/21 2242)  ? magnesium sulfate bolus IVPB    ? ?PRN Meds:.acetaminophen **OR** acetaminophen, ondansetron **OR** ondansetron (ZOFRAN) IV ? ?Allergies as of 05/24/2021 - Review Complete 05/24/2021  ?Allergen Reaction Noted  ? Nsaids Other (See Comments) 03/26/2018  ? Tape Rash 07/16/2017  ? ? ?Family History  ?Problem Relation Age of Onset  ? Bladder Cancer Father   ? Cancer Brother   ? Cancer Sister   ? Heart failure Mother   ? ? ?Social History  ? ?Socioeconomic History  ? Marital status: Widowed  ?  Spouse name: Not on file  ? Number of children: Not on file  ? Years of education: Not on file  ? Highest education level: High school graduate  ?Occupational History  ? Not on file  ?Tobacco Use  ? Smoking status: Former  ?  Packs/day: 1.00  ?  Years: 5.00  ?  Pack years: 5.00  ?  Types: Cigarettes  ? Smokeless tobacco: Never  ?Vaping Use  ? Vaping Use: Never used  ?Substance and Sexual Activity  ? Alcohol use: No  ? Drug use: No  ? Sexual activity: Not on file  ?Other Topics Concern  ? Not on file  ?Social History Narrative  ? Not  on file  ? ?Social Determinants of Health  ? ?Financial Resource Strain: Not on file  ?Food Insecurity: Not on file  ?Transportation Needs: Not on file  ?Physical Activity: Not on file  ?Stress: Not on file  ?S

## 2021-05-25 NOTE — Progress Notes (Signed)
EVVA, DIN ([id number]) ?Visit Report for 05/20/2021 ?Abuse Risk Screen Details ?Patient Name: Date of Service: ?[name], [name] 05/20/2021 9:00 A M ?Medical Record Number: [id number] ?Patient Account Number: [id number] ?Date of Birth/Sex: Treating RN: ?1935-05-07 (86 y.o. Female) [name] ?Primary Care [name]: [name] Other Clinician: ?Referring [name]: ?Treating [name]/Extender: [name] ?[place], [place] ?Weeks in Treatment: 0 ?Abuse Risk Screen Items ?Answer ?ABUSE RISK SCREEN: ?Has anyone close to you tried to hurt or harm you recentlyo No ?Do you feel uncomfortable with anyone in your familyo No ?Has anyone forced you do things that you didnt want to doo No ?Electronic Signature(s) ?Signed: 05/24/2021 9:27:34 AM By: [name] ?Signed: 05/25/2021 4:21:04 PM By: Emily Creamer RN, BSN ?Entered By: [name] on 05/20/2021 09:03:22 ?-------------------------------------------------------------------------------- ?Activities of Daily Living Details ?Patient Name: Date of Service: ?[name], [name] 05/20/2021 9:00 A M ?Medical Record Number: [id number] ?Patient Account Number: [id number] ?Date of Birth/Sex: Treating RN: ?16-Aug-1935 (86 y.o. Female) [name] ?Primary Care [name]: [name] Other Clinician: ?Referring [name]: ?Treating [name]/Extender: [name] ?[place], [place] ?Weeks in Treatment: 0 ?Activities of Daily Living Items ?Answer ?Activities of Daily Living (Please select one for each item) ?Drive Automobile Not Able ?T Medications ?ake Completely Able ?Use T elephone Completely Able ?Care for Appearance Need Assistance ?Use T oilet Completely Able ?Bath / Shower Need Assistance ?Dress Self Completely Able ?Feed Self Completely Able ?Walk Need Assistance ?Get In / Out Bed Need Assistance ?Housework Need Assistance ?Prepare Meals Completely Able ?Handle Money Completely Able ?Shop for Self Need Assistance ?Electronic  Signature(s) ?Signed: 05/24/2021 9:27:34 AM By: [name] ?Signed: 05/25/2021 4:21:04 PM By: Emily Creamer RN, BSN ?Entered By: [name] on 05/20/2021 09:04:21 ?-------------------------------------------------------------------------------- ?Education Screening Details ?Patient Name: ?Date of Service: ?[name], [name] 05/20/2021 9:00 A M ?Medical Record Number: [id number] ?Patient Account Number: [id number] ?Date of Birth/Sex: ?Treating RN: ?[birth year] (86 y.o. Female) [name] ?Primary Care Laterrance Nauta: [name] R ?Other Clinician: ?Referring [name]: ?Treating [name]/Extender: [name] ?[place], [place] ?Weeks in Treatment: 0 ?Learning Preferences/Education Level/Primary Language ?Learning Preference: Explanation, Demonstration, Printed Material ?Highest Education Level: High School ?Preferred Language: English ?Cognitive Barrier ?Language Barrier: No ?Translator Needed: No ?Memory Deficit: No ?Emotional Barrier: No ?Cultural/Religious Beliefs Affecting Medical Care: No ?Physical Barrier ?Impaired Vision: No ?Impaired Hearing: No ?Decreased Hand dexterity: No ?Knowledge/Comprehension ?Knowledge Level: High ?Comprehension Level: High ?Ability to understand written instructions: High ?Ability to understand verbal instructions: High ?Motivation ?Anxiety Level: Calm ?Cooperation: Cooperative ?Education Importance: Acknowledges Need ?Interest in Health Problems: Asks Questions ?Perception: Coherent ?Willingness to Engage in Self-Management High ?Activities: ?Readiness to Engage in Self-Management High ?Activities: ?Electronic Signature(s) ?Signed: 05/24/2021 9:27:34 AM By: [name] ?Signed: 05/25/2021 4:21:04 PM By: Emily Creamer RN, BSN ?Entered By: [name] on 05/20/2021 09:05:20 ?-------------------------------------------------------------------------------- ?Fall Risk Assessment Details ?Patient Name: ?Date of Service: ?[name], [name] 05/20/2021 9:00 A M ?Medical  Record Number: [id number] ?Patient Account Number: [id number] ?Date of Birth/Sex: ?Treating RN: ?1935-10-08 (86 y.o. Female) [name] ?Primary Care [name]: [name] R ?Other Clinician: ?Referring [name]: ?Treating [name]/Extender: [name] ?[place], [place] ?Weeks in Treatment: 0 ?Fall Risk Assessment Items ?Have you had 2 or more falls in the last 12 monthso 0 No ?Have you had any fall that resulted in injury in the last 12 monthso 0 No ?FALLS RISK SCREEN ?History of falling - immediate or within 3 months 0 No ?Secondary diagnosis (Do you have 2 or more medical  diagnoseso) 15 Yes ?Ambulatory aid ?None/bed rest/wheelchair/[name] 0 No ?Crutches/cane/walker 15 Yes ?Furniture 0 No ?Intravenous therapy Access/Saline/Heparin Lock 0 No ?Gait/Transferring ?Normal/ bed rest/ wheelchair 0 No ?Weak (short steps with or without shuffle, stooped but able to lift head while walking, may seek 10 Yes ?support from furniture) ?Impaired (short steps with shuffle, may have difficulty arising from chair, head down, impaired 0 No ?balance) ?Mental Status ?Oriented to own ability 0 Yes ?Electronic Signature(s) ?Signed: 05/24/2021 9:27:34 AM By: [name] ?Signed: 05/25/2021 4:21:04 PM By: Emily Creamer RN, BSN ?Entered By: [name] on 05/20/2021 09:06:25 ?-------------------------------------------------------------------------------- ?Foot Assessment Details ?Patient Name: ?Date of Service: ?[name], [name] 05/20/2021 9:00 A M ?Medical Record Number: [id number] ?Patient Account Number: [id number] ?Date of Birth/Sex: ?Treating RN: ?1936/02/11 (86 y.o. Female) [name] ?Primary Care [name]: [name] R ?Other Clinician: ?Referring Matalynn Graff: ?Treating [name]/Extender: [name] ?[place], [place] ?Weeks in Treatment: 0 ?Foot Assessment Items ?Site Locations ?+ = Sensation present, - = Sensation absent, C = Callus, U = Ulcer ?R = Redness, W = Warmth, M = Maceration, PU =  Pre-ulcerative lesion ?F = Fissure, S = Swelling, D = Dryness ?Assessment ?Right: Left: ?Other Deformity: No No ?Prior Foot Ulcer: No No ?Prior Amputation: No No ?Charcot Joint: No No ?Ambulatory Status: ?Gait: ?Electronic Signature(s) ?Signed: 05/24/2021 9:27:34 AM By: [name] ?Signed: 05/25/2021 4:21:04 PM By: Emily Creamer RN, BSN ?Entered By: [name] on 05/20/2021 09:09:43 ?-------------------------------------------------------------------------------- ?Nutrition Risk Screening Details ?Patient Name: ?Date of Service: ?[name], [name] 05/20/2021 9:00 A M ?Medical Record Number: [id number] ?Patient Account Number: [id number] ?Date of Birth/Sex: ?Treating RN: ?[birth year] (87 y.o. Female) [name] ?Primary Care Kennet Mccort: [name] R ?Other Clinician: ?Referring Irish Breisch: ?Treating Kamoria Lucien/Extender: [name] ?[place], [place] ?Weeks in Treatment: 0 ?Height (in): 67 ?Weight (lbs): 260 ?Body Mass Index (BMI): 40.7 ?Nutrition Risk Screening Items ?Score Screening ?NUTRITION RISK SCREEN: ?I have an illness or condition that made me change the kind and/or amount of food I eat 0 No ?I eat fewer than two meals per day 0 No ?I eat few fruits and vegetables, or milk products 0 No ?I have three or more drinks of beer, liquor or wine almost every day 0 No ?I have tooth or mouth problems that make it hard for me to eat 0 No ?I don't always have enough money to buy the food I need 0 No ?I eat alone most of the time 0 No ?I take three or more different prescribed or over-the-counter drugs a day 1 Yes ?Without wanting to, I have lost or gained 10 pounds in the last six months 0 No ?I am not always physically able to shop, cook and/or feed myself 2 Yes ?Nutrition Protocols ?Good Risk Protocol ?Moderate Risk Protocol ?High Risk Proctocol ?Risk Level: Moderate Risk ?Score: 3 ?Electronic Signature(s) ?Signed: 05/24/2021 9:27:34 AM By: [name] ?Signed: 05/25/2021 4:21:04 PM By: Emily Creamer RN, BSN ?Entered By: [name] on 05/20/2021 09:07:54 ?

## 2021-05-25 NOTE — Progress Notes (Deleted)
?Cardiology Clinic Note  ? ?Patient Name: [name] ?Date of Encounter: 05/25/2021 ? ?Primary Care Provider:  Aretta Nip, MD ?Primary Cardiologist:  Margaretann Loveless  ? ?Patient Profile  ?  ?86 year old female with history of hypertension, diabetes, prior DVT/PE with plan for indefinite anticoagulation, severe aortic stenosis status post TAVR, complete heart block status post pacemaker (St. Jude) followed by Dr. Curt Bears , breast cancer status postlumpectomy, and morbid obesity. ? ?She had a repeat echocardiogram 05/2017 that revealed LVEF 55-60% and grade 1 diastolic dysfunction.  She was found to have critical aortic stenosis with a mean gradient of 62 mmHg.  She was referred to Dr. Burt Knack and underwent implantation of a 2m Edwards Sapien 3 TAVR bioprosthesis 07/17/17.  Postoperative echo revealed normal systolic function and a mean valve gradient of 15 mmHg ? ?Past Medical History  ?  ?Past Medical History:  ?Diagnosis Date  ? Anxiety   ? Aortic stenosis   ? Arthritis   ? Breast cancer ([place]   ? CANCER  ? Diabetes mellitus   ? Type II  ? Dyspnea   ? Heart murmur   ? History of chemotherapy   ? History of kidney stones   ? Hypertension   ? Mitral stenosis   ? Morbid obesity ([place]   ? BMI > 40  ? Ovarian cancer ([place]   ? Perforated duodenal ulcer ([place] 03/23/2018  ? Presence of permanent cardiac pacemaker   ? St. Jude  ? Pulmonary embolism ([place] 2013  ? post op  ? 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  ? 26 mm Edwards Sapien 3 transcatheter heart valve placed via percutaneous right transfemoral approach   ? Spinal stenosis   ? ?Past Surgical History:  ?Procedure Laterality Date  ? ABDOMINAL HYSTERECTOMY  2013  ? BREAST SURGERY  03-2004  ? lumpectomy   ? CARDIAC CATHETERIZATION    ? INSERT / REPLACE / REMOVE PACEMAKER    ? INTRAOPERATIVE TRANSTHORACIC ECHOCARDIOGRAM  07/17/2017  ? Procedure: INTRAOPERATIVE TRANSTHORACIC ECHOCARDIOGRAM;  Surgeon: CSherren Mocha MD;  [facility]  Service:  Open Heart Surgery;;  ? LAPAROTOMY N/A 03/22/2018  ? Procedure: grahams pouch, repair of bowel perforation;  Surgeon: Kinsinger, LArta Bruce MD;  [facility]  Service: General;  Laterality: N/A;  ? LITHOTRIPSY    ? PACEMAKER IMPLANT N/A 04/21/2016  ? Procedure: Pacemaker Implant;  Surgeon: Will MMeredith Leeds MD;  [facility];  Service: Cardiovascular;  Laterality: N/A;  ? TONSILLECTOMY    ? TRANSCATHETER AORTIC VALVE REPLACEMENT, TRANSFEMORAL N/A 07/17/2017  ? Procedure: TRANSCATHETER AORTIC VALVE REPLACEMENT, TRANSFEMORAL;  Surgeon: CSherren Mocha MD;  [facility]  Service: Open Heart Surgery;  Laterality: N/A;  ? ? ?Allergies ? ?Allergies  ?Allergen Reactions  ? Nsaids Other (See Comments)  ?  Peptic ulcer  ? Tape Rash  ?  CLEAR PLASTIC TAPE  ? ? ?History of Present Illness  ?  ?Mrs. MGhoshis a 86year old female patient followed by Dr.Acharya for above-mentioned history to include aortic valve stenosis, status post TAVR, hypertension, and hyperlipidemia, followed by Dr. CCurt BearsSt.Jude permanent pacemaker in the setting of complete heart block.  At the time of the last visit the patient was doing well with well-controlled blood pressure.  No changes were made in her current regimen. ? ?Home Medications  ?  ?No current facility-administered medications for this visit.  ? ?No current outpatient medications on file.  ? ?Facility-Administered Medications Ordered in Other Visits  ?Medication  Dose Route Frequency Provider Last Rate Last Admin  ? 0.9 %  sodium chloride infusion (Manually program via Guardrails IV Fluids)   Intravenous Once Kristopher Oppenheim, DO   Held at 05/25/21 0301  ? acetaminophen (TYLENOL) tablet 650 mg  650 mg Oral Q6H PRN Kristopher Oppenheim, DO      ? Or  ? acetaminophen (TYLENOL) suppository 650 mg  650 mg Rectal Q6H PRN Kristopher Oppenheim, DO      ? atorvastatin (LIPITOR) tablet 20 mg  20 mg Oral QHS Kristopher Oppenheim, DO   20 mg at 05/24/21 2311  ? cefTRIAXone (ROCEPHIN) 1 g in sodium chloride  0.9 % 100 mL IVPB  1 g Intravenous Q24H Kristopher Oppenheim, DO   Stopped at 05/24/21 2242  ? insulin aspart (novoLOG) injection 0-15 Units  0-15 Units Subcutaneous TID WC Kristopher Oppenheim, DO   1 Units at 05/25/21 1641  ? insulin aspart (novoLOG) injection 0-5 Units  0-5 Units Subcutaneous QHS Kristopher Oppenheim, DO      ? ondansetron [facility]) tablet 4 mg  4 mg Oral Q6H PRN Kristopher Oppenheim, DO      ? Or  ? ondansetron (ZOFRAN) injection 4 mg  4 mg Intravenous Q6H PRN Kristopher Oppenheim, DO      ? pantoprazole (PROTONIX) EC tablet 40 mg  40 mg Oral BID Kristopher Oppenheim, DO   40 mg at 05/25/21 1018  ?  ? ?Family History  ?  ?Family History  ?Problem Relation Age of Onset  ? Bladder Cancer Father   ? Cancer Brother   ? Cancer Sister   ? Heart failure Mother   ? ?She indicated that her mother is deceased. She indicated that her father is deceased. She indicated that only one of her two sisters is alive. She indicated that her brother is deceased. She indicated that her maternal grandmother is deceased. She indicated that her maternal grandfather is deceased. She indicated that her paternal grandmother is deceased. She indicated that her paternal grandfather is deceased. ? ?Social History  ?  ?Social History  ? ?Socioeconomic History  ? Marital status: Widowed  ?  Spouse name: Not on file  ? Number of children: Not on file  ? Years of education: Not on file  ? Highest education level: High school graduate  ?Occupational History  ? Not on file  ?Tobacco Use  ? Smoking status: Former  ?  Packs/day: 1.00  ?  Years: 5.00  ?  Pack years: 5.00  ?  Types: Cigarettes  ? Smokeless tobacco: Never  ?Vaping Use  ? Vaping Use: Never used  ?Substance and Sexual Activity  ? Alcohol use: No  ? Drug use: No  ? Sexual activity: Not on file  ?Other Topics Concern  ? Not on file  ?Social History Narrative  ? Not on file  ? ?Social Determinants of Health  ? ?Financial Resource Strain: Not on file  ?Food Insecurity: Not on file  ?Transportation Needs: Not on file  ?Physical  Activity: Not on file  ?Stress: Not on file  ?Social Connections: Not on file  ?Intimate Partner Violence: Not on file  ?  ? ?Review of Systems  ?  ?General:  No chills, fever, night sweats or weight changes.  ?Cardiovascular:  No chest pain, dyspnea on exertion, edema, orthopnea, palpitations, paroxysmal nocturnal dyspnea. ?Dermatological: No rash, lesions/masses ?Respiratory: No cough, dyspnea ?Urologic: No hematuria, dysuria ?Abdominal:   No nausea, vomiting, diarrhea, bright red blood per rectum, melena, or hematemesis ?Neurologic:  No  visual changes, wkns, changes in mental status. ?All other systems reviewed and are otherwise negative except as noted above. ? ?  ? ?Physical Exam  ?  ?VS:  There were no vitals taken for this visit. , BMI There is no height or weight on file to calculate BMI. ?    ?GEN: Well nourished, well developed, in no acute distress. ?HEENT: normal. ?Neck: Supple, no JVD, carotid bruits, or masses. ?Cardiac: RRR, no murmurs, rubs, or gallops. No clubbing, cyanosis, edema.  Radials/DP/PT 2+ and equal bilaterally.  ?Respiratory:  Respirations regular and unlabored, clear to auscultation bilaterally. ?GI: Soft, nontender, nondistended, BS + x 4. ?MS: no deformity or atrophy. ?Skin: warm and dry, no rash. ?Neuro:  Strength and sensation are intact. ?Psych: Normal affect. ? ?Accessory Clinical Findings  ?  ?ECG personally reviewed by me today- *** - No acute changes ? ?Lab Results  ?Component Value Date  ? WBC 8.6 05/25/2021  ? HGB 7.0 (L) 05/25/2021  ? HCT 24.1 (L) 05/25/2021  ? MCV 78.5 (L) 05/25/2021  ? PLT 280 05/25/2021  ? ?Lab Results  ?Component Value Date  ? CREATININE 1.28 (H) 05/25/2021  ? BUN 24 (H) 05/25/2021  ? NA 137 05/25/2021  ? K 4.2 05/25/2021  ? CL 107 05/25/2021  ? CO2 22 05/25/2021  ? ?Lab Results  ?Component Value Date  ? ALT 22 05/25/2021  ? AST 42 (H) 05/25/2021  ? ALKPHOS 58 05/25/2021  ? BILITOT 1.1 05/25/2021  ? ?Lab Results  ?Component Value Date  ? CHOL 131  11/22/2020  ? HDL 57 11/22/2020  ? [place] 50 11/22/2020  ? TRIG 143 11/22/2020  ? CHOLHDL 2.3 11/22/2020  ?  ?Lab Results  ?Component Value Date  ? HGBA1C 5.5 05/25/2021  ? ? ?Review of Prior Studies: ? ? ?Assessment

## 2021-05-25 NOTE — Progress Notes (Signed)
DEEPIKA, DECATUR ([id number]) ?Visit Report for 05/20/2021 ?Allergy List Details ?Patient Name: Date of Service: ?[name], [place] RYA NN 05/20/2021 9:00 A M ?Medical Record Number: [id number] ?Patient Account Number: [id number] ?Date of Birth/Sex: Treating RN: ?1935/05/20 (86 y.o. Female) Rolin Barry, Tammi Klippel ?Primary Care Milta Croson: Aretta Nip Other Clinician: ?Referring Cody Oliger: ?Treating Jiyah Torpey/Extender: Kalman Shan ?[place], [place] ?Weeks in Treatment: 0 ?Allergies ?Active Allergies ?NSAIDS (Non-Steroidal Anti-Inflammatory Drug) ?Reaction: anaphylaxis ?adhesive tape ?Reaction: rash ?Allergy Notes ?Electronic Signature(s) ?Signed: 05/24/2021 9:27:34 AM By: Sandre Kitty ?Signed: 05/25/2021 4:21:04 PM By: Sharyn Creamer RN, BSN ?Entered By: Sharyn Creamer on 05/20/2021 08:49:51 ?-------------------------------------------------------------------------------- ?Arrival Information Details ?Patient Name: Date of Service: ?[name], [place] RYA NN 05/20/2021 9:00 A M ?Medical Record Number: [id number] ?Patient Account Number: [id number] ?Date of Birth/Sex: Treating RN: ?11-26-1935 (86 y.o. Female) Sharyn Creamer ?Primary Care Becket Wecker: Aretta Nip Other Clinician: ?Referring Greogory Cornette: ?Treating Denesha Brouse/Extender: Kalman Shan ?[place], [place] ?Weeks in Treatment: 0 ?Visit Information ?Patient Arrived: Wheel Chair ?Arrival Time: 08:44 ?Accompanied By: neice ?Transfer Assistance: Manual ?Patient Identification Verified: Yes ?Secondary Verification Process Completed: Yes ?Patient Has Alerts: Yes ?Patient Alerts: Patient on Blood Thinner ?Eliquis ?Electronic Signature(s) ?Signed: 05/24/2021 9:27:34 AM By: Sandre Kitty ?Signed: 05/25/2021 4:21:04 PM By: Sharyn Creamer RN, BSN ?Entered By: Sharyn Creamer on 05/20/2021 08:45:43 ?-------------------------------------------------------------------------------- ?Clinic Level of Care Assessment Details ?Patient Name: Date of Service: ?[name], [place] RYA NN 05/20/2021 9:00 A  M ?Medical Record Number: [id number] ?Patient Account Number: [id number] ?Date of Birth/Sex: Treating RN: ?[birth year] (86 y.o. Female) Rolin Barry, Tammi Klippel ?Primary Care Akbar Sacra: Aretta Nip Other Clinician: ?Referring Jerrik Housholder: ?Treating Tameca Jerez/Extender: Kalman Shan ?[place], [place] ?Weeks in Treatment: 0 ?Clinic Level of Care Assessment Items ?TOOL 1 Quantity Score ?X- 1 0 ?Use when EandM and Procedure is performed on INITIAL visit ?ASSESSMENTS - Nursing Assessment / Reassessment ?X- 1 20 ?General Physical Exam (combine w/ comprehensive assessment (listed just below) when performed on new pt. evals) ?X- 1 25 ?Comprehensive Assessment (HX, ROS, Risk Assessments, Wounds Hx, etc.) ?ASSESSMENTS - Wound and Skin Assessment / Reassessment ?X- 1 10 ?Dermatologic / Skin Assessment (not related to wound area) ?ASSESSMENTS - Ostomy and/or Continence Assessment and Care ?'[]'$  - 0 ?Incontinence Assessment and Management ?'[]'$  - 0 ?Ostomy Care Assessment and Management (repouching, etc.) ?PROCESS - Coordination of Care ?'[]'$  - 0 ?Simple Patient / Family Education for ongoing care ?X- 1 20 ?Complex (extensive) Patient / Family Education for ongoing care ?X- 1 10 ?Staff obtains Consents, Records, T Results / Process Orders ?est ?X- 1 10 ?Staff telephones HHA, Nursing Homes / Clarify orders / etc ?'[]'$  - 0 ?Routine Transfer to another Facility (non-emergent condition) ?'[]'$  - 0 ?Routine Hospital Admission (non-emergent condition) ?X- 1 15 ?New Admissions / Biomedical engineer / Ordering NPWT Apligraf, etc. ?, ?'[]'$  - 0 ?Emergency Hospital Admission (emergent condition) ?PROCESS - Special Needs ?'[]'$  - 0 ?Pediatric / Minor Patient Management ?'[]'$  - 0 ?Isolation Patient Management ?'[]'$  - 0 ?Hearing / Language / Visual special needs ?'[]'$  - 0 ?Assessment of Community assistance (transportation, D/C planning, etc.) ?'[]'$  - 0 ?Additional assistance / Altered mentation ?'[]'$  - 0 ?Support Surface(s) Assessment (bed, cushion, seat,  etc.) ?INTERVENTIONS - Miscellaneous ?'[]'$  - 0 ?External ear exam ?'[]'$  - 0 ?Patient Transfer (multiple staff / Civil Service fast streamer / Similar devices) ?'[]'$  - 0 ?Simple Staple / Suture removal (25 or less) ?'[]'$  - 0 ?Complex Staple / Suture removal (26 or more) ?'[]'$  - 0 ?Hypo/Hyperglycemic Management (do not check if billed separately) ?X- 1 15 ?Ankle /  Brachial Index (ABI) - do not check if billed separately ?Has the patient been seen at the hospital within the last three years: Yes ?Total Score: 125 ?Level Of Care: New/Established - Level 4 ?Electronic Signature(s) ?Signed: 05/20/2021 12:54:07 PM By: Deon Pilling RN, BSN ?Entered By: Deon Pilling on 05/20/2021 09:40:08 ?-------------------------------------------------------------------------------- ?Compression Therapy Details ?Patient Name: ?Date of Service: ?[name], [place] RYA NN 05/20/2021 9:00 A M ?Medical Record Number: [id number] ?Patient Account Number: [id number] ?Date of Birth/Sex: ?Treating RN: ?1935/12/14 (86 y.o. Female) Rolin Barry, Tammi Klippel ?Primary Care Janel Beane: Milagros Evener R ?Other Clinician: ?Referring Mabel Unrein: ?Treating Dejon Jungman/Extender: Kalman Shan ?[place], [place] ?Weeks in Treatment: 0 ?Compression Therapy Performed for Wound Assessment: Wound #1 Left,Circumferential Lower Leg ?Performed By: Clinician Deon Pilling, RN ?Compression Type: Three Layer ?Post Procedure Diagnosis ?Same as Pre-procedure ?Electronic Signature(s) ?Signed: 05/20/2021 12:54:07 PM By: Deon Pilling RN, BSN ?Entered By: Deon Pilling on 05/20/2021 09:39:43 ?-------------------------------------------------------------------------------- ?Compression Therapy Details ?Patient Name: ?Date of Service: ?[name], [place] RYA NN 05/20/2021 9:00 A M ?Medical Record Number: [id number] ?Patient Account Number: [id number] ?Date of Birth/Sex: ?Treating RN: ?Dec 21, 1935 (86 y.o. Female) Rolin Barry, Tammi Klippel ?Primary Care Aldyn Toon: Milagros Evener R ?Other Clinician: ?Referring Jeffey Janssen: ?Treating Floriene Jeschke/Extender:  Kalman Shan ?[place], [place] ?Weeks in Treatment: 0 ?Compression Therapy Performed for Wound Assessment: Wound #2 Right,Circumferential Lower Leg ?Performed By: Clinician Deon Pilling, RN ?Compression Type: Three Layer ?Post Procedure Diagnosis ?Same as Pre-procedure ?Electronic Signature(s) ?Signed: 05/20/2021 12:54:07 PM By: Deon Pilling RN, BSN ?Entered By: Deon Pilling on 05/20/2021 09:39:43 ?-------------------------------------------------------------------------------- ?Encounter Discharge Information Details ?Patient Name: ?Date of Service: ?[name], [place] RYA NN 05/20/2021 9:00 A M ?Medical Record Number: [id number] ?Patient Account Number: [id number] ?Date of Birth/Sex: ?Treating RN: ?08-25-1935 (86 y.o. Female) Rolin Barry, Tammi Klippel ?Primary Care Gerome Kokesh: Milagros Evener R ?Other Clinician: ?Referring Keymani Glynn: ?Treating Reema Chick/Extender: Kalman Shan ?[place], [place] ?Weeks in Treatment: 0 ?Encounter Discharge Information Items ?Discharge Condition: Stable ?Ambulatory Status: Wheelchair ?Discharge Destination: Home ?Transportation: Private Auto ?Accompanied By: niece ?Schedule Follow-up Appointment: Yes ?Clinical Summary of Care: ?Electronic Signature(s) ?Signed: 05/20/2021 12:54:07 PM By: Deon Pilling RN, BSN ?Entered By: Deon Pilling on 05/20/2021 09:41:50 ?-------------------------------------------------------------------------------- ?Lower Extremity Assessment Details ?Patient Name: ?Date of Service: ?[name], [place] RYA NN 05/20/2021 9:00 A M ?Medical Record Number: [id number] ?Patient Account Number: [id number] ?Date of Birth/Sex: ?Treating RN: ?1935/08/10 (86 y.o. Female) Sharyn Creamer ?Primary Care Adisa Litt: Milagros Evener R ?Other Clinician: ?Referring Maysel Mccolm: ?Treating Sparkle Aube/Extender: Kalman Shan ?[place], [place] ?Weeks in Treatment: 0 ?Edema Assessment ?Assessed: [Left: Yes] [Right: Yes] ?Edema: [Left: Yes] [Right: Yes] ?Calf ?Left: Right: ?Point of Measurement: 33 cm From  Medial Instep 50.5 cm 51 cm ?Ankle ?Left: Right: ?Point of Measurement: 9 cm From Medial Instep 27 cm 27.5 cm ?Knee To Floor ?Left: Right: ?From Medial Instep 47 cm ?Vascular Assessment ?Pulses: ?Dorsalis Pedis ?P

## 2021-05-25 NOTE — ED Notes (Signed)
Pt given a cup of ice

## 2021-05-25 NOTE — Progress Notes (Signed)
?PROGRESS NOTE ? ?[name] KWI:[id number] DOB: 11/02/1935 DOA: 05/24/2021 ?PCP: Aretta Nip, MD ? ? LOS: 0 days  ? ?Brief narrative: ? ?86 year old female with past medical history of type 2 diabetes, hypertension, morbid obesity, history of DVT PE on chronic anticoagulation, history of aortic stenosis status post TAVR, complete heart block status post pacemaker, history of breast and ovarian cancer presented to hospital with symptomatic anemia with feelings of wooziness dizziness and feeling clumsy.  She was noted to have hemoglobin of less than 5 in the primary care office and was sent to the hospital for further evaluation.  In the ED patient had stable vitals.  Occult was negative.  Iron was low at 18 with percent saturation of 4 and TIBC of 487.  Creatinine was mildly elevated at 1.5.  Repeat hemoglobin was 4.7.  EKG showed V paced rhythm.  Patient was ordered 2 units of packed RBC and was admitted to hospital for further evaluation and treatment.   ? ?Assessment/Plan: ? ?Principal Problem: ?  Symptomatic anemia ?Active Problems: ?  Acute cystitis ?  Stage 3a chronic kidney disease (CKD) (HCC) - baseline SCr 1.2 ?  Acute renal failure superimposed on stage 3a chronic kidney disease ([place]) ?  Morbid obesity ([place]) ?  Essential hypertension ?  S/P TAVR (transcatheter aortic valve replacement) ?  History of DVT (deep vein thrombosis) ?  Diabetes mellitus ([place]) ?  History of cardiac pacemaker - due to complete heart block ?  DNR (do not resuscitate)/DNI(Do Not Intubate) ?  Lymphedema of both lower extremities ? ? ?Symptomatic anemia with dizziness lightheadedness.  Patient had negative FOBT.  Received 2 units initially followed by 1 more unit this morning.  Latest hemoglobin of 7.0 after transfusion. Was initially hypotensive which has improved at this time.  Patient has not had colonoscopy for many years now.  Patient does have history of severe AS and TAVR and possibility of small bowel AVM.  [facility]  has been consulted.  Patient does have history of duodenal ulcer in the past.    Plan for EGD as per GI.  Continue PPI twice daily. ? ?Acute kidney injury on CKD stage IIIa. ?Creatinine today at 1.2.  improved from 1.5.  Received IV fluids.  Blood pressure has improved.  Continue to monitor. ? ?Acute cystitis.  Patient was diagnosed with UTI at PCP office.  Has been started on IV Rocephin. ? ?Bilateral lower extremity lymphedema.  On lymphedema wraps.  Follows up in wound care clinic.  We will get wound care consultation. ? ?History of complete heart block status post pacemaker. ? ?History of atrial fibrillation.  On apixaban as outpatient. ? ?History of DVT and pulmonary embolism.[name]  Hemoccult negative in the ED.  On Eliquis at home.  Hold Eliquis for now for endoscopic evaluation. ? ?Essential hypertension.  Hold antihypertensives for now. ? ?Morbid obesity. ? ?DVT prophylaxis: SCDs Start: 05/24/21 2139 ? ?Code Status: DNR/DNI ? ?Family Communication: Spoke with the patient's daughter at bedside. ? ?Status is: Observation ? ?The patient will require care spanning > 2 midnights and should be moved to inpatient because: Endoscopic evaluation, significant anemia requiring blood transfusion ?  ?Consultants: ?GI ? ?Procedures: ?PRBC transfusion 3 units ? ?Anti-infectives: ? ?Rocephin IV ? ?Anti-infectives (From admission, onward)  ? ? Start     Dose/Rate Route Frequency Ordered Stop  ? 05/24/21 2015  cefTRIAXone (ROCEPHIN) 1 g in sodium chloride 0.9 % 100 mL IVPB       ? 1 g ?  200 mL/hr over 30 Minutes Intravenous Every 24 hours 05/24/21 2012 05/27/21 2014  ? 05/24/21 1930  cephALEXin (KEFLEX) capsule 500 mg  Status:  Discontinued       ? 500 mg Oral 4 times daily 05/24/21 1927 05/24/21 2012  ? ?  ? ? ?Subjective: ?Today, patient was seen and examined at bedside.  Patient feels fatigued.  Denies any chest pain or shortness of breath denies abdominal pain.  Denies blood in stool. ? ?Objective: ?Vitals:  ? 05/25/21 0630  05/25/21 0645  ?BP: 120/71 134/67  ?Pulse: 68 70  ?Resp: 16 14  ?Temp:    ?SpO2: 98% 98%  ? ? ?Intake/Output Summary (Last 24 hours) at 05/25/2021 0803 ?Last data filed at 05/25/2021 8887 ?Gross per 24 hour  ?Intake 996.83 ml  ?Output --  ?Net 996.83 ml  ? ?There were no vitals filed for this visit. ?There is no height or weight on file to calculate BMI.  ? ?Physical Exam: ?GENERAL: Patient is alert awake and oriented. Not in obvious distress.  Morbidly obese. ?HENT: Mild pallor noted.  Pupils equally reactive to light. Oral mucosa is moist ?NECK: is supple, no gross swelling noted. ?CHEST: Clear to auscultation. No crackles or wheezes.  Diminished breath sounds bilaterally. ?CVS: S1 and S2 heard systolic murmur noted,. ?ABDOMEN: Soft, non-tender, bowel sounds are present. ?EXTREMITIES: Bilateral lower extremity edema/Lymphedema with chronic weeping wounds, currently on bilateral wraps.  Noted. ?CNS: Cranial nerves are intact. No focal motor deficits. ?SKIN: warm and dry, bilateral lower extremity lymphedema  ? ?Data Review: I have personally reviewed the following laboratory data and studies, ? ?CBC: ?Recent Labs  ?Lab 05/24/21 ?1715 05/25/21 ?0104 05/25/21 ?5797  ?WBC 8.7  --  8.6  ?NEUTROABS 6.0  --  6.0  ?HGB 4.7* 6.2* 7.0*  ?HCT 17.6* 21.7* 24.1*  ?MCV 75.5*  --  78.5*  ?PLT 314  --  280  ? ?Basic Metabolic Panel: ?Recent Labs  ?Lab 05/24/21 ?1715 05/25/21 ?0628  ?NA 139 137  ?K 4.3 4.2  ?CL 109 107  ?CO2 19* 22  ?GLUCOSE 115* 154*  ?BUN 29* 24*  ?CREATININE 1.59* 1.28*  ?CALCIUM 8.8* 8.5*  ?MG  --  1.6*  ? ?Liver Function Tests: ?Recent Labs  ?Lab 05/24/21 ?1715 05/25/21 ?0628  ?AST 33 42*  ?ALT 27 22  ?ALKPHOS 61 58  ?BILITOT 0.8 1.1  ?PROT 6.2* 5.5*  ?ALBUMIN 3.0* 2.7*  ? ?No results for input(s): LIPASE, AMYLASE in the last 168 hours. ?No results for input(s): AMMONIA in the last 168 hours. ?Cardiac Enzymes: ?No results for input(s): CKTOTAL, CKMB, CKMBINDEX, TROPONINI in the last 168 hours. ?BNP (last 3  results) ?No results for input(s): BNP in the last 8760 hours. ? ?ProBNP (last 3 results) ?No results for input(s): PROBNP in the last 8760 hours. ? ?CBG: ?Recent Labs  ?Lab 05/24/21 ?1729 05/24/21 ?2306 05/25/21 ?0725  ?GLUCAP 115* 149* 148*  ? ?No results found for this or any previous visit (from the past 240 hour(s)).  ? ?Studies: ?No results found. ? ? ? ?Flora Lipps, MD  ?[facility] ?05/25/2021  ?If 7PM-7AM, please contact night-coverage ? ? ? ? ? ? ?  ?

## 2021-05-25 NOTE — H&P (View-Only) (Signed)
Referring Provider: Flora Lipps, MD ?Primary Care Physician:  Aretta Nip, MD ?Primary Gastroenterologist:  [facility] ? ?Reason for Consultation:  Symptomatic anemia ? ?HPI: [name] is a 86 y.o. female  with history of type 2 diabetes, hypertension, morbid obesity, DVT/PE on chronic anticoagulation, aortic stenosis status post TAVR, complete heart block status post pacemaker, breast cancer, ovarian cancer, who presents to the ER 05/24/2021 with symptomatic anemia.  Patient had blood work in the primary care office [facility]).  Hemoglobin was less than 5.  Patient transferred to the ER.  Repeat hemoglobin was 4.7. Hemoccult negative. Patient has received 2 units PRBCs and is currently receiving a third unit. ? ?Patient states she began having confusion about 2 weeks ago. Also reporting some shortness of breath, but otherwise denies symptoms.  Saw PCP, was found to have low hemoglobin and sent to ER. Denies nausea, vomiting, abdominal pain, dysphagia.  She does report history of peptic ulcer, states that it perforated, however denies prior EGD.  Patient is on Eliquis for history of DVT, last dose yesterday, not sure what time.  Patient has remote smoking history of 2 years, quit 60 years ago.  Denies alcohol use, denies NSAIDs. ? ?Patient states bowel habits are regular.  She has a bowel movement every other day and denies hematochezia or melena.  She has positive family history of colon cancer in her brother, age 64s, but is up-to-date on her colon cancer screening.  Denies abdominal pain. ? ?She has lower extremity edema and wounds which she says are due to her diabetes, and these wounds have recently been weeping. Currently bandaged. ? ?Last colonoscopy 01/26/2016 with Dr. [name] at [place].  Personal history of colon polyps.  Findings included diverticulosis of the sigmoid colon, nonbleeding internal hemorrhoids, medium sized lipoma in the cecum.  Biopsies benign, no repeat recommended due to  age. ? ?No record of prior EGD.  ? ?Eliquis being held. ? ?Past Medical History:  ?Diagnosis Date  ? Anxiety   ? Aortic stenosis   ? Arthritis   ? Breast cancer ([place])   ? CANCER  ? Diabetes mellitus   ? Type II  ? Dyspnea   ? Heart murmur   ? History of chemotherapy   ? History of kidney stones   ? Hypertension   ? Mitral stenosis   ? Morbid obesity ([place])   ? BMI > 40  ? Ovarian cancer ([place])   ? Perforated duodenal ulcer ([place]) 03/23/2018  ? Presence of permanent cardiac pacemaker   ? St. Jude  ? Pulmonary embolism ([place]) 2013  ? post op  ? 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  ? 26 mm Edwards Sapien 3 transcatheter heart valve placed via percutaneous right transfemoral approach   ? Spinal stenosis   ? ? ?Past Surgical History:  ?Procedure Laterality Date  ? ABDOMINAL HYSTERECTOMY  2013  ? BREAST SURGERY  03-2004  ? lumpectomy   ? CARDIAC CATHETERIZATION    ? INSERT / REPLACE / REMOVE PACEMAKER    ? INTRAOPERATIVE TRANSTHORACIC ECHOCARDIOGRAM  07/17/2017  ? Procedure: INTRAOPERATIVE TRANSTHORACIC ECHOCARDIOGRAM;  Surgeon: Sherren Mocha, MD;  Location: [place];  Service: Open Heart Surgery;;  ? LAPAROTOMY N/A 03/22/2018  ? Procedure: grahams pouch, repair of bowel perforation;  Surgeon: Kinsinger, Arta Bruce, MD;  Location: [place];  Service: General;  Laterality: N/A;  ? LITHOTRIPSY    ? PACEMAKER IMPLANT N/A 04/21/2016  ? Procedure: Pacemaker Implant;  Surgeon: Will Meredith Leeds, MD;  Location: [place] [facility];  Service: Cardiovascular;  Laterality: N/A;  ? TONSILLECTOMY    ? TRANSCATHETER AORTIC VALVE REPLACEMENT, TRANSFEMORAL N/A 07/17/2017  ? Procedure: TRANSCATHETER AORTIC VALVE REPLACEMENT, TRANSFEMORAL;  Surgeon: Sherren Mocha, MD;  Location: [place];  Service: Open Heart Surgery;  Laterality: N/A;  ? ? ?Prior to Admission medications   ?Medication Sig Start Date End Date Taking? Authorizing Provider  ?acetaminophen (TYLENOL) 650 MG CR tablet Take 1,300 mg by mouth in the morning and at  bedtime.   Yes [provider]  ?apixaban (ELIQUIS) 5 MG TABS tablet Take 1 tablet (5 mg total) by mouth 2 (two) times daily. 10/28/20  Yes Camnitz, Ocie Doyne, MD  ?atorvastatin (LIPITOR) 20 MG tablet Take 20 mg by mouth at bedtime.    Yes [provider]  ?b complex vitamins capsule Take 1 capsule by mouth daily.   Yes [provider]  ?Calcium Citrate-Vitamin D (CALCIUM CITRATE + PO) Take 1 tablet by mouth 2 (two) times daily.    Yes [provider]  ?cholecalciferol (VITAMIN D) 1000 units tablet Take 1,000 Units by mouth 2 (two) times daily.    Yes [provider]  ?ferrous sulfate 325 (65 FE) MG tablet Take 325 mg by mouth every evening.   Yes [provider]  ?fish oil-omega-3 fatty acids 1000 MG capsule Take 1 g by mouth every evening.    Yes [provider]  ?FLUoxetine (PROZAC) 40 MG capsule Take 40 mg by mouth daily.    Yes [provider]  ?furosemide (LASIX) 40 MG tablet TAKE 1 TABLET TWICE DAILY  ON MONDAY, WEDNESDAY,  FRIDAY, &amp; SUNDAY AND 1 TAB  ONCE DAILY ON TUESDAY,  [place]; SATURDAY ?Patient taking differently: Take 40-80 mg by mouth See admin instructions. TAKE 1 TABLET TWICE DAILY  ON MONDAY, WEDNESDAY,  FRIDAY, & SUNDAY AND 1 TAB  ONCE DAILY ON TUESDAY,  THURSDAY,& SATURDAY 03/22/21  Yes Skeet Latch, MD  ?LEVEMIR FLEXTOUCH 100 UNIT/ML Pen Inject 60-70 Units into the skin 2 (two) times daily. Per sliding scale 03/20/16  Yes [provider]  ?Magnesium 400 MG CAPS Take 400 mg by mouth at bedtime.    Yes [provider]  ?metFORMIN (GLUCOPHAGE) 1000 MG tablet Take 1,000 mg by mouth 2 (two) times daily with a meal.    Yes [provider]  ?Multiple Vitamin (MULTIVITAMIN) tablet Take 1 tablet by mouth daily.     Yes [provider]  ?olmesartan (BENICAR) 5 MG tablet Take 5 mg by mouth daily. 10/27/19  Yes [provider]  ?pantoprazole (PROTONIX) 40 MG tablet Take 1 tablet  (40 mg total) by mouth 2 (two) times daily. 07/18/19  Yes Molpus, John, MD  ?pantoprazole (PROTONIX) 40 MG tablet Take 40 mg by mouth 2 (two) times daily. 11/24/19  Yes [provider]  ?saccharomyces boulardii (FLORASTOR) 250 MG capsule Take 1 capsule (250 mg total) by mouth 2 (two) times daily. You can find a probiotic over the counter. Take this while you are on antibiotics. 03/28/18  Yes Meuth, Brooke A, PA-C  ?clotrimazole (LOTRIMIN) 1 % cream Apply to scales on lower extremities once daily until resolved 05/03/21   Marzetta Board, DPM  ?[facility] injection  04/26/20   [provider]  ? ? ?Scheduled Meds: ? sodium chloride   Intravenous Once  ? atorvastatin  20 mg Oral QHS  ? insulin aspart  0-15 Units Subcutaneous TID WC  ? insulin aspart  0-5 Units Subcutaneous  QHS  ? pantoprazole  40 mg Oral BID  ? ?Continuous Infusions: ? cefTRIAXone (ROCEPHIN)  IV Stopped (05/24/21 2242)  ? magnesium sulfate bolus IVPB    ? ?PRN Meds:.acetaminophen **OR** acetaminophen, ondansetron **OR** ondansetron (ZOFRAN) IV ? ?Allergies as of 05/24/2021 - Review Complete 05/24/2021  ?Allergen Reaction Noted  ? Nsaids Other (See Comments) 03/26/2018  ? Tape Rash 07/16/2017  ? ? ?Family History  ?Problem Relation Age of Onset  ? Bladder Cancer Father   ? Cancer Brother   ? Cancer Sister   ? Heart failure Mother   ? ? ?Social History  ? ?Socioeconomic History  ? Marital status: Widowed  ?  Spouse name: Not on file  ? Number of children: Not on file  ? Years of education: Not on file  ? Highest education level: High school graduate  ?Occupational History  ? Not on file  ?Tobacco Use  ? Smoking status: Former  ?  Packs/day: 1.00  ?  Years: 5.00  ?  Pack years: 5.00  ?  Types: Cigarettes  ? Smokeless tobacco: Never  ?Vaping Use  ? Vaping Use: Never used  ?Substance and Sexual Activity  ? Alcohol use: No  ? Drug use: No  ? Sexual activity: Not on file  ?Other Topics Concern  ? Not on file  ?Social History Narrative  ? Not  on file  ? ?Social Determinants of Health  ? ?Financial Resource Strain: Not on file  ?Food Insecurity: Not on file  ?Transportation Needs: Not on file  ?Physical Activity: Not on file  ?Stress: Not on file  ?S

## 2021-05-25 NOTE — Progress Notes (Signed)
Mobility Specialist Progress Note  ? ? 05/25/21 1614  ?Mobility  ?Activity Refused mobility  ? ?Pt c/o arthritic pain. Pt interested in possible PT/OT eval for determining HH services, RN notified.  ? ?[name] ?Mobility Specialist  ?  ?

## 2021-05-25 NOTE — Progress Notes (Signed)
[name], [name] ([id number]) ?Visit Report for 05/20/2021 ?Chief Complaint Document Details ?Patient Name: Date of Service: ?[name], [place] RYA NN 05/20/2021 9:00 A M ?Medical Record Number: [id number] ?Patient Account Number: [id number] ?Date of Birth/Sex: Treating RN: ?August 20, 1935 (86 y.o. Female) ?Primary Care Provider: Aretta [name] Other Clinician: ?Referring Provider: ?Treating Provider/Extender: [name] ?[place], [place] ?Weeks in Treatment: 0 ?Information Obtained from: Patient ?Chief Complaint ?Bilateral lower extremity wounds ?Electronic Signature(s) ?Signed: 05/20/2021 9:53:09 AM By: Emily Shan DO ?Entered By: [name] on 05/20/2021 09:46:45 ?-------------------------------------------------------------------------------- ?HPI Details ?Patient Name: Date of Service: ?[name], [place] RYA NN 05/20/2021 9:00 A M ?Medical Record Number: [id number] ?Patient Account Number: [id number] ?Date of Birth/Sex: Treating RN: ?06-13-1935 (86 y.o. Female) ?Primary Care Provider: Aretta [name] Other Clinician: ?Referring Provider: ?Treating Provider/Extender: [name] ?[place], [place] ?Weeks in Treatment: 0 ?History of Present Illness ?HPI Description: Admission 05/20/2021 ?Ms. [name] is an 86 year old female with a past medical history of insulin-dependent type 2 diabetes, complete heart block with pacemaker, and ?lymphedema that presents the clinic for a several month history of weeping to her legs bilaterally. She was referred to our clinic by [name] from [facility]. Patient reports a several year history of waxing and waning to her bilateral lower extremity weeping. She has been treated with Unna boots in ?the past. She has compression stockings but has a hard time putting these on. She does not have lymphedema pumps. She currently denies signs of ?infection. ?Electronic Signature(s) ?Signed: 05/20/2021 9:53:09 AM By: Emily Shan DO ?Entered By: [name]  on 05/20/2021 09:49:27 ?-------------------------------------------------------------------------------- ?Physical Exam Details ?Patient Name: Date of Service: ?[name], [place] RYA NN 05/20/2021 9:00 A M ?Medical Record Number: [id number] ?Patient Account Number: [id number] ?Date of Birth/Sex: Treating RN: ?[birth year] (86 y.o. Female) ?Primary Care Provider: Aretta [name] Other Clinician: ?Referring Provider: ?Treating Provider/Extender: [name] ?[place], [place] ?Weeks in Treatment: 0 ?Constitutional ?respirations regular, non-labored and within target range for patient.[name] Kitchen ?Cardiovascular ?2+ dorsalis pedis/posterior tibialis pulses. ?Psychiatric ?pleasant and cooperative. ?Notes ?Bilateral lower extremity cobblestoning with lymphedema skin changes and weeping. No signs of surrounding infection. ?Electronic Signature(s) ?Signed: 05/20/2021 9:53:09 AM By: Emily Shan DO ?Entered By: [name] on 05/20/2021 09:50:12 ?-------------------------------------------------------------------------------- ?Physician Orders Details ?Patient Name: Date of Service: ?[name], [place] RYA NN 05/20/2021 9:00 A M ?Medical Record Number: [id number] ?Patient Account Number: [id number] ?Date of Birth/Sex: Treating RN: ?[birth year] (86 y.o. Female) Rolin Barry, Tammi Klippel ?Primary Care Provider: Aretta [name] Other Clinician: ?Referring Provider: ?Treating Provider/Extender: [name] ?[place], [place] ?Weeks in Treatment: 0 ?Verbal / Phone Orders: No ?Diagnosis Coding ?ICD-10 Coding ?Code Description ?I89.0 Lymphedema, not elsewhere classified ?T02.409 Non-pressure chronic ulcer of other part of right lower leg with fat layer exposed ?B35.329 Non-pressure chronic ulcer of other part of left lower leg with fat layer exposed ?E11.622 Type 2 diabetes mellitus with other skin ulcer ?Follow-up Appointments ?ppointment in 1 week. - Dr. Heber Mays [name] Friday Room 9 ?Return A ?Bathing/ Shower/ Hygiene ?May shower with protection but do  not get wound dressing(s) wet. - when not changing the dressings. ?May shower and wash wound with soap and water. - with dressing changes. ?Edema Control - Lymphedema / SCD / Other ?Elevate legs to the level of the heart or above for 30 minutes daily and/or when sitting, a frequency of: - 3-4 times a throughout the day and ?while sitting elevate. ?Avoid standing for long periods of time. ?Exercise regularly ?Home Health ?New wound care orders this week;  continue Home Health for wound care. May utilize formulary equivalent dressing for wound treatment ?orders unless otherwise specified. - home health to change dressing x2 a week and once a week at wound center. See wound care orders below. ?Other Home Health Orders/Instructions: - [facility] ?Wound Treatment ?Wound #1 - Lower Leg Wound Laterality: Left, Circumferential ?Cleanser: Soap and Water (Home Health) 3 x Per Week/30 Days ?Discharge Instructions: May shower and wash wound with dial antibacterial soap and water prior to dressing change. ?Cleanser: Wound Cleanser (Home Health) 3 x Per Week/30 Days ?Discharge Instructions: Cleanse the wound with wound cleanser prior to applying a clean dressing using gauze sponges, not tissue or cotton balls. ?Peri-Wound Care: Zinc Oxide Ointment 30g tube (Home Health) 3 x Per Week/30 Days ?Discharge Instructions: Apply Zinc Oxide to periwound with each dressing change ?Peri-Wound Care: Sween Lotion (Moisturizing lotion) (Home Health) 3 x Per Week/30 Days ?Discharge Instructions: Apply moisturizing lotion as directed ?Prim Dressing: KerraCel Ag Gelling Fiber Dressing, 4x5 in (silver alginate) (Home Health) 3 x Per Week/30 Days ?ary ?Discharge Instructions: Apply silver alginate to wound bed as instructed ?Secondary Dressing: Woven Gauze Sponge, Non-Sterile 4x4 in (Home Health) 3 x Per Week/30 Days ?Discharge Instructions: Apply over primary dressing as directed. ?Secondary Dressing: ABD Pad, 8x10 (Home Health) 3 x  Per Week/30 Days ?Discharge Instructions: Apply over primary dressing as directed. ?Secondary Dressing: Zetuvit Plus 4x8 in [facility]) 3 x Per Week/30 Days ?Discharge Instructions: Apply over primary dressing as directed. ?Compression Wrap: ThreePress (3 layer compression wrap) (Home Health) 3 x Per Week/30 Days ?Discharge Instructions: Apply three layer compression as directed. ?Wound #2 - Lower Leg Wound Laterality: Right, Circumferential ?Cleanser: Soap and Water (Home Health) 3 x Per Week/30 Days ?Discharge Instructions: May shower and wash wound with dial antibacterial soap and water prior to dressing change. ?Cleanser: Wound Cleanser (Home Health) 3 x Per Week/30 Days ?Discharge Instructions: Cleanse the wound with wound cleanser prior to applying a clean dressing using gauze sponges, not tissue or cotton balls. ?Peri-Wound Care: Zinc Oxide Ointment 30g tube (Home Health) 3 x Per Week/30 Days ?Discharge Instructions: Apply Zinc Oxide to periwound with each dressing change ?Peri-Wound Care: Sween Lotion (Moisturizing lotion) (Home Health) 3 x Per Week/30 Days ?Discharge Instructions: Apply moisturizing lotion as directed ?Prim Dressing: KerraCel Ag Gelling Fiber Dressing, 4x5 in (silver alginate) (Home Health) 3 x Per Week/30 Days ?ary ?Discharge Instructions: Apply silver alginate to wound bed as instructed ?Secondary Dressing: Woven Gauze Sponge, Non-Sterile 4x4 in (Home Health) 3 x Per Week/30 Days ?Discharge Instructions: Apply over primary dressing as directed. ?Secondary Dressing: ABD Pad, 8x10 (Home Health) 3 x Per Week/30 Days ?Discharge Instructions: Apply over primary dressing as directed. ?Secondary Dressing: Zetuvit Plus 4x8 in [facility]) 3 x Per Week/30 Days ?Discharge Instructions: Apply over primary dressing as directed. ?Compression Wrap: ThreePress (3 layer compression wrap) (Home Health) 3 x Per Week/30 Days ?Discharge Instructions: Apply three layer compression as directed. ?Electronic  Signature(s) ?Signed: 05/20/2021 12:29:21 PM By: Emily Shan DO ?Signed: 05/20/2021 12:54:07 PM By: Deon Pilling RN, BSN ?Previous Signature: 05/20/2021 9:53:09 AM Version By: Emily Shan DO ?E

## 2021-05-26 ENCOUNTER — Inpatient Hospital Stay (HOSPITAL_COMMUNITY): Payer: Medicare Other | Admitting: Anesthesiology

## 2021-05-26 ENCOUNTER — Encounter (HOSPITAL_COMMUNITY)
Admission: EM | Disposition: A | Discharge status: Home Health | Payer: Self-pay | Source: Home / Self Care | Attending: Internal Medicine

## 2021-05-26 ENCOUNTER — Encounter (HOSPITAL_BASED_OUTPATIENT_CLINIC_OR_DEPARTMENT_OTHER): Payer: Medicare Other | Admitting: Internal Medicine

## 2021-05-26 DIAGNOSIS — D649 Anemia, unspecified: Secondary | ICD-10-CM | POA: Diagnosis not present

## 2021-05-26 DIAGNOSIS — D509 Iron deficiency anemia, unspecified: Secondary | ICD-10-CM

## 2021-05-26 DIAGNOSIS — E1122 Type 2 diabetes mellitus with diabetic chronic kidney disease: Secondary | ICD-10-CM | POA: Diagnosis not present

## 2021-05-26 DIAGNOSIS — N3000 Acute cystitis without hematuria: Secondary | ICD-10-CM | POA: Diagnosis not present

## 2021-05-26 DIAGNOSIS — E119 Type 2 diabetes mellitus without complications: Secondary | ICD-10-CM

## 2021-05-26 DIAGNOSIS — Z7984 Long term (current) use of oral hypoglycemic drugs: Secondary | ICD-10-CM

## 2021-05-26 DIAGNOSIS — N179 Acute kidney failure, unspecified: Secondary | ICD-10-CM | POA: Diagnosis not present

## 2021-05-26 DIAGNOSIS — K2970 Gastritis, unspecified, without bleeding: Secondary | ICD-10-CM

## 2021-05-26 HISTORY — PX: ESOPHAGOGASTRODUODENOSCOPY: SHX5428

## 2021-05-26 LAB — TYPE AND SCREEN
ABO/RH(D): B POS
Antibody Screen: NEGATIVE
Unit division: 0
Unit division: 0
Unit division: 0

## 2021-05-26 LAB — BASIC METABOLIC PANEL
Anion gap: 7 (ref 5–15)
BUN: 15 mg/dL (ref 8–23)
CO2: 23 mmol/L (ref 22–32)
Calcium: 8.3 mg/dL — ABNORMAL LOW (ref 8.9–10.3)
Chloride: 108 mmol/L (ref 98–111)
Creatinine, Ser: 1.02 mg/dL — ABNORMAL HIGH (ref 0.44–1.00)
GFR, Estimated: 55 mL/min — ABNORMAL LOW (ref >60)
Glucose, Bld: 163 mg/dL — ABNORMAL HIGH (ref 70–99)
Potassium: 3.8 mmol/L (ref 3.5–5.1)
Sodium: 138 mmol/L (ref 135–145)

## 2021-05-26 LAB — BPAM RBC
Blood Product Expiration Date: 202304152359
Blood Product Expiration Date: 202304152359
Blood Product Expiration Date: 202304152359
ISSUE DATE / TIME: 202303211851
ISSUE DATE / TIME: 202303212251
ISSUE DATE / TIME: 202303220348
Unit Type and Rh: 7300
Unit Type and Rh: 7300
Unit Type and Rh: 7300

## 2021-05-26 LAB — CBC
HCT: 25.1 % — ABNORMAL LOW (ref 36.0–46.0)
Hemoglobin: 7.3 g/dL — ABNORMAL LOW (ref 12.0–15.0)
MCH: 22.8 pg — ABNORMAL LOW (ref 26.0–34.0)
MCHC: 29.1 g/dL — ABNORMAL LOW (ref 30.0–36.0)
MCV: 78.4 fL — ABNORMAL LOW (ref 80.0–100.0)
Platelets: 280 10*3/uL (ref 150–400)
RBC: 3.2 MIL/uL — ABNORMAL LOW (ref 3.87–5.11)
RDW: 20.1 % — ABNORMAL HIGH (ref 11.5–15.5)
WBC: 9.2 10*3/uL (ref 4.0–10.5)
nRBC: 1.7 % — ABNORMAL HIGH (ref 0.0–0.2)

## 2021-05-26 LAB — GLUCOSE, CAPILLARY
Glucose-Capillary: 145 mg/dL — ABNORMAL HIGH (ref 70–99)
Glucose-Capillary: 125 mg/dL — ABNORMAL HIGH (ref 70–99)
Glucose-Capillary: 197 mg/dL — ABNORMAL HIGH (ref 70–99)
Glucose-Capillary: 137 mg/dL — ABNORMAL HIGH (ref 70–99)
Glucose-Capillary: 127 mg/dL — ABNORMAL HIGH (ref 70–99)

## 2021-05-26 LAB — MAGNESIUM: Magnesium: 1.6 mg/dL — ABNORMAL LOW (ref 1.7–2.4)

## 2021-05-26 SURGERY — EGD (ESOPHAGOGASTRODUODENOSCOPY)
Anesthesia: Monitor Anesthesia Care

## 2021-05-26 MED ORDER — LACTATED RINGERS IV SOLN: INTRAVENOUS | Status: DC

## 2021-05-26 MED ORDER — PEG 3350-KCL-NA BICARB-NACL 420 G PO SOLR
4000.0000 mL | Freq: Once | ORAL | Status: AC
  Administered 2021-05-26: 4000 mL via ORAL
  Filled 2021-05-26: qty 4000

## 2021-05-26 MED ORDER — PROPOFOL 10 MG/ML IV BOLUS
INTRAVENOUS | Status: DC | PRN
  Administered 2021-05-26: 20 mg via INTRAVENOUS

## 2021-05-26 MED ORDER — MAGNESIUM OXIDE -MG SUPPLEMENT 400 (240 MG) MG PO TABS
400.0000 mg | ORAL_TABLET | Freq: Two times a day (BID) | ORAL | Status: DC
  Administered 2021-05-26 – 2021-05-29 (×7): 400 mg via ORAL
  Filled 2021-05-26 (×7): qty 1

## 2021-05-26 MED ORDER — LACTATED RINGERS IV SOLN: INTRAVENOUS | Status: DC | PRN

## 2021-05-26 MED ORDER — SODIUM CHLORIDE 0.9 % IV SOLN: INTRAVENOUS | Status: DC

## 2021-05-26 MED ORDER — PROPOFOL 500 MG/50ML IV EMUL
INTRAVENOUS | Status: DC | PRN
  Administered 2021-05-26: 100 ug/kg/min via INTRAVENOUS

## 2021-05-26 MED ORDER — PHENYLEPHRINE 40 MCG/ML (10ML) SYRINGE FOR IV PUSH (FOR BLOOD PRESSURE SUPPORT)
PREFILLED_SYRINGE | INTRAVENOUS | Status: DC | PRN
  Administered 2021-05-26: 80 ug via INTRAVENOUS

## 2021-05-26 NOTE — Progress Notes (Addendum)
?PROGRESS NOTE ? ?[name] VZD:[id number] DOB: [birth year] DOA: 05/24/2021 ?PCP: Aretta Nip, MD ? ? LOS: 1 day  ? ?Brief narrative: ? 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  She was noted to have hemoglobin of less than 5 in the primary care office and was sent to the hospital for further evaluation.  In the ED patient had stable vitals.  Occult was negative.  Iron was low at 18 with percent saturation of 4 and TIBC of 487.  Creatinine was mildly elevated at 1.5.  Repeat hemoglobin was 4.7.  EKG showed V paced rhythm.  Patient was ordered 2 units of packed RBC and was admitted to hospital for further evaluation and treatment.   ? ?During hospitalization, GI was consulted and patient underwent EGD on 05/26/2021 with findings of mild gastritis.  Colonoscopy will be pursued. ? ?Assessment/Plan: ? ?Principal Problem: ?  Symptomatic anemia ?Active Problems: ?  Acute cystitis ?  Stage 3a chronic kidney disease (CKD) (HCC) - baseline SCr 1.2 ?  Acute renal failure superimposed on stage 3a chronic kidney disease ([place]) ?  Morbid obesity ([place]) ?  Essential hypertension ?  S/P TAVR (transcatheter aortic valve replacement) ?  History of DVT (deep vein thrombosis) ?  Diabetes mellitus ([place]) ?  History of cardiac pacemaker - due to complete heart block ?  DNR (do not resuscitate)/DNI(Do Not Intubate) ?  Lymphedema of both lower extremities ?  GI bleed ? ? ?Symptomatic anemia ?  Patient had negative FOBT but significant anemia on presentation with initial hemoglobin of 4.7.  Received 3 units of packed RBC.  Patient has not had colonoscopy for many years now.  History of duodenal ulcer in the  past.  Patient does have history of severe AS and TAVR.  GI on board and patient underwent EGD 05/26/2021 with mild gastritis.  Plan is to proceed with colonoscopy.  Continue PPI.  Check CBC in AM. ? ?Acute kidney injury on CKD stage IIIa. ?Creatinine today at 1.0 with improvement from initial 1.5.  Received IV fluids.  Blood pressure has improved.  Continue to monitor. ? ?Diabetes mellitus type 2 controlled with mild hyperglycemia.  We will continue with sliding scale insulin while in the hospital. ? ?Acute cystitis.  Patient was diagnosed with UTI at PCP office.  Continue IV Rocephin. ? ?Bilateral lower extremity lymphedema.  On lymphedema wraps.  Follows up in wound care clinic.  She will wound care while in the hospital. ? ?History of complete heart block status post pacemaker. ? ?History of atrial fibrillation.  On apixaban as outpatient.  Currently on hold. ? ?History of DVT and pulmonary embolism.[name] Kitchen  Hemoccult negative in the ED.  On Eliquis at home.  Hold Eliquis for now for endoscopic evaluation and plan for colonoscopy in AM.  Resume if negative and okay with GI.[name] Kitchen ? ?Essential hypertension.  We will continue to hold antihypertensives for now. ? ?Hypomagnesemia.  We will replace.  Check levels in a.m. ? ?Obesity. ? ?DVT prophylaxis: SCDs Start: 05/24/21 2139 ? ?Code Status: DNR/DNI ? ?Family Communication: Spoke with the patient's daughter at bedside. ? ?Status is: Inpatient ? ?The patient is inpatient because: Need for colonoscopy, status post endoscopy, significant anemia requiring blood transfusion ? ?Consultants: ?  GI ? ?Procedures: ?PRBC transfusion 3 units ? ?Anti-infectives: ? ?Rocephin IV 3/21> ? ?Anti-infectives (From admission, onward)  ? ? Start     Dose/Rate Route Frequency Ordered Stop  ? 05/24/21 2015  cefTRIAXone (ROCEPHIN) 1 g in sodium chloride 0.9 % 100 mL IVPB       ? 1 g ?200 mL/hr over 30 Minutes Intravenous Every 24 hours 05/24/21 2012 05/27/21 2014  ? 05/24/21 1930  cephALEXin (KEFLEX)  capsule 500 mg  Status:  Discontinued       ? 500 mg Oral 4 times daily 05/24/21 1927 05/24/21 2012  ? ?  ? ? ?Subjective: ?Today, patient was seen and examined at bedside.  Patient states that she did have a rough night and could not sleep well.  Denies pain, nausea, vomiting, blood in the stool. ? ?Objective: ?Vitals:  ? 05/26/21 0950 05/26/21 1107  ?BP: 117/66 (!) 124/56  ?Pulse: 76 76  ?Resp: 20 20  ?Temp: 97.9 ?F (36.6 ?C) 98 ?F (36.7 ?C)  ?SpO2: 95% 100%  ? ? ?Intake/Output Summary (Last 24 hours) at 05/26/2021 1111 ?Last data filed at 05/26/2021 3154 ?Gross per 24 hour  ?Intake 100 ml  ?Output 1250 ml  ?Net -1150 ml  ? ? ?Filed Weights  ? 05/25/21 0911  ?Weight: 111.1 kg  ? ?Body mass index is 38.36 kg/m?.  ? ?Physical Exam: ? ?General: Obese built, not in obvious distress ?HENT:   Mild pallor noted.  Oral mucosa is moist.  ?Chest:  Clear breath sounds.  Diminished breath sounds bilaterally. No crackles or wheezes.  ?CVS: S1 &S2 heard. No murmur.  Regular rate and rhythm. ?Abdomen: Soft, nontender, nondistended.  Bowel sounds are heard.   ?Extremities: No cyanosis, clubbing with bilateral lymphedema wrap.  Peripheral pulses are palpable. ?Psych: Alert, awake and oriented, normal mood ?CNS:  No cranial nerve deficits.  Power equal in all extremities.   ?Skin: Warm and dry.  Bilateral lower extremity lymphedema. ? ? ?Data Review: I have personally reviewed the following laboratory data and studies, ? ?CBC: ?Recent Labs  ?Lab 05/24/21 ?1715 05/25/21 ?0104 05/25/21 ?0086 05/26/21 ?7619  ?WBC 8.7  --  8.6 9.2  ?NEUTROABS 6.0  --  6.0  --   ?HGB 4.7* 6.2* 7.0* 7.3*  ?HCT 17.6* 21.7* 24.1* 25.1*  ?MCV 75.5*  --  78.5* 78.4*  ?PLT 314  --  280 280  ? ? ?Basic Metabolic Panel: ?Recent Labs  ?Lab 05/24/21 ?1715 05/25/21 ?0628 05/26/21 ?5093  ?NA 139 137 138  ?K 4.3 4.2 3.8  ?CL 109 107 108  ?CO2 19* 22 23  ?GLUCOSE 115* 154* 163*  ?BUN 29* 24* 15  ?CREATININE 1.59* 1.28* 1.02*  ?CALCIUM 8.8* 8.5* 8.3*  ?MG  --  1.6* 1.6*   ? ? ?Liver Function Tests: ?Recent Labs  ?Lab 05/24/21 ?1715 05/25/21 ?0628  ?AST 33 42*  ?ALT 27 22  ?ALKPHOS 61 58  ?BILITOT 0.8 1.1  ?PROT 6.2* 5.5*  ?ALBUMIN 3.0* 2.7*  ? ? ?No results for input(s): LIPASE, AMYLASE in the last 168 hours. ?No results for input(s): AMMONIA in the last 168 hours. ?Cardiac Enzymes: ?No results for input(s): CKTOTAL, CKMB, CKMBINDEX, TROPONINI in the last 168 hours. ?BNP (last 3 results) ?No results for input(s): BNP in the last 8760 hours. ? ?ProBNP (last 3 results) ?No results for input(s): PROBNP in the last 8760 hours. ? ?CBG: ?Recent Labs  ?Lab 05/25/21 ?1218 05/25/21 ?1632 05/25/21 ?2144 05/26/21 ?2671 05/26/21 ?2458  ?GLUCAP 155* 123* 138*  145* 125*  ? ? ?No results found for this or any previous visit (from the past 240 hour(s)).  ? ?Studies: ?No results found. ? ? ? ?Flora Lipps, MD  ?[facility] ?05/26/2021  ?If 7PM-7AM, please contact night-coverage ? ? ? ? ? ? ?  ?

## 2021-05-26 NOTE — Progress Notes (Signed)
Patient off floor to endo.  

## 2021-05-26 NOTE — Progress Notes (Signed)
?  Transition of Care (TOC) Screening Note ? ? ?Patient Details  ?Name: [name] ?Date of Birth: Apr 17, 1935 ? ? ?Transition of Care (TOC) CM/SW Contact:    ?Milas Gain, LCSWA ?Phone Number: ?05/26/2021, 4:24 PM ? ? ? ?Transition of Care Department [facility]) has reviewed patient and no TOC needs have been identified at this time. We will continue to monitor patient advancement through interdisciplinary progression rounds. If new patient transition needs arise, please place a TOC consult. ?  ?

## 2021-05-26 NOTE — Transfer of Care (Signed)
Immediate Anesthesia Transfer of Care Note ? ?Patient: Keidra Myler ? ?Procedure(s) Performed: ESOPHAGOGASTRODUODENOSCOPY (EGD) ? ?Patient Location: PACU ? ?Anesthesia Type:MAC ? ?Level of Consciousness: awake, alert  and patient cooperative ? ?Airway & Oxygen Therapy: Patient Spontanous Breathing ? ?Post-op Assessment: Report given to RN and Post -op Vital signs reviewed and stable ? ?Post vital signs: Reviewed and stable ? ?Last Vitals:  ?Vitals Value Taken Time  ?BP 117/86 05/26/21 0904  ?Temp    ?Pulse 90 05/26/21 0909  ?Resp 21 05/26/21 0909  ?SpO2 98 % 05/26/21 0909  ?Vitals shown include unvalidated device data. ? ?Last Pain:  ?Vitals:  ? 05/26/21 0811  ?TempSrc: Temporal  ?PainSc: 0-No pain  ?   ? ?Patients Stated Pain Goal: 0 (05/25/21 1945) ? ?Complications: No notable events documented. ?

## 2021-05-26 NOTE — Anesthesia Postprocedure Evaluation (Signed)
Anesthesia Post Note ? ?Patient: [name] ? ?Procedure(s) Performed: ESOPHAGOGASTRODUODENOSCOPY (EGD) ? ?  ? ?Patient location during evaluation: Endoscopy ?Anesthesia Type: MAC ?Level of consciousness: awake and alert ?Pain management: pain level controlled ?Vital Signs Assessment: post-procedure vital signs reviewed and stable ?Respiratory status: spontaneous breathing, nonlabored ventilation, respiratory function stable and patient connected to nasal cannula oxygen ?Cardiovascular status: stable and blood pressure returned to baseline ?Postop Assessment: no apparent nausea or vomiting ?Anesthetic complications: no ? ? ?No notable events documented. ? ?Last Vitals:  ?Vitals:  ? 05/26/21 0950 05/26/21 1107  ?BP: 117/66 (!) 124/56  ?Pulse: 76 76  ?Resp: 20 20  ?Temp: 36.6 ?C 36.7 ?C  ?SpO2: 95% 100%  ?  ?Last Pain:  ?Vitals:  ? 05/26/21 1107  ?TempSrc: Oral  ?PainSc:   ? ? ?  ?  ?  ?  ?  ?  ? ?[name] ? ? ? ? ?

## 2021-05-26 NOTE — Consult Note (Signed)
WOC Nurse Consult Note: ?Patient receiving care in [place]. Consult completed remotely after review of record. Patient is followed by [facility] for BLE lymphedema with ulcerations. ?Reason for Consult: BLE lymphedema with ulcers ?Wound type: as above.  Patient currently has compressions wraps in place. ?Pressure Injury POA: Yes/No/NA ?Measurement: ?Wound bed: ?Drainage (amount, consistency, odor)  ?Periwound: ?Dressing procedure/placement/frequency: ? ?Remove existing unna boots. Wash legs with soap and water, pat dry. Apply Sween moisturizing ointment (pink and white tube in clean utility). Cover each wound area with Aquacel Advantage Kellie Simmering [phone]), then place a foam dressing over that. WHEN THIS IS COMPLETED, contact the [facility] to replace the unna boots.  Unna boots to be changed on Mondays and Thursdays. ? ?Lymphedema  Resources (updated 01/2021) ?Each site requires a referral from your primary care MD ?[place] ?[place] ?[place], [place]  ?[phone] (Upper extremities) ? ?[address] ?[place], [place] ?[phone] (Lower extremities, PATIENT CAN NOT HAVE A WOUND) ? ?[place] ?[address] ?[place], [place] [address] ?[phone] ? ?[facility] ?[address] ?Medical Office [address]  ?[place], [place] ?[phone]  ?[place] ?[address] ?[place], [place] [address] ?[phone] ? ?[facility]  ?(only treatment for lymphedema related to cancer diagnosis) ?[address]  ?[place], [place] [address] ?[phone]   ? ?[place] ?[place] ?[place], [place] [address] ?[phone] ?[place] ?(formerly [place]) ?[address] ?[place],  [address] ?[phone] ? ? ? Monitor the wound area(s) for  worsening of condition such as: ?Signs/symptoms of infection,  ?Increase in size,  ?Development of or worsening of odor, ?Development of pain, or increased pain at the affected locations.  Notify the medical team if any of these develop. ? ?Thank you for the consult.  Discussed plan of care with the bedside nurse.  [facility] nurse will not follow at this time.  Please re-consult the [place] team if needed. ? ?Val Riles, RN, MSN, CWOCN, CNS-BC, pager [phone]  ?  ?

## 2021-05-26 NOTE — Interval H&P Note (Signed)
History and Physical Interval Note: ? ?05/26/2021 ?8:40 AM ? ?[name]  has presented today for surgery, with the diagnosis of Symptomatic anemia.  The various methods of treatment have been discussed with the patient and family. After consideration of risks, benefits and other options for treatment, the patient has consented to  Procedure(s): ?ESOPHAGOGASTRODUODENOSCOPY (EGD) (N/A) as a surgical intervention.  The patient's history has been reviewed, patient examined, no change in status, stable for surgery.  I have reviewed the patient's chart and labs.  Questions were answered to the patient's satisfaction.   ? ? ?[name] ? ? ?

## 2021-05-26 NOTE — Op Note (Signed)
[facility] ?Patient Name: [name] ?Procedure Date : 05/26/2021 ?MRN: [id number] ?Attending MD: [name] , MD ?Date of Birth: 12-08-1935 ?CSN: [id number] ?Age: 86 ?Admit Type: Inpatient ?Procedure:                Upper GI endoscopy ?Indications:              Iron deficiency anemia ?Providers:                [name], MD, Burtis Junes, RN, Narda Rutherford  ?                          [place], Technician, Vickii Penna, CRNA ?Referring MD:             hospital team ?Medicines:                Propofol per Anesthesia, Monitored Anesthesia Care ?Complications:            No immediate complications. ?Estimated Blood Loss:     Estimated blood loss: none. ?Procedure:                Pre-Anesthesia Assessment: ?                          - Prior to the procedure, a History and Physical  ?                          was performed, and patient medications and  ?                          allergies were reviewed. The patient's tolerance of  ?                          previous anesthesia was also reviewed. The risks  ?                          and benefits of the procedure and the sedation  ?                          options and risks were discussed with the patient.  ?                          All questions were answered, and informed consent  ?                          was obtained. Prior Anticoagulants: The patient has  ?                          taken Eliquis (apixaban), last dose was stopped at  ?                          admission. ASA Grade Assessment: III - A patient  ?                          with severe systemic disease. After reviewing the  ?  risks and benefits, the patient was deemed in  ?                          satisfactory condition to undergo the procedure. ?                          After obtaining informed consent, the endoscope was  ?                          passed under direct vision. Throughout the  ?                          procedure, the patient's blood  pressure, pulse, and  ?                          oxygen saturations were monitored continuously. The  ?                          [id number]-[id number] ([id number]) Olympus endoscope was introduced  ?                          through the mouth, and advanced to the second part  ?                          of duodenum. The upper GI endoscopy was  ?                          accomplished without difficulty. The patient  ?                          tolerated the procedure well. ?Scope In: ?Scope Out: ?Findings: ?     The examined esophagus was normal. ?     The Z-line was regular and was found 40 cm from the incisors. ?     Localized mild inflammation characterized by congestion (edema) and  ?     erythema was found in the prepyloric region of the stomach. ?     The exam of the stomach was otherwise normal. ?     The cardia and gastric fundus were normal on retroflexion. ?     The examined duodenum was normal. ?Impression:               - Normal esophagus. ?                          - Z-line regular, 40 cm from the incisors. ?                          - Gastritis. ?                          - Normal examined duodenum. ?                          - No specimens collected. ?Recommendation:           - No source of anemia on EGD. Needs updated  ?  colonoscopy tomorrow. ?                          - Continue present medications. ?Procedure Code(s):        --- Professional --- ?                          [id number], Esophagogastroduodenoscopy, flexible,  ?                          transoral; diagnostic, including collection of  ?                          specimen(s) by brushing or washing, when performed  ?                          (separate procedure) ?Diagnosis Code(s):        --- Professional --- ?                          D50.9, Iron deficiency anemia, unspecified ?                          K29.70, Gastritis, unspecified, without bleeding ?CPT copyright 2019 American Medical Association. All rights reserved. ?The codes  documented in this report are preliminary and upon coder review may  ?be revised to meet current compliance requirements. ?[name], MD ?05/26/2021 9:04:02 AM ?This report has been signed electronically. ?Number of Addenda: 0 ?

## 2021-05-26 NOTE — Brief Op Note (Signed)
EGD showed mild gastritis and otherwise was normal. Colonoscopy needed and will do tomorrow if patient drinks colon prep today. Clear liquid diet. NPO p MN. Continue supportive care. ?

## 2021-05-27 ENCOUNTER — Encounter (HOSPITAL_COMMUNITY)
Admission: EM | Disposition: A | Discharge status: Home Health | Payer: Self-pay | Source: Home / Self Care | Attending: Internal Medicine

## 2021-05-27 ENCOUNTER — Ambulatory Visit: Payer: Medicare Other | Admitting: Adult Health

## 2021-05-27 ENCOUNTER — Encounter (HOSPITAL_COMMUNITY): Payer: Self-pay | Admitting: Internal Medicine

## 2021-05-27 ENCOUNTER — Inpatient Hospital Stay (HOSPITAL_COMMUNITY): Payer: Medicare Other | Admitting: Certified Registered Nurse Anesthetist

## 2021-05-27 DIAGNOSIS — N3000 Acute cystitis without hematuria: Secondary | ICD-10-CM | POA: Diagnosis not present

## 2021-05-27 DIAGNOSIS — K635 Polyp of colon: Secondary | ICD-10-CM

## 2021-05-27 DIAGNOSIS — E1122 Type 2 diabetes mellitus with diabetic chronic kidney disease: Secondary | ICD-10-CM | POA: Diagnosis not present

## 2021-05-27 DIAGNOSIS — D62 Acute posthemorrhagic anemia: Secondary | ICD-10-CM

## 2021-05-27 DIAGNOSIS — D649 Anemia, unspecified: Secondary | ICD-10-CM | POA: Diagnosis not present

## 2021-05-27 DIAGNOSIS — K641 Second degree hemorrhoids: Secondary | ICD-10-CM

## 2021-05-27 DIAGNOSIS — K645 Perianal venous thrombosis: Secondary | ICD-10-CM

## 2021-05-27 DIAGNOSIS — N179 Acute kidney failure, unspecified: Secondary | ICD-10-CM | POA: Diagnosis not present

## 2021-05-27 HISTORY — PX: GIVENS CAPSULE STUDY: SHX5432

## 2021-05-27 HISTORY — PX: POLYPECTOMY: SHX5525

## 2021-05-27 HISTORY — PX: COLONOSCOPY WITH PROPOFOL: SHX5780

## 2021-05-27 LAB — CBC
HCT: 24.7 % — ABNORMAL LOW (ref 36.0–46.0)
Hemoglobin: 7.5 g/dL — ABNORMAL LOW (ref 12.0–15.0)
MCH: 23.7 pg — ABNORMAL LOW (ref 26.0–34.0)
MCHC: 30.4 g/dL (ref 30.0–36.0)
MCV: 78.2 fL — ABNORMAL LOW (ref 80.0–100.0)
Platelets: 275 10*3/uL (ref 150–400)
RBC: 3.16 MIL/uL — ABNORMAL LOW (ref 3.87–5.11)
RDW: 21.2 % — ABNORMAL HIGH (ref 11.5–15.5)
WBC: 9.2 10*3/uL (ref 4.0–10.5)
nRBC: 0.7 % — ABNORMAL HIGH (ref 0.0–0.2)

## 2021-05-27 LAB — BASIC METABOLIC PANEL
Anion gap: 7 (ref 5–15)
BUN: 9 mg/dL (ref 8–23)
CO2: 25 mmol/L (ref 22–32)
Calcium: 8.3 mg/dL — ABNORMAL LOW (ref 8.9–10.3)
Chloride: 107 mmol/L (ref 98–111)
Creatinine, Ser: 0.95 mg/dL (ref 0.44–1.00)
GFR, Estimated: 59 mL/min — ABNORMAL LOW (ref >60)
Glucose, Bld: 138 mg/dL — ABNORMAL HIGH (ref 70–99)
Potassium: 3.7 mmol/L (ref 3.5–5.1)
Sodium: 139 mmol/L (ref 135–145)

## 2021-05-27 LAB — GLUCOSE, CAPILLARY
Glucose-Capillary: 125 mg/dL — ABNORMAL HIGH (ref 70–99)
Glucose-Capillary: 124 mg/dL — ABNORMAL HIGH (ref 70–99)
Glucose-Capillary: 130 mg/dL — ABNORMAL HIGH (ref 70–99)
Glucose-Capillary: 143 mg/dL — ABNORMAL HIGH (ref 70–99)

## 2021-05-27 LAB — MAGNESIUM: Magnesium: 1.6 mg/dL — ABNORMAL LOW (ref 1.7–2.4)

## 2021-05-27 SURGERY — COLONOSCOPY WITH PROPOFOL
Anesthesia: Monitor Anesthesia Care

## 2021-05-27 MED ORDER — PHENYLEPHRINE 40 MCG/ML (10ML) SYRINGE FOR IV PUSH (FOR BLOOD PRESSURE SUPPORT)
PREFILLED_SYRINGE | INTRAVENOUS | Status: DC | PRN
  Administered 2021-05-27: 120 ug via INTRAVENOUS
  Administered 2021-05-27: 80 ug via INTRAVENOUS
  Administered 2021-05-27: 120 ug via INTRAVENOUS

## 2021-05-27 MED ORDER — PROPOFOL 10 MG/ML IV BOLUS
INTRAVENOUS | Status: DC | PRN
Start: 2021-05-27 — End: 2021-05-27
  Administered 2021-05-27: 20 mg via INTRAVENOUS
  Administered 2021-05-27: 10 mg via INTRAVENOUS

## 2021-05-27 MED ORDER — SODIUM CHLORIDE 0.9 % IV SOLN: INTRAVENOUS | Status: DC

## 2021-05-27 MED ORDER — PROPOFOL 500 MG/50ML IV EMUL
INTRAVENOUS | Status: DC | PRN
  Administered 2021-05-27: 75 ug/kg/min via INTRAVENOUS

## 2021-05-27 SURGICAL SUPPLY — 22 items

## 2021-05-27 NOTE — Progress Notes (Addendum)
?PROGRESS NOTE ? ?[name] JQZ:[id number] DOB: 11/15/1935 DOA: 05/24/2021 ?PCP: Aretta Nip, MD ? ? LOS: 2 days  ? ?Brief narrative: ? 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  She was noted to have hemoglobin of less than 5 in the primary care office and was sent to the hospital for further evaluation.  In the ED patient had stable vitals.  Occult was negative.  Iron was low at 18 with percent saturation of 4 and TIBC of 487.  Creatinine was mildly elevated at 1.5.  Repeat hemoglobin was 4.7.  EKG showed V paced rhythm.  Patient was ordered 2 units of packed RBC and was admitted to hospital for further evaluation and treatment.   ? ?During hospitalization, GI was consulted and patient underwent EGD on 05/26/2021 with findings of mild gastritis.  Colonoscopy was performed on 05/27/2021 with findings of polyps which were removed.  Plan for capsule endoscopy at this time. ? ?Assessment/Plan: ? ?Principal Problem: ?  Symptomatic anemia ?Active Problems: ?  Acute cystitis ?  Stage 3a chronic kidney disease (CKD) (HCC) - baseline SCr 1.2 ?  Acute renal failure superimposed on stage 3a chronic kidney disease ([place]) ?  Morbid obesity ([place]) ?  Essential hypertension ?  S/P TAVR (transcatheter aortic valve replacement) ?  History of DVT (deep vein thrombosis) ?  Diabetes mellitus ([place]) ?  History of cardiac pacemaker - due to complete heart block ?  DNR (do not resuscitate)/DNI(Do Not Intubate) ?  Lymphedema of both lower extremities ?  GI bleed ? ?Symptomatic anemia ?Patient had negative FOBT but significant anemia on presentation with initial hemoglobin of 4.7.  Received 3 units of packed  RBC.  Latest hemoglobin of 7.5.  Status post EGD on 05/26/2021 with gastritis.  Status post colonoscopy 05/27/2021 with polyps which were removed.  Plan of capsule endoscopy as per GI.  Continue PPI.  Check CBC in AM. ? ?Acute kidney injury on CKD stage IIIa. ?Creatinine today at 0.9, improved from 1.5.  Received IV fluids.  Blood pressure has improved.  Continue to monitor. ? ?Diabetes mellitus type 2 controlled with mild hyperglycemia.  We will continue with sliding scale insulin while in the hospital.  Latest POC glucose of 125 ? ?Acute cystitis.  Patient was diagnosed with UTI at PCP office.  Received Rocephin IV for 3 days. ? ?Bilateral lower extremity lymphedema with ulcers..  On lymphedema wraps.  Follows up in wound care clinic.  Wound care to follow in the hospital. ? ?History of complete heart block status post pacemaker. ? ?History of atrial fibrillation.  On apixaban as outpatient.  Currently on hold. ? ?History of DVT and pulmonary embolism. Hemoccult negative in the ED.  On Eliquis at home.  Hold Eliquis for now. Resume if negative and okay with GI.[name] Kitchen ? ?Essential hypertension.  We will continue to hold antihypertensives for now. ? ?Hypomagnesemia.  Magnesium of 1.6.  We will replenish.  Check levels in a.m. ? ?Obesity. ? ?DVT prophylaxis: SCDs Start: 05/24/21 2139 ? ?Code Status: DNR/DNI ? ?Family Communication:  ?Spoke with the patient's daughter at bedside on 05/26/2021 and 05/27/2021. ? ?Status is: Inpatient ? ?The patient is inpatient because: Status post colonoscopy endoscopy, significant anemia requiring blood transfusion, need  for capsule endoscopy ? ?Consultants: ?GI ? ?Procedures: ?PRBC transfusion 3 units ?EGD 05/26/2021.   ?Colonoscopy with polypectomy 05/27/2021 ? ?Anti-infectives: ? ?Rocephin IV 3/21> 3/23 ? ?Anti-infectives (From admission, onward)  ? ? Start     Dose/Rate Route Frequency Ordered Stop  ? 05/24/21 2015  cefTRIAXone (ROCEPHIN) 1 g in sodium chloride 0.9 % 100 mL IVPB       ? 1  g ?200 mL/hr over 30 Minutes Intravenous Every 24 hours 05/24/21 2012 05/26/21 2139  ? 05/24/21 1930  cephALEXin (KEFLEX) capsule 500 mg  Status:  Discontinued       ? 500 mg Oral 4 times daily 05/24/21 1927 05/24/21 2012  ? ?  ? ? ?Subjective: ?Today, patient was seen and examined at bedside.  Seen after colonoscopy evaluation.  Denies any pain, nausea, vomiting, shortness of breath or chest pain. ? ? ?Objective: ?Vitals:  ? 05/27/21 0845 05/27/21 0900  ?BP: (!) 118/57 121/79  ?Pulse: 84 84  ?Resp: (!) 21 (!) 22  ?Temp:  98.2 ?F (36.8 ?C)  ?SpO2: 93% 92%  ? ? ?Intake/Output Summary (Last 24 hours) at 05/27/2021 1100 ?Last data filed at 05/27/2021 0831 ?Gross per 24 hour  ?Intake 1330 ml  ?Output 600 ml  ?Net 730 ml  ? ? ?Filed Weights  ? 05/25/21 0911 05/27/21 0500  ?Weight: 111.1 kg 111 kg  ? ?Body mass index is 38.33 kg/m?.  ? ?Physical Exam: ? ?General: Obese built, not in obvious distress ?HENT: Mild pallor noted.  Oral mucosa is moist.  ?Chest:  Clear breath sounds.  Diminished breath sounds bilaterally. No crackles or wheezes.  ?CVS: S1 &S2 heard. No murmur.  Regular rate and rhythm. ?Abdomen: Soft, nontender, nondistended.  Bowel sounds are heard.   ?Extremities: No cyanosis, clubbing with bilateral lower extremity lymphedema.  Peripheral pulses are palpable. ?Psych: Alert, awake and oriented, normal mood ?CNS:  No cranial nerve deficits.  Power equal in all extremities.   ?Skin: Warm and dry.  Bilateral lower extremity lymphedema ? ?Data Review: I have personally reviewed the following laboratory data and studies, ? ?CBC: ?Recent Labs  ?Lab 05/24/21 ?1715 05/25/21 ?0104 05/25/21 ?9937 05/26/21 ?1696 05/27/21 ?0540  ?WBC 8.7  --  8.6 9.2 9.2  ?NEUTROABS 6.0  --  6.0  --   --   ?HGB 4.7* 6.2* 7.0* 7.3* 7.5*  ?HCT 17.6* 21.7* 24.1* 25.1* 24.7*  ?MCV 75.5*  --  78.5* 78.4* 78.2*  ?PLT 314  --  280 280 275  ? ? ?Basic Metabolic Panel: ?Recent Labs  ?Lab 05/24/21 ?1715 05/25/21 ?0628 05/26/21 ?7893 05/27/21 ?0540   ?NA 139 137 138 139  ?K 4.3 4.2 3.8 3.7  ?CL 109 107 108 107  ?CO2 19* '22 23 25  '$ ?GLUCOSE 115* 154* 163* 138*  ?BUN 29* 24* 15 9  ?CREATININE 1.59* 1.28* 1.02* 0.95  ?CALCIUM 8.8* 8.5* 8.3* 8.3*  ?MG  --  1.6* 1.6* 1.6*  ? ? ?Liver Function Tests: ?Recent Labs  ?Lab 05/24/21 ?1715 05/25/21 ?0628  ?AST 33 42*  ?ALT 27 22  ?ALKPHOS 61 58  ?BILITOT 0.8 1.1  ?PROT 6.2* 5.5*  ?ALBUMIN 3.0* 2.7*  ? ? ?No results for input(s): LIPASE, AMYLASE in the last 168 hours. ?No results for input(s): AMMONIA in the last 168 hours. ?Cardiac Enzymes: ?No results for input(s): CKTOTAL, CKMB, CKMBINDEX, TROPONINI in the last 168 hours. ?BNP (last 3 results) ?No results for input(s): BNP in the last 8760 hours. ? ?ProBNP (last 3 results) ?No  results for input(s): PROBNP in the last 8760 hours. ? ?CBG: ?Recent Labs  ?Lab 05/26/21 ?0904 05/26/21 ?1116 05/26/21 ?1531 05/26/21 ?2131 05/27/21 ?0830  ?GLUCAP 125* 197* 137* 127* 125*  ? ? ?No results found for this or any previous visit (from the past 240 hour(s)).  ? ?Studies: ?No results found. ? ? ? ?Flora Lipps, MD  ?[facility] ?05/27/2021  ?If 7PM-7AM, please contact night-coverage ? ? ? ? ? ? ?  ?

## 2021-05-27 NOTE — Plan of Care (Signed)
  Problem: Clinical Measurements: Goal: Respiratory complications will improve Outcome: Progressing   

## 2021-05-27 NOTE — Op Note (Signed)
[facility] ?Patient Name: [name] ?Procedure Date : 05/27/2021 ?MRN: [id number] ?Attending MD: [name] , MD ?Date of Birth: 06-Feb-1936 ?CSN: [id number] ?Age: 86 ?Admit Type: Inpatient ?Procedure:                Colonoscopy ?Indications:              Last colonoscopy: November 2017, Acute post  ?                          hemorrhagic anemia ?Providers:                [name], MD, Jeanella Cara, RN,  ?                          Gloris Ham, Technician, [employer]  ?                          Technician, Technician ?Referring MD:             hospital team ?Medicines:                Propofol per Anesthesia, Monitored Anesthesia Care ?Complications:            No immediate complications. ?Estimated Blood Loss:     Estimated blood loss was minimal. ?Procedure:                Pre-Anesthesia Assessment: ?                          - Prior to the procedure, a History and Physical  ?                          was performed, and patient medications and  ?                          allergies were reviewed. The patient's tolerance of  ?                          previous anesthesia was also reviewed. The risks  ?                          and benefits of the procedure and the sedation  ?                          options and risks were discussed with the patient.  ?                          All questions were answered, and informed consent  ?                          was obtained. Prior Anticoagulants: The patient has  ?                          taken Eliquis (apixaban), last dose was stopped at  ?  admission. ASA Grade Assessment: III - A patient  ?                          with severe systemic disease. After reviewing the  ?                          risks and benefits, the patient was deemed in  ?                          satisfactory condition to undergo the procedure. ?                          After obtaining informed consent, the colonoscope  ?                           was passed under direct vision. Throughout the  ?                          procedure, the patient's blood pressure, pulse, and  ?                          oxygen saturations were monitored continuously. The  ?                          [id number]-190TL ([id number] Olympus colonoscope was  ?                          introduced through the anus and advanced to the the  ?                          cecum, identified by appendiceal orifice and  ?                          ileocecal valve. The colonoscopy was somewhat  ?                          difficult due to significant looping. Successful  ?                          completion of the procedure was aided by  ?                          straightening and shortening the scope to obtain  ?                          bowel loop reduction. The patient tolerated the  ?                          procedure fairly well. The quality of the bowel  ?                          preparation was fair. The ileocecal valve,  ?  appendiceal orifice, and rectum were photographed. ?Scope In: 8:02:13 AM ?Scope Out: 8:21:18 AM ?Scope Withdrawal Time: 0 hours 14 minutes 18 seconds  ?Total Procedure Duration: 0 hours 19 minutes 5 seconds  ?Findings: ?     Two semi-sessile polyps were found in the cecum. The polyps were 1 to 4  ?     mm in size. These polyps were removed with a cold biopsy forceps.  ?     Resection and retrieval were complete. Estimated blood loss was minimal. ?     A 8 mm polyp was found in the ascending colon. The polyp was  ?     semi-sessile. The polyp was removed with a hot snare. Resection and  ?     retrieval were complete. Estimated blood loss: none. ?     Internal hemorrhoids were found during retroflexion. The hemorrhoids  ?     were large and Grade II (internal hemorrhoids that prolapse but reduce  ?     spontaneously). ?     The perianal exam findings include thrombosed external hemorrhoids. ?Impression:               - Preparation of the  colon was fair. ?                          - Two 1 to 4 mm polyps in the cecum, removed with a  ?                          cold biopsy forceps. Resected and retrieved. ?                          - One 8 mm polyp in the ascending colon, removed  ?                          with a hot snare. Resected and retrieved. ?                          - Internal hemorrhoids. ?                          - Thrombosed external hemorrhoids found on perianal  ?                          exam. ?Recommendation:           - NPO until placement of capsule endoscopy and then  ?                          diet per post-capsule recs. ?                          - Await pathology results. ?                          - Repeat colonoscopy for surveillance based on  ?                          pathology results. ?                          -  Timing on resuming Eliquis depends on capsule  ?                          endoscopy results. ?Procedure Code(s):        --- Professional --- ?                          [phone], Colonoscopy, flexible; with removal of  ?                          tumor(s), polyp(s), or other lesion(s) by snare  ?                          technique ?                          45380, 59, Colonoscopy, flexible; with biopsy,  ?                          single or multiple ?Diagnosis Code(s):        --- Professional --- ?                          D62, Acute posthemorrhagic anemia ?                          K63.5, Polyp of colon ?                          K64.5, Perianal venous thrombosis ?                          K64.1, Second degree hemorrhoids ?CPT copyright 2019 American Medical Association. All rights reserved. ?The codes documented in this report are preliminary and upon coder review may  ?be revised to meet current compliance requirements. ?[name], MD ?05/27/2021 9:12:00 AM ?This report has been signed electronically. ?Number of Addenda: 0 ?

## 2021-05-27 NOTE — Progress Notes (Signed)
Capsule endoscopy performed per order of MD Schooler. Pt ingested capsule at 0854. [facility] capsule instructions provided to pt. Pt expressed understanding.  ? ?Per [facility] capsule instructions, pt to remain NPO until 1054. Pt may advance to clear liquids with no red color at that time. Pt may have a small snack at 1254. Pt may return to ordered diet at 1654.  ? ?Capsule study will end at 2054 at which time the leads may be removed. Endoscopy staff will pick up the leads and recorder tomorrow AM. ? ?Debarah Crape, RN ?05/27/21 ?9:00 AM ? ?

## 2021-05-27 NOTE — Anesthesia Preprocedure Evaluation (Signed)
Anesthesia Evaluation  ?Patient identified by MRN, date of birth, ID band ?Patient awake ? ? ? ?Reviewed: ?Allergy & Precautions, NPO status , Patient's Chart, lab work & pertinent test results ? ?Airway ?Mallampati: II ? ?TM Distance: >3 FB ?Neck ROM: Full ? ? ? Dental ? ?(+) Upper Dentures ?  ?Pulmonary ?former smoker, PE ?  ?Pulmonary exam normal ? ? ? ? ? ? ? Cardiovascular ?hypertension, Pt. on medications ?+ pacemaker + Valvular Problems/Murmurs (s/p TAVR 2019) AS  ?Rhythm:Regular Rate:Normal ? ? ?  ?Neuro/Psych ?Anxiety negative neurological ROS ?   ? GI/Hepatic ?Neg liver ROS, PUD, GERD  Medicated,  ?Endo/Other  ?diabetes, Type 2, Oral Hypoglycemic Agents ? Renal/GU ?Renal InsufficiencyRenal disease  ? ?  ?Musculoskeletal ? ?(+) Arthritis , Osteoarthritis,   ? Abdominal ?Normal abdominal exam  (+)   ?Peds ? Hematology ? ?(+) Blood dyscrasia, anemia ,   ?Anesthesia Other Findings ? ? Reproductive/Obstetrics ? ?  ? ? ? ? ? ? ? ? ? ? ? ? ? ?  ?  ? ? ? ? ? ? ? ? ?Anesthesia Physical ? ?Anesthesia Plan ? ?ASA: 3 ? ?Anesthesia Plan: MAC  ? ?Post-op Pain Management:   ? ?Induction: Intravenous ? ?PONV Risk Score and Plan: 2 and Propofol infusion and Treatment may vary due to age or medical condition ? ?Airway Management Planned: Simple Face Mask, Natural Airway and Nasal Cannula ? ?Additional Equipment: None ? ?Intra-op Plan:  ? ?Post-operative Plan:  ? ?Informed Consent: I have reviewed the patients History and Physical, chart, labs and discussed the procedure including the risks, benefits and alternatives for the proposed anesthesia with the patient or authorized representative who has indicated his/her understanding and acceptance.  ? ? ? ?Dental advisory given ? ?Plan Discussed with: CRNA ? ?Anesthesia Plan Comments: (Lab Results ?     Component                Value               Date                 ?     WBC                      8.6                 05/25/2021           ?      HGB                      7.0 (L)             05/25/2021           ?     HCT                      24.1 (L)            05/25/2021           ?     MCV                      78.5 (L)            05/25/2021           ?     PLT  280                 05/25/2021           ?Lab Results ?     Component                Value               Date                 ?     NA                       137                 05/25/2021           ?     K                        4.2                 05/25/2021           ?     CO2                      22                  05/25/2021           ?     GLUCOSE                  154 (H)             05/25/2021           ?     BUN                      24 (H)              05/25/2021           ?     CREATININE               1.28 (H)            05/25/2021           ?     CALCIUM                  8.5 (L)             05/25/2021           ?     EGFR                     45 (L)              11/22/2020           ?     GFRNONAA                 42 (L)              05/25/2021           ?ECHO: ??1. Left ventricular ejection fraction, by estimation, is 60 to 65%. The  ?left ventricle has normal function. The left ventricle has no regional  ?wall motion abnormalities. There is moderate concentric left ventricular  ?hypertrophy. Left ventricular  ?diastolic parameters are consistent with Grade I diastolic dysfunction  ?(impaired relaxation). Elevated left atrial  pressure.  ??2. Right ventricular systolic function is normal. The right ventricular  ?size is normal. There is normal pulmonary artery systolic pressure.  ??3. Left atrial size was moderately dilated.  ??4. Right atrial size was mildly dilated.  ??5. The mitral valve is degenerative. Mild mitral valve regurgitation.  ?Mild to moderate mitral stenosis. The mean mitral valve gradient is 7.2  ?mmHg. Severe mitral annular calcification.  ??6. The aortic valve has been repaired/replaced. Aortic valve  ?regurgitation is not visualized. Mild aortic valve  stenosis. There is a 26  ?mm Sapien prosthetic (TAVR) valve present in the aortic position.  ?Procedure Date: 07/17/2017. Aortic valve mean  ?gradient measures 17.5 mmHg. Aortic valve Vmax measures 2.91 m/s.  ??7. The inferior vena cava is normal in size with greater than 50%  ?respiratory variability, suggesting right atrial pressure of 3 mmHg.  ? ?Comparison(s): No significant change from prior study. Prior images  ?reviewed side by side. )  ? ? ? ? ? ? ?Anesthesia Quick Evaluation ? ?

## 2021-05-27 NOTE — Interval H&P Note (Signed)
History and Physical Interval Note: ? ?05/27/2021 ?7:49 AM ? ?[name]  has presented today for surgery, with the diagnosis of anemia.  The various methods of treatment have been discussed with the patient and family. After consideration of risks, benefits and other options for treatment, the patient has consented to  Procedure(s): ?COLONOSCOPY WITH PROPOFOL (N/A) as a surgical intervention.  The patient's history has been reviewed, patient examined, no change in status, stable for surgery.  I have reviewed the patient's chart and labs.  Questions were answered to the patient's satisfaction.   ? ? ?[name] ? ? ?

## 2021-05-27 NOTE — Anesthesia Postprocedure Evaluation (Signed)
Anesthesia Post Note ? ?Patient: Marybel Inthavong ? ?Procedure(s) Performed: COLONOSCOPY WITH PROPOFOL ?POLYPECTOMY ?GIVENS CAPSULE STUDY ? ?  ? ?Patient location during evaluation: PACU ?Anesthesia Type: MAC ?Level of consciousness: awake and alert ?Pain management: pain level controlled ?Vital Signs Assessment: post-procedure vital signs reviewed and stable ?Respiratory status: spontaneous breathing, nonlabored ventilation and respiratory function stable ?Cardiovascular status: blood pressure returned to baseline and stable ?Postop Assessment: no apparent nausea or vomiting ?Anesthetic complications: no ? ? ?No notable events documented. ? ?Last Vitals:  ?Vitals:  ? 05/27/21 0845 05/27/21 0900  ?BP: (!) 118/57 121/79  ?Pulse: 84 84  ?Resp: (!) 21 (!) 22  ?Temp:  36.8 ?C  ?SpO2: 93% 92%  ?  ?Last Pain:  ?Vitals:  ? 05/27/21 0900  ?TempSrc:   ?PainSc: 0-No pain  ? ? ?  ?  ?  ?  ?  ?  ? ?Lynda Rainwater ? ? ? ? ?

## 2021-05-27 NOTE — Transfer of Care (Signed)
Immediate Anesthesia Transfer of Care Note ? ?Patient: [name] ? ?Procedure(s) Performed: COLONOSCOPY WITH PROPOFOL ?POLYPECTOMY ? ?Patient Location: PACU ? ?Anesthesia Type:MAC ? ?Level of Consciousness: awake, alert  and oriented ? ?Airway & Oxygen Therapy: Patient Spontanous Breathing and Patient connected to face mask oxygen ? ?Post-op Assessment: Report given to RN and Post -op Vital signs reviewed and stable ? ?Post vital signs: Reviewed and stable ? ?Last Vitals:  ?Vitals Value Taken Time  ?BP 112/74   ?Temp    ?Pulse 94 05/27/21 0830  ?Resp 9 05/27/21 0830  ?SpO2 95 % 05/27/21 0830  ?Vitals shown include unvalidated device data. ? ?Last Pain:  ?Vitals:  ? 05/27/21 0714  ?TempSrc: Temporal  ?PainSc: 0-No pain  ?   ? ?Patients Stated Pain Goal: 0 (05/26/21 2100) ? ?Complications: No notable events documented. ?

## 2021-05-28 DIAGNOSIS — D649 Anemia, unspecified: Secondary | ICD-10-CM | POA: Diagnosis not present

## 2021-05-28 DIAGNOSIS — N3000 Acute cystitis without hematuria: Secondary | ICD-10-CM | POA: Diagnosis not present

## 2021-05-28 DIAGNOSIS — N179 Acute kidney failure, unspecified: Secondary | ICD-10-CM | POA: Diagnosis not present

## 2021-05-28 DIAGNOSIS — E1122 Type 2 diabetes mellitus with diabetic chronic kidney disease: Secondary | ICD-10-CM | POA: Diagnosis not present

## 2021-05-28 LAB — BASIC METABOLIC PANEL
Anion gap: 7 (ref 5–15)
BUN: 7 mg/dL — ABNORMAL LOW (ref 8–23)
CO2: 23 mmol/L (ref 22–32)
Calcium: 8.3 mg/dL — ABNORMAL LOW (ref 8.9–10.3)
Chloride: 108 mmol/L (ref 98–111)
Creatinine, Ser: 1.13 mg/dL — ABNORMAL HIGH (ref 0.44–1.00)
GFR, Estimated: 48 mL/min — ABNORMAL LOW (ref >60)
Glucose, Bld: 121 mg/dL — ABNORMAL HIGH (ref 70–99)
Potassium: 3.9 mmol/L (ref 3.5–5.1)
Sodium: 138 mmol/L (ref 135–145)

## 2021-05-28 LAB — CBC
HCT: 25 % — ABNORMAL LOW (ref 36.0–46.0)
Hemoglobin: 7.2 g/dL — ABNORMAL LOW (ref 12.0–15.0)
MCH: 23.2 pg — ABNORMAL LOW (ref 26.0–34.0)
MCHC: 28.8 g/dL — ABNORMAL LOW (ref 30.0–36.0)
MCV: 80.4 fL (ref 80.0–100.0)
Platelets: 270 10*3/uL (ref 150–400)
RBC: 3.11 MIL/uL — ABNORMAL LOW (ref 3.87–5.11)
RDW: 22.3 % — ABNORMAL HIGH (ref 11.5–15.5)
WBC: 8.7 10*3/uL (ref 4.0–10.5)
nRBC: 1.2 % — ABNORMAL HIGH (ref 0.0–0.2)

## 2021-05-28 LAB — GLUCOSE, CAPILLARY
Glucose-Capillary: 148 mg/dL — ABNORMAL HIGH (ref 70–99)
Glucose-Capillary: 125 mg/dL — ABNORMAL HIGH (ref 70–99)
Glucose-Capillary: 150 mg/dL — ABNORMAL HIGH (ref 70–99)
Glucose-Capillary: 161 mg/dL — ABNORMAL HIGH (ref 70–99)

## 2021-05-28 LAB — MAGNESIUM: Magnesium: 1.6 mg/dL — ABNORMAL LOW (ref 1.7–2.4)

## 2021-05-28 MED ORDER — APIXABAN 5 MG PO TABS
5.0000 mg | ORAL_TABLET | Freq: Two times a day (BID) | ORAL | Status: DC
  Administered 2021-05-28 – 2021-05-29 (×3): 5 mg via ORAL
  Filled 2021-05-28 (×3): qty 1

## 2021-05-28 NOTE — Progress Notes (Signed)
?PROGRESS NOTE ? ?[name] KWI:[id number] DOB: 1935/05/11 DOA: 05/24/2021 ?PCP: Aretta Nip, MD ? ? LOS: 3 days  ? ?Brief narrative: ? 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  She was noted to have hemoglobin of less than 5 in the primary care office and was sent to the hospital for further evaluation.  In the ED patient had stable vitals.  Occult was negative.  Iron was low at 18 with percent saturation of 4 and TIBC of 487.  Creatinine was mildly elevated at 1.5.  Repeat hemoglobin was 4.7.  EKG showed V paced rhythm.  Patient was ordered 2 units of packed RBC and was admitted to hospital for further evaluation and treatment.   ? ?During hospitalization, GI was consulted and patient underwent EGD on 05/26/2021 with findings of mild gastritis.  Colonoscopy was performed on 05/27/2021 with findings of polyps which were removed.  Patient then underwent capsule endoscopy with findings of few nonbleeding small bowel AVMs out of reach of enteroscopy. ? ?Assessment/Plan: ? ?Principal Problem: ?  Symptomatic anemia ?Active Problems: ?  Acute cystitis ?  Stage 3a chronic kidney disease (CKD) (HCC) - baseline SCr 1.2 ?  Acute renal failure superimposed on stage 3a chronic kidney disease ([place]) ?  Morbid obesity ([place]) ?  Essential hypertension ?  S/P TAVR (transcatheter aortic valve replacement) ?  History of DVT (deep vein thrombosis) ?  Diabetes mellitus ([place]) ?  History of cardiac pacemaker - due to complete heart block ?  DNR (do not resuscitate)/DNI(Do Not Intubate) ?  Lymphedema of both lower extremities ?  GI bleed ? ?Symptomatic anemia ?Patient had negative FOBT but significant  anemia on presentation with initial hemoglobin of 4.7.  Received 3 units of packed RBC.  Latest hemoglobin of 7.5.  Status post EGD on 05/26/2021 with gastritis.  Status post colonoscopy 05/27/2021 with polyps which were removed.  Status post  capsule endoscopy of AVM in the small bowel not amenable to enteroscopy.  Spoke with Dr. Hubbard [facility] who recommends initiating Eliquis at this time.  If continues to bleed patient might need double balloon enteroscopy at a tertiary center.  Check CBC in AM. ? ?Acute kidney injury on CKD stage IIIa. ?Creatinine today at 1.1, improved from 1.5.  Received IV fluids initially. Blood pressure has improved.  Continue to monitor. ? ?Diabetes mellitus type 2 controlled with mild hyperglycemia.  We will continue with sliding scale insulin while in the hospital.  Latest POC glucose of 125 ? ?Acute cystitis.  Patient was diagnosed with UTI at PCP office.  Completed Rocephin IV for 3 days. ? ?Bilateral lower extremity lymphedema with ulcers..  On lymphedema wraps.  Follows up in wound care clinic.  Wound care to follow in the hospital. ? ?History of complete heart block status post pacemaker. ? ?History of atrial fibrillation.  On apixaban as outpatient.  Currently on hold. ? ?History of DVT and pulmonary embolism. Hemoccult negative in the ED.  On Eliquis at home.  Spoke with GI and okay for Eliquis resumption at this time. ? ?Essential hypertension.  We will continue to hold antihypertensives.  Blood pressure of 114/ 55 at this time. ? ?Hypomagnesemia.  Magnesium of 1.6.  We will continue to replace  orally. ? ?Obesity. ? ?Disposition.  Home likely by tomorrow if hemoglobin remains stable .  We will initiate Eliquis today. ? ?DVT prophylaxis: SCDs Start: 05/24/21 2139 ? ?Code Status: DNR/DNI ? ?Family Communication:  ?Spoke with the patient's daughter at bedside on 05/26/2021 and 05/27/2021. ? ?Status is: Inpatient ? ?The patient is inpatient because: Status post colonoscopy/endoscopy,  significant anemia requiring blood transfusion, status post capsule endoscopy ? ?Consultants: ?GI ? ?Procedures: ?PRBC transfusion 3 units ?EGD 05/26/2021.   ?Colonoscopy with polypectomy 05/27/2021 ?Capsule endoscopy 05/27/2021 ? ?Anti-infectives: ? ?Rocephin IV 3/21> 3/23 ? ?Anti-infectives (From admission, onward)  ? ? Start     Dose/Rate Route Frequency Ordered Stop  ? 05/24/21 2015  cefTRIAXone (ROCEPHIN) 1 g in sodium chloride 0.9 % 100 mL IVPB       ? 1 g ?200 mL/hr over 30 Minutes Intravenous Every 24 hours 05/24/21 2012 05/26/21 2139  ? 05/24/21 1930  cephALEXin (KEFLEX) capsule 500 mg  Status:  Discontinued       ? 500 mg Oral 4 times daily 05/24/21 1927 05/24/21 2012  ? ?  ? ?Subjective: ?Today, patient was seen and examined at bedside.  Patient denies any nausea vomiting abdominal pain shortness of breath fever.  Has not had a bowel movement after the procedure.  Has had solid food.  ? ?Objective: ?Vitals:  ? 05/28/21 0333 05/28/21 1159  ?BP: 110/89 (!) 114/55  ?Pulse: 70 75  ?Resp: 20 20  ?Temp: 99.3 ?F (37.4 ?C) 98.5 ?F (36.9 ?C)  ?SpO2:  96%  ? ? ?Intake/Output Summary (Last 24 hours) at 05/28/2021 1331 ?Last data filed at 05/27/2021 2200 ?Gross per 24 hour  ?Intake 160.54 ml  ?Output --  ?Net 160.54 ml  ? ? ?Filed Weights  ? 05/25/21 0911 05/27/21 0500 05/28/21 0333  ?Weight: 111.1 kg 111 kg 109.8 kg  ? ?Body mass index is 37.91 kg/m?.  ? ?Physical Exam: ? ?General: Obese built, not in obvious distress ?HENT:   Mild pallor noted.  Oral mucosa is moist.  ?Chest:  Clear breath sounds.  Diminished breath sounds bilaterally. No crackles or wheezes.  ?CVS: S1 &S2 heard. No murmur.  Regular rate and rhythm. ?Abdomen: Soft, nontender, nondistended.  Bowel sounds are heard.   ?Extremities: No cyanosis, clubbing with bilateral lower extremity lymphedema peripheral pulses are palpable. ?Psych: Alert, awake and oriented, normal mood ?CNS:  No cranial nerve deficits.  Power equal in all extremities.   ?Skin: Warm and  dry.  Bilateral lower extremity lymphedema. ? ?Data Review: I have personally reviewed the following laboratory data and studies, ? ?CBC: ?Recent Labs  ?Lab 05/24/21 ?1715 05/25/21 ?0104 05/25/21 ?5638 05/26/21 ?7564 05/27/21 ?3329 05/28/21 ?0258  ?WBC 8.7  --  8.6 9.2 9.2 8.7  ?NEUTROABS 6.0  --  6.0  --   --   --   ?HGB 4.7* 6.2* 7.0* 7.3* 7.5* 7.2*  ?HCT 17.6* 21.7* 24.1* 25.1* 24.7* 25.0*  ?MCV 75.5*  --  78.5* 78.4* 78.2* 80.4  ?PLT 314  --  280 280 275 270  ? ? ?Basic Metabolic Panel: ?Recent Labs  ?Lab 05/24/21 ?1715 05/25/21 ?0628 05/26/21 ?5188 05/27/21 ?4166 05/28/21 ?0258  ?NA 139 137 138 139 138  ?K 4.3 4.2 3.8 3.7 3.9  ?CL 109 107 108 107 108  ?CO2 19* '22 23 25 23  '$ ?GLUCOSE 115* 154* 163* 138* 121*  ?BUN 29* 24* 15 9 7*  ?CREATININE 1.59* 1.28* 1.02* 0.95 1.13*  ?CALCIUM 8.8* 8.5* 8.3* 8.3* 8.3*  ?MG  --  1.6* 1.6* 1.6* 1.6*  ? ? ?Liver Function Tests: ?Recent Labs  ?Lab 05/24/21 ?1715 05/25/21 ?0628  ?AST 33 42*  ?ALT 27 22  ?ALKPHOS 61 58  ?BILITOT 0.8 1.1  ?PROT 6.2* 5.5*  ?ALBUMIN 3.0* 2.7*  ? ? ?No results for input(s): LIPASE, AMYLASE in the last 168 hours. ?No results for input(s): AMMONIA in the last 168 hours. ?Cardiac Enzymes: ?No results for input(s): CKTOTAL, CKMB, CKMBINDEX, TROPONINI in the last 168 hours. ?BNP (last 3 results) ?No results for input(s): BNP in the last 8760 hours. ? ?ProBNP (last 3 results) ?No results for input(s): PROBNP in the last 8760 hours. ? ?CBG: ?Recent Labs  ?Lab 05/27/21 ?1150 05/27/21 ?1638 05/27/21 ?2052 05/28/21 ?0744 05/28/21 ?1156  ?GLUCAP 124* 130* 143* 148* 125*  ? ? ?No results found for this or any previous visit (from the past 240 hour(s)).  ? ?Studies: ?No results found. ? ? ? ?Flora Lipps, MD  ?[facility] ?05/28/2021  ?If 7PM-7AM, please contact night-coverage ? ? ? ? ? ? ?  ?

## 2021-05-28 NOTE — Progress Notes (Addendum)
Patient ID: [name], female   DOB: March 26, 1935, 86 y.o.   MRN: [id number] ? ?Denies abdominal pain/N/V. Excited about being able to eat solid food.  ? ?Gen - alert, no acute distress, well-nourished, pleasant ?HEENT - anicteric sclera ? ? ?Capsule endoscopy report read and showed a few nonbleeding small bowel AVMs likely out of reach of enteroscope. No active bleeding. Continue iron supplementation. Ok to resume Eliquis. If anemia worsens, then may need referral to tertiary care center for double balloon enteroscopy otherwise manage conservatively. Will sign off. Call if questions. ? ?Time: 45 minutes on care, counseling, and documentation. ?

## 2021-05-28 NOTE — Evaluation (Signed)
Physical Therapy Evaluation ?Patient Details ?Name: [name] ?MRN: [id number] ?DOB: 1935/04/07 ?Today's Date: 05/28/2021 ? ?History of Present Illness ? 86 y/o female presented to ED on 05/24/21 for low Hgb 4.5, UTI, and legs are swollen. S/p EGD on 3/23 and colonscopy on 3/24 with unremarkable results. S/p capsule endoscopy on 3/24 with few nonbleeding small bowel AVMs. PMH: HTN, T2DM, morbid obesity, hx of DVT/PE, s/p TAVR, hx of complete heart block s/p pacemaker, hx of breast cancer, hx of ovarian cancer.  ?Clinical Impression ? Pt presents to PT at or near her recent functional baseline. Pt reports a gradual decline in mobility over the last few years, limited to transfers and scooting around the home on her rollator due to OA and weakness. Pt will benefit from continued acute PT services to aide in improving LE strength and reducing falls risk during household mobility. Continued LE exercise could aide in improving strength and balance while reducing arthritic related pain. PT recommends discharge home with HHPT.   ?   ? ?Recommendations for follow up therapy are one component of a multi-disciplinary discharge planning process, led by the attending physician.  Recommendations may be updated based on patient status, additional functional criteria and insurance authorization. ? ?Follow Up Recommendations Home health PT ? ?  ?Assistance Recommended at Discharge Intermittent Supervision/Assistance  ?Patient can return home with the following ? A little help with walking and/or transfers;A little help with bathing/dressing/bathroom;Assistance with cooking/housework;Assist for transportation ? ?  ?Equipment Recommendations None recommended by PT  ?Recommendations for Other Services ?    ?  ?Functional Status Assessment Patient has had a recent decline in their functional status and demonstrates the ability to make significant improvements in function in a reasonable and predictable amount of time. (slow gradual  decline)  ? ?  ?Precautions / Restrictions Precautions ?Precautions: Fall ?Restrictions ?Weight Bearing Restrictions: No  ? ?  ? ?Mobility ? Bed Mobility ?  ?  ?  ?  ?  ?  ?  ?  ?  ? ?Transfers ?Overall transfer level: Needs assistance ?Equipment used: Rollator (4 wheels) ?Transfers: Sit to/from Stand, Bed to chair/wheelchair/BSC ?Sit to Stand: Supervision ?Stand pivot transfers: Supervision ?  ?  ?  ?  ?General transfer comment: pt performs 3 stand pivot transfers from rollator to various surfaces ?  ? ?Ambulation/Gait ?Ambulation/Gait assistance:  (pt scootson rollator for ~20' at a modI level) ?  ?  ?  ?  ?  ?  ?  ? ?Stairs ?  ?  ?  ?  ?  ? ?Wheelchair Mobility ?  ? ?Modified Rankin (Stroke Patients Only) ?  ? ?  ? ?Balance Overall balance assessment: Needs assistance ?Sitting-balance support: No upper extremity supported, Feet supported ?Sitting balance-Leahy Scale: Good ?  ?  ?Standing balance support: Single extremity supported, Bilateral upper extremity supported, Reliant on assistive device for balance ?Standing balance-Leahy Scale: Poor ?  ?  ?  ?  ?  ?  ?  ?  ?  ?  ?  ?  ?   ? ? ? ?Pertinent Vitals/Pain Pain Assessment ?Pain Assessment: Faces ?Faces Pain Scale: Hurts little more ?Pain Location: multipls joints, chronic pain ?Pain Descriptors / Indicators: Aching ?Pain Intervention(s): Monitored during session  ? ? ?Home Living Family/patient expects to be discharged to:: Private residence ?Living Arrangements: Other relatives ?Available Help at Discharge: Family;Available PRN/intermittently ?Type of Home: House ?Home Access: Level entry ?  ?  ?Alternate Level Stairs-Number of Steps: stair lift ?Home Layout:  Multi-level ?Home Equipment: Rollator (4 wheels);Transport chair ?   ?  ?Prior Function Prior Level of Function : Needs assist ?  ?  ?  ?  ?  ?  ?Mobility Comments: pt performs stand pivot transfers to rollator and scoots around sitting on rollators. Chair lift to move to different floors of home ?ADLs  Comments: aide assists with bathing and cleaning ?  ? ? ?Hand Dominance  ? Dominant Hand: Right ? ?  ?Extremity/Trunk Assessment  ? Upper Extremity Assessment ?Upper Extremity Assessment: Generalized weakness;LUE deficits/detail ?LUE Deficits / Details: LUE shoulder flexion limited to ~100 degrees ?  ? ?Lower Extremity Assessment ?Lower Extremity Assessment: Generalized weakness ?  ? ?Cervical / Trunk Assessment ?Cervical / Trunk Assessment: Kyphotic  ?Communication  ? Communication: No difficulties  ?Cognition Arousal/Alertness: Awake/alert ?Behavior During Therapy: [facility] for tasks assessed/performed ?Overall Cognitive Status: Within Functional Limits for tasks assessed ?  ?  ?  ?  ?  ?  ?  ?  ?  ?  ?  ?  ?  ?  ?  ?  ?  ?  ?  ? ?  ?General Comments General comments (skin integrity, edema, etc.): VSS on RA ? ?  ?Exercises    ? ?Assessment/Plan  ?  ?PT Assessment Patient needs continued PT services  ?PT Problem List Decreased strength;Decreased range of motion;Decreased balance;Decreased activity tolerance;Decreased mobility ? ?   ?  ?PT Treatment Interventions DME instruction;Gait training;Functional mobility training;Therapeutic exercise;Therapeutic activities;Balance training;Patient/family education   ? ?PT Goals (Current goals can be found in the Care Plan section)  ?Acute Rehab PT Goals ?Patient Stated Goal: to return home and maintain functional mobility ?PT Goal Formulation: With patient ?Time For Goal Achievement: 06/11/21 ?Potential to Achieve Goals: Fair ? ?  ?Frequency Min 2X/week ?  ? ? ?Co-evaluation   ?  ?  ?  ?  ? ? ?  ?AM-PAC PT "6 Clicks" Mobility  ?Outcome Measure Help needed turning from your back to your side while in a flat bed without using bedrails?: A Little ?Help needed moving from lying on your back to sitting on the side of a flat bed without using bedrails?: A Little ?Help needed moving to and from a bed to a chair (including a wheelchair)?: A Little ?Help needed standing up from a chair  using your arms (e.g., wheelchair or bedside chair)?: A Little ?Help needed to walk in hospital room?: Total ?Help needed climbing 3-5 steps with a railing? : Total ?6 Click Score: 14 ? ?  ?End of Session   ?Activity Tolerance: Patient tolerated treatment well ?Patient left: in bed;with call bell/phone within reach ?Nurse Communication: Mobility status ?PT Visit Diagnosis: Other abnormalities of gait and mobility (R26.89);Muscle weakness (generalized) (M62.81) ?  ? ?Time: 3419-6222 ?PT Time Calculation (min) (ACUTE ONLY): 30 min ? ? ?Charges:   PT Evaluation ?$PT Eval Low Complexity: 1 Low ?  ?  ?   ? ? ?Zenaida Niece, PT, DPT ?Acute Rehabilitation ?Pager: [phone] ?Office [phone] ? ? ?Zenaida Niece ?05/28/2021, 5:34 PM ? ?

## 2021-05-29 ENCOUNTER — Encounter (HOSPITAL_COMMUNITY): Payer: Self-pay | Admitting: Gastroenterology

## 2021-05-29 DIAGNOSIS — N3000 Acute cystitis without hematuria: Secondary | ICD-10-CM | POA: Diagnosis not present

## 2021-05-29 DIAGNOSIS — D649 Anemia, unspecified: Secondary | ICD-10-CM | POA: Diagnosis not present

## 2021-05-29 DIAGNOSIS — E1122 Type 2 diabetes mellitus with diabetic chronic kidney disease: Secondary | ICD-10-CM | POA: Diagnosis not present

## 2021-05-29 DIAGNOSIS — K922 Gastrointestinal hemorrhage, unspecified: Secondary | ICD-10-CM

## 2021-05-29 DIAGNOSIS — N179 Acute kidney failure, unspecified: Secondary | ICD-10-CM | POA: Diagnosis not present

## 2021-05-29 DIAGNOSIS — E669 Obesity, unspecified: Secondary | ICD-10-CM

## 2021-05-29 LAB — BASIC METABOLIC PANEL
Anion gap: 7 (ref 5–15)
BUN: 12 mg/dL (ref 8–23)
CO2: 25 mmol/L (ref 22–32)
Calcium: 8.6 mg/dL — ABNORMAL LOW (ref 8.9–10.3)
Chloride: 107 mmol/L (ref 98–111)
Creatinine, Ser: 1.04 mg/dL — ABNORMAL HIGH (ref 0.44–1.00)
GFR, Estimated: 53 mL/min — ABNORMAL LOW (ref >60)
Glucose, Bld: 145 mg/dL — ABNORMAL HIGH (ref 70–99)
Potassium: 3.6 mmol/L (ref 3.5–5.1)
Sodium: 139 mmol/L (ref 135–145)

## 2021-05-29 LAB — CBC
HCT: 24.3 % — ABNORMAL LOW (ref 36.0–46.0)
Hemoglobin: 7.2 g/dL — ABNORMAL LOW (ref 12.0–15.0)
MCH: 23.5 pg — ABNORMAL LOW (ref 26.0–34.0)
MCHC: 29.6 g/dL — ABNORMAL LOW (ref 30.0–36.0)
MCV: 79.4 fL — ABNORMAL LOW (ref 80.0–100.0)
Platelets: 268 10*3/uL (ref 150–400)
RBC: 3.06 MIL/uL — ABNORMAL LOW (ref 3.87–5.11)
RDW: 22.4 % — ABNORMAL HIGH (ref 11.5–15.5)
WBC: 9 10*3/uL (ref 4.0–10.5)
nRBC: 0.7 % — ABNORMAL HIGH (ref 0.0–0.2)

## 2021-05-29 LAB — GLUCOSE, CAPILLARY
Glucose-Capillary: 148 mg/dL — ABNORMAL HIGH (ref 70–99)
Glucose-Capillary: 170 mg/dL — ABNORMAL HIGH (ref 70–99)

## 2021-05-29 LAB — MAGNESIUM: Magnesium: 1.6 mg/dL — ABNORMAL LOW (ref 1.7–2.4)

## 2021-05-29 MED ORDER — VITAMIN C 250 MG PO TABS: 250.0000 mg | ORAL_TABLET | Freq: Every day | ORAL | Status: AC

## 2021-05-29 NOTE — Discharge Summary (Addendum)
?Physician Discharge Summary ?  ?Patient: [name] MRN: [id number] DOB: 07/07/1935  ?Admit date:     05/24/2021  ?Discharge date: 05/29/21  ?Discharge Physician: Corrie Mckusick Terita Hejl  ? ?PCP: Rankins, Bill Salinas, MD  ? ?Recommendations at discharge:  ? ?Follow-up with your primary care provider in 1 week.  Check CBC at that time.  If downgoing trend, patient might need to be referred to tertiary care center for double-balloon enteroscopy. ?Patient is on Eliquis for history of recurrent DV/ PE.  We will need to closely monitor. ? ?Discharge Diagnoses: ?Principal Problem: ?  Symptomatic anemia ?Active Problems: ?  Acute cystitis ?  Stage 3a chronic kidney disease (CKD) (HCC) - baseline SCr 1.2 ?  Acute renal failure superimposed on stage 3a chronic kidney disease ([place]) ?  Obesity (BMI 30-39.9) ?  Essential hypertension ?  S/P TAVR (transcatheter aortic valve replacement) ?  History of DVT (deep vein thrombosis) ?  Diabetes mellitus ([place]) ?  History of cardiac pacemaker - due to complete heart block ?  DNR (do not resuscitate)/DNI(Do Not Intubate) ?  Lymphedema of both lower extremities ?  GI bleed ? ?Resolved Problems: ?  * No resolved hospital problems. * ? ?Hospital Course: ? 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  She was noted to have hemoglobin of less than 5 in the primary care office and was sent to the hospital for further evaluation.  In the ED patient had stable vitals.  Occult was negative.  Iron was low at 18 with percent saturation of 4 and TIBC of 487.  Creatinine was mildly elevated at 1.5.  Repeat hemoglobin was 4.7.  EKG showed V paced rhythm.  Patient was ordered 2 units of  packed RBC and was admitted to hospital for further evaluation and treatment.   ?  ?During hospitalization, GI was consulted and patient underwent EGD on 05/26/2021 with findings of mild gastritis.  Colonoscopy was performed on 05/27/2021 with findings of polyps which were removed.  Patient then underwent capsule endoscopy with findings of few nonbleeding small bowel AVMs out of reach of enteroscopy. ? ?Assessment and Plan: ? ?Symptomatic anemia, exact cause clinically undetermined. ?Patient had negative FOBT but significant anemia on presentation with initial hemoglobin of 4.7.  Received 3 units of packed RBC.  Latest hemoglobin of 7.2 and has remained stable.  Status post EGD on 05/26/2021 with gastritis.  Status post colonoscopy 05/27/2021 with polyps which were removed.  Status post  capsule endoscopy with AVM in the small bowel not amenable to enteroscopy.  GI has seen the patient and patient has been started on Eliquis.  If continues to have downgoing trend of anemia patient might need double balloon enteroscopy at a tertiary center /periodic transfusions.  Spoke with the patient's family about it.  Recommend checking CBC in 1 week. ?  ?Acute kidney injury on CKD stage IIIa. ?Improved.  Latest creatinine of 1.0.  Received IV fluids initially.  Check BMP as outpatient. ?  ?Diabetes mellitus type 2 controlled with mild hyperglycemia.  Received a sliding scale during hospitalization.  On metformin at home. ? ?Acute cystitis.  Patient was diagnosed with UTI at PCP office.  Completed Rocephin IV for 3 days. ?  ?Bilateral lower extremity lymphedema  with ulcers..  On lymphedema wraps.  Follows up in wound care clinic.  ?  ?History of complete heart block status post pacemaker. ?  ?History of atrial fibrillation.  On apixaban as outpatient.  She has been resumed on discharge. ?  ?History of DVT and pulmonary embolism.  Will be resumed on Eliquis on discharge ? ?Essential hypertension.  On olmesartan at  home. ? ?Hypomagnesemia.  Magnesium of 1.6.  Encouraged to take magnesium orally on discharge ?  ?Obesity.  Would benefit from weight loss as outpatient. ?  ?Consultants: GI ? ?Procedures performed:  ?PRBC transfusion 3 units ?EGD 05/26/2021.   ?Colonoscopy with polypectomy 05/27/2021 ?Capsule endoscopy 05/27/2021 ?  ?Disposition: Home.  With home health.  I spoke with the patient's daughter on the phone and updated her about the clinical condition of the patient and the plan for follow-up. ? ?Diet recommendation:  ?Discharge Diet Orders (From admission, onward)  ? ?  Start     Ordered  ? 05/29/21 0000  Diet - low sodium heart healthy       ? 05/29/21 0954  ? ?  ?  ? ?  ? ?Cardiac diet ?DISCHARGE MEDICATION: ?Allergies as of 05/29/2021   ? ?   Reactions  ? Nsaids Other (See Comments)  ? Peptic ulcer  ? Tape Rash  ? CLEAR PLASTIC TAPE  ? ?  ? ?  ?Medication List  ?  ? ?STOP taking these medications   ? ?Shingrix injection ?Generic drug: Zoster Vaccine Adjuvanted ?  ? ?  ? ?TAKE these medications   ? ?acetaminophen 650 MG CR tablet ?Commonly known as: TYLENOL ?Take 1,300 mg by mouth in the morning and at bedtime. ?  ?apixaban 5 MG Tabs tablet ?Commonly known as: ELIQUIS ?Take 1 tablet (5 mg total) by mouth 2 (two) times daily. ?  ?atorvastatin 20 MG tablet ?Commonly known as: LIPITOR ?Take 20 mg by mouth at bedtime. ?  ?b complex vitamins capsule ?Take 1 capsule by mouth daily. ?  ?CALCIUM CITRATE + PO ?Take 1 tablet by mouth 2 (two) times daily. ?  ?cholecalciferol 1000 units tablet ?Commonly known as: VITAMIN D ?Take 1,000 Units by mouth 2 (two) times daily. ?  ?clotrimazole 1 % cream ?Commonly known as: LOTRIMIN ?Apply to scales on lower extremities once daily until resolved ?  ?ferrous sulfate 325 (65 FE) MG tablet ?Take 325 mg by mouth every evening. ?  ?fish oil-omega-3 fatty acids 1000 MG capsule ?Take 1 g by mouth every evening. ?  ?FLUoxetine 40 MG capsule ?Commonly known as: PROZAC ?Take 40 mg by mouth daily. ?   ?furosemide 40 MG tablet ?Commonly known as: LASIX ?TAKE 1 TABLET TWICE DAILY  ON MONDAY, WEDNESDAY,  FRIDAY, &amp; SUNDAY AND 1 TAB  ONCE DAILY ON TUESDAY,  THURSDAY,&amp; SATURDAY ?What changed:  ?how much to take ?how to take this ?when to take this ?additional instructions ?  ?Levemir FlexTouch 100 UNIT/ML FlexPen ?Generic drug: insulin detemir ?Inject 60-70 Units into the skin 2 (two) times daily. Per sliding scale ?  ?Magnesium 400 MG Caps ?Take 400 mg by mouth at bedtime. ?  ?metFORMIN 1000 MG tablet ?Commonly known as: GLUCOPHAGE ?Take 1,000 mg by mouth 2 (two) times daily with a meal. ?  ?multivitamin tablet ?Take 1 tablet by mouth daily. ?  ?olmesartan 5 MG tablet ?Commonly known as: BENICAR ?Take 5 mg by mouth daily. ?  ?pantoprazole 40 MG tablet ?Commonly known as: PROTONIX ?Take 40 mg by mouth 2 (two)  times daily. ?What changed: Another medication with the same name was removed. Continue taking this medication, and follow the directions you see here. ?  ?saccharomyces boulardii 250 MG capsule ?Commonly known as: Florastor ?Take 1 capsule (250 mg total) by mouth 2 (two) times daily. You can find a probiotic over the counter. Take this while you are on antibiotics. ?  ?vitamin C 250 MG tablet ?Commonly known as: ASCORBIC ACID ?Take 1 tablet (250 mg total) by mouth daily. ?  ? ?  ? ?  ?  ? ? ?  ?Discharge Care Instructions  ?(From admission, onward)  ?  ? ? ?  ? ?  Start     Ordered  ? 05/29/21 0000  Discharge wound care:       ?Comments: Continue at the wound care clinic.  ? 05/29/21 0954  ? ?  ?  ? ?  ?  ? ? Follow-up Information   ? ? Rankins, Bill Salinas, MD Follow up in 1 week(s).   ?Specialty: Family Medicine ?Contact information: ?[address] ?[place] [address] ?[phone] ? ? ?  ?  ? ? Skeet Latch, MD .   ?Specialty: Cardiology ?Contact information: ?[address] ?[address] ?[place] [address] ?[phone] ? ? ?  ?  ? ? [facility] Follow up.   ?Specialty: Home  Health Services ?Why: HHRN/PT services resumed- they wil contact you to schedule again ?Contact information: ?[address] ?[address] ?[place] [address] ?[phone] ? ? ?  ?  ? ?  ?  ? ?  ? ?Subjective ?Today, patient

## 2021-05-29 NOTE — Evaluation (Addendum)
Occupational Therapy Evaluation ?Patient Details ?Name: [name] ?MRN: [id number] ?DOB: 28-Jan-1936 ?Today's Date: 05/29/2021 ? ? ?History of Present Illness 86 y/o female presented to ED on 05/24/21 for low Hgb 4.5, UTI, and legs are swollen. S/p EGD on 3/23 and colonscopy on 3/24 with unremarkable results. S/p capsule endoscopy on 3/24 with few nonbleeding small bowel AVMs. PMH: HTN, T2DM, morbid obesity, hx of DVT/PE, s/p TAVR, hx of complete heart block s/p pacemaker, hx of breast cancer, hx of ovarian cancer.  ? ?Clinical Impression ?  ?PTA, pt was living with her sister and was requiring assistance for bathing and IADLs; has an aide to assist. Currently, pt requires Min A for UB ADLs, Max A for LB ADLs, and Supervision for transfers with rollator. Pt presenting with decreased balance, strength, and activity tolerance compared to baseline. Pt planning for dc today and continued OT can be met through [facility] services. Recommend dc to home with HHOT for further OT to optimize safety, independence with ADLs, and return to PLOF.  ?   ? ?Recommendations for follow up therapy are one component of a multi-disciplinary discharge planning process, led by the attending physician.  Recommendations may be updated based on patient status, additional functional criteria and insurance authorization.  ? ?Follow Up Recommendations ? Home health OT  ?  ?Assistance Recommended at Discharge Intermittent Supervision/Assistance  ?Patient can return home with the following A little help with walking and/or transfers;A little help with bathing/dressing/bathroom ? ?  ?Functional Status Assessment ? Patient has had a recent decline in their functional status and demonstrates the ability to make significant improvements in function in a reasonable and predictable amount of time.  ?Equipment Recommendations ? None recommended by OT  ?  ?Recommendations for Other Services   ? ? ?  ?Precautions / Restrictions Precautions ?Precautions:  Fall ?Restrictions ?Weight Bearing Restrictions: No  ? ?  ? ?Mobility Bed Mobility ?  ?  ?  ?  ?  ?  ?  ?General bed mobility comments: Sitting at EOB ?  ? ?Transfers ?Overall transfer level: Needs assistance ?Equipment used: Rollator (4 wheels) ?Transfers: Sit to/from Stand, Bed to chair/wheelchair/BSC ?Sit to Stand: Supervision ?Stand pivot transfers: Supervision ?  ?  ?  ?  ?General transfer comment: pt performs 3 stand pivot transfers from rollator to various surfaces ?  ? ?  ?Balance Overall balance assessment: Needs assistance ?Sitting-balance support: No upper extremity supported, Feet supported ?Sitting balance-Leahy Scale: Good ?  ?  ?Standing balance support: Single extremity supported, Bilateral upper extremity supported, Reliant on assistive device for balance ?Standing balance-Leahy Scale: Poor ?  ?  ?  ?  ?  ?  ?  ?  ?  ?  ?  ?  ?   ? ?ADL either performed or assessed with clinical judgement  ? ?ADL Overall ADL's : Needs assistance/impaired ?Eating/Feeding: Set up;Sitting ?  ?Grooming: Set up;Sitting ?  ?Upper Body Bathing: Minimal assistance;Sitting ?  ?Lower Body Bathing: Maximal assistance;Sit to/from stand ?  ?Upper Body Dressing : Minimal assistance;Sitting ?Upper Body Dressing Details (indicate cue type and reason): donning gown ?Lower Body Dressing: Maximal assistance;Sit to/from stand ?Lower Body Dressing Details (indicate cue type and reason): Pt describing how she dons LB clothing with use of AE. Pt presenting with decreased standing tolerance during evalution ?Toilet Transfer: Supervision/safety;Stand-pivot ?  ?  ?  ?  ?  ?Functional mobility during ADLs: Supervision/safety ?General ADL Comments: Pt very motivated and pleasant. Presenting with decreased strength compared to  baseline. Pt requring increased time and supervision for transfer from EOB>rollator>recliner  ? ? ? ?Vision   ?   ?   ?Perception   ?  ?Praxis   ?  ? ?Pertinent Vitals/Pain Pain Assessment ?Pain Assessment: No/denies pain   ? ? ? ?Hand Dominance Right ?  ?Extremity/Trunk Assessment Upper Extremity Assessment ?Upper Extremity Assessment: Generalized weakness;LUE deficits/detail ?LUE Deficits / Details: LUE shoulder flexion limited to ~100 degrees ?  ?Lower Extremity Assessment ?Lower Extremity Assessment: Generalized weakness ?  ?Cervical / Trunk Assessment ?Cervical / Trunk Assessment: Kyphotic ?  ?Communication Communication ?Communication: No difficulties ?  ?Cognition Arousal/Alertness: Awake/alert ?Behavior During Therapy: [facility] for tasks assessed/performed ?Overall Cognitive Status: Within Functional Limits for tasks assessed ?  ?  ?  ?  ?  ?  ?  ?  ?  ?  ?  ?  ?  ?  ?  ?  ?  ?  ?  ?General Comments  VSS on RA ? ?  ?Exercises   ?  ?Shoulder Instructions    ? ? ?Home Living Family/patient expects to be discharged to:: Private residence ?Living Arrangements: Other relatives (Niece and sister) ?Available Help at Discharge: Family;Available PRN/intermittently ?Type of Home: House ?Home Access: Level entry ?  ?  ?Home Layout: Multi-level ?Alternate Level Stairs-Number of Steps: stair lift ?  ?Bathroom Shower/Tub: Walk-in shower ?  ?Bathroom Toilet: Handicapped height ?  ?  ?Home Equipment: Rollator (4 wheels);Transport chair;Shower seat;Grab bars - tub/shower ?  ?  ?  ? ?  ?Prior Functioning/Environment Prior Level of Function : Needs assist ?  ?  ?  ?  ?  ?  ?Mobility Comments: pt performs stand pivot transfers to rollator and scoots around sitting on rollators. Chair lift to move to different floors of home ?ADLs Comments: aide assists with bathing and cleaning. Uses reacher for LB dressing. Wear clogs. ?  ? ?  ?  ?OT Problem List: Decreased activity tolerance;Impaired balance (sitting and/or standing);Decreased strength ?  ?   ?OT Treatment/Interventions:    ?  ?OT Goals(Current goals can be found in the care plan section) Acute Rehab OT Goals ?Patient Stated Goal: Go home today ?OT Goal Formulation: All assessment and education  complete, DC therapy  ?OT Frequency:   ?  ? ?Co-evaluation   ?  ?  ?  ?  ? ?  ?AM-PAC OT "6 Clicks" Daily Activity     ?Outcome Measure Help from another person eating meals?: None ?Help from another person taking care of personal grooming?: A Little ?Help from another person toileting, which includes using toliet, bedpan, or urinal?: A Little ?Help from another person bathing (including washing, rinsing, drying)?: A Lot ?Help from another person to put on and taking off regular upper body clothing?: A Little ?Help from another person to put on and taking off regular lower body clothing?: A Lot ?6 Click Score: 17 ?  ?End of Session Equipment Utilized During Treatment: Rollator (4 wheels) ?Nurse Communication: Mobility status ? ?Activity Tolerance: Patient tolerated treatment well ?Patient left: in chair;with call bell/phone within reach ? ?OT Visit Diagnosis: Unsteadiness on feet (R26.81);Other abnormalities of gait and mobility (R26.89);Muscle weakness (generalized) (M62.81)  ?              ?Time: 1610-9604 ?OT Time Calculation (min): 25 min ?Charges:  OT General Charges ?$OT Visit: 1 Visit ?OT Evaluation ?$OT Eval Low Complexity: 1 Low ?OT Treatments ?$Self Care/Home Management : 8-22 mins ? ?Yuvia Plant MSOT, OTR/L ?Acute  Rehab ?Pager: [phone] ?Office: [phone] ? ?Honore Wipperfurth M Dajia Gunnels ?05/29/2021, 11:58 AM ?

## 2021-05-29 NOTE — TOC Transition Note (Signed)
Transition of Care (TOC) - CM/SW Discharge Note ?Marvetta Gibbons Therapist, sports, BSN ?Transitions of Care ?Unit 4E- RN Case Manager ?See Treatment Team for direct phone #  ?Weekend Cross Coverage for 6E ? ?Patient Details  ?Name: [name] ?MRN: [id number] ?Date of Birth: 1935-04-10 ? ?Transition of Care (TOC) CM/SW Contact:  ?Dahlia Client, Romeo Rabon, RN ?Phone Number: ?05/29/2021, 11:11 AM ? ? ?Clinical Narrative:    ?Pt stable for transition home today, Orders placed for [facility] needs.  ?CM in to speak with pt at bedside, per pt she has [facility] currently but can not remember name- list provided Per CMS guidelines from medicare.gov website with star ratings (copy placed in shadow chart), reviewed in [facility] system which shows pt is active with [facility]- pt agreeable to remain with them.  ? ?Per pt she has needed DME at home- no new DME needs at this time. Pt will have transportation home once she is able to contact them.  ? ?Address, phone # and PCP all confirmed in epic.  ? ?Call made to [facility]- spoke w/ Alwyn Ren- confirmed pt is active with them for HHRN/PT services- orders in for resumption- [facility] will contact pt to resume services on discharge.  ? ? ?Final next level of care: [facility] ?Barriers to Discharge: No Barriers Identified ? ? ?Patient Goals and CMS Choice ?Patient states their goals for this hospitalization and ongoing recovery are:: return home ?CMS Medicare.gov Compare Post Acute Care list provided to:: Patient ?Choice offered to / list presented to : Patient ? ?Discharge Placement ?  ?          Home w/ HH ?  ?  ?  ?  ? ?Discharge Plan and Services ?  ?Discharge Planning Services: CM Consult ?Post Acute Care Choice: Home Health, Resumption of Svcs/PTA Provider          ?DME Arranged: N/A ?DME Agency: NA ?  ?  ?  ?HH Arranged: RN, PT ?[place] Agency: [facility] ?Date HH Agency Contacted: 05/29/21 ?Time [place]: 5009 ?Representative spoke with at [place]: Alwyn Ren ? ?Social  Determinants of Health (SDOH) Interventions ?  ? ? ?Readmission Risk Interventions ? ?  05/29/2021  ? 11:05 AM  ?Readmission Risk Prevention Plan  ?Transportation Screening Complete  ?PCP or Specialist Appt within 5-7 Days Complete  ?Home Care Screening Complete  ?Medication Review (RN CM) Complete  ? ? ? ? ? ?

## 2021-05-30 ENCOUNTER — Encounter (HOSPITAL_COMMUNITY): Payer: Self-pay | Admitting: Gastroenterology

## 2021-05-30 LAB — SURGICAL PATHOLOGY

## 2021-05-31 ENCOUNTER — Other Ambulatory Visit: Payer: Self-pay

## 2021-05-31 ENCOUNTER — Encounter (HOSPITAL_BASED_OUTPATIENT_CLINIC_OR_DEPARTMENT_OTHER): Payer: Medicare Other | Admitting: Internal Medicine

## 2021-05-31 DIAGNOSIS — Z9221 Personal history of antineoplastic chemotherapy: Secondary | ICD-10-CM | POA: Diagnosis not present

## 2021-05-31 DIAGNOSIS — Z794 Long term (current) use of insulin: Secondary | ICD-10-CM | POA: Diagnosis not present

## 2021-05-31 DIAGNOSIS — Z7984 Long term (current) use of oral hypoglycemic drugs: Secondary | ICD-10-CM | POA: Diagnosis not present

## 2021-05-31 DIAGNOSIS — I890 Lymphedema, not elsewhere classified: Secondary | ICD-10-CM | POA: Diagnosis not present

## 2021-05-31 DIAGNOSIS — Z950 Presence of cardiac pacemaker: Secondary | ICD-10-CM | POA: Diagnosis not present

## 2021-05-31 DIAGNOSIS — E11622 Type 2 diabetes mellitus with other skin ulcer: Secondary | ICD-10-CM

## 2021-05-31 DIAGNOSIS — L97822 Non-pressure chronic ulcer of other part of left lower leg with fat layer exposed: Secondary | ICD-10-CM | POA: Diagnosis not present

## 2021-05-31 DIAGNOSIS — Z952 Presence of prosthetic heart valve: Secondary | ICD-10-CM | POA: Diagnosis not present

## 2021-05-31 DIAGNOSIS — I10 Essential (primary) hypertension: Secondary | ICD-10-CM | POA: Diagnosis not present

## 2021-05-31 DIAGNOSIS — E11621 Type 2 diabetes mellitus with foot ulcer: Secondary | ICD-10-CM | POA: Diagnosis not present

## 2021-05-31 DIAGNOSIS — M1990 Unspecified osteoarthritis, unspecified site: Secondary | ICD-10-CM | POA: Diagnosis not present

## 2021-05-31 DIAGNOSIS — L97812 Non-pressure chronic ulcer of other part of right lower leg with fat layer exposed: Secondary | ICD-10-CM | POA: Diagnosis not present

## 2021-05-31 NOTE — Progress Notes (Addendum)
[name], [name] ([id number]) ?Visit Report for 05/31/2021 ?Arrival Information Details ?Patient Name: Date of Service: ?[name], [place] NN 05/31/2021 10:15 A M ?Medical Record Number: [id number] ?Patient Account Number: [id number] ?Date of Birth/Sex: Treating RN: ?25-Sep-1935 (86 y.o. [name] ?Primary Care Teyonna Plaisted: Aretta Nip Other Clinician: ?Referring Anaja Monts: ?Treating Grethel Zenk/Extender: Kalman Shan ?[place], [place] ?Weeks in Treatment: 1 ?Visit Information History Since Last Visit ?Added or deleted any medications: Yes ?Patient Arrived: Wheel Chair ?Any new allergies or adverse reactions: No ?Arrival Time: 10:24 ?Had a fall or experienced change in No ?Accompanied By: Niece ?activities of daily living that may affect ?Transfer Assistance: None ?risk of falls: ?Patient Identification Verified: Yes ?Signs or symptoms of abuse/neglect since last visito No ?Secondary Verification Process Completed: Yes ?Hospitalized since last visit: Yes ?Patient Requires Transmission-Based Precautions: No ?Implantable device outside of the clinic excluding No ?Patient Has Alerts: Yes ?cellular tissue based products placed in the center ?Patient Alerts: Patient on Blood Thinner since last visit: ?Eliquis Has Dressing in Place as Prescribed: Yes ?Has Compression in Place as Prescribed: Yes ?Pain Present Now: No ?Electronic Signature(s) ?Signed: 05/31/2021 4:30:08 PM By: Lorrin Jackson ?Entered By: Lorrin Jackson on 05/31/2021 10:31:07 ?-------------------------------------------------------------------------------- ?Compression Therapy Details ?Patient Name: Date of Service: ?[name], [place] NN 05/31/2021 10:15 A M ?Medical Record Number: [id number] ?Patient Account Number: [id number] ?Date of Birth/Sex: Treating RN: ?Dec 02, 1935 (86 y.o. [name] ?Primary Care Ebin Palazzi: Aretta Nip Other Clinician: ?Referring Rubby Barbary: ?Treating Zyliah Schier/Extender: Kalman Shan ?[place], [place] ?Weeks in  Treatment: 1 ?Compression Therapy Performed for Wound Assessment: Wound #1 Left,Circumferential Lower Leg ?Performed By: Clinician Lorrin Jackson, RN ?Compression Type: Three Layer ?Post Procedure Diagnosis ?Same as Pre-procedure ?Electronic Signature(s) ?Signed: 05/31/2021 4:30:08 PM By: Lorrin Jackson ?Entered By: Lorrin Jackson on 05/31/2021 11:18:29 ?-------------------------------------------------------------------------------- ?Compression Therapy Details ?Patient Name: ?Date of Service: ?[name], [place] NN 05/31/2021 10:15 A M ?Medical Record Number: [id number] ?Patient Account Number: [id number] ?Date of Birth/Sex: ?Treating RN: ?[birth year] (86 y.o. [name] ?Primary Care Mendell Bontempo: Milagros Evener R ?Other Clinician: ?Referring Effrey Davidow: ?Treating Crystal Ellwood/Extender: Kalman Shan ?[place], [place] ?Weeks in Treatment: 1 ?Compression Therapy Performed for Wound Assessment: Wound #2 Right,Circumferential Lower Leg ?Performed By: Clinician Lorrin Jackson, RN ?Compression Type: Three Layer ?Post Procedure Diagnosis ?Same as Pre-procedure ?Electronic Signature(s) ?Signed: 05/31/2021 4:30:08 PM By: Lorrin Jackson ?Entered By: Lorrin Jackson on 05/31/2021 11:18:29 ?-------------------------------------------------------------------------------- ?Encounter Discharge Information Details ?Patient Name: ?Date of Service: ?[name], [place] NN 05/31/2021 10:15 A M ?Medical Record Number: [id number] ?Patient Account Number: [id number] ?Date of Birth/Sex: ?Treating RN: ?1935-05-01 (86 y.o. [name] ?Primary Care Lila Lufkin: Milagros Evener R ?Other Clinician: ?Referring Katena Petitjean: ?Treating Anay Rathe/Extender: Kalman Shan ?[place], [place] ?Weeks in Treatment: 1 ?Encounter Discharge Information Items ?Discharge Condition: Stable ?Ambulatory Status: Wheelchair ?Discharge Destination: Home ?Transportation: Private Auto ?Accompanied By: Niece ?Schedule Follow-up Appointment: Yes ?Clinical Summary of Care:  Provided on 05/31/2021 ?Form Type Recipient ?Paper Patient Patient ?Electronic Signature(s) ?Signed: 05/31/2021 4:30:08 PM By: Lorrin Jackson ?Entered By: Lorrin Jackson on 05/31/2021 11:22:40 ?-------------------------------------------------------------------------------- ?Lower Extremity Assessment Details ?Patient Name: ?Date of Service: ?[name], [place] NN 05/31/2021 10:15 A M ?Medical Record Number: [id number] ?Patient Account Number: [id number] ?Date of Birth/Sex: ?Treating RN: ?1935/04/04 (86 y.o. [name] ?Primary Care Yong Wahlquist: Milagros Evener R ?Other Clinician: ?Referring Sabriel Borromeo: ?Treating Twania Bujak/Extender: Kalman Shan ?[place], [place] ?Weeks in Treatment: 1 ?Edema Assessment ?Assessed: [Left: Yes] [Right: Yes] ?Edema: [Left: Yes] [Right: Yes] ?Calf ?Left: Right: ?Point of Measurement: 33 cm  From Medial Instep 45 cm 41.3 cm ?Ankle ?Left: Right: ?Point of Measurement: 9 cm From Medial Instep 25.5 cm 25 cm ?Vascular Assessment ?Pulses: ?Dorsalis Pedis ?Palpable: [Left:Yes] [Right:Yes] ?Electronic Signature(s) ?Signed: 05/31/2021 4:30:08 PM By: Lorrin Jackson ?Entered By: Lorrin Jackson on 05/31/2021 10:38:35 ?-------------------------------------------------------------------------------- ?Multi Wound Chart Details ?Patient Name: ?Date of Service: ?[name], [place] NN 05/31/2021 10:15 A M ?Medical Record Number: [id number] ?Patient Account Number: [id number] ?Date of Birth/Sex: ?Treating RN: ?February 20, 1936 (86 y.o. F) ?Primary Care Kanaya Gunnarson: Milagros Evener R ?Other Clinician: ?Referring Renia Mikelson: ?Treating Matix Henshaw/Extender: Kalman Shan ?[place], [place] ?Weeks in Treatment: 1 ?Vital Signs ?Height(in): 67 ?Capillary Blood Glucose(mg/dl): 170 ?Weight(lbs): 260 ?Pulse(bpm): 60 ?Body Mass Index(BMI): 40.7 ?Blood Pressure(mmHg): 112/70 ?Temperature(??F): 99.2 ?Respiratory Rate(breaths/min): 18 ?Photos: [N/A:N/A] ?Left, Circumferential Lower Leg Right, Circumferential Lower Leg N/A ?Wound  Location: ?Gradually Appeared Gradually Appeared N/A ?Wounding Event: ?Diabetic Wound/Ulcer of the Lower Diabetic Wound/Ulcer of the Lower N/A ?Primary Etiology: ?Extremity Extremity ?Lymphedema Lymphedema N/A ?Secondary Etiology: ?Lymphedema, Hypertension, Type II Lymphedema, Hypertension, Type II N/A ?Comorbid History: ?Diabetes, Osteoarthritis, Received Diabetes, Osteoarthritis, Received ?Chemotherapy Chemotherapy ?04/06/2018 04/06/2018 N/A ?Date Acquired: ?1 1 N/A ?Weeks of Treatment: ?Open Open N/A ?Wound Status: ?No No N/A ?Wound Recurrence: ?6x3x0.1 8x9x0.1 N/A ?Measurements L x W x D (cm) ?14.137 56.549 N/A ?A (cm?) : ?rea ?1.414 5.655 N/A ?Volume (cm?) : ?96.70% 91.30% N/A ?% Reduction in A rea: ?96.70% 91.30% N/A ?% Reduction in Volume: ?Grade 2 Grade 2 N/A ?Classification: ?Medium Medium N/A ?Exudate A mount: ?Serous Serous N/A ?Exudate Type: ?Physiological scientist N/A ?Exudate Color: ?Distinct, outline attached Distinct, outline attached N/A ?Wound Margin: ?Large (67-100%) Large (67-100%) N/A ?Granulation A mount: ?Red Red N/A ?Granulation Quality: ?Small (1-33%) Small (1-33%) N/A ?Necrotic A mount: ?Fat Layer (Subcutaneous Tissue): Yes Fat Layer (Subcutaneous Tissue): Yes N/A ?Exposed Structures: ?Fascia: No ?Fascia: No ?Tendon: No ?Tendon: No ?Muscle: No ?Muscle: No ?Joint: No ?Joint: No ?Bone: No ?Bone: No ?None Medium (34-66%) N/A ?Epithelialization: ?Compression Therapy Compression Therapy N/A ?Procedures Performed: ?Treatment Notes ?Wound #1 (Lower Leg) Wound Laterality: Left, Circumferential ?Cleanser ?Soap and Water ?Discharge Instruction: May shower and wash wound with dial antibacterial soap and water prior to dressing change. ?Wound Cleanser ?Discharge Instruction: Cleanse the wound with wound cleanser prior to applying a clean dressing using gauze sponges, not tissue or cotton balls. ?Peri-Wound Care ?Triamcinolone 15 (g) ?Discharge Instruction: Use triamcinolone 15 (g) as directed ?Sween Lotion  (Moisturizing lotion) ?Discharge Instruction: Apply moisturizing lotion as directed ?Topical ?Primary Dressing ?KerraCel Ag Gelling Fiber Dressing, 4x5 in (silver alginate) ?Discharge Instruction: Apply silver alginate to

## 2021-05-31 NOTE — Progress Notes (Addendum)
ELYSSE, POLIDORE ([id number]) ?Visit Report for 05/31/2021 ?Chief Complaint Document Details ?Patient Name: Date of Service: ?[name], [name] 05/31/2021 10:15 A M ?Medical Record Number: [id number] ?Patient Account Number: [id number] ?Date of Birth/Sex: Treating RN: ?May 12, 1935 (86 y.o. F) ?Primary Care Provider: Aretta Nip Other Clinician: ?Referring Provider: ?Treating Provider/Extender: Kalman Shan ?[place], [place] ?Weeks in Treatment: 1 ?Information Obtained from: Patient ?Chief Complaint ?Bilateral lower extremity wounds ?Electronic Signature(s) ?Signed: 05/31/2021 11:45:20 AM By: Kalman Shan DO ?Entered By: Kalman Shan on 05/31/2021 11:39:41 ?-------------------------------------------------------------------------------- ?HPI Details ?Patient Name: Date of Service: ?[name], [name] 05/31/2021 10:15 A M ?Medical Record Number: [id number] ?Patient Account Number: [id number] ?Date of Birth/Sex: Treating RN: ?1935/11/11 (86 y.o. F) ?Primary Care Provider: Aretta Nip Other Clinician: ?Referring Provider: ?Treating Provider/Extender: Kalman Shan ?[place], [place] ?Weeks in Treatment: 1 ?History of Present Illness ?HPI Description: Admission 05/20/2021 ?[name] is an 86 year old female with a past medical history of insulin-dependent type 2 diabetes, complete heart block with pacemaker, and ?lymphedema that presents the clinic for a several month history of weeping to her legs bilaterally. She was referred to our clinic by Dr. Adah Perl from [facility]. Patient reports a several year history of waxing and waning to her bilateral lower extremity weeping. She has been treated with Unna boots in ?the past. She has compression stockings but has a hard time putting these on. She does not have lymphedema pumps. She currently denies signs of ?infection. ?3/28; patient presents for follow-up. She has home health that has been changing the compression wraps. She has  tolerated these well. She denies signs of ?infection. She has no issues or complaints today. ?Electronic Signature(s) ?Signed: 05/31/2021 11:45:20 AM By: Kalman Shan DO ?Entered By: Kalman Shan on 05/31/2021 11:40:17 ?-------------------------------------------------------------------------------- ?Physical Exam Details ?Patient Name: Date of Service: ?[name], [name] 05/31/2021 10:15 A M ?Medical Record Number: [id number] ?Patient Account Number: [id number] ?Date of Birth/Sex: Treating RN: ?[birth year] (86 y.o. F) ?Primary Care Provider: Aretta Nip Other Clinician: ?Referring Provider: ?Treating Provider/Extender: Kalman Shan ?[place], [place] ?Weeks in Treatment: 1 ?Constitutional ?respirations regular, non-labored and within target range for patient.[name] Kitchen ?Cardiovascular ?2+ dorsalis pedis/posterior tibialis pulses. ?Psychiatric ?pleasant and cooperative. ?Notes ?Bilateral lower extremity cobblestoning with lymphedema skin changes and minimal weeping. Good edema control. No surrounding signs of infection. ?Electronic Signature(s) ?Signed: 05/31/2021 11:45:20 AM By: Kalman Shan DO ?Entered By: Kalman Shan on 05/31/2021 11:41:02 ?-------------------------------------------------------------------------------- ?Physician Orders Details ?Patient Name: Date of Service: ?[name], [name] 05/31/2021 10:15 A M ?Medical Record Number: [id number] ?Patient Account Number: [id number] ?Date of Birth/Sex: Treating RN: ?[birth year] (86 y.o. [name] ?Primary Care Provider: Aretta Nip Other Clinician: ?Referring Provider: ?Treating Provider/Extender: Kalman Shan ?[place], [place] ?Weeks in Treatment: 1 ?Verbal / Phone Orders: No ?Diagnosis Coding ?ICD-10 Coding ?Code Description ?I89.0 Lymphedema, not elsewhere classified ?G92.119 Non-pressure chronic ulcer of other part of right lower leg with fat layer exposed ?E17.408 Non-pressure chronic ulcer of other part of left lower leg  with fat layer exposed ?E11.622 Type 2 diabetes mellitus with other skin ulcer ?Follow-up Appointments ?Return appointment in 3 weeks. - Dr. Heber [place] [facility], room 7) ?Bathing/ Shower/ Hygiene ?May shower with protection but do not get wound dressing(s) wet. - when not changing the dressings. ?May shower and wash wound with soap and water. - with dressing changes. ?Edema Control - Lymphedema / SCD / Other ?Elevate legs to the level of the heart or above for 30  minutes daily and/or when sitting, a frequency of: - 3-4 times a throughout the day and ?while sitting elevate. ?Avoid standing for long periods of time. ?Exercise regularly ?Home Health ?No change in wound care orders this week; continue Home Health for wound care. May utilize formulary equivalent dressing for wound ?treatment orders unless otherwise specified. ?Other Home Health Orders/Instructions: - [place] ?Wound Treatment ?Wound #1 - Lower Leg Wound Laterality: Left, Circumferential ?Cleanser: Soap and Water (Home Health) 3 x Per Week/30 Days ?Discharge Instructions: May shower and wash wound with dial antibacterial soap and water prior to dressing change. ?Cleanser: Wound Cleanser (Home Health) 3 x Per Week/30 Days ?Discharge Instructions: Cleanse the wound with wound cleanser prior to applying a clean dressing using gauze sponges, not tissue or cotton balls. ?Peri-Wound Care: Triamcinolone 15 (g) 3 x Per Week/30 Days ?Discharge Instructions: Use triamcinolone 15 (g) as directed ?Peri-Wound Care: Sween Lotion (Moisturizing lotion) (Home Health) 3 x Per Week/30 Days ?Discharge Instructions: [place] may use Eucerin Cream ?Prim Dressing: KerraCel Ag Gelling Fiber Dressing, 4x5 in (silver alginate) (Home Health) 3 x Per Week/30 Days ?ary ?Discharge Instructions: Apply silver alginate to wound bed as instructed ?Secondary Dressing: ABD Pad, 8x10 (Home Health) 3 x Per Week/30 Days ?Discharge Instructions: Apply over primary dressing as  directed. ?Compression Wrap: ThreePress (3 layer compression wrap) (Home Health) 3 x Per Week/30 Days ?Discharge Instructions: Apply three layer compression as directed. ?Wound #2 - Lower Leg Wound Laterality: Right, Circumferential ?Cleanser: Soap and Water (Home Health) 3 x Per Week/30 Days ?Discharge Instructions: May shower and wash wound with dial antibacterial soap and water prior to dressing change. ?Cleanser: Wound Cleanser (Home Health) 3 x Per Week/30 Days ?Discharge Instructions: Cleanse the wound with wound cleanser prior to applying a clean dressing using gauze sponges, not tissue or cotton balls. ?Peri-Wound Care: Triamcinolone 15 (g) (Home Health) 3 x Per Week/30 Days ?Discharge Instructions: Use triamcinolone 15 (g) as directed ?Peri-Wound Care: Sween Lotion (Moisturizing lotion) (Home Health) 3 x Per Week/30 Days ?Discharge Instructions: Apply moisturizing lotion as directed ?Prim Dressing: KerraCel Ag Gelling Fiber Dressing, 4x5 in (silver alginate) (Home Health) 3 x Per Week/30 Days ?ary ?Discharge Instructions: Apply silver alginate to wound bed as instructed ?Secondary Dressing: ABD Pad, 8x10 (Home Health) 3 x Per Week/30 Days ?Discharge Instructions: Apply over primary dressing as directed. ?Compression Wrap: ThreePress (3 layer compression wrap) (Home Health) 3 x Per Week/30 Days ?Discharge Instructions: Apply three layer compression as directed. ?Electronic Signature(s) ?Signed: 06/06/2021 4:45:02 PM By: Lorrin Jackson ?Signed: 06/27/2021 12:02:04 PM By: Kalman Shan DO ?Previous Signature: 05/31/2021 11:45:20 AM Version By: Kalman Shan DO ?Entered By: Lorrin Jackson on 06/06/2021 16:28:13 ?-------------------------------------------------------------------------------- ?Problem List Details ?Patient Name: ?Date of Service: ?[name], [name] 05/31/2021 10:15 A M ?Medical Record Number: [id number] ?Patient Account Number: [id number] ?Date of Birth/Sex: ?Treating RN: ?07-Nov-1935 (85 y.o. [name] ?Primary Care Provider: Milagros Evener R ?Other Clinician: ?Referring Provider: ?Treating Provider/Extender: Kalman Shan ?[place], [place] ?Weeks in Treatment: 1 ?Active Problems ?ICD-10 ?Encou

## 2021-06-13 ENCOUNTER — Telehealth: Payer: Self-pay

## 2021-06-13 NOTE — Telephone Encounter (Signed)
Patient called, advised AF clinic referral and agreeable to plan. ?

## 2021-06-13 NOTE — Telephone Encounter (Signed)
Reached out to patient left voicemail with telephone number. ?

## 2021-06-13 NOTE — Telephone Encounter (Signed)
Repeat Abbott alert for AT/AF ?Presenting rhythm AF/VS-VP, ongoing from 3/24 @ 14:15. Burden 9.3%, Eliquis. ? ?Patient reports of during the beginning of AF onset she felt dizzy, had hospital admission for symptomatic anemia.  States she received blood transfusions while in hospital. Reports she was taken off Eliquis while admitted then restarted after she went home. States she is not 100% back to herself but feeling much better. Advised I will forward to Dr. Curt Bears for review and we will call with any changes. Voiced understanding. ? ? ? ? ? ? ? ?

## 2021-06-17 ENCOUNTER — Ambulatory Visit (HOSPITAL_COMMUNITY)
Admission: RE | Admit: 2021-06-17 | Discharge: 2021-06-17 | Disposition: A | Discharge status: Home / Self Care | Payer: Medicare Other | Source: Ambulatory Visit | Attending: Nurse Practitioner | Admitting: Nurse Practitioner

## 2021-06-17 ENCOUNTER — Encounter (HOSPITAL_COMMUNITY): Payer: Self-pay | Admitting: Nurse Practitioner

## 2021-06-17 VITALS — BP 124/50 | HR 70 | Ht 67.0 in | Wt 247.0 lb

## 2021-06-17 DIAGNOSIS — Z86711 Personal history of pulmonary embolism: Secondary | ICD-10-CM | POA: Insufficient documentation

## 2021-06-17 DIAGNOSIS — Z86718 Personal history of other venous thrombosis and embolism: Secondary | ICD-10-CM | POA: Insufficient documentation

## 2021-06-17 DIAGNOSIS — I442 Atrioventricular block, complete: Secondary | ICD-10-CM | POA: Insufficient documentation

## 2021-06-17 DIAGNOSIS — I10 Essential (primary) hypertension: Secondary | ICD-10-CM | POA: Insufficient documentation

## 2021-06-17 DIAGNOSIS — Z87891 Personal history of nicotine dependence: Secondary | ICD-10-CM | POA: Insufficient documentation

## 2021-06-17 DIAGNOSIS — I4891 Unspecified atrial fibrillation: Secondary | ICD-10-CM | POA: Insufficient documentation

## 2021-06-17 DIAGNOSIS — Z8249 Family history of ischemic heart disease and other diseases of the circulatory system: Secondary | ICD-10-CM | POA: Diagnosis not present

## 2021-06-17 DIAGNOSIS — Z7901 Long term (current) use of anticoagulants: Secondary | ICD-10-CM | POA: Diagnosis not present

## 2021-06-17 DIAGNOSIS — Z950 Presence of cardiac pacemaker: Secondary | ICD-10-CM | POA: Diagnosis not present

## 2021-06-17 LAB — CBC
HCT: 26.8 % — ABNORMAL LOW (ref 36.0–46.0)
Hemoglobin: 7.5 g/dL — ABNORMAL LOW (ref 12.0–15.0)
MCH: 22.7 pg — ABNORMAL LOW (ref 26.0–34.0)
MCHC: 28 g/dL — ABNORMAL LOW (ref 30.0–36.0)
MCV: 81 fL (ref 80.0–100.0)
Platelets: 268 10*3/uL (ref 150–400)
RBC: 3.31 MIL/uL — ABNORMAL LOW (ref 3.87–5.11)
RDW: 21.3 % — ABNORMAL HIGH (ref 11.5–15.5)
WBC: 8.1 10*3/uL (ref 4.0–10.5)
nRBC: 0.2 % (ref 0.0–0.2)

## 2021-06-17 LAB — BASIC METABOLIC PANEL
Anion gap: 7 (ref 5–15)
BUN: 16 mg/dL (ref 8–23)
CO2: 26 mmol/L (ref 22–32)
Calcium: 8.7 mg/dL — ABNORMAL LOW (ref 8.9–10.3)
Chloride: 109 mmol/L (ref 98–111)
Creatinine, Ser: 1.16 mg/dL — ABNORMAL HIGH (ref 0.44–1.00)
GFR, Estimated: 46 mL/min — ABNORMAL LOW (ref >60)
Glucose, Bld: 132 mg/dL — ABNORMAL HIGH (ref 70–99)
Potassium: 3.6 mmol/L (ref 3.5–5.1)
Sodium: 142 mmol/L (ref 135–145)

## 2021-06-17 NOTE — Patient Instructions (Signed)
Cardioversion scheduled for Monday, April 24th ? - Arrive at the [facility] and go to admitting at 7am ? - Do not eat or drink anything after midnight the night prior to your procedure. ? - Take all your morning medication (except diabetic medications) with a sip of water prior to arrival. ? - You will not be able to drive home after your procedure. ? - Do NOT miss any doses of your blood thinner - if you should miss a dose please notify our office immediately. ? - If you feel as if you go back into normal rhythm prior to scheduled cardioversion, please notify our office immediately. If your procedure is canceled in the cardioversion suite you will be charged a cancellation fee. ? ?

## 2021-06-17 NOTE — Progress Notes (Signed)
? ?Primary Care Physician: Aretta Nip, MD ?Referring Physician: [facility] f/u ?EP: Dr. Curt Bears ?Cardiologist: Dr. Oval Linsey ? ? ?[name] is a 86 y.o. female with a h/o HTN,  CHB s/p PPM, unprovoked DVT/PE, on eliquis 5 mg bid for many years, severe AS, s/p TAVR, recent symptomatic anemia with hemoglobin  of 4.7 with hospitalization  at [facility] 3/22 to 3/26. 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  GI saw the patient and patient was restarted on Eliquis  prior to d/c. Pt recently had a f/u with PCP and hgb was 8.  ? ?She is now in the afib clinic as  ongoing persistent new onset  afib, noted since 3/24.  She is tired. She has chronic LLE. She currently has dressings in [facility] weeping ulcers [facility].  This has been an issue in the past as well. Her niece is with her today and she is planning on moving her aunt and her mother (currently living together in [place])  to an assisted care in [place] in the next 1-2 weeks. The niece lives in [place] and this move will make it easier for her to manage their care. She  already has  a cardiology appointment in the near future to establish there.  ? ?Today, she denies symptoms of palpitations, chest pain, shortness of breath, orthopnea, PND, lower extremity edema, dizziness, presyncope, syncope, or neurologic sequela. The patient is tolerating medications without difficulties and is otherwise without complaint today.  ? ?Past Medical History:  ?Diagnosis Date  ? Anxiety   ? Aortic stenosis   ? Arthritis   ? Breast cancer ([place])   ? CANCER  ? Diabetes mellitus   ? Type II  ? Dyspnea   ? Heart murmur   ? History of chemotherapy   ? History of kidney stones   ? Hypertension   ? Mitral stenosis   ? Morbid obesity ([place])   ? BMI > 40  ? Ovarian cancer ([place])   ? Perforated duodenal ulcer ([place]) 03/23/2018  ? Presence of permanent cardiac  pacemaker   ? St. Jude  ? Pulmonary embolism ([place]) 2013  ? post op  ? 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  ? 26 mm Edwards Sapien 3 transcatheter heart valve placed via percutaneous right transfemoral approach   ? Spinal stenosis   ? ?Past Surgical History:  ?Procedure Laterality Date  ? ABDOMINAL HYSTERECTOMY  2013  ? BREAST SURGERY  03-2004  ? lumpectomy   ? CARDIAC CATHETERIZATION    ? COLONOSCOPY WITH PROPOFOL N/A 05/27/2021  ? Procedure: COLONOSCOPY WITH PROPOFOL;  Surgeon: Wilford Corner, MD;  Location: [place];  Service: Gastroenterology;  Laterality: N/A;  ? ESOPHAGOGASTRODUODENOSCOPY N/A 05/26/2021  ? Procedure: ESOPHAGOGASTRODUODENOSCOPY (EGD);  Surgeon: Wilford Corner, MD;  Location: [place];  Service: Gastroenterology;  Laterality: N/A;  ? GIVENS CAPSULE STUDY N/A 05/27/2021  ? Procedure: GIVENS CAPSULE STUDY;  Surgeon: Wilford Corner, MD;  Location: [place];  Service: Gastroenterology;  Laterality: N/A;  ? INSERT / REPLACE / REMOVE PACEMAKER    ? INTRAOPERATIVE TRANSTHORACIC ECHOCARDIOGRAM  07/17/2017  ? Procedure: INTRAOPERATIVE TRANSTHORACIC ECHOCARDIOGRAM;  Surgeon: Sherren Mocha, MD;  Location: [place];  Service: Open Heart Surgery;;  ? LAPAROTOMY N/A 03/22/2018  ? Procedure: grahams pouch, repair of bowel perforation;  Surgeon: Kinsinger, Arta Bruce, MD;  Location: [place];  Service: General;  Laterality: N/A;  ? LITHOTRIPSY    ? PACEMAKER IMPLANT N/A 04/21/2016  ? Procedure: Pacemaker Implant;  Surgeon: Will Meredith Leeds, MD;  Location: [place] [facility];  Service: Cardiovascular;  Laterality: N/A;  ? POLYPECTOMY  05/27/2021  ? Procedure: POLYPECTOMY;  Surgeon: Wilford Corner, MD;  Location: [place];  Service: Gastroenterology;;  ? TONSILLECTOMY    ? TRANSCATHETER AORTIC VALVE REPLACEMENT, TRANSFEMORAL N/A 07/17/2017  ? Procedure: TRANSCATHETER AORTIC VALVE REPLACEMENT, TRANSFEMORAL;  Surgeon: Sherren Mocha, MD;  Location: [place];  Service: Open  Heart Surgery;  Laterality: N/A;  ? ? ?Current Outpatient Medications  ?Medication Sig Dispense Refill  ? acetaminophen (TYLENOL) 650 MG CR tablet Take 1,300 mg by mouth in the morning and at bedtime.    ? apixaban (ELIQUIS) 5 MG TABS tablet Take 1 tablet (5 mg total) by mouth 2 (two) times daily. 180 tablet 3  ? atorvastatin (LIPITOR) 20 MG tablet Take 20 mg by mouth at bedtime.     ? b complex vitamins capsule Take 1 capsule by mouth daily.    ? Calcium Citrate-Vitamin D (CALCIUM CITRATE + PO) Take 1 tablet by mouth 2 (two) times daily.     ? cholecalciferol (VITAMIN D) 1000 units tablet Take 1,000 Units by mouth 2 (two) times daily.     ? clotrimazole (LOTRIMIN) 1 % cream Apply to scales on lower extremities once daily until resolved 85 g 2  ? ferrous sulfate 325 (65 FE) MG tablet Take 325 mg by mouth every evening.    ? fish oil-omega-3 fatty acids 1000 MG capsule Take 1 g by mouth every evening.     ? FLUoxetine (PROZAC) 40 MG capsule Take 40 mg by mouth daily.     ? furosemide (LASIX) 40 MG tablet TAKE 1 TABLET TWICE DAILY  ON MONDAY, WEDNESDAY,  FRIDAY, &amp; SUNDAY AND 1 TAB  ONCE DAILY ON TUESDAY,  THURSDAY,&amp; SATURDAY (Patient taking differently: Take 40-80 mg by mouth See admin instructions. TAKE 1 TABLET TWICE DAILY  ON MONDAY, WEDNESDAY,  FRIDAY, & SUNDAY AND 1 TAB  ONCE DAILY ON TUESDAY,  THURSDAY,& SATURDAY) 143 tablet 3  ? LEVEMIR FLEXTOUCH 100 UNIT/ML Pen Inject 60-70 Units into the skin 2 (two) times daily. Per sliding scale    ? Magnesium 400 MG CAPS Take 400 mg by mouth at bedtime.     ? metFORMIN (GLUCOPHAGE) 1000 MG tablet Take 1,000 mg by mouth 2 (two) times daily with a meal.     ? Multiple Vitamin (MULTIVITAMIN) tablet Take 1 tablet by mouth daily.      ? olmesartan (BENICAR) 5 MG tablet Take 5 mg by mouth daily.    ? pantoprazole (PROTONIX) 40 MG tablet Take 40 mg by mouth 2 (two) times daily.    ? saccharomyces boulardii (FLORASTOR) 250 MG capsule Take 1 capsule (250 mg total) by mouth  2 (two) times daily. You can find a probiotic over the counter. Take this while you are on antibiotics.    ? vitamin C (ASCORBIC ACID) 250 MG tablet Take 1 tablet (250 mg total) by mouth daily.    ? ?No current facility-administered medications for this encounter.  ? ? ?Allergies  ?Allergen Reactions  ? Nsaids Other (See Comments)  ?  Peptic ulcer  ? Tape Rash  ?  CLEAR PLASTIC TAPE  ? ? ?Social History  ? ?Socioeconomic History  ? Marital status: Widowed  ?  Spouse name: Not on file  ? Number of children: Not on file  ?  Years of education: Not on file  ? Highest education level: High school graduate  ?Occupational History  ? Not on file  ?Tobacco Use  ? Smoking status: Former  ?  Packs/day: 1.00  ?  Years: 5.00  ?  Pack years: 5.00  ?  Types: Cigarettes  ? Smokeless tobacco: Never  ? Tobacco comments:  ?  Former smoker 06/17/21  ?Vaping Use  ? Vaping Use: Never used  ?Substance and Sexual Activity  ? Alcohol use: No  ? Drug use: No  ? Sexual activity: Not on file  ?Other Topics Concern  ? Not on file  ?Social History Narrative  ? Not on file  ? ?Social Determinants of Health  ? ?Financial Resource Strain: Not on file  ?Food Insecurity: Not on file  ?Transportation Needs: Not on file  ?Physical Activity: Not on file  ?Stress: Not on file  ?Social Connections: Not on file  ?Intimate Partner Violence: Not on file  ? ? ?Family History  ?Problem Relation Age of Onset  ? Bladder Cancer Father   ? Cancer Brother   ? Cancer Sister   ? Heart failure Mother   ? ? ?ROS- All systems are reviewed and negative except as per the HPI above ? ?Physical Exam: ?Vitals:  ? 06/17/21 0839  ?BP: (!) 124/50  ?Pulse: 70  ?Weight: 112 kg  ?Height: '5\' 7"'$  (1.702 m)  ? ?Wt Readings from Last 3 Encounters:  ?06/17/21 112 kg  ?05/29/21 110.6 kg  ?11/25/20 117.9 kg  ? ? ?Labs: ?Lab Results  ?Component Value Date  ? NA 139 05/29/2021  ? K 3.6 05/29/2021  ? CL 107 05/29/2021  ? CO2 25 05/29/2021  ? GLUCOSE 145 (H) 05/29/2021  ? BUN 12 05/29/2021  ?  CREATININE 1.04 (H) 05/29/2021  ? CALCIUM 8.6 (L) 05/29/2021  ? MG 1.6 (L) 05/29/2021  ? ?Lab Results  ?Component Value Date  ? INR 1.8 (H) 05/24/2021  ? ?Lab Results  ?Component Value Date  ? CHOL 131

## 2021-06-20 ENCOUNTER — Encounter (HOSPITAL_BASED_OUTPATIENT_CLINIC_OR_DEPARTMENT_OTHER): Payer: Self-pay

## 2021-06-20 ENCOUNTER — Encounter: Payer: Self-pay | Admitting: Podiatry

## 2021-06-20 ENCOUNTER — Encounter: Payer: Self-pay | Admitting: Cardiology

## 2021-06-20 ENCOUNTER — Encounter: Payer: Self-pay | Admitting: Internal Medicine

## 2021-06-21 ENCOUNTER — Telehealth: Payer: Self-pay

## 2021-06-21 ENCOUNTER — Encounter (HOSPITAL_BASED_OUTPATIENT_CLINIC_OR_DEPARTMENT_OTHER): Payer: Medicare Other | Admitting: Internal Medicine

## 2021-06-21 ENCOUNTER — Ambulatory Visit (HOSPITAL_BASED_OUTPATIENT_CLINIC_OR_DEPARTMENT_OTHER): Payer: Medicare Other | Admitting: Family

## 2021-06-21 NOTE — Telephone Encounter (Signed)
Willadeen Admire "MARY ANN" ?to [employer] [facility]riage (supporting [place], Ocie Doyne, MD)   ? 1:52 PM ?hi there- my aunt Rewa Weissberg passed away over the weekend. We found her on the floor. It is a suspected heart attack...she had a pacemaker transmitter in her home. Would that unit have logged her suspected event? And also, what do I do with the box? Please advise. Howie Ill [phone] - POA for [place] ? ?Per review of last transmission Pt had high ventricular rate June 18, 2021 at 5:48 pm. ? ?Will forward to Dr. Curt Bears for review. ? ? ?

## 2021-06-27 ENCOUNTER — Ambulatory Visit (HOSPITAL_COMMUNITY): Admit: 2021-06-27 | Payer: Medicare Other | Admitting: Cardiology

## 2021-06-27 ENCOUNTER — Encounter (HOSPITAL_COMMUNITY): Payer: Self-pay

## 2021-06-27 SURGERY — CARDIOVERSION
Anesthesia: General

## 2021-07-01 ENCOUNTER — Ambulatory Visit: Payer: Medicare Other | Admitting: Podiatry

## 2021-07-04 DEATH — deceased

## 2021-09-02 ENCOUNTER — Ambulatory Visit: Payer: Medicare Other | Admitting: Podiatry

## 2021-11-04 ENCOUNTER — Ambulatory Visit: Payer: Medicare Other | Admitting: Podiatry

## 2022-01-06 ENCOUNTER — Ambulatory Visit: Payer: Medicare Other | Admitting: Podiatry
